# Patient Record
Sex: Female | Born: 1935
Health system: Southern US, Community
[De-identification: ages and names within clinical notes are randomized; demographics above are authoritative.]

## PROBLEM LIST (undated history)

## (undated) ENCOUNTER — Emergency Department (HOSPITAL_COMMUNITY): Admission: EM | Payer: Medicare Other | Source: Home / Self Care

## (undated) ENCOUNTER — Emergency Department (HOSPITAL_BASED_OUTPATIENT_CLINIC_OR_DEPARTMENT_OTHER): Payer: Managed Care, Other (non HMO) | Source: Home / Self Care

## (undated) DIAGNOSIS — R2681 Unsteadiness on feet: Secondary | ICD-10-CM

## (undated) DIAGNOSIS — K219 Gastro-esophageal reflux disease without esophagitis: Secondary | ICD-10-CM

## (undated) DIAGNOSIS — R1110 Vomiting, unspecified: Secondary | ICD-10-CM

## (undated) DIAGNOSIS — H5462 Unqualified visual loss, left eye, normal vision right eye: Secondary | ICD-10-CM

## (undated) DIAGNOSIS — C801 Malignant (primary) neoplasm, unspecified: Secondary | ICD-10-CM

## (undated) DIAGNOSIS — I5042 Chronic combined systolic (congestive) and diastolic (congestive) heart failure: Secondary | ICD-10-CM

## (undated) DIAGNOSIS — M1990 Unspecified osteoarthritis, unspecified site: Secondary | ICD-10-CM

## (undated) DIAGNOSIS — N390 Urinary tract infection, site not specified: Secondary | ICD-10-CM

## (undated) DIAGNOSIS — H3320 Serous retinal detachment, unspecified eye: Secondary | ICD-10-CM

## (undated) DIAGNOSIS — M2550 Pain in unspecified joint: Secondary | ICD-10-CM

## (undated) DIAGNOSIS — I219 Acute myocardial infarction, unspecified: Secondary | ICD-10-CM

## (undated) DIAGNOSIS — E039 Hypothyroidism, unspecified: Secondary | ICD-10-CM

## (undated) DIAGNOSIS — I440 Atrioventricular block, first degree: Secondary | ICD-10-CM

## (undated) DIAGNOSIS — I2510 Atherosclerotic heart disease of native coronary artery without angina pectoris: Secondary | ICD-10-CM

## (undated) DIAGNOSIS — E669 Obesity, unspecified: Secondary | ICD-10-CM

## (undated) DIAGNOSIS — E7800 Pure hypercholesterolemia, unspecified: Secondary | ICD-10-CM

## (undated) DIAGNOSIS — I10 Essential (primary) hypertension: Secondary | ICD-10-CM

## (undated) DIAGNOSIS — M791 Myalgia, unspecified site: Secondary | ICD-10-CM

## (undated) DIAGNOSIS — I119 Hypertensive heart disease without heart failure: Secondary | ICD-10-CM

## (undated) DIAGNOSIS — I77810 Thoracic aortic ectasia: Secondary | ICD-10-CM

## (undated) DIAGNOSIS — C50919 Malignant neoplasm of unspecified site of unspecified female breast: Secondary | ICD-10-CM

## (undated) DIAGNOSIS — R32 Unspecified urinary incontinence: Secondary | ICD-10-CM

## (undated) DIAGNOSIS — K922 Gastrointestinal hemorrhage, unspecified: Secondary | ICD-10-CM

## (undated) HISTORY — PX: NECK SURGERY: SHX720

## (undated) HISTORY — DX: Unsteadiness on feet: R26.81

## (undated) HISTORY — DX: Unspecified osteoarthritis, unspecified site: M19.90

## (undated) HISTORY — DX: Hypertensive heart disease without heart failure: I11.9

## (undated) HISTORY — PX: CATARACT EXTRACTION, BILATERAL: SHX1313

## (undated) HISTORY — DX: Obesity, unspecified: E66.9

## (undated) HISTORY — DX: Malignant (primary) neoplasm, unspecified: C80.1

## (undated) HISTORY — PX: BREAST BIOPSY: SHX20

## (undated) HISTORY — DX: Pure hypercholesterolemia, unspecified: E78.00

## (undated) HISTORY — DX: Pain in unspecified joint: M25.50

## (undated) HISTORY — DX: Urinary tract infection, site not specified: N39.0

## (undated) HISTORY — PX: FOOT SURGERY: SHX648

## (undated) HISTORY — DX: Serous retinal detachment, unspecified eye: H33.20

## (undated) HISTORY — DX: Hypothyroidism, unspecified: E03.9

## (undated) HISTORY — DX: Gastrointestinal hemorrhage, unspecified: K92.2

## (undated) HISTORY — DX: Gastro-esophageal reflux disease without esophagitis: K21.9

## (undated) HISTORY — PX: BREAST LUMPECTOMY: SHX2

## (undated) HISTORY — PX: CERVICAL LAMINECTOMY: SHX94

## (undated) HISTORY — DX: Myalgia, unspecified site: M79.10

## (undated) HISTORY — DX: Essential (primary) hypertension: I10

## (undated) HISTORY — DX: Vomiting, unspecified: R11.10

---
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
## 1975-03-14 HISTORY — PX: ABDOMINAL HYSTERECTOMY: SHX81

## 1978-03-13 DIAGNOSIS — K922 Gastrointestinal hemorrhage, unspecified: Secondary | ICD-10-CM

## 1978-03-13 HISTORY — DX: Gastrointestinal hemorrhage, unspecified: K92.2

## 1998-12-21 ENCOUNTER — Ambulatory Visit (HOSPITAL_COMMUNITY): Admission: RE | Admit: 1998-12-21 | Discharge: 1998-12-21 | Payer: Self-pay | Admitting: Gastroenterology

## 1999-10-20 ENCOUNTER — Other Ambulatory Visit: Admission: RE | Admit: 1999-10-20 | Discharge: 1999-10-20 | Payer: Self-pay | Admitting: Internal Medicine

## 2002-02-20 ENCOUNTER — Ambulatory Visit (HOSPITAL_COMMUNITY): Admission: RE | Admit: 2002-02-20 | Discharge: 2002-02-20 | Payer: Self-pay | Admitting: Internal Medicine

## 2003-12-09 ENCOUNTER — Ambulatory Visit (HOSPITAL_BASED_OUTPATIENT_CLINIC_OR_DEPARTMENT_OTHER): Admission: RE | Admit: 2003-12-09 | Discharge: 2003-12-09 | Payer: Self-pay | Admitting: General Surgery

## 2003-12-09 ENCOUNTER — Encounter (INDEPENDENT_AMBULATORY_CARE_PROVIDER_SITE_OTHER): Payer: Self-pay | Admitting: *Deleted

## 2003-12-09 ENCOUNTER — Ambulatory Visit (HOSPITAL_COMMUNITY): Admission: RE | Admit: 2003-12-09 | Discharge: 2003-12-09 | Payer: Self-pay | Admitting: General Surgery

## 2004-11-22 ENCOUNTER — Encounter: Admission: RE | Admit: 2004-11-22 | Discharge: 2004-11-22 | Payer: Self-pay | Admitting: Specialist

## 2004-12-09 ENCOUNTER — Encounter: Admission: RE | Admit: 2004-12-09 | Discharge: 2004-12-09 | Payer: Self-pay | Admitting: Specialist

## 2005-01-10 ENCOUNTER — Encounter: Admission: RE | Admit: 2005-01-10 | Discharge: 2005-01-10 | Payer: Self-pay | Admitting: Specialist

## 2005-12-21 ENCOUNTER — Encounter: Admission: RE | Admit: 2005-12-21 | Discharge: 2005-12-21 | Payer: Self-pay | Admitting: Internal Medicine

## 2008-10-29 DIAGNOSIS — M169 Osteoarthritis of hip, unspecified: Secondary | ICD-10-CM | POA: Insufficient documentation

## 2008-10-29 DIAGNOSIS — E039 Hypothyroidism, unspecified: Secondary | ICD-10-CM | POA: Insufficient documentation

## 2008-10-29 DIAGNOSIS — I10 Essential (primary) hypertension: Secondary | ICD-10-CM | POA: Insufficient documentation

## 2008-10-29 DIAGNOSIS — K219 Gastro-esophageal reflux disease without esophagitis: Secondary | ICD-10-CM | POA: Insufficient documentation

## 2009-01-02 ENCOUNTER — Encounter: Admission: RE | Admit: 2009-01-02 | Discharge: 2009-01-02 | Payer: Self-pay | Admitting: Specialist

## 2010-03-13 HISTORY — PX: TOTAL HIP ARTHROPLASTY: SHX124

## 2010-06-22 ENCOUNTER — Encounter: Payer: Self-pay | Admitting: Cardiology

## 2010-06-22 DIAGNOSIS — N390 Urinary tract infection, site not specified: Secondary | ICD-10-CM | POA: Insufficient documentation

## 2010-06-22 DIAGNOSIS — E663 Overweight: Secondary | ICD-10-CM | POA: Insufficient documentation

## 2010-06-22 DIAGNOSIS — H3320 Serous retinal detachment, unspecified eye: Secondary | ICD-10-CM | POA: Insufficient documentation

## 2010-06-22 DIAGNOSIS — M791 Myalgia, unspecified site: Secondary | ICD-10-CM | POA: Insufficient documentation

## 2010-06-22 DIAGNOSIS — M1990 Unspecified osteoarthritis, unspecified site: Secondary | ICD-10-CM | POA: Insufficient documentation

## 2010-06-22 DIAGNOSIS — R1110 Vomiting, unspecified: Secondary | ICD-10-CM | POA: Insufficient documentation

## 2010-06-22 DIAGNOSIS — M2550 Pain in unspecified joint: Secondary | ICD-10-CM | POA: Insufficient documentation

## 2010-06-22 DIAGNOSIS — R2681 Unsteadiness on feet: Secondary | ICD-10-CM | POA: Insufficient documentation

## 2010-06-22 DIAGNOSIS — I119 Hypertensive heart disease without heart failure: Secondary | ICD-10-CM | POA: Insufficient documentation

## 2010-06-22 DIAGNOSIS — E7800 Pure hypercholesterolemia, unspecified: Secondary | ICD-10-CM | POA: Insufficient documentation

## 2010-06-22 DIAGNOSIS — K922 Gastrointestinal hemorrhage, unspecified: Secondary | ICD-10-CM | POA: Insufficient documentation

## 2010-06-22 DIAGNOSIS — E039 Hypothyroidism, unspecified: Secondary | ICD-10-CM | POA: Insufficient documentation

## 2010-06-22 DIAGNOSIS — I110 Hypertensive heart disease with heart failure: Secondary | ICD-10-CM | POA: Insufficient documentation

## 2010-06-22 DIAGNOSIS — K219 Gastro-esophageal reflux disease without esophagitis: Secondary | ICD-10-CM | POA: Insufficient documentation

## 2010-06-23 ENCOUNTER — Encounter: Payer: Self-pay | Admitting: Cardiology

## 2010-06-23 ENCOUNTER — Ambulatory Visit (INDEPENDENT_AMBULATORY_CARE_PROVIDER_SITE_OTHER): Payer: Medicare Other | Admitting: Cardiology

## 2010-06-23 DIAGNOSIS — R0609 Other forms of dyspnea: Secondary | ICD-10-CM

## 2010-06-23 DIAGNOSIS — E7800 Pure hypercholesterolemia, unspecified: Secondary | ICD-10-CM

## 2010-06-23 DIAGNOSIS — R0600 Dyspnea, unspecified: Secondary | ICD-10-CM

## 2010-06-23 DIAGNOSIS — I119 Hypertensive heart disease without heart failure: Secondary | ICD-10-CM

## 2010-06-23 DIAGNOSIS — I10 Essential (primary) hypertension: Secondary | ICD-10-CM

## 2010-06-23 NOTE — Assessment & Plan Note (Signed)
Overall, from a cardiology standpoint she's been doing well. She's taking diclofenac for her pain in her back and hip and I expect that is aggravating her hypertension. I advised caution. In general, she's been doing well.

## 2010-06-23 NOTE — Assessment & Plan Note (Signed)
Simvastatin has been stopped, followup per Dr. Jacky Kindle.

## 2010-06-23 NOTE — Progress Notes (Signed)
Subjective:   Kathy Murphy comes in today for followup visit. Her main issue is spinal stenosis and left hip pain. She's had right foot surgery since she was seen. She sleep deprived because of recurrent nocturia.  From a cardiology standpoint, she's had a hypertensive heart disease and hypercholesterolemia. Blood pressure in general has been well controlled.  Other medical problems include hypertensive heart disease, hypercholesterolemia, a history of detached retina, hypothyroidism, gastroesophageal reflux, and obesity. She's had a thyroidectomy for cancer in 1957 and a cervical laminectomy. Should history of GI bleed after polyp excision in 1980.  Current Outpatient Prescriptions  Medication Sig Dispense Refill  . aspirin 81 MG tablet Take 81 mg by mouth daily.        . diazepam (VALIUM) 2 MG tablet Take 2 mg by mouth every 6 (six) hours as needed.        . Diclofenac Sodium CR 100 MG 24 hr tablet Take 100 mg by mouth daily.        Marland Kitchen estrogens, conjugated, (PREMARIN) 0.9 MG tablet Take 0.9 mg by mouth daily. Take daily for 21 days then do not take for 7 days.       Marland Kitchen levothyroxine (SYNTHROID, LEVOTHROID) 137 MCG tablet Take 137 mcg by mouth daily.        Marland Kitchen losartan-hydrochlorothiazide (HYZAAR) 50-12.5 MG per tablet Take 1 tablet by mouth daily.        . nebivolol (BYSTOLIC) 10 MG tablet Take 10 mg by mouth daily.        . pantoprazole (PROTONIX) 40 MG tablet Take 40 mg by mouth daily.          No Known Allergies  Patient Active Problem List  Diagnoses  . Heart disease, hypertensive  . Hypercholesterolemia  . Arthritis  . Unsteady gait  . Myalgia  . Pain in joints  . Retinal detachment  . Hypothyroidism  . GERD (gastroesophageal reflux disease)  . UTI (urinary tract infection)  . Vomiting  . Obesity  . GI bleed    History  Smoking status  . Never Smoker   Smokeless tobacco  . Not on file    History  Alcohol Use No    No family history on file.  Review of Systems:   The  patient denies any heat or cold intolerance.  No weight gain or weight loss.  The patient denies headaches or blurry vision.  There is no cough or sputum production.  The patient denies dizziness.  There is no hematuria or hematochezia.  The patient denies any muscle aches or arthritis.  The patient denies any rash.  The patient denies frequent falling or instability.  There is no history of depression or anxiety.  All other systems were reviewed and are negative.   Physical Exam:    Assessment / Plan:

## 2010-06-30 ENCOUNTER — Encounter: Payer: Self-pay | Admitting: Cardiology

## 2010-06-30 ENCOUNTER — Ambulatory Visit (HOSPITAL_COMMUNITY): Payer: Medicare Other | Attending: Cardiology | Admitting: Radiology

## 2010-06-30 VITALS — Ht 69.0 in | Wt 192.0 lb

## 2010-06-30 DIAGNOSIS — R0609 Other forms of dyspnea: Secondary | ICD-10-CM | POA: Insufficient documentation

## 2010-06-30 DIAGNOSIS — I440 Atrioventricular block, first degree: Secondary | ICD-10-CM

## 2010-06-30 DIAGNOSIS — I4949 Other premature depolarization: Secondary | ICD-10-CM

## 2010-06-30 DIAGNOSIS — R0600 Dyspnea, unspecified: Secondary | ICD-10-CM

## 2010-06-30 DIAGNOSIS — R0602 Shortness of breath: Secondary | ICD-10-CM

## 2010-06-30 DIAGNOSIS — R0789 Other chest pain: Secondary | ICD-10-CM

## 2010-06-30 DIAGNOSIS — R0989 Other specified symptoms and signs involving the circulatory and respiratory systems: Secondary | ICD-10-CM | POA: Insufficient documentation

## 2010-06-30 MED ORDER — TECHNETIUM TC 99M TETROFOSMIN IV KIT
11.0000 | PACK | Freq: Once | INTRAVENOUS | Status: AC | PRN
  Administered 2010-06-30: 11 via INTRAVENOUS

## 2010-06-30 MED ORDER — TECHNETIUM TC 99M TETROFOSMIN IV KIT
33.0000 | PACK | Freq: Once | INTRAVENOUS | Status: AC | PRN
  Administered 2010-06-30: 33 via INTRAVENOUS

## 2010-06-30 MED ORDER — REGADENOSON 0.4 MG/5ML IV SOLN
0.4000 mg | Freq: Once | INTRAVENOUS | Status: AC
  Administered 2010-06-30: 0.4 mg via INTRAVENOUS

## 2010-06-30 NOTE — Progress Notes (Signed)
[facility] SITE 3 NUCLEAR MED [address] [place] [address] [phone]  Cardiology Nuclear Med Study  Kathy Murphy is a 77 y.o. female [id number] 08-20-1927   Nuclear Med Background Indication for Stress Test:  Evaluation for Ischemia History:  No previous documented CAD Cardiac Risk Factors: Hypertension, Lipids and Obesity  Symptoms:  Chest Pressure.  (last date of chest discomfort- months ago)   Nuclear Pre-Procedure Caffeine/Decaff Intake:  None NPO After: 11:30pm   Lungs:  clear IV 0.9% NS with Angio Cath:  22g  IV Site: R Hand  IV Started by:  Bonnita Levan, RN  Chest Size (in):  40 Cup Size: C  Height: 5\' 9"  (1.753 m)  Weight:  192 lb (87.091 kg)  BMI:  Body mass index is 28.35 kg/(m^2). Tech Comments:  Public librarian Med Study 1 or 2 day study: 1 day  Stress Test Type:  Lexiscan  Reading MD: Olga Millers, MD  Order Authorizing Provider:  Dr. Roger Shelter  Resting Radionuclide: Technetium 41m Tetrofosmin  Resting Radionuclide Dose: 11.0 mCi   Stress Radionuclide:  Technetium 22m Tetrofosmin  Stress Radionuclide Dose: 33.0 mCi           Stress Protocol Rest HR: 65 Stress HR: 90  Rest BP: 162/94 Stress BP: 146/63  Exercise Time (min): n/a METS: n/a     % Max HR: 64.29 bpm    Dose of Adenosine (mg):  n/a Dose of Lexiscan: 0.4 mg  Dose of Atropine (mg): n/a Dose of Dobutamine: n/a mcg/kg/min (at max HR)  Stress Test Technologist: Frederick Peers, EMT-P  Nuclear Technologist:  Doyne Keel, CNMT     Rest Procedure:  Myocardial perfusion imaging was performed at rest 45 minutes following the intravenous administration of Technetium 53m Tetrofosmin. Rest ECG: SR 1AVB  Stress Procedure:  The patient received IV Lexiscan 0.4 mg over 15-seconds.  Technetium 61m Tetrofosmin injected at 30-seconds.  There were no significant changes with Lexiscan/pt with frequent PVCs.  Quantitative spect images were obtained after a 45  minute delay. Stress ECG: No significant ST segment change suggestive of ischemia.  QPS Raw Data Images:  Acquisition technically good; normal left ventricular size. Stress Images:  There is decreased uptake in the apex. Rest Images:  There is decreased uptake in the apex. Subtraction (SDS):  No evidence of ischemia. Transient Ischemic Dilatation (Normal <1.22):  0.93 Lung/Heart Ratio (Normal <0.45):  0.32   Quantitative Gated Spect Images QGS EDV:  94 ml QGS ESV:  28 ml QGS cine images:  Normal Wall Motion QGS EF: 70%  Impression Exercise Capacity:  Lexiscan with no exercise. BP Response:  Normal blood pressure response. Clinical Symptoms:  No chest pain. ECG Impression:  No significant ST segment change suggestive of ischemia. Comparison with Prior Nuclear Study: No images to compare  Overall Impression:  Normal stress nuclear study with apical thinning but no ischemia.    Olga Millers

## 2010-07-04 ENCOUNTER — Encounter (HOSPITAL_COMMUNITY): Payer: Self-pay | Admitting: Cardiology

## 2010-07-04 NOTE — Progress Notes (Signed)
COPY SENT TO DR. Deborah Chalk.Mirna Mires

## 2010-07-05 NOTE — Progress Notes (Signed)
Normal study

## 2010-07-08 ENCOUNTER — Telehealth: Payer: Self-pay | Admitting: *Deleted

## 2010-07-08 NOTE — Telephone Encounter (Signed)
Pt notified of nuclear results.   

## 2010-07-08 NOTE — Progress Notes (Signed)
Pt notified of nuclear results.   

## 2010-07-29 NOTE — Op Note (Signed)
NAME:  Kathy Murphy, Kathy Murphy NO.:  [id number]   MEDICAL RECORD NO.:  [id number]          PATIENT TYPE:  AMB   LOCATION:  DSC                          FACILITY:  [facility]   PHYSICIAN:  Rose Phi. Maple Hudson, M.D.   DATE OF BIRTH:  08-09-1927   DATE OF PROCEDURE:  12/09/2003  DATE OF DISCHARGE:                                 OPERATIVE REPORT   PREOPERATIVE DIAGNOSIS:  Ductal papilloma of the right breast.   POSTOPERATIVE DIAGNOSIS:  Ductal papilloma of the right breast.   OPERATION PERFORMED:  Excision of ductal papilloma of the right breast.   SURGEON:  Rose Phi. Maple Hudson, M.D.   ANESTHESIA:  MAC.   DESCRIPTION OF PROCEDURE:  Patient placed on the operating table with the  arms extended on the arm board and the right breast prepped and draped in  the usual fashion.  The nodule with the area of discharge was centered at  about the 5:30 position along the areolar margin.  A circumareolar incision  centered on that position was then outlined with a marking pencil and the  area thoroughly infiltrated with a local anesthetic mixture.   The incision was made and then I raised an areolar flap up to where the duct  entered the nipple where there was some discoloration.  We then excised this  area.  Hemostasis was obtained with a cautery.  Subcuticular closure with 4-  0 Monocryl and skin glue was then carried out.  Dressing applied.  The  patient was then transferred to the recovery room in satisfactory condition  having tolerated the procedure well.      Pete   PRY/MEDQ  D:  12/09/2003  T:  12/09/2003  Job:  [id number]

## 2010-08-15 ENCOUNTER — Other Ambulatory Visit: Payer: Self-pay | Admitting: Internal Medicine

## 2010-08-15 DIAGNOSIS — Q762 Congenital spondylolisthesis: Secondary | ICD-10-CM | POA: Insufficient documentation

## 2010-08-19 ENCOUNTER — Ambulatory Visit
Admission: RE | Admit: 2010-08-19 | Discharge: 2010-08-19 | Disposition: A | Discharge status: Home / Self Care | Payer: Medicare Other | Source: Ambulatory Visit | Attending: Internal Medicine | Admitting: Internal Medicine

## 2010-08-19 DIAGNOSIS — Q762 Congenital spondylolisthesis: Secondary | ICD-10-CM

## 2010-09-08 ENCOUNTER — Other Ambulatory Visit: Payer: Self-pay | Admitting: Internal Medicine

## 2010-09-08 DIAGNOSIS — R9389 Abnormal findings on diagnostic imaging of other specified body structures: Secondary | ICD-10-CM

## 2010-09-09 ENCOUNTER — Ambulatory Visit
Admission: RE | Admit: 2010-09-09 | Discharge: 2010-09-09 | Disposition: A | Discharge status: Home / Self Care | Payer: Medicare Other | Source: Ambulatory Visit | Attending: Internal Medicine | Admitting: Internal Medicine

## 2010-09-09 DIAGNOSIS — R9389 Abnormal findings on diagnostic imaging of other specified body structures: Secondary | ICD-10-CM

## 2010-09-09 MED ORDER — IOHEXOL 300 MG/ML  SOLN
75.0000 mL | Freq: Once | INTRAMUSCULAR | Status: AC | PRN
  Administered 2010-09-09: 75 mL via INTRAVENOUS

## 2010-11-28 ENCOUNTER — Other Ambulatory Visit: Payer: Self-pay | Admitting: Orthopaedic Surgery

## 2010-11-28 ENCOUNTER — Encounter (HOSPITAL_COMMUNITY): Payer: Medicare Other

## 2010-11-28 LAB — URINALYSIS, ROUTINE W REFLEX MICROSCOPIC
Glucose, UA: NEGATIVE mg/dL
Ketones, ur: NEGATIVE mg/dL
Leukocytes, UA: NEGATIVE
Nitrite: NEGATIVE
Protein, ur: NEGATIVE mg/dL
Specific Gravity, Urine: 1.03 (ref 1.005–1.030)
Urobilinogen, UA: 0.2 mg/dL (ref 0.0–1.0)
pH: 5 (ref 5.0–8.0)

## 2010-11-28 LAB — CBC
HCT: 41.3 % (ref 36.0–46.0)
Hemoglobin: 13.4 g/dL (ref 12.0–15.0)
MCH: 30 pg (ref 26.0–34.0)
MCHC: 32.4 g/dL (ref 30.0–36.0)
MCV: 92.6 fL (ref 78.0–100.0)
Platelets: 234 10*3/uL (ref 150–400)
RBC: 4.46 MIL/uL (ref 3.87–5.11)
RDW: 13.1 % (ref 11.5–15.5)
WBC: 9.6 10*3/uL (ref 4.0–10.5)

## 2010-11-28 LAB — SURGICAL PCR SCREEN
MRSA, PCR: NEGATIVE
Staphylococcus aureus: POSITIVE — AB

## 2010-11-28 LAB — PROTIME-INR
INR: 0.96 (ref 0.00–1.49)
Prothrombin Time: 13 seconds (ref 11.6–15.2)

## 2010-11-28 LAB — BASIC METABOLIC PANEL
BUN: 15 mg/dL (ref 6–23)
CO2: 30 mEq/L (ref 19–32)
Calcium: 9.5 mg/dL (ref 8.4–10.5)
Chloride: 94 mEq/L — ABNORMAL LOW (ref 96–112)
Creatinine, Ser: 0.9 mg/dL (ref 0.50–1.10)
GFR calc Af Amer: >60 mL/min (ref >60)
GFR calc non Af Amer: >60 mL/min (ref >60)
Glucose, Bld: 91 mg/dL (ref 70–99)
Potassium: 4.3 mEq/L (ref 3.5–5.1)
Sodium: 134 mEq/L — ABNORMAL LOW (ref 135–145)

## 2010-11-28 LAB — URINE MICROSCOPIC-ADD ON

## 2010-12-02 ENCOUNTER — Inpatient Hospital Stay (HOSPITAL_COMMUNITY)
Admission: RE | Admit: 2010-12-02 | Discharge: 2010-12-05 | DRG: 470 | Disposition: A | Discharge status: Home Health | Payer: Medicare Other | Source: Ambulatory Visit | Attending: Orthopaedic Surgery | Admitting: Orthopaedic Surgery

## 2010-12-02 ENCOUNTER — Inpatient Hospital Stay (HOSPITAL_COMMUNITY): Payer: Medicare Other

## 2010-12-02 DIAGNOSIS — Z79899 Other long term (current) drug therapy: Secondary | ICD-10-CM

## 2010-12-02 DIAGNOSIS — Z01812 Encounter for preprocedural laboratory examination: Secondary | ICD-10-CM

## 2010-12-02 DIAGNOSIS — M169 Osteoarthritis of hip, unspecified: Principal | ICD-10-CM | POA: Diagnosis present

## 2010-12-02 DIAGNOSIS — M1610 Unilateral primary osteoarthritis, unspecified hip: Principal | ICD-10-CM | POA: Diagnosis present

## 2010-12-02 DIAGNOSIS — Z7982 Long term (current) use of aspirin: Secondary | ICD-10-CM

## 2010-12-02 DIAGNOSIS — Z91040 Latex allergy status: Secondary | ICD-10-CM

## 2010-12-02 DIAGNOSIS — Z79890 Hormone replacement therapy (postmenopausal): Secondary | ICD-10-CM

## 2010-12-02 DIAGNOSIS — I10 Essential (primary) hypertension: Secondary | ICD-10-CM | POA: Diagnosis present

## 2010-12-02 LAB — ABO/RH: ABO/RH(D): O NEG

## 2010-12-02 LAB — TYPE AND SCREEN
ABO/RH(D): O NEG
Antibody Screen: NEGATIVE

## 2010-12-03 LAB — CBC
MCV: 92.9 fL (ref 78.0–100.0)
Platelets: 213 10*3/uL (ref 150–400)
RBC: 3.52 MIL/uL — ABNORMAL LOW (ref 3.87–5.11)
RDW: 13.5 % (ref 11.5–15.5)
WBC: 12.6 10*3/uL — ABNORMAL HIGH (ref 4.0–10.5)

## 2010-12-03 LAB — BASIC METABOLIC PANEL
CO2: 27 mEq/L (ref 19–32)
Chloride: 95 mEq/L — ABNORMAL LOW (ref 96–112)
Creatinine, Ser: 0.65 mg/dL (ref 0.50–1.10)
GFR calc Af Amer: >60 mL/min (ref >60)
Potassium: 3.9 mEq/L (ref 3.5–5.1)
Sodium: 133 mEq/L — ABNORMAL LOW (ref 135–145)

## 2010-12-04 LAB — CBC
HCT: 27.8 % — ABNORMAL LOW (ref 36.0–46.0)
Hemoglobin: 9.1 g/dL — ABNORMAL LOW (ref 12.0–15.0)
MCV: 92.7 fL (ref 78.0–100.0)
RBC: 3 MIL/uL — ABNORMAL LOW (ref 3.87–5.11)
RDW: 13.6 % (ref 11.5–15.5)
WBC: 10.9 10*3/uL — ABNORMAL HIGH (ref 4.0–10.5)

## 2010-12-04 LAB — BASIC METABOLIC PANEL
BUN: 12 mg/dL (ref 6–23)
CO2: 31 mEq/L (ref 19–32)
Chloride: 94 mEq/L — ABNORMAL LOW (ref 96–112)
Creatinine, Ser: 0.71 mg/dL (ref 0.50–1.10)
GFR calc Af Amer: >60 mL/min (ref >60)
Potassium: 3.2 mEq/L — ABNORMAL LOW (ref 3.5–5.1)

## 2010-12-05 LAB — BASIC METABOLIC PANEL
BUN: 12 mg/dL (ref 6–23)
Chloride: 94 mEq/L — ABNORMAL LOW (ref 96–112)
Creatinine, Ser: 0.62 mg/dL (ref 0.50–1.10)
Glucose, Bld: 113 mg/dL — ABNORMAL HIGH (ref 70–99)
Potassium: 3.3 mEq/L — ABNORMAL LOW (ref 3.5–5.1)

## 2010-12-05 LAB — CBC
HCT: 28.4 % — ABNORMAL LOW (ref 36.0–46.0)
Hemoglobin: 9.4 g/dL — ABNORMAL LOW (ref 12.0–15.0)
MCHC: 33.1 g/dL (ref 30.0–36.0)
MCV: 92.5 fL (ref 78.0–100.0)

## 2010-12-05 NOTE — Op Note (Signed)
NAMEGERILYNN, MCCULLARS NO.:  [id number]  MEDICAL RECORD NO.:  [id number]  LOCATION:  0007                         FACILITY:  [facility]  PHYSICIAN:  Vanita Panda. Magnus Ivan, M.D.DATE OF BIRTH:  [birth year]  DATE OF PROCEDURE:  12/02/2010 DATE OF DISCHARGE:                              OPERATIVE REPORT   PREOPERATIVE DIAGNOSIS:  Severe osteoarthritis and degenerative joint disease, left hip.  POSTOPERATIVE DIAGNOSIS:  Severe osteoarthritis and degenerative joint disease, left hip.  PROCEDURE:  Left total hip arthroplasty through direct anterior approach.  IMPLANTS:  DePuy Pinn[place] Sector acetabular component, size 52; neutral +4, size 36 polyethylene liner; Corail femoral component with HA- coating, size 10 and standard offset, size 36 plus 1.5 metal head ball.  SURGEON:  Vanita Panda. Magnus Ivan, MD  ANESTHESIA:  General.  ANTIBIOTICS:  IV Ancef 2 gm.  BLOOD LOSS:  350 cc.  COMPLICATIONS:  None.  INDICATIONS:  Kathy Murphy is a very active 77 year old female with severe end-stage arthritis of her left hip.  This has been confirmed on x-rays and shows bone-on-bone wear and she has debilitating pain from this.  Due to the nature of this disease, she wishes to proceed with a total hip arthroplasty.  She understands the risks and benefits of this in detail and does wish to proceed with surgery.  PROCEDURE DESCRIPTION:  After informed consent was obtained, the appropriate left hip was marked.  She was brought to the operating room. General anesthesia was obtained while she was on the stretcher.  A Foley catheter was placed and then her feet were placed in traction boots for the Hana fracture table.  She was then placed on this table and a perineal post was placed as well.  The legs were placed in the traction devices with no traction applied.  The left hip was then prepped and draped with DuraPrep and sterile drapes.  A time-out was called and she was  identified as the correct patient and correct left hip.  I then made an incision 1 cm distal and 3 cm posterior to the anterior superior iliac spine and carried this obliquely down the leg.  I  dissected down to the tensor fascia lata and I divided the tensor fascia lata longitudinally.  I then proceeded with a direct anterior approach to the hip.  A Cobra retractor was placed around the lateral neck and then 1 medial teased up underneath the rectus femoris.  I then divided the hip capsule and placed retractors within the hip capsule.  Using an oscillating saw, a femoral neck cut was then made proximal to the lesser trochanter.  I then placed a bent Hohmann anteriorly and rectractor posteriorly to gain access to the acetabulum once the femoral head was removed, there was severe arthritis of this.  I then cleaned the acetabulum debris as well as remnants of the labrum and began reaming from size 44 up to size 51 with the last 2 reamers placed under direct fluoroscopic guidance.  I then chose a 52 acetabular component with Gription and placed this under direct fluoroscopic guidance and attempted placing this into the acetabulum and we had a solid fit. Next, attention was turned to  the femur.  We tried to once again __________ the leg and a temporary hook was placed underneath the vastus ridge and the greater trochanter.  The leg was externally rotated to 90 degrees, extended and abducted.  This allowed access to the femoral canal.  Retractors were placed medial to the calcar as well as under the greater trochanter.  I released a tissue to gain exposure and then used a box cutting guide followed by broaches and broached it up to a size 10 broach, it was felt to be solid.  I then placed a standard neck and a +1.5, 36 hip ball and reduced this into the acetabulum, bringing the leg back up and over.  There was minimal shock.  I could externally rotate her, internally rotate her, and it was  stable.  Her leg lengths were measured to be equal fluoroscopically.  I then removed the trial components and placed the real femoral component and the real 36+ 1.5 hip ball without problems and reduced this again in the acetabulum and once again, under radiographic guidance, it was felt the leg lengths were equal and her offset was equal.  I then copiously irrigated the tissue and closed the joint capsule with interrupted #1 Ethibond suture followed by a running #1 Vicryl in the tensor fascia lata.  The subcutaneous tissue was closed with interrupted 2-0 Vicryl followed by staples on the skin and a well-padded sterile dressing.  The patient was awakened, extubated, and taken off the Hana table into the recovery room in stable condition.  All final counts were correct and there were no complications noted.     Vanita Panda. Magnus Ivan, M.D.     CYB/MEDQ  D:  12/02/2010  T:  12/02/2010  Job:  [id number]  Electronically Signed by Doneen Poisson M.D. on 12/05/2010 07:13:20 PM

## 2010-12-05 NOTE — H&P (Signed)
  NAMEANN, BOHNE NO.:  [id number]  MEDICAL RECORD NO.:  [id number]  LOCATION:  1605                         FACILITY:  [facility]  PHYSICIAN:  Vanita Panda. Magnus Ivan, M.D.DATE OF BIRTH:  August 23, 1927  DATE OF ADMISSION:  12/02/2010 DATE OF DISCHARGE:                             HISTORY & PHYSICAL   CHIEF COMPLAINT:  Severe left hip pain.  HISTORY OF PRESENT ILLNESS:  Ms. Bumgardner is an 77 year old pleasant female with end-stage arthritis of the left hip.  X-rays confirmed bone- on-bone wearing with loss of her joint space.  She ambulates with a cane.  She has tried everything to get this feeling better and she has not.  She wishes to proceed with a total hip arthroplasty at this standpoint.  The risks and benefits of this have been explained to her in detail and she does wish to proceed with surgery.  PAST MEDICAL HISTORY: 1. Hypertension. 2. Thyroid disease. 3. Severe arthritis.  MEDICATIONS: 1. Synthroid. 2. Artificial tears. 3. Diclofenac. 4. Vicodin. 5. Aspirin. 6. Losartan/hydrochlorothiazide. 7. Bystolic. 8. Pantoprazole. 9. Premarin.  ALLERGIES:  No known drug allergies.  SOCIAL HISTORY:  She lives with her husband.  She does not smoke.  She is a very active individual.  REVIEW OF SYSTEMS:  Negative for chest pain, shortness of breath, fever, chills, nausea, and vomiting.  PHYSICAL EXAMINATION:  VITAL SIGNS:  She is afebrile and her vitals can be seen on her chart. GENERAL:  She is alert and oriented x3, in no acute distress. HEENT:  Normocephalic, atraumatic.  Pupils equal, round, and reactive to light.  Extraocular movements are intact. NECK:  Supple.  No JVD.  No bruits. LUNGS:  Clear to auscultation bilaterally. HEART:  Regular rate and rhythm. ABDOMEN:  Benign. EXTREMITIES:  Left hip shows severe pain with internal and external rotation.  X-rays confirm end-stage arthritis of her left hip.  ASSESSMENT:  This is an 77 year old  female with severe debilitating arthritis of her left hip.  PLAN:  We are proceed today with a direct anterior hip replacement.  She understands the risks and benefits of this in detail and does wish to proceed with surgery.     Vanita Panda. Magnus Ivan, M.D.     CYB/MEDQ  D:  12/02/2010  T:  12/02/2010  Job:  [id number]  Electronically Signed by Doneen Poisson M.D. on 12/05/2010 07:13:16 PM

## 2010-12-05 NOTE — Discharge Summary (Signed)
  NAMECATALINA, Kathy Murphy NO.:  [id number]  MEDICAL RECORD NO.:  [id number]  LOCATION:  1605                         FACILITY:  [facility]  PHYSICIAN:  Vanita Panda. Magnus Ivan, M.D.DATE OF BIRTH:  1927/08/13  DATE OF ADMISSION:  12/02/2010 DATE OF DISCHARGE:  12/05/2010                              DISCHARGE SUMMARY   ADMITTING DIAGNOSES:  Severe degenerative joint disease and osteoarthritis, left hip.  DISCHARGE DIAGNOSES:  Severe degenerative joint disease and osteoarthritis, left hip.  PROCEDURES:  Left total hip arthroplasty on December 02, 2010.  HOSPITAL COURSE:  [name] is an 77 year old female with debilitating arthritis of her left hip.  She was taken to the operating room on day of admission and underwent a left total hip arthroplasty through direct anterior approach.  She was then admitted as an inpatient to orthopedic floor.  Her hospital course was uneventful.  DISPOSITION:  Discharged to home.  DISCHARGE MEDICATIONS: 1. Percocet. 2. Robaxin. 3. Xarelto.  Home medications to continue, 1. Synthroid. 2. Artificial tears. 3. Losartan/hydrochlorothiazide. 4. Bystolic. 5. Pantoprazole. 6. Premarin.  DISCHARGE INSTRUCTIONS:  While she is at home, she will wait 3 days before getting her incisions wet in shower.  She will work with home health therapy for balance, gait training, co-ordination, and hip precautions.  We will see her back in the office in 2 weeks.     Vanita Panda. Magnus Ivan, M.D.    CYB/MEDQ  D:  12/05/2010  T:  12/05/2010  Job:  [id number]  Electronically Signed by Doneen Poisson M.D. on 12/05/2010 07:13:22 PM

## 2011-03-20 DIAGNOSIS — M545 Low back pain, unspecified: Secondary | ICD-10-CM | POA: Diagnosis not present

## 2011-03-20 DIAGNOSIS — M4120 Other idiopathic scoliosis, site unspecified: Secondary | ICD-10-CM | POA: Diagnosis not present

## 2011-03-28 DIAGNOSIS — I10 Essential (primary) hypertension: Secondary | ICD-10-CM | POA: Diagnosis not present

## 2011-03-28 DIAGNOSIS — N6459 Other signs and symptoms in breast: Secondary | ICD-10-CM | POA: Diagnosis not present

## 2011-03-29 DIAGNOSIS — N6459 Other signs and symptoms in breast: Secondary | ICD-10-CM | POA: Diagnosis not present

## 2011-04-04 DIAGNOSIS — N6459 Other signs and symptoms in breast: Secondary | ICD-10-CM | POA: Diagnosis not present

## 2011-04-06 ENCOUNTER — Encounter (INDEPENDENT_AMBULATORY_CARE_PROVIDER_SITE_OTHER): Payer: Self-pay | Admitting: Surgery

## 2011-04-17 ENCOUNTER — Other Ambulatory Visit (INDEPENDENT_AMBULATORY_CARE_PROVIDER_SITE_OTHER): Payer: Self-pay | Admitting: Surgery

## 2011-04-17 ENCOUNTER — Ambulatory Visit (INDEPENDENT_AMBULATORY_CARE_PROVIDER_SITE_OTHER): Payer: Medicare Other | Admitting: Surgery

## 2011-04-17 ENCOUNTER — Encounter (INDEPENDENT_AMBULATORY_CARE_PROVIDER_SITE_OTHER): Payer: Self-pay | Admitting: Surgery

## 2011-04-17 VITALS — BP 154/86 | HR 64 | Temp 97.8°F | Resp 18 | Ht 68.0 in | Wt 183.6 lb

## 2011-04-17 DIAGNOSIS — N6459 Other signs and symptoms in breast: Secondary | ICD-10-CM

## 2011-04-17 DIAGNOSIS — N6452 Nipple discharge: Secondary | ICD-10-CM

## 2011-04-17 NOTE — Progress Notes (Signed)
Patient ID: Kathy Murphy, female   DOB: 08-May-1927, 78 y.o.   MRN: [id number]  Chief Complaint  Patient presents with  . Breast Problem    left breast dilated duct- new pt    HPI Kathy Murphy is a 78 y.o. female.   HPIThis is a very pleasant female referred by Dr. Jacky Kindle for evaluation of nipple discharge. She had drainage from her left breast around Christmas. This was brown fluid became spontaneously. She has had no previous history of breast discharge and she denies any bloody discharge. She reports it is not drained in 2-3 weeks.  She is otherwise without complaints. She has had benign masses removed from both breasts in the past. She has no history of breast cancer.  Past Medical History  Diagnosis Date  . Heart disease, hypertensive   . Hypercholesterolemia   . Arthritis   . Unsteady gait   . Myalgia   . Pain in joints   . Retinal detachment   . Hypothyroidism   . GERD (gastroesophageal reflux disease)   . UTI (urinary tract infection)   . Vomiting   . Obesity   . GI bleed 1980    AFTER POLYP EXCISION  . Hypertension   . Cancer     thyroid    Past Surgical History  Procedure Date  . Foot surgery   . Dental surgery 1952  . Thyroidectomy 1957  . Cervical laminectomy   . Breast biopsy   . Abdominal hysterectomy 1977  . Breast lumpectomy     x5  . Neck surgery   . Total hip arthroplasty 2012    left    Family History  Problem Relation Age of Onset  . Heart disease Mother     Social History History  Substance Use Topics  . Smoking status: Never Smoker   . Smokeless tobacco: Not on file  . Alcohol Use: No    No Known Allergies  Current Outpatient Prescriptions  Medication Sig Dispense Refill  . aspirin 81 MG tablet Take 81 mg by mouth daily.        . diazepam (VALIUM) 2 MG tablet Take 2 mg by mouth every 6 (six) hours as needed.        . Diclofenac Sodium CR 100 MG 24 hr tablet Take 100 mg by mouth daily.        Marland Kitchen estrogens, conjugated,  (PREMARIN) 0.9 MG tablet Take 0.9 mg by mouth daily. Take daily for 21 days then do not take for 7 days.       Marland Kitchen levothyroxine (SYNTHROID, LEVOTHROID) 137 MCG tablet Take 137 mcg by mouth daily.        Marland Kitchen losartan (COZAAR) 100 MG tablet daily.      . nebivolol (BYSTOLIC) 10 MG tablet Take 10 mg by mouth daily.        . pantoprazole (PROTONIX) 40 MG tablet Take 40 mg by mouth daily.          Review of Systems Review of Systems  Constitutional: Negative for fever, chills and unexpected weight change.  HENT: Negative for hearing loss, congestion, sore throat, trouble swallowing and voice change.   Eyes: Negative for visual disturbance.  Respiratory: Negative for cough and wheezing.   Cardiovascular: Negative for chest pain, palpitations and leg swelling.  Gastrointestinal: Negative for nausea, vomiting, abdominal pain, diarrhea, constipation, blood in stool, abdominal distention and anal bleeding.  Genitourinary: Negative for hematuria, vaginal bleeding and difficulty urinating.  Musculoskeletal: Negative for arthralgias.  Skin: Negative for rash and wound.  Neurological: Negative for seizures, syncope and headaches.  Hematological: Negative for adenopathy. Does not bruise/bleed easily.  Psychiatric/Behavioral: Negative for confusion.    Blood pressure 154/86, pulse 64, temperature 97.8 F (36.6 C), temperature source Temporal, resp. rate 18, height 5\' 8"  (1.727 m), weight 183 lb 9.6 oz (83.28 kg).  Physical Exam Physical Exam  Constitutional: She is oriented to person, place, and time. She appears well-developed and well-nourished. No distress.  HENT:  Head: Normocephalic and atraumatic.  Right Ear: External ear normal.  Left Ear: External ear normal.  Nose: Nose normal.  Mouth/Throat: Oropharynx is clear and moist. No oropharyngeal exudate.  Eyes: Conjunctivae are normal. Pupils are equal, round, and reactive to light. Right eye exhibits no discharge. Left eye exhibits no discharge.  No scleral icterus.  Neck: Normal range of motion. Neck supple. No tracheal deviation present. No thyromegaly present.  Cardiovascular: Normal rate, regular rhythm, normal heart sounds and intact distal pulses.   No murmur heard. Pulmonary/Chest: Effort normal and breath sounds normal. No respiratory distress. She has no wheezes.  Abdominal: Soft. Bowel sounds are normal. She exhibits no distension. There is no tenderness.  Musculoskeletal: Normal range of motion. She exhibits no edema and no tenderness.  Lymphadenopathy:    She has no cervical adenopathy.    She has no axillary adenopathy.       Breast exam bilaterally. There are no palpable masses in either breast. She has multiple well-healed scars. I did not create any discharge from the nipple left side today  Neurological: She is alert and oriented to person, place, and time.  Skin: Skin is warm and dry. No rash noted. No erythema.  Psychiatric: Her behavior is normal. Judgment normal.    Data Reviewed I have reviewed the patient's mammogram and ultrasound. There is a dilated central duct and the breast. It is only mildly suspicious in appearance. There are no frank masses and no microcalcifications in the breast  Assessment    Left nipple discharge and dilated duct of the left breast    Plan    At this point, I discussed surgical excision of this area versus expectant management. I suspect this is benign disease. She is not ear to proceed with surgery which I feel is reasonable. I am going to have her repeat the ultrasound and mammogram of left breast in 6 months and I will see her back then. She will return sooner if the drainage returns or becomes bloody. I again explained to her that there was a chance of malignancy although it is small. She agrees with this treatment course       [name] A 04/17/2011, 2:09 PM

## 2011-04-18 ENCOUNTER — Encounter (INDEPENDENT_AMBULATORY_CARE_PROVIDER_SITE_OTHER): Payer: Self-pay

## 2011-05-02 DIAGNOSIS — M76899 Other specified enthesopathies of unspecified lower limb, excluding foot: Secondary | ICD-10-CM | POA: Diagnosis not present

## 2011-05-02 DIAGNOSIS — M25559 Pain in unspecified hip: Secondary | ICD-10-CM | POA: Diagnosis not present

## 2011-05-09 DIAGNOSIS — M76899 Other specified enthesopathies of unspecified lower limb, excluding foot: Secondary | ICD-10-CM | POA: Diagnosis not present

## 2011-05-09 DIAGNOSIS — M25559 Pain in unspecified hip: Secondary | ICD-10-CM | POA: Diagnosis not present

## 2011-05-15 DIAGNOSIS — M76899 Other specified enthesopathies of unspecified lower limb, excluding foot: Secondary | ICD-10-CM | POA: Diagnosis not present

## 2011-05-15 DIAGNOSIS — M25559 Pain in unspecified hip: Secondary | ICD-10-CM | POA: Diagnosis not present

## 2011-05-17 DIAGNOSIS — M76899 Other specified enthesopathies of unspecified lower limb, excluding foot: Secondary | ICD-10-CM | POA: Diagnosis not present

## 2011-05-17 DIAGNOSIS — M25559 Pain in unspecified hip: Secondary | ICD-10-CM | POA: Diagnosis not present

## 2011-05-30 DIAGNOSIS — M76899 Other specified enthesopathies of unspecified lower limb, excluding foot: Secondary | ICD-10-CM | POA: Diagnosis not present

## 2011-05-30 DIAGNOSIS — M25559 Pain in unspecified hip: Secondary | ICD-10-CM | POA: Diagnosis not present

## 2011-05-31 DIAGNOSIS — M25559 Pain in unspecified hip: Secondary | ICD-10-CM | POA: Diagnosis not present

## 2011-05-31 DIAGNOSIS — M76899 Other specified enthesopathies of unspecified lower limb, excluding foot: Secondary | ICD-10-CM | POA: Diagnosis not present

## 2011-06-19 DIAGNOSIS — M25559 Pain in unspecified hip: Secondary | ICD-10-CM | POA: Diagnosis not present

## 2011-06-19 DIAGNOSIS — M169 Osteoarthritis of hip, unspecified: Secondary | ICD-10-CM | POA: Diagnosis not present

## 2011-06-19 DIAGNOSIS — M76899 Other specified enthesopathies of unspecified lower limb, excluding foot: Secondary | ICD-10-CM | POA: Diagnosis not present

## 2011-07-17 ENCOUNTER — Other Ambulatory Visit: Payer: Self-pay | Admitting: Internal Medicine

## 2011-07-17 ENCOUNTER — Ambulatory Visit
Admission: RE | Admit: 2011-07-17 | Discharge: 2011-07-17 | Disposition: A | Discharge status: Home / Self Care | Payer: Medicare Other | Source: Ambulatory Visit | Attending: Internal Medicine | Admitting: Internal Medicine

## 2011-07-17 DIAGNOSIS — R112 Nausea with vomiting, unspecified: Secondary | ICD-10-CM | POA: Diagnosis not present

## 2011-07-17 DIAGNOSIS — R1011 Right upper quadrant pain: Secondary | ICD-10-CM | POA: Diagnosis not present

## 2011-07-17 DIAGNOSIS — I10 Essential (primary) hypertension: Secondary | ICD-10-CM | POA: Diagnosis not present

## 2011-07-17 DIAGNOSIS — R109 Unspecified abdominal pain: Secondary | ICD-10-CM | POA: Diagnosis not present

## 2011-07-17 DIAGNOSIS — K219 Gastro-esophageal reflux disease without esophagitis: Secondary | ICD-10-CM | POA: Diagnosis not present

## 2011-07-26 ENCOUNTER — Encounter (INDEPENDENT_AMBULATORY_CARE_PROVIDER_SITE_OTHER): Payer: Self-pay | Admitting: General Surgery

## 2011-07-26 ENCOUNTER — Ambulatory Visit (INDEPENDENT_AMBULATORY_CARE_PROVIDER_SITE_OTHER): Payer: Medicare Other | Admitting: General Surgery

## 2011-07-26 VITALS — BP 140/87 | HR 70 | Temp 98.0°F | Resp 14 | Ht 67.0 in | Wt 187.4 lb

## 2011-07-26 DIAGNOSIS — K8020 Calculus of gallbladder without cholecystitis without obstruction: Secondary | ICD-10-CM

## 2011-07-26 NOTE — Progress Notes (Signed)
Patient ID: Kathy Murphy, female   DOB: Oct 07, 1927, 78 y.o.   MRN: [id number]  Chief Complaint  Patient presents with  . New Evaluation    est pt. Consult for GB    HPI Kathy Murphy is a 78 y.o. female.   HPI This patient was referred by [facility] for evaluation of symptomatic cholelithiasis. She was in her usual state of health until recently approximately 10 days ago she awoke from her sleep with sharp right upper quadrant pain which lasted most of the day and then spontaneously resolved. She presented to her primary care physician's office and she was evaluated for this and ultrasound of the abdomen was ordered which demonstrated cholelithiasis without evidence of acute cholecystitis. She had associated nausea and one episode of vomiting of clear fluid which did not provide any relief of her symptoms. She denies any fevers or associated symptoms. She denies any heartburn she does take PPIs daily. She states that her bowels are normal and denies any blood in the stools.  Past Medical History  Diagnosis Date  . Heart disease, hypertensive   . Hypercholesterolemia   . Arthritis   . Unsteady gait   . Myalgia   . Pain in joints   . Retinal detachment   . Hypothyroidism   . GERD (gastroesophageal reflux disease)   . UTI (urinary tract infection)   . Vomiting   . Obesity   . GI bleed 1980    AFTER POLYP EXCISION  . Hypertension   . Cancer     thyroid    Past Surgical History  Procedure Date  . Foot surgery   . Dental surgery 1952  . Thyroidectomy 1957  . Cervical laminectomy   . Breast biopsy   . Abdominal hysterectomy 1977  . Breast lumpectomy     x5  . Neck surgery   . Total hip arthroplasty 2012    left    Family History  Problem Relation Age of Onset  . Heart disease Mother     Social History History  Substance Use Topics  . Smoking status: Never Smoker   . Smokeless tobacco: Not on file  . Alcohol Use: No    No Known  Allergies  Current Outpatient Prescriptions  Medication Sig Dispense Refill  . aspirin 81 MG tablet Take 81 mg by mouth daily.        . Diclofenac Sodium CR 100 MG 24 hr tablet Take 100 mg by mouth daily.        Marland Kitchen estrogens, conjugated, (PREMARIN) 0.9 MG tablet Take 0.9 mg by mouth daily. Take daily for 21 days then do not take for 7 days.       Marland Kitchen HYDROcodone-acetaminophen (NORCO) 5-325 MG per tablet Take 1 tablet by mouth every 8 (eight) hours as needed.      Marland Kitchen levothyroxine (SYNTHROID, LEVOTHROID) 137 MCG tablet Take 137 mcg by mouth daily.        Marland Kitchen losartan (COZAAR) 100 MG tablet daily.      . methocarbamol (ROBAXIN) 500 MG tablet Take 500 mg by mouth 4 (four) times daily.      . nebivolol (BYSTOLIC) 10 MG tablet Take 10 mg by mouth daily.        Marland Kitchen omeprazole (PRILOSEC) 20 MG capsule Take 20 mg by mouth daily.      . pantoprazole (PROTONIX) 40 MG tablet Take 40 mg by mouth daily.        Marland Kitchen Phenyleph-Chlorphen-Hydrocod (HYDROCODONE-PE-CHLORPHENIRAMIN PO) Take 5  mLs by mouth 2 (two) times daily.        Review of Systems Review of Systems All other review of systems negative or noncontributory except as stated in the HPI  Blood pressure 140/87, pulse 70, temperature 98 F (36.7 C), temperature source Temporal, resp. rate 14, height 5\' 7"  (1.702 m), weight 187 lb 6.4 oz (85.004 kg).  Physical Exam Physical Exam Physical Exam  Nursing note and vitals reviewed. Constitutional: She is oriented to person, place, and time. She appears well-developed and well-nourished. No distress.  HENT:  Head: Normocephalic and atraumatic.  Mouth/Throat: No oropharyngeal exudate.  Eyes: Conjunctivae and EOM are normal. Pupils are equal, round, and reactive to light. Right eye exhibits no discharge. Left eye exhibits no discharge. No scleral icterus.  Neck: Normal range of motion. Neck supple. No tracheal deviation present.  Cardiovascular: Normal rate, regular rhythm, normal heart sounds and intact  distal pulses.   Pulmonary/Chest: Effort normal and breath sounds normal. No stridor. No respiratory distress. She has no wheezes.  Abdominal: Soft. Bowel sounds are normal. She exhibits no distension and no mass. There is no tenderness. There is no rebound and no guarding.  Musculoskeletal: Normal range of motion. She exhibits no edema and no tenderness.  Neurological: She is alert and oriented to person, place, and time.  Skin: Skin is warm and dry. No rash noted. She is not diaphoretic. No erythema. No pallor.  Psychiatric: She has a normal mood and affect. Her behavior is normal. Judgment and thought content normal.    Data Reviewed [place], labs  Assessment    Symptomatic cholelithiasis I agree her symptoms are most likely due to symptomatic cholelithiasis. She has gallstones on ultrasound and her history is pretty classic for symptomatic cholelithiasis as well. However, she has been pain-free and is not interested in any elective surgery unless this is absolutely required. We did offer her left upper cholecystectomy for relief of her symptoms but she is not interested in scheduling this at this time. I discussed with her at the options of continued observation with the  the risks of this including persistent symptoms, cholecystitis and pancreatitis and the need for urgent surgery. She expressed understanding.    Plan    She would like to not scheduled surgery this time and continue to follow this to see if she has persistent symptoms. I think that this is a reasonable option for her but if she doesn't change her mind and decided to proceed with surgery, she can only back and would be happy to schedule this at her convenience.       Lodema Pilot DAVID 07/26/2011, 10:33 AM

## 2011-08-01 DIAGNOSIS — I10 Essential (primary) hypertension: Secondary | ICD-10-CM | POA: Diagnosis not present

## 2011-08-01 DIAGNOSIS — R0602 Shortness of breath: Secondary | ICD-10-CM | POA: Diagnosis not present

## 2011-08-01 DIAGNOSIS — R05 Cough: Secondary | ICD-10-CM | POA: Diagnosis not present

## 2011-08-22 ENCOUNTER — Encounter (INDEPENDENT_AMBULATORY_CARE_PROVIDER_SITE_OTHER): Payer: Self-pay | Admitting: Surgery

## 2011-09-06 DIAGNOSIS — R82998 Other abnormal findings in urine: Secondary | ICD-10-CM | POA: Diagnosis not present

## 2011-09-06 DIAGNOSIS — I10 Essential (primary) hypertension: Secondary | ICD-10-CM | POA: Diagnosis not present

## 2011-09-06 DIAGNOSIS — E039 Hypothyroidism, unspecified: Secondary | ICD-10-CM | POA: Diagnosis not present

## 2011-09-12 DIAGNOSIS — I10 Essential (primary) hypertension: Secondary | ICD-10-CM | POA: Diagnosis not present

## 2011-09-12 DIAGNOSIS — M169 Osteoarthritis of hip, unspecified: Secondary | ICD-10-CM | POA: Diagnosis not present

## 2011-09-12 DIAGNOSIS — Z1212 Encounter for screening for malignant neoplasm of rectum: Secondary | ICD-10-CM | POA: Diagnosis not present

## 2011-09-12 DIAGNOSIS — E039 Hypothyroidism, unspecified: Secondary | ICD-10-CM | POA: Diagnosis not present

## 2011-09-12 DIAGNOSIS — Z0000 Encounter for general adult medical examination without abnormal findings: Secondary | ICD-10-CM | POA: Diagnosis not present

## 2011-09-27 DIAGNOSIS — Z853 Personal history of malignant neoplasm of breast: Secondary | ICD-10-CM | POA: Diagnosis not present

## 2011-10-05 ENCOUNTER — Encounter (INDEPENDENT_AMBULATORY_CARE_PROVIDER_SITE_OTHER): Payer: Self-pay

## 2011-10-19 ENCOUNTER — Ambulatory Visit (INDEPENDENT_AMBULATORY_CARE_PROVIDER_SITE_OTHER): Payer: Medicare Other | Admitting: Surgery

## 2011-10-19 ENCOUNTER — Encounter (INDEPENDENT_AMBULATORY_CARE_PROVIDER_SITE_OTHER): Payer: Self-pay | Admitting: Surgery

## 2011-10-19 VITALS — BP 134/84 | HR 68 | Temp 97.6°F | Ht 69.0 in | Wt 189.4 lb

## 2011-10-19 DIAGNOSIS — N6459 Other signs and symptoms in breast: Secondary | ICD-10-CM | POA: Diagnosis not present

## 2011-10-19 DIAGNOSIS — N6452 Nipple discharge: Secondary | ICD-10-CM

## 2011-10-19 NOTE — Progress Notes (Signed)
Subjective:     Patient ID: Kathy Murphy, female   DOB: March 18, 1927, 78 y.o.   MRN: [id number]  HPI She is here for a followup of her left breast nipple discharge. I saw her in February for this. She reports no other episodes of nipple discharge. Her last mammograms from last month or normal. She is otherwise without complaints  Review of Systems     Objective:   Physical Exam On exam there are no palpable breast masses and the nipple was normal. I cannot express any fluid from the nipple.    Assessment:     Resolution of nipple discharge    Plan:     There is nothing further to offer from a surgical standpoint. I will see her back it recurs

## 2011-11-20 DIAGNOSIS — M169 Osteoarthritis of hip, unspecified: Secondary | ICD-10-CM | POA: Diagnosis not present

## 2011-11-20 DIAGNOSIS — M25559 Pain in unspecified hip: Secondary | ICD-10-CM | POA: Diagnosis not present

## 2011-11-20 DIAGNOSIS — M76899 Other specified enthesopathies of unspecified lower limb, excluding foot: Secondary | ICD-10-CM | POA: Diagnosis not present

## 2011-11-29 DIAGNOSIS — Z961 Presence of intraocular lens: Secondary | ICD-10-CM | POA: Diagnosis not present

## 2011-11-29 DIAGNOSIS — H5440 Blindness, one eye, unspecified eye: Secondary | ICD-10-CM | POA: Diagnosis not present

## 2011-11-29 DIAGNOSIS — H31009 Unspecified chorioretinal scars, unspecified eye: Secondary | ICD-10-CM | POA: Diagnosis not present

## 2011-11-29 DIAGNOSIS — H43819 Vitreous degeneration, unspecified eye: Secondary | ICD-10-CM | POA: Diagnosis not present

## 2011-12-11 DIAGNOSIS — M76829 Posterior tibial tendinitis, unspecified leg: Secondary | ICD-10-CM | POA: Diagnosis not present

## 2011-12-19 DIAGNOSIS — Z23 Encounter for immunization: Secondary | ICD-10-CM | POA: Diagnosis not present

## 2012-03-01 DIAGNOSIS — J019 Acute sinusitis, unspecified: Secondary | ICD-10-CM | POA: Diagnosis not present

## 2012-03-21 DIAGNOSIS — M169 Osteoarthritis of hip, unspecified: Secondary | ICD-10-CM | POA: Diagnosis not present

## 2012-03-21 DIAGNOSIS — K219 Gastro-esophageal reflux disease without esophagitis: Secondary | ICD-10-CM | POA: Diagnosis not present

## 2012-03-21 DIAGNOSIS — E039 Hypothyroidism, unspecified: Secondary | ICD-10-CM | POA: Diagnosis not present

## 2012-03-21 DIAGNOSIS — I10 Essential (primary) hypertension: Secondary | ICD-10-CM | POA: Diagnosis not present

## 2012-04-09 DIAGNOSIS — M259 Joint disorder, unspecified: Secondary | ICD-10-CM | POA: Diagnosis not present

## 2012-04-09 DIAGNOSIS — Z96649 Presence of unspecified artificial hip joint: Secondary | ICD-10-CM | POA: Diagnosis not present

## 2012-04-11 DIAGNOSIS — K589 Irritable bowel syndrome without diarrhea: Secondary | ICD-10-CM | POA: Diagnosis not present

## 2012-04-11 DIAGNOSIS — M169 Osteoarthritis of hip, unspecified: Secondary | ICD-10-CM | POA: Diagnosis not present

## 2012-04-11 DIAGNOSIS — I10 Essential (primary) hypertension: Secondary | ICD-10-CM | POA: Diagnosis not present

## 2012-04-11 DIAGNOSIS — Q762 Congenital spondylolisthesis: Secondary | ICD-10-CM | POA: Diagnosis not present

## 2012-05-08 DIAGNOSIS — M76899 Other specified enthesopathies of unspecified lower limb, excluding foot: Secondary | ICD-10-CM | POA: Diagnosis not present

## 2012-06-05 DIAGNOSIS — M76899 Other specified enthesopathies of unspecified lower limb, excluding foot: Secondary | ICD-10-CM | POA: Diagnosis not present

## 2012-06-06 DIAGNOSIS — IMO0002 Reserved for concepts with insufficient information to code with codable children: Secondary | ICD-10-CM | POA: Diagnosis not present

## 2012-06-06 DIAGNOSIS — M999 Biomechanical lesion, unspecified: Secondary | ICD-10-CM | POA: Diagnosis not present

## 2012-06-06 DIAGNOSIS — M25559 Pain in unspecified hip: Secondary | ICD-10-CM | POA: Diagnosis not present

## 2012-06-06 DIAGNOSIS — M5137 Other intervertebral disc degeneration, lumbosacral region: Secondary | ICD-10-CM | POA: Diagnosis not present

## 2012-06-11 DIAGNOSIS — IMO0002 Reserved for concepts with insufficient information to code with codable children: Secondary | ICD-10-CM | POA: Diagnosis not present

## 2012-06-11 DIAGNOSIS — M999 Biomechanical lesion, unspecified: Secondary | ICD-10-CM | POA: Diagnosis not present

## 2012-06-11 DIAGNOSIS — M5137 Other intervertebral disc degeneration, lumbosacral region: Secondary | ICD-10-CM | POA: Diagnosis not present

## 2012-06-11 DIAGNOSIS — M25559 Pain in unspecified hip: Secondary | ICD-10-CM | POA: Diagnosis not present

## 2012-06-13 DIAGNOSIS — M25559 Pain in unspecified hip: Secondary | ICD-10-CM | POA: Diagnosis not present

## 2012-06-13 DIAGNOSIS — IMO0002 Reserved for concepts with insufficient information to code with codable children: Secondary | ICD-10-CM | POA: Diagnosis not present

## 2012-06-13 DIAGNOSIS — M999 Biomechanical lesion, unspecified: Secondary | ICD-10-CM | POA: Diagnosis not present

## 2012-06-13 DIAGNOSIS — M5137 Other intervertebral disc degeneration, lumbosacral region: Secondary | ICD-10-CM | POA: Diagnosis not present

## 2012-06-17 DIAGNOSIS — M25559 Pain in unspecified hip: Secondary | ICD-10-CM | POA: Diagnosis not present

## 2012-06-17 DIAGNOSIS — M999 Biomechanical lesion, unspecified: Secondary | ICD-10-CM | POA: Diagnosis not present

## 2012-06-17 DIAGNOSIS — M5137 Other intervertebral disc degeneration, lumbosacral region: Secondary | ICD-10-CM | POA: Diagnosis not present

## 2012-06-17 DIAGNOSIS — IMO0002 Reserved for concepts with insufficient information to code with codable children: Secondary | ICD-10-CM | POA: Diagnosis not present

## 2012-06-18 DIAGNOSIS — M999 Biomechanical lesion, unspecified: Secondary | ICD-10-CM | POA: Diagnosis not present

## 2012-06-18 DIAGNOSIS — M25559 Pain in unspecified hip: Secondary | ICD-10-CM | POA: Diagnosis not present

## 2012-06-18 DIAGNOSIS — M5137 Other intervertebral disc degeneration, lumbosacral region: Secondary | ICD-10-CM | POA: Diagnosis not present

## 2012-06-18 DIAGNOSIS — IMO0002 Reserved for concepts with insufficient information to code with codable children: Secondary | ICD-10-CM | POA: Diagnosis not present

## 2012-06-20 DIAGNOSIS — M5137 Other intervertebral disc degeneration, lumbosacral region: Secondary | ICD-10-CM | POA: Diagnosis not present

## 2012-06-20 DIAGNOSIS — M999 Biomechanical lesion, unspecified: Secondary | ICD-10-CM | POA: Diagnosis not present

## 2012-06-20 DIAGNOSIS — M25559 Pain in unspecified hip: Secondary | ICD-10-CM | POA: Diagnosis not present

## 2012-06-20 DIAGNOSIS — IMO0002 Reserved for concepts with insufficient information to code with codable children: Secondary | ICD-10-CM | POA: Diagnosis not present

## 2012-06-24 DIAGNOSIS — M25559 Pain in unspecified hip: Secondary | ICD-10-CM | POA: Diagnosis not present

## 2012-06-24 DIAGNOSIS — M999 Biomechanical lesion, unspecified: Secondary | ICD-10-CM | POA: Diagnosis not present

## 2012-06-24 DIAGNOSIS — M5137 Other intervertebral disc degeneration, lumbosacral region: Secondary | ICD-10-CM | POA: Diagnosis not present

## 2012-06-24 DIAGNOSIS — IMO0002 Reserved for concepts with insufficient information to code with codable children: Secondary | ICD-10-CM | POA: Diagnosis not present

## 2012-06-25 DIAGNOSIS — IMO0002 Reserved for concepts with insufficient information to code with codable children: Secondary | ICD-10-CM | POA: Diagnosis not present

## 2012-06-25 DIAGNOSIS — M999 Biomechanical lesion, unspecified: Secondary | ICD-10-CM | POA: Diagnosis not present

## 2012-06-25 DIAGNOSIS — M5137 Other intervertebral disc degeneration, lumbosacral region: Secondary | ICD-10-CM | POA: Diagnosis not present

## 2012-06-25 DIAGNOSIS — M25559 Pain in unspecified hip: Secondary | ICD-10-CM | POA: Diagnosis not present

## 2012-07-01 DIAGNOSIS — M25559 Pain in unspecified hip: Secondary | ICD-10-CM | POA: Diagnosis not present

## 2012-07-01 DIAGNOSIS — M999 Biomechanical lesion, unspecified: Secondary | ICD-10-CM | POA: Diagnosis not present

## 2012-07-01 DIAGNOSIS — M5137 Other intervertebral disc degeneration, lumbosacral region: Secondary | ICD-10-CM | POA: Diagnosis not present

## 2012-07-01 DIAGNOSIS — IMO0002 Reserved for concepts with insufficient information to code with codable children: Secondary | ICD-10-CM | POA: Diagnosis not present

## 2012-07-02 DIAGNOSIS — IMO0002 Reserved for concepts with insufficient information to code with codable children: Secondary | ICD-10-CM | POA: Diagnosis not present

## 2012-07-02 DIAGNOSIS — M999 Biomechanical lesion, unspecified: Secondary | ICD-10-CM | POA: Diagnosis not present

## 2012-07-02 DIAGNOSIS — M25559 Pain in unspecified hip: Secondary | ICD-10-CM | POA: Diagnosis not present

## 2012-07-02 DIAGNOSIS — M5137 Other intervertebral disc degeneration, lumbosacral region: Secondary | ICD-10-CM | POA: Diagnosis not present

## 2012-07-04 DIAGNOSIS — M5137 Other intervertebral disc degeneration, lumbosacral region: Secondary | ICD-10-CM | POA: Diagnosis not present

## 2012-07-04 DIAGNOSIS — M999 Biomechanical lesion, unspecified: Secondary | ICD-10-CM | POA: Diagnosis not present

## 2012-07-04 DIAGNOSIS — M25559 Pain in unspecified hip: Secondary | ICD-10-CM | POA: Diagnosis not present

## 2012-07-04 DIAGNOSIS — IMO0002 Reserved for concepts with insufficient information to code with codable children: Secondary | ICD-10-CM | POA: Diagnosis not present

## 2012-07-08 DIAGNOSIS — M5137 Other intervertebral disc degeneration, lumbosacral region: Secondary | ICD-10-CM | POA: Diagnosis not present

## 2012-07-08 DIAGNOSIS — M999 Biomechanical lesion, unspecified: Secondary | ICD-10-CM | POA: Diagnosis not present

## 2012-07-08 DIAGNOSIS — M25559 Pain in unspecified hip: Secondary | ICD-10-CM | POA: Diagnosis not present

## 2012-07-08 DIAGNOSIS — IMO0002 Reserved for concepts with insufficient information to code with codable children: Secondary | ICD-10-CM | POA: Diagnosis not present

## 2012-07-09 DIAGNOSIS — M999 Biomechanical lesion, unspecified: Secondary | ICD-10-CM | POA: Diagnosis not present

## 2012-07-09 DIAGNOSIS — M25559 Pain in unspecified hip: Secondary | ICD-10-CM | POA: Diagnosis not present

## 2012-07-09 DIAGNOSIS — IMO0002 Reserved for concepts with insufficient information to code with codable children: Secondary | ICD-10-CM | POA: Diagnosis not present

## 2012-07-09 DIAGNOSIS — M5137 Other intervertebral disc degeneration, lumbosacral region: Secondary | ICD-10-CM | POA: Diagnosis not present

## 2012-07-11 DIAGNOSIS — M999 Biomechanical lesion, unspecified: Secondary | ICD-10-CM | POA: Diagnosis not present

## 2012-07-11 DIAGNOSIS — M25559 Pain in unspecified hip: Secondary | ICD-10-CM | POA: Diagnosis not present

## 2012-07-11 DIAGNOSIS — M5137 Other intervertebral disc degeneration, lumbosacral region: Secondary | ICD-10-CM | POA: Diagnosis not present

## 2012-07-11 DIAGNOSIS — IMO0002 Reserved for concepts with insufficient information to code with codable children: Secondary | ICD-10-CM | POA: Diagnosis not present

## 2012-07-16 DIAGNOSIS — M999 Biomechanical lesion, unspecified: Secondary | ICD-10-CM | POA: Diagnosis not present

## 2012-07-16 DIAGNOSIS — IMO0002 Reserved for concepts with insufficient information to code with codable children: Secondary | ICD-10-CM | POA: Diagnosis not present

## 2012-07-16 DIAGNOSIS — M5137 Other intervertebral disc degeneration, lumbosacral region: Secondary | ICD-10-CM | POA: Diagnosis not present

## 2012-07-16 DIAGNOSIS — M25559 Pain in unspecified hip: Secondary | ICD-10-CM | POA: Diagnosis not present

## 2012-07-17 DIAGNOSIS — M5137 Other intervertebral disc degeneration, lumbosacral region: Secondary | ICD-10-CM | POA: Diagnosis not present

## 2012-07-17 DIAGNOSIS — M999 Biomechanical lesion, unspecified: Secondary | ICD-10-CM | POA: Diagnosis not present

## 2012-07-17 DIAGNOSIS — IMO0002 Reserved for concepts with insufficient information to code with codable children: Secondary | ICD-10-CM | POA: Diagnosis not present

## 2012-07-17 DIAGNOSIS — M25559 Pain in unspecified hip: Secondary | ICD-10-CM | POA: Diagnosis not present

## 2012-07-22 DIAGNOSIS — M25559 Pain in unspecified hip: Secondary | ICD-10-CM | POA: Diagnosis not present

## 2012-07-22 DIAGNOSIS — IMO0002 Reserved for concepts with insufficient information to code with codable children: Secondary | ICD-10-CM | POA: Diagnosis not present

## 2012-07-22 DIAGNOSIS — M5137 Other intervertebral disc degeneration, lumbosacral region: Secondary | ICD-10-CM | POA: Diagnosis not present

## 2012-07-22 DIAGNOSIS — M999 Biomechanical lesion, unspecified: Secondary | ICD-10-CM | POA: Diagnosis not present

## 2012-07-30 DIAGNOSIS — M25559 Pain in unspecified hip: Secondary | ICD-10-CM | POA: Diagnosis not present

## 2012-07-30 DIAGNOSIS — M5137 Other intervertebral disc degeneration, lumbosacral region: Secondary | ICD-10-CM | POA: Diagnosis not present

## 2012-07-30 DIAGNOSIS — M999 Biomechanical lesion, unspecified: Secondary | ICD-10-CM | POA: Diagnosis not present

## 2012-07-30 DIAGNOSIS — IMO0002 Reserved for concepts with insufficient information to code with codable children: Secondary | ICD-10-CM | POA: Diagnosis not present

## 2012-07-31 DIAGNOSIS — IMO0002 Reserved for concepts with insufficient information to code with codable children: Secondary | ICD-10-CM | POA: Diagnosis not present

## 2012-07-31 DIAGNOSIS — M25559 Pain in unspecified hip: Secondary | ICD-10-CM | POA: Diagnosis not present

## 2012-07-31 DIAGNOSIS — M5137 Other intervertebral disc degeneration, lumbosacral region: Secondary | ICD-10-CM | POA: Diagnosis not present

## 2012-07-31 DIAGNOSIS — M999 Biomechanical lesion, unspecified: Secondary | ICD-10-CM | POA: Diagnosis not present

## 2012-08-07 DIAGNOSIS — IMO0002 Reserved for concepts with insufficient information to code with codable children: Secondary | ICD-10-CM | POA: Diagnosis not present

## 2012-08-07 DIAGNOSIS — M5137 Other intervertebral disc degeneration, lumbosacral region: Secondary | ICD-10-CM | POA: Diagnosis not present

## 2012-08-07 DIAGNOSIS — M25559 Pain in unspecified hip: Secondary | ICD-10-CM | POA: Diagnosis not present

## 2012-08-07 DIAGNOSIS — M999 Biomechanical lesion, unspecified: Secondary | ICD-10-CM | POA: Diagnosis not present

## 2012-09-04 DIAGNOSIS — M76899 Other specified enthesopathies of unspecified lower limb, excluding foot: Secondary | ICD-10-CM | POA: Diagnosis not present

## 2012-09-11 DIAGNOSIS — E039 Hypothyroidism, unspecified: Secondary | ICD-10-CM | POA: Diagnosis not present

## 2012-09-11 DIAGNOSIS — I10 Essential (primary) hypertension: Secondary | ICD-10-CM | POA: Diagnosis not present

## 2012-09-18 DIAGNOSIS — K219 Gastro-esophageal reflux disease without esophagitis: Secondary | ICD-10-CM | POA: Diagnosis not present

## 2012-09-18 DIAGNOSIS — Z1331 Encounter for screening for depression: Secondary | ICD-10-CM | POA: Diagnosis not present

## 2012-09-18 DIAGNOSIS — Z6828 Body mass index (BMI) 28.0-28.9, adult: Secondary | ICD-10-CM | POA: Diagnosis not present

## 2012-09-18 DIAGNOSIS — I10 Essential (primary) hypertension: Secondary | ICD-10-CM | POA: Diagnosis not present

## 2012-09-18 DIAGNOSIS — E785 Hyperlipidemia, unspecified: Secondary | ICD-10-CM | POA: Insufficient documentation

## 2012-09-18 DIAGNOSIS — M169 Osteoarthritis of hip, unspecified: Secondary | ICD-10-CM | POA: Diagnosis not present

## 2012-09-18 DIAGNOSIS — E039 Hypothyroidism, unspecified: Secondary | ICD-10-CM | POA: Diagnosis not present

## 2012-09-18 DIAGNOSIS — Z0000 Encounter for general adult medical examination without abnormal findings: Secondary | ICD-10-CM | POA: Diagnosis not present

## 2012-09-19 DIAGNOSIS — Z1212 Encounter for screening for malignant neoplasm of rectum: Secondary | ICD-10-CM | POA: Diagnosis not present

## 2012-10-01 DIAGNOSIS — N6459 Other signs and symptoms in breast: Secondary | ICD-10-CM | POA: Diagnosis not present

## 2012-10-25 DIAGNOSIS — M25469 Effusion, unspecified knee: Secondary | ICD-10-CM | POA: Insufficient documentation

## 2012-10-25 DIAGNOSIS — M25569 Pain in unspecified knee: Secondary | ICD-10-CM | POA: Diagnosis not present

## 2012-10-25 DIAGNOSIS — M1710 Unilateral primary osteoarthritis, unspecified knee: Secondary | ICD-10-CM | POA: Diagnosis not present

## 2012-10-25 DIAGNOSIS — M169 Osteoarthritis of hip, unspecified: Secondary | ICD-10-CM | POA: Diagnosis not present

## 2012-10-25 DIAGNOSIS — Z6829 Body mass index (BMI) 29.0-29.9, adult: Secondary | ICD-10-CM | POA: Diagnosis not present

## 2012-11-19 DIAGNOSIS — M169 Osteoarthritis of hip, unspecified: Secondary | ICD-10-CM | POA: Diagnosis not present

## 2012-11-19 DIAGNOSIS — M76829 Posterior tibial tendinitis, unspecified leg: Secondary | ICD-10-CM | POA: Diagnosis not present

## 2012-11-19 DIAGNOSIS — M76899 Other specified enthesopathies of unspecified lower limb, excluding foot: Secondary | ICD-10-CM | POA: Diagnosis not present

## 2012-11-29 DIAGNOSIS — Z961 Presence of intraocular lens: Secondary | ICD-10-CM | POA: Diagnosis not present

## 2012-11-29 DIAGNOSIS — H524 Presbyopia: Secondary | ICD-10-CM | POA: Diagnosis not present

## 2012-11-29 DIAGNOSIS — H31009 Unspecified chorioretinal scars, unspecified eye: Secondary | ICD-10-CM | POA: Diagnosis not present

## 2012-11-29 DIAGNOSIS — H4720 Unspecified optic atrophy: Secondary | ICD-10-CM | POA: Diagnosis not present

## 2012-12-05 DIAGNOSIS — Z23 Encounter for immunization: Secondary | ICD-10-CM | POA: Diagnosis not present

## 2012-12-19 DIAGNOSIS — H35389 Toxic maculopathy, unspecified eye: Secondary | ICD-10-CM | POA: Diagnosis not present

## 2012-12-26 ENCOUNTER — Emergency Department (HOSPITAL_COMMUNITY): Payer: Medicare Other

## 2012-12-26 ENCOUNTER — Encounter (HOSPITAL_COMMUNITY): Payer: Self-pay | Admitting: Emergency Medicine

## 2012-12-26 ENCOUNTER — Emergency Department (HOSPITAL_COMMUNITY)
Admission: EM | Admit: 2012-12-26 | Discharge: 2012-12-26 | Disposition: A | Discharge status: Home / Self Care | Payer: Medicare Other | Attending: Emergency Medicine | Admitting: Emergency Medicine

## 2012-12-26 DIAGNOSIS — Z85850 Personal history of malignant neoplasm of thyroid: Secondary | ICD-10-CM | POA: Insufficient documentation

## 2012-12-26 DIAGNOSIS — Y9389 Activity, other specified: Secondary | ICD-10-CM | POA: Insufficient documentation

## 2012-12-26 DIAGNOSIS — Z7982 Long term (current) use of aspirin: Secondary | ICD-10-CM | POA: Diagnosis not present

## 2012-12-26 DIAGNOSIS — S0100XA Unspecified open wound of scalp, initial encounter: Secondary | ICD-10-CM | POA: Diagnosis not present

## 2012-12-26 DIAGNOSIS — S0990XA Unspecified injury of head, initial encounter: Secondary | ICD-10-CM

## 2012-12-26 DIAGNOSIS — Y92009 Unspecified place in unspecified non-institutional (private) residence as the place of occurrence of the external cause: Secondary | ICD-10-CM | POA: Insufficient documentation

## 2012-12-26 DIAGNOSIS — E669 Obesity, unspecified: Secondary | ICD-10-CM | POA: Insufficient documentation

## 2012-12-26 DIAGNOSIS — W19XXXA Unspecified fall, initial encounter: Secondary | ICD-10-CM

## 2012-12-26 DIAGNOSIS — M25519 Pain in unspecified shoulder: Secondary | ICD-10-CM | POA: Diagnosis not present

## 2012-12-26 DIAGNOSIS — Z23 Encounter for immunization: Secondary | ICD-10-CM | POA: Diagnosis not present

## 2012-12-26 DIAGNOSIS — R404 Transient alteration of awareness: Secondary | ICD-10-CM | POA: Diagnosis not present

## 2012-12-26 DIAGNOSIS — M25562 Pain in left knee: Secondary | ICD-10-CM

## 2012-12-26 DIAGNOSIS — M129 Arthropathy, unspecified: Secondary | ICD-10-CM | POA: Insufficient documentation

## 2012-12-26 DIAGNOSIS — Z87440 Personal history of urinary (tract) infections: Secondary | ICD-10-CM | POA: Diagnosis not present

## 2012-12-26 DIAGNOSIS — K219 Gastro-esophageal reflux disease without esophagitis: Secondary | ICD-10-CM | POA: Insufficient documentation

## 2012-12-26 DIAGNOSIS — S0181XA Laceration without foreign body of other part of head, initial encounter: Secondary | ICD-10-CM

## 2012-12-26 DIAGNOSIS — E039 Hypothyroidism, unspecified: Secondary | ICD-10-CM | POA: Diagnosis not present

## 2012-12-26 DIAGNOSIS — S8990XA Unspecified injury of unspecified lower leg, initial encounter: Secondary | ICD-10-CM | POA: Diagnosis not present

## 2012-12-26 DIAGNOSIS — S0003XA Contusion of scalp, initial encounter: Secondary | ICD-10-CM | POA: Diagnosis not present

## 2012-12-26 DIAGNOSIS — S0180XA Unspecified open wound of other part of head, initial encounter: Secondary | ICD-10-CM | POA: Diagnosis not present

## 2012-12-26 DIAGNOSIS — Z791 Long term (current) use of non-steroidal anti-inflammatories (NSAID): Secondary | ICD-10-CM | POA: Diagnosis not present

## 2012-12-26 DIAGNOSIS — Z8669 Personal history of other diseases of the nervous system and sense organs: Secondary | ICD-10-CM | POA: Diagnosis not present

## 2012-12-26 DIAGNOSIS — W1809XA Striking against other object with subsequent fall, initial encounter: Secondary | ICD-10-CM | POA: Insufficient documentation

## 2012-12-26 DIAGNOSIS — S46909A Unspecified injury of unspecified muscle, fascia and tendon at shoulder and upper arm level, unspecified arm, initial encounter: Secondary | ICD-10-CM | POA: Insufficient documentation

## 2012-12-26 DIAGNOSIS — M25512 Pain in left shoulder: Secondary | ICD-10-CM

## 2012-12-26 DIAGNOSIS — S4980XA Other specified injuries of shoulder and upper arm, unspecified arm, initial encounter: Secondary | ICD-10-CM | POA: Insufficient documentation

## 2012-12-26 DIAGNOSIS — M25569 Pain in unspecified knee: Secondary | ICD-10-CM | POA: Diagnosis not present

## 2012-12-26 DIAGNOSIS — I10 Essential (primary) hypertension: Secondary | ICD-10-CM | POA: Diagnosis not present

## 2012-12-26 DIAGNOSIS — R42 Dizziness and giddiness: Secondary | ICD-10-CM | POA: Diagnosis not present

## 2012-12-26 LAB — CBC WITH DIFFERENTIAL/PLATELET
Basophils Relative: 0 % (ref 0–1)
Eosinophils Absolute: 0.2 10*3/uL (ref 0.0–0.7)
Eosinophils Relative: 2 % (ref 0–5)
HCT: 39.1 % (ref 36.0–46.0)
Hemoglobin: 13 g/dL (ref 12.0–15.0)
MCH: 30.5 pg (ref 26.0–34.0)
MCHC: 33.2 g/dL (ref 30.0–36.0)
Monocytes Absolute: 0.8 10*3/uL (ref 0.1–1.0)
Monocytes Relative: 10 % (ref 3–12)
RDW: 13 % (ref 11.5–15.5)

## 2012-12-26 LAB — URINALYSIS, ROUTINE W REFLEX MICROSCOPIC
Glucose, UA: NEGATIVE mg/dL
Leukocytes, UA: NEGATIVE
Protein, ur: NEGATIVE mg/dL
Specific Gravity, Urine: 1.019 (ref 1.005–1.030)
pH: 6 (ref 5.0–8.0)

## 2012-12-26 LAB — COMPREHENSIVE METABOLIC PANEL
Albumin: 3.4 g/dL — ABNORMAL LOW (ref 3.5–5.2)
BUN: 17 mg/dL (ref 6–23)
Calcium: 8.8 mg/dL (ref 8.4–10.5)
Creatinine, Ser: 0.72 mg/dL (ref 0.50–1.10)
Total Bilirubin: 0.4 mg/dL (ref 0.3–1.2)
Total Protein: 6.7 g/dL (ref 6.0–8.3)

## 2012-12-26 LAB — URINE MICROSCOPIC-ADD ON

## 2012-12-26 LAB — POCT I-STAT TROPONIN I: Troponin i, poc: 0 ng/mL (ref 0.00–0.08)

## 2012-12-26 MED ORDER — LIDOCAINE-EPINEPHRINE (PF) 2 %-1:200000 IJ SOLN
INTRAMUSCULAR | Status: AC
  Administered 2012-12-26: 20 mL
  Filled 2012-12-26: qty 10

## 2012-12-26 MED ORDER — TETANUS-DIPHTH-ACELL PERTUSSIS 5-2.5-18.5 LF-MCG/0.5 IM SUSP
0.5000 mL | Freq: Once | INTRAMUSCULAR | Status: AC
  Administered 2012-12-26: 0.5 mL via INTRAMUSCULAR
  Filled 2012-12-26: qty 0.5

## 2012-12-26 NOTE — ED Notes (Signed)
Bleeding stopped at this moment, will recheck pt in a few minutes

## 2012-12-26 NOTE — ED Notes (Signed)
Bed: WA21 Expected date: 12/26/12 Expected time: 6:07 AM Means of arrival: Ambulance Comments: Bed 21, EMS, 85 F, Dizziness

## 2012-12-26 NOTE — ED Notes (Signed)
Pt ambulatory.

## 2012-12-26 NOTE — ED Notes (Signed)
Bleeding controlled, slight drip of blood noted from check a few minutes.

## 2012-12-26 NOTE — ED Notes (Signed)
Patient transported to CT 

## 2012-12-26 NOTE — ED Provider Notes (Signed)
CSN: [id number]     Arrival date & time 12/26/12  0617 History   First MD Initiated Contact with Patient 12/26/12 [id number]     Chief Complaint  Patient presents with  . Fall  . Head Injury   (Consider location/radiation/quality/duration/timing/severity/associated sxs/prior Treatment) HPI Comments: Patient presents today via EMS after a head injury.  She reports that this morning she stood up from her bed to go to the bathroom and fell to the ground when she stood up.  She states that her head hit a the corner of a dresser when she fell.  She denies LOC.  She sustained a small laceration to the left upper forehead when she fell.  She is unsure what caused the fall.  She denies dizziness or lightheadedness prior to the fall.  She denies any chest pain or SOB.  Denies back pain or neck pain.  No nausea, vomiting, or vision changes.  No focal weakness. She is currently on 81 mg ASA, but not on any other anticoagulants.  She is currently complaining of a headache, left shoulder pain, and left knee pain.  She denies any other pain.    The history is provided by the patient.    Past Medical History  Diagnosis Date  . Heart disease, hypertensive   . Hypercholesterolemia   . Arthritis   . Unsteady gait   . Myalgia   . Pain in joints   . Retinal detachment   . Hypothyroidism   . GERD (gastroesophageal reflux disease)   . UTI (urinary tract infection)   . Vomiting   . Obesity   . GI bleed 1980    AFTER POLYP EXCISION  . Hypertension   . Cancer     thyroid   Past Surgical History  Procedure Laterality Date  . Foot surgery    . Dental surgery  1952  . Thyroidectomy  1957  . Cervical laminectomy    . Breast biopsy    . Abdominal hysterectomy  1977  . Breast lumpectomy      x5  . Neck surgery    . Total hip arthroplasty  2012    left   Family History  Problem Relation Age of Onset  . Heart disease Mother    History  Substance Use Topics  . Smoking status: Never Smoker   .  Smokeless tobacco: Not on file  . Alcohol Use: No   OB History   Grav Para Term Preterm Abortions TAB SAB Ect Mult Living                 Review of Systems  Musculoskeletal:       Left knee pain Left shoulder pain  Neurological: Positive for headaches.  All other systems reviewed and are negative.    Allergies  Review of patient's allergies indicates no known allergies.  Home Medications   Current Outpatient Rx  Name  Route  Sig  Dispense  Refill  . aspirin 81 MG tablet   Oral   Take 81 mg by mouth daily.           . chlorpheniramine-HYDROcodone (TUSSIONEX) 10-8 MG/5ML LQCR               . Diclofenac Sodium CR 100 MG 24 hr tablet   Oral   Take 100 mg by mouth daily.           Marland Kitchen estrogens, conjugated, (PREMARIN) 0.9 MG tablet   Oral   Take 0.9 mg by mouth daily.  Take daily for 21 days then do not take for 7 days.          Marland Kitchen HYDROcodone-acetaminophen (NORCO) 5-325 MG per tablet   Oral   Take 1 tablet by mouth every 8 (eight) hours as needed.         Marland Kitchen levothyroxine (SYNTHROID, LEVOTHROID) 137 MCG tablet   Oral   Take 137 mcg by mouth daily.           Marland Kitchen losartan (COZAAR) 100 MG tablet      daily.         . methocarbamol (ROBAXIN) 500 MG tablet   Oral   Take 500 mg by mouth 4 (four) times daily as needed.          . nebivolol (BYSTOLIC) 10 MG tablet   Oral   Take 10 mg by mouth daily.           Marland Kitchen omeprazole (PRILOSEC) 20 MG capsule   Oral   Take 20 mg by mouth daily.         . pantoprazole (PROTONIX) 40 MG tablet   Oral   Take 40 mg by mouth daily.           Marland Kitchen Phenyleph-Chlorphen-Hydrocod (HYDROCODONE-PE-CHLORPHENIRAMIN PO)   Oral   Take 5 mLs by mouth 2 (two) times daily.          BP 175/78  Pulse 67  Temp(Src) 98.4 F (36.9 C) (Oral)  Resp 18  SpO2 96% Physical Exam  Nursing note and vitals reviewed. Constitutional: She appears well-developed and well-nourished.  HENT:  Head:    Mouth/Throat: Oropharynx is  clear and moist.  Eyes: EOM are normal. Pupils are equal, round, and reactive to light.  Neck: Normal range of motion. Neck supple.  Cardiovascular: Normal rate, regular rhythm and normal heart sounds.   Pulmonary/Chest: Effort normal and breath sounds normal.  Abdominal: Soft. Bowel sounds are normal. She exhibits no distension and no mass. There is no tenderness. There is no rebound and no guarding.  Musculoskeletal:       Left shoulder: She exhibits decreased range of motion, tenderness and bony tenderness. She exhibits no swelling and no effusion.       Left knee: She exhibits swelling and ecchymosis. Tenderness found.       Cervical back: She exhibits normal range of motion, no tenderness, no bony tenderness, no swelling, no edema and no deformity.       Thoracic back: She exhibits normal range of motion, no tenderness, no bony tenderness, no swelling, no edema and no deformity.       Lumbar back: She exhibits normal range of motion, no tenderness, no bony tenderness, no swelling, no edema and no deformity.  Neurological: She is alert. She has normal strength. No cranial nerve deficit or sensory deficit.  Skin: Skin is warm and dry.     Psychiatric: She has a normal mood and affect.    ED Course  Procedures (including critical care time) Labs Review Labs Reviewed - No data to display Imaging Review Ct Head Wo Contrast  12/26/2012   CLINICAL DATA:  Dizziness, head injury after fall.  EXAM: CT HEAD WITHOUT CONTRAST  TECHNIQUE: Contiguous axial images were obtained from the base of the skull through the vertex without intravenous contrast.  COMPARISON:  None.  FINDINGS: Bony calvarium is intact. Mild chronic ischemic white matter disease is noted. No mass effect or midline shift is noted. Ventricular size is within normal limits. Left frontal  scalp hematoma is noted. There is no evidence of mass lesion, acute intracranial hemorrhage or acute infarction.  IMPRESSION: Left frontal scalp  hematoma. Mild chronic ischemic white matter disease. No acute intracranial abnormality seen.   Electronically Signed   By: Roque Lias M.D.   On: 12/26/2012 07:46   Dg Shoulder Left  12/26/2012   CLINICAL DATA:  Left shoulder swelling and bruising after fall.  EXAM: LEFT SHOULDER - 2+ VIEW  COMPARISON:  None.  FINDINGS: There is no evidence of fracture or dislocation. There is no evidence of arthropathy or other focal bone abnormality. Soft tissues are unremarkable.  IMPRESSION: Negative.   Electronically Signed   By: Roque Lias M.D.   On: 12/26/2012 07:40   Dg Knee Complete 4 Views Left  12/26/2012   CLINICAL DATA:  Left knee pain after fall.  EXAM: LEFT KNEE - COMPLETE 4+ VIEW  COMPARISON:  None.  FINDINGS: There is no evidence of fracture, dislocation, or joint effusion. There is no evidence of arthropathy or other focal bone abnormality. Soft tissues are unremarkable.  IMPRESSION: Normal left knee.   Electronically Signed   By: Roque Lias M.D.   On: 12/26/2012 07:48    EKG Interpretation   None       Date: 12/26/2012  Rate: 61  Rhythm: normal sinus rhythm  QRS Axis: left  Intervals: PR prolonged  ST/T Wave abnormalities: nonspecific T wave changes  Conduction Disutrbances:none and first-degree A-V block   Narrative Interpretation:   Old EKG Reviewed: changes noted, non specific t wave changes from last EKG in 2012    LACERATION REPAIR Performed by: Anne Shutter, Saidi Santacroce Authorized by: Anne Shutter, Herbert Seta Consent: Verbal consent obtained. Risks and benefits: risks, benefits and alternatives were discussed Consent given by: patient Patient identity confirmed: provided demographic data Prepped and Draped in normal sterile fashion Wound explored  Laceration Location: left upper forehead  Laceration Length:  1 cm  No Foreign Bodies seen or palpated  Anesthesia: local infiltration  Local anesthetic: lidocaine 2% with epinephrine  Anesthetic total: 6 ml  Irrigation  method: syringe Amount of cleaning: standard  Skin closure: 4-0 Chromic  Number of sutures: 2  Technique: Simple interrupted  Patient tolerance: Patient tolerated the procedure well with no immediate complications.  Patient discussed with and also evaluated by Dr. Manus Gunning  9:04 AM Patient ambulating without difficulty.  Patient asymptomatic at this time.  Patient tolerating PO liquids.  MDM  No diagnosis found. Patient presenting after a fall.  Unsure of the cause of the fall.  She denies LOC.  No acute findings on Head CT.  Negative xrays of the knee and shoulder.  Labs and UA unremarkable.  EKG unremarkable.  Troponin negative.  Patient able to ambulate in the ED without difficulty and tolerating PO liquids.  Feel that the patient is stable for discharge.  Return precautions given.    Pascal Lux [place], PA-C 12/26/12 1128

## 2012-12-26 NOTE — ED Provider Notes (Signed)
Medical screening examination/treatment/procedure(s) were conducted as a shared visit with non-physician practitioner(s) and myself.  I personally evaluated the patient during the encounter  Mechanical fall with head injury and laceration to left forehead with arterial bleeding. No dizziness or lightheadedness. No chest pain or shortness of breath. Neurologically intact. No anticoagulant use CN 2-12 intact, no ataxia on finger to nose, no nystagmus, 5/5 strength throughout, no pronator drift, Romberg negative, normal gait.   Glynn Octave, MD 12/26/12 [phone]

## 2012-12-26 NOTE — ED Notes (Signed)
Per EMS, pt was at home and going to bathroom.  Pt became dizzy and fell hitting head on corner of dresser.  Per EMS, No LOC.  Laceration left temple.  Abrasion to left arm and bruising to left knee.  Fall was not witnessed.  Pt states not feeling bad prior to the fall.  HX:  HTN.  Vitals:  172/95, hr 72, resp 18.  12 Lead Normal.  IV 20 g LFA.

## 2012-12-30 DIAGNOSIS — S0003XA Contusion of scalp, initial encounter: Secondary | ICD-10-CM | POA: Diagnosis not present

## 2012-12-30 DIAGNOSIS — Z6828 Body mass index (BMI) 28.0-28.9, adult: Secondary | ICD-10-CM | POA: Diagnosis not present

## 2012-12-30 DIAGNOSIS — I10 Essential (primary) hypertension: Secondary | ICD-10-CM | POA: Diagnosis not present

## 2012-12-30 DIAGNOSIS — M169 Osteoarthritis of hip, unspecified: Secondary | ICD-10-CM | POA: Diagnosis not present

## 2013-01-20 DIAGNOSIS — M25519 Pain in unspecified shoulder: Secondary | ICD-10-CM | POA: Diagnosis not present

## 2013-01-20 DIAGNOSIS — Z6828 Body mass index (BMI) 28.0-28.9, adult: Secondary | ICD-10-CM | POA: Diagnosis not present

## 2013-01-20 DIAGNOSIS — M169 Osteoarthritis of hip, unspecified: Secondary | ICD-10-CM | POA: Diagnosis not present

## 2013-02-17 DIAGNOSIS — M169 Osteoarthritis of hip, unspecified: Secondary | ICD-10-CM | POA: Diagnosis not present

## 2013-03-21 DIAGNOSIS — E039 Hypothyroidism, unspecified: Secondary | ICD-10-CM | POA: Diagnosis not present

## 2013-03-21 DIAGNOSIS — Q762 Congenital spondylolisthesis: Secondary | ICD-10-CM | POA: Diagnosis not present

## 2013-03-21 DIAGNOSIS — M169 Osteoarthritis of hip, unspecified: Secondary | ICD-10-CM | POA: Diagnosis not present

## 2013-03-21 DIAGNOSIS — Z6828 Body mass index (BMI) 28.0-28.9, adult: Secondary | ICD-10-CM | POA: Diagnosis not present

## 2013-03-21 DIAGNOSIS — K219 Gastro-esophageal reflux disease without esophagitis: Secondary | ICD-10-CM | POA: Diagnosis not present

## 2013-03-21 DIAGNOSIS — Z1331 Encounter for screening for depression: Secondary | ICD-10-CM | POA: Diagnosis not present

## 2013-03-21 DIAGNOSIS — E782 Mixed hyperlipidemia: Secondary | ICD-10-CM | POA: Diagnosis not present

## 2013-03-21 DIAGNOSIS — I10 Essential (primary) hypertension: Secondary | ICD-10-CM | POA: Diagnosis not present

## 2013-03-21 DIAGNOSIS — M1610 Unilateral primary osteoarthritis, unspecified hip: Secondary | ICD-10-CM | POA: Diagnosis not present

## 2013-07-04 DIAGNOSIS — H579 Unspecified disorder of eye and adnexa: Secondary | ICD-10-CM | POA: Diagnosis not present

## 2013-07-04 DIAGNOSIS — H5789 Other specified disorders of eye and adnexa: Secondary | ICD-10-CM | POA: Diagnosis not present

## 2013-07-04 DIAGNOSIS — S0500XA Injury of conjunctiva and corneal abrasion without foreign body, unspecified eye, initial encounter: Secondary | ICD-10-CM | POA: Diagnosis not present

## 2013-07-07 DIAGNOSIS — M2140 Flat foot [pes planus] (acquired), unspecified foot: Secondary | ICD-10-CM | POA: Diagnosis not present

## 2013-07-07 DIAGNOSIS — M2040 Other hammer toe(s) (acquired), unspecified foot: Secondary | ICD-10-CM | POA: Diagnosis not present

## 2013-07-07 DIAGNOSIS — M6240 Contracture of muscle, unspecified site: Secondary | ICD-10-CM | POA: Diagnosis not present

## 2013-09-15 DIAGNOSIS — R82998 Other abnormal findings in urine: Secondary | ICD-10-CM | POA: Diagnosis not present

## 2013-09-15 DIAGNOSIS — E782 Mixed hyperlipidemia: Secondary | ICD-10-CM | POA: Diagnosis not present

## 2013-09-15 DIAGNOSIS — I10 Essential (primary) hypertension: Secondary | ICD-10-CM | POA: Diagnosis not present

## 2013-09-15 DIAGNOSIS — E039 Hypothyroidism, unspecified: Secondary | ICD-10-CM | POA: Diagnosis not present

## 2013-09-19 DIAGNOSIS — H31009 Unspecified chorioretinal scars, unspecified eye: Secondary | ICD-10-CM | POA: Diagnosis not present

## 2013-09-19 DIAGNOSIS — H4720 Unspecified optic atrophy: Secondary | ICD-10-CM | POA: Diagnosis not present

## 2013-09-19 DIAGNOSIS — H35389 Toxic maculopathy, unspecified eye: Secondary | ICD-10-CM | POA: Diagnosis not present

## 2013-09-19 DIAGNOSIS — Z961 Presence of intraocular lens: Secondary | ICD-10-CM | POA: Diagnosis not present

## 2013-09-22 DIAGNOSIS — Z23 Encounter for immunization: Secondary | ICD-10-CM | POA: Diagnosis not present

## 2013-09-22 DIAGNOSIS — Z0000 Encounter for general adult medical examination without abnormal findings: Secondary | ICD-10-CM | POA: Diagnosis not present

## 2013-09-22 DIAGNOSIS — Z6827 Body mass index (BMI) 27.0-27.9, adult: Secondary | ICD-10-CM | POA: Diagnosis not present

## 2013-09-22 DIAGNOSIS — M169 Osteoarthritis of hip, unspecified: Secondary | ICD-10-CM | POA: Diagnosis not present

## 2013-09-22 DIAGNOSIS — Q762 Congenital spondylolisthesis: Secondary | ICD-10-CM | POA: Diagnosis not present

## 2013-09-22 DIAGNOSIS — I10 Essential (primary) hypertension: Secondary | ICD-10-CM | POA: Diagnosis not present

## 2013-09-22 DIAGNOSIS — E782 Mixed hyperlipidemia: Secondary | ICD-10-CM | POA: Diagnosis not present

## 2013-09-22 DIAGNOSIS — E039 Hypothyroidism, unspecified: Secondary | ICD-10-CM | POA: Diagnosis not present

## 2013-09-22 DIAGNOSIS — M1610 Unilateral primary osteoarthritis, unspecified hip: Secondary | ICD-10-CM | POA: Diagnosis not present

## 2013-09-23 DIAGNOSIS — M1710 Unilateral primary osteoarthritis, unspecified knee: Secondary | ICD-10-CM | POA: Diagnosis not present

## 2013-09-26 DIAGNOSIS — Z1212 Encounter for screening for malignant neoplasm of rectum: Secondary | ICD-10-CM | POA: Diagnosis not present

## 2013-09-29 DIAGNOSIS — M2040 Other hammer toe(s) (acquired), unspecified foot: Secondary | ICD-10-CM | POA: Diagnosis not present

## 2013-12-01 DIAGNOSIS — Z23 Encounter for immunization: Secondary | ICD-10-CM | POA: Diagnosis not present

## 2014-02-23 DIAGNOSIS — Z1231 Encounter for screening mammogram for malignant neoplasm of breast: Secondary | ICD-10-CM | POA: Diagnosis not present

## 2014-03-23 DIAGNOSIS — I10 Essential (primary) hypertension: Secondary | ICD-10-CM | POA: Diagnosis not present

## 2014-03-23 DIAGNOSIS — M169 Osteoarthritis of hip, unspecified: Secondary | ICD-10-CM | POA: Diagnosis not present

## 2014-03-23 DIAGNOSIS — Q762 Congenital spondylolisthesis: Secondary | ICD-10-CM | POA: Diagnosis not present

## 2014-03-23 DIAGNOSIS — K219 Gastro-esophageal reflux disease without esophagitis: Secondary | ICD-10-CM | POA: Diagnosis not present

## 2014-03-23 DIAGNOSIS — E039 Hypothyroidism, unspecified: Secondary | ICD-10-CM | POA: Diagnosis not present

## 2014-03-23 DIAGNOSIS — Z6827 Body mass index (BMI) 27.0-27.9, adult: Secondary | ICD-10-CM | POA: Diagnosis not present

## 2014-04-01 ENCOUNTER — Ambulatory Visit (INDEPENDENT_AMBULATORY_CARE_PROVIDER_SITE_OTHER): Payer: Medicare Other | Admitting: Podiatry

## 2014-04-01 ENCOUNTER — Encounter: Payer: Self-pay | Admitting: Podiatry

## 2014-04-01 VITALS — BP 156/79 | HR 60 | Resp 12

## 2014-04-01 DIAGNOSIS — M2041 Other hammer toe(s) (acquired), right foot: Secondary | ICD-10-CM | POA: Diagnosis not present

## 2014-04-01 DIAGNOSIS — L84 Corns and callosities: Secondary | ICD-10-CM | POA: Diagnosis not present

## 2014-04-01 NOTE — Patient Instructions (Signed)
Hammer Toes Hammer toes is a condition in which one or more of your toes is permanently flexed. CAUSES  This happens when a muscle imbalance or abnormal bone length makes your small toes buckle. This causes the toe joint to contract and the strong cord-like bands that attach muscles to the bones (tendons) in your toes to shorten.  SIGNS AND SYMPTOMS  Common symptoms of flexible hammer toes include:   A buildup of skin cells (corns). Corns occur where boney bumps come in frequent contact with hard surfaces. For example, where your shoes press and rub.  Irritation.  Inflammation.  Pain.  Limited motion in your toes. DIAGNOSIS  Hammer toes are diagnosed through a physical exam of your toes. During the exam, your health care provider may try to reproduce your symptoms by manipulating your foot. Often, X-ray exams are done to determine the degree of deformity and to make sure that the cause is not a fracture.  TREATMENT  Hammer toes can be treated with corrective surgery. There are several types of surgical procedures that can treat hammer toes. The most common procedures include:  Arthroplasty--A portion of the joint is surgically removed and your toe is straightened. The gap fills in with fibrous tissue. This procedure helps treat pain and deformity and helps restore function.  Fusion--Cartilage between the two bones of the affected joint is taken out and the bones fuse together into one longer bone. This helps keep your toe stable and reduces pain but leaves your toe stiff, yet straight.  Implantation--A portion of your bone is removed and replaced with an implant to restore motion.  Flexor tendon transfers--This procedure repositions the tendons that curl the toes down (flexor tendons). This may be done to release the deforming force that causes your toe to buckle. Several of these procedures require fixing your toe with a pin that is visible at the tip of your toe. The pin keeps the toe  straight during healing. Your health care provider will remove the pin usually within 4-8 weeks after the procedure.  Document Released: 02/25/2000 Document Revised: 03/04/2013 Document Reviewed: 11/04/2012 [facility] Patient Information 2015 [place], [place]. This information is not intended to replace advice given to you by your health care provider. Make sure you discuss any questions you have with your health care provider.

## 2014-04-01 NOTE — Progress Notes (Signed)
   Subjective:    Patient ID: Kathy Murphy, female    DOB: November 08, 1927, 81 y.o.   MRN: [id number]  HPI N-SORE, THICK SKIN L-RT FOOT 2ND AND 4TH TOE D-1 YEAR O-SLOWLY C-SLOWLY A-SHOES, PRESSURE T-TRIM  Patient describes right foot surgery several years ago by orthopedic doctor who is recommending further surgical treatment for the apparent flatfoot deformity in the right foot. Patient declined surgery  She has a recent widow  Review of Systems  HENT: Positive for sinus pressure.   Cardiovascular: Positive for leg swelling.  All other systems reviewed and are negative.      Objective:   Physical Exam  Pleasant orientated 3  Vascular: DP pulses 2/4 bilaterally PT pulses 2/4 bilaterally Capillary reflex immediate bilaterally  Neurological: Sensation to 10 g monofilament wire intact 5/5 bilaterally Vibratory sensation intact bilaterally Ankle reflex equal and reactive bilaterally  Dermatological: Well-healed surgical scar medial midfoot right Bleeding callus second right toe  Musculoskeletal: Unilateral pes valgo planus right Pes planus left Unsteady gait pattern Patient unable to heel off on right, able to heel off on left Semirigid hammertoe second right      Assessment & Plan:   Assessment: Hammertoe deformity second right Unilateral flatfoot with hyperpronation right Keratoses distal second right toe  Plan: Discuss treatment options for hammertoe including nonsurgical and surgical. Patient opting for nonsurgical treatment. Debrided keratoses distal second right  Dispense silicone buttress pad to offload toes 2-4 right. Patient found this device comfortable upon weight-bearing. Advised patient to continue wearing the buttress pad on a continuous daily basis and return as needed

## 2014-05-18 DIAGNOSIS — H31002 Unspecified chorioretinal scars, left eye: Secondary | ICD-10-CM | POA: Diagnosis not present

## 2014-05-18 DIAGNOSIS — H43811 Vitreous degeneration, right eye: Secondary | ICD-10-CM | POA: Diagnosis not present

## 2014-05-18 DIAGNOSIS — Z961 Presence of intraocular lens: Secondary | ICD-10-CM | POA: Diagnosis not present

## 2014-05-18 DIAGNOSIS — H52201 Unspecified astigmatism, right eye: Secondary | ICD-10-CM | POA: Diagnosis not present

## 2014-06-12 DIAGNOSIS — R05 Cough: Secondary | ICD-10-CM | POA: Diagnosis not present

## 2014-06-12 DIAGNOSIS — J069 Acute upper respiratory infection, unspecified: Secondary | ICD-10-CM | POA: Diagnosis not present

## 2014-06-12 DIAGNOSIS — R52 Pain, unspecified: Secondary | ICD-10-CM | POA: Diagnosis not present

## 2014-06-12 DIAGNOSIS — I10 Essential (primary) hypertension: Secondary | ICD-10-CM | POA: Diagnosis not present

## 2014-08-03 DIAGNOSIS — H1032 Unspecified acute conjunctivitis, left eye: Secondary | ICD-10-CM | POA: Diagnosis not present

## 2014-09-24 DIAGNOSIS — I10 Essential (primary) hypertension: Secondary | ICD-10-CM | POA: Diagnosis not present

## 2014-09-24 DIAGNOSIS — E039 Hypothyroidism, unspecified: Secondary | ICD-10-CM | POA: Diagnosis not present

## 2014-09-24 DIAGNOSIS — R8290 Unspecified abnormal findings in urine: Secondary | ICD-10-CM | POA: Diagnosis not present

## 2014-09-24 DIAGNOSIS — Z0000 Encounter for general adult medical examination without abnormal findings: Secondary | ICD-10-CM | POA: Insufficient documentation

## 2014-10-01 DIAGNOSIS — Z0000 Encounter for general adult medical examination without abnormal findings: Secondary | ICD-10-CM | POA: Diagnosis not present

## 2014-10-01 DIAGNOSIS — E785 Hyperlipidemia, unspecified: Secondary | ICD-10-CM | POA: Diagnosis not present

## 2014-10-01 DIAGNOSIS — M169 Osteoarthritis of hip, unspecified: Secondary | ICD-10-CM | POA: Diagnosis not present

## 2014-10-01 DIAGNOSIS — I10 Essential (primary) hypertension: Secondary | ICD-10-CM | POA: Diagnosis not present

## 2014-10-01 DIAGNOSIS — K219 Gastro-esophageal reflux disease without esophagitis: Secondary | ICD-10-CM | POA: Diagnosis not present

## 2014-10-01 DIAGNOSIS — E039 Hypothyroidism, unspecified: Secondary | ICD-10-CM | POA: Diagnosis not present

## 2014-10-01 DIAGNOSIS — Z1389 Encounter for screening for other disorder: Secondary | ICD-10-CM | POA: Diagnosis not present

## 2014-10-01 DIAGNOSIS — Z6827 Body mass index (BMI) 27.0-27.9, adult: Secondary | ICD-10-CM | POA: Diagnosis not present

## 2014-10-05 DIAGNOSIS — Z1212 Encounter for screening for malignant neoplasm of rectum: Secondary | ICD-10-CM | POA: Diagnosis not present

## 2014-11-28 DIAGNOSIS — Z23 Encounter for immunization: Secondary | ICD-10-CM | POA: Diagnosis not present

## 2014-12-15 ENCOUNTER — Ambulatory Visit (INDEPENDENT_AMBULATORY_CARE_PROVIDER_SITE_OTHER): Payer: Medicare Other | Admitting: Podiatry

## 2014-12-15 ENCOUNTER — Encounter: Payer: Self-pay | Admitting: Podiatry

## 2014-12-15 VITALS — BP 153/82 | HR 62 | Resp 12

## 2014-12-15 DIAGNOSIS — L84 Corns and callosities: Secondary | ICD-10-CM

## 2014-12-15 NOTE — Patient Instructions (Signed)
Continue to wear toe prop on the right foot Return as needed for trimming of painful corn on the second right toe

## 2014-12-15 NOTE — Progress Notes (Signed)
Patient ID: Kathy Murphy, female   DOB: Jun 13, 1927, 81 y.o.   MRN: [id number]  Subjective: This patient presents today complaining of a painful corn on the distal aspect the second right toe. This lesion was last debrided on the visit of 04/01/2014  Objective: Orientated 3 Palpable pedal pulses bilaterally Rigid hammertoe second left with distal keratoses Or pronation right  Assessment: Painful corn distal second right toe associated with hammertoe deformity and hyperpronation, unilateral flatfoot  Plan: Debridement of corn distal second right toe without a bleeding Continue wearing toe prop on right foot Return as needed

## 2015-02-08 DIAGNOSIS — J209 Acute bronchitis, unspecified: Secondary | ICD-10-CM | POA: Diagnosis not present

## 2015-02-08 DIAGNOSIS — Z6827 Body mass index (BMI) 27.0-27.9, adult: Secondary | ICD-10-CM | POA: Diagnosis not present

## 2015-04-05 DIAGNOSIS — E784 Other hyperlipidemia: Secondary | ICD-10-CM | POA: Diagnosis not present

## 2015-04-05 DIAGNOSIS — Z1389 Encounter for screening for other disorder: Secondary | ICD-10-CM | POA: Diagnosis not present

## 2015-04-05 DIAGNOSIS — Q762 Congenital spondylolisthesis: Secondary | ICD-10-CM | POA: Diagnosis not present

## 2015-04-05 DIAGNOSIS — K219 Gastro-esophageal reflux disease without esophagitis: Secondary | ICD-10-CM | POA: Diagnosis not present

## 2015-04-05 DIAGNOSIS — E038 Other specified hypothyroidism: Secondary | ICD-10-CM | POA: Diagnosis not present

## 2015-04-05 DIAGNOSIS — K589 Irritable bowel syndrome without diarrhea: Secondary | ICD-10-CM | POA: Diagnosis not present

## 2015-04-05 DIAGNOSIS — Z6827 Body mass index (BMI) 27.0-27.9, adult: Secondary | ICD-10-CM | POA: Diagnosis not present

## 2015-04-05 DIAGNOSIS — M79673 Pain in unspecified foot: Secondary | ICD-10-CM | POA: Diagnosis not present

## 2015-04-05 DIAGNOSIS — M25461 Effusion, right knee: Secondary | ICD-10-CM | POA: Diagnosis not present

## 2015-04-05 DIAGNOSIS — M169 Osteoarthritis of hip, unspecified: Secondary | ICD-10-CM | POA: Diagnosis not present

## 2015-04-05 DIAGNOSIS — I10 Essential (primary) hypertension: Secondary | ICD-10-CM | POA: Diagnosis not present

## 2015-04-07 DIAGNOSIS — Z1231 Encounter for screening mammogram for malignant neoplasm of breast: Secondary | ICD-10-CM | POA: Diagnosis not present

## 2015-04-13 DIAGNOSIS — R922 Inconclusive mammogram: Secondary | ICD-10-CM | POA: Diagnosis not present

## 2015-04-13 DIAGNOSIS — R928 Other abnormal and inconclusive findings on diagnostic imaging of breast: Secondary | ICD-10-CM | POA: Diagnosis not present

## 2015-04-13 DIAGNOSIS — Z1231 Encounter for screening mammogram for malignant neoplasm of breast: Secondary | ICD-10-CM | POA: Diagnosis not present

## 2015-05-24 DIAGNOSIS — M2041 Other hammer toe(s) (acquired), right foot: Secondary | ICD-10-CM | POA: Diagnosis not present

## 2015-05-24 DIAGNOSIS — M79671 Pain in right foot: Secondary | ICD-10-CM | POA: Diagnosis not present

## 2015-06-02 DIAGNOSIS — Z961 Presence of intraocular lens: Secondary | ICD-10-CM | POA: Diagnosis not present

## 2015-06-02 DIAGNOSIS — H31002 Unspecified chorioretinal scars, left eye: Secondary | ICD-10-CM | POA: Diagnosis not present

## 2015-06-02 DIAGNOSIS — Z0100 Encounter for examination of eyes and vision without abnormal findings: Secondary | ICD-10-CM | POA: Diagnosis not present

## 2015-06-02 DIAGNOSIS — H43811 Vitreous degeneration, right eye: Secondary | ICD-10-CM | POA: Diagnosis not present

## 2015-06-30 DIAGNOSIS — Z6827 Body mass index (BMI) 27.0-27.9, adult: Secondary | ICD-10-CM | POA: Diagnosis not present

## 2015-06-30 DIAGNOSIS — I10 Essential (primary) hypertension: Secondary | ICD-10-CM | POA: Diagnosis not present

## 2015-06-30 DIAGNOSIS — R42 Dizziness and giddiness: Secondary | ICD-10-CM | POA: Diagnosis not present

## 2015-07-21 DIAGNOSIS — M6701 Short Achilles tendon (acquired), right ankle: Secondary | ICD-10-CM | POA: Diagnosis not present

## 2015-07-21 DIAGNOSIS — M2041 Other hammer toe(s) (acquired), right foot: Secondary | ICD-10-CM | POA: Diagnosis not present

## 2015-07-21 DIAGNOSIS — M1711 Unilateral primary osteoarthritis, right knee: Secondary | ICD-10-CM | POA: Diagnosis not present

## 2015-07-21 DIAGNOSIS — M79671 Pain in right foot: Secondary | ICD-10-CM | POA: Diagnosis not present

## 2015-07-21 DIAGNOSIS — M2141 Flat foot [pes planus] (acquired), right foot: Secondary | ICD-10-CM | POA: Diagnosis not present

## 2015-09-22 DIAGNOSIS — M76821 Posterior tibial tendinitis, right leg: Secondary | ICD-10-CM | POA: Diagnosis not present

## 2015-09-22 DIAGNOSIS — M2041 Other hammer toe(s) (acquired), right foot: Secondary | ICD-10-CM | POA: Diagnosis not present

## 2015-10-07 DIAGNOSIS — H10412 Chronic giant papillary conjunctivitis, left eye: Secondary | ICD-10-CM | POA: Diagnosis not present

## 2015-10-22 DIAGNOSIS — H10413 Chronic giant papillary conjunctivitis, bilateral: Secondary | ICD-10-CM | POA: Diagnosis not present

## 2015-10-27 DIAGNOSIS — E784 Other hyperlipidemia: Secondary | ICD-10-CM | POA: Diagnosis not present

## 2015-10-27 DIAGNOSIS — I10 Essential (primary) hypertension: Secondary | ICD-10-CM | POA: Diagnosis not present

## 2015-10-27 DIAGNOSIS — E038 Other specified hypothyroidism: Secondary | ICD-10-CM | POA: Diagnosis not present

## 2015-10-27 DIAGNOSIS — R8290 Unspecified abnormal findings in urine: Secondary | ICD-10-CM | POA: Diagnosis not present

## 2015-11-03 DIAGNOSIS — K589 Irritable bowel syndrome without diarrhea: Secondary | ICD-10-CM | POA: Diagnosis not present

## 2015-11-03 DIAGNOSIS — Z0000 Encounter for general adult medical examination without abnormal findings: Secondary | ICD-10-CM | POA: Diagnosis not present

## 2015-11-03 DIAGNOSIS — M25461 Effusion, right knee: Secondary | ICD-10-CM | POA: Diagnosis not present

## 2015-11-03 DIAGNOSIS — M169 Osteoarthritis of hip, unspecified: Secondary | ICD-10-CM | POA: Diagnosis not present

## 2015-11-03 DIAGNOSIS — R079 Chest pain, unspecified: Secondary | ICD-10-CM | POA: Diagnosis not present

## 2015-11-03 DIAGNOSIS — I10 Essential (primary) hypertension: Secondary | ICD-10-CM | POA: Diagnosis not present

## 2015-11-03 DIAGNOSIS — Q762 Congenital spondylolisthesis: Secondary | ICD-10-CM | POA: Diagnosis not present

## 2015-11-03 DIAGNOSIS — E038 Other specified hypothyroidism: Secondary | ICD-10-CM | POA: Diagnosis not present

## 2015-11-03 DIAGNOSIS — K219 Gastro-esophageal reflux disease without esophagitis: Secondary | ICD-10-CM | POA: Diagnosis not present

## 2015-11-03 DIAGNOSIS — R2689 Other abnormalities of gait and mobility: Secondary | ICD-10-CM | POA: Diagnosis not present

## 2015-11-03 DIAGNOSIS — R269 Unspecified abnormalities of gait and mobility: Secondary | ICD-10-CM | POA: Insufficient documentation

## 2015-11-03 DIAGNOSIS — R42 Dizziness and giddiness: Secondary | ICD-10-CM | POA: Diagnosis not present

## 2015-11-03 DIAGNOSIS — E784 Other hyperlipidemia: Secondary | ICD-10-CM | POA: Diagnosis not present

## 2015-11-04 DIAGNOSIS — Z1212 Encounter for screening for malignant neoplasm of rectum: Secondary | ICD-10-CM | POA: Diagnosis not present

## 2015-11-24 ENCOUNTER — Telehealth: Payer: Self-pay | Admitting: Cardiovascular Disease

## 2015-11-24 NOTE — Telephone Encounter (Signed)
Received records from [facility] for appointment on 12/16/15 with Dr Sallyanne Kuster.  Records given to [facility] (medical records) for Dr Croitoru's schedule on 12/16/15. lp

## 2015-11-27 DIAGNOSIS — Z23 Encounter for immunization: Secondary | ICD-10-CM | POA: Diagnosis not present

## 2015-11-30 ENCOUNTER — Ambulatory Visit: Payer: Medicare Other | Attending: Internal Medicine

## 2015-11-30 VITALS — HR 82

## 2015-11-30 DIAGNOSIS — M6281 Muscle weakness (generalized): Secondary | ICD-10-CM | POA: Diagnosis not present

## 2015-11-30 DIAGNOSIS — R2689 Other abnormalities of gait and mobility: Secondary | ICD-10-CM | POA: Diagnosis not present

## 2015-11-30 NOTE — Therapy (Signed)
[facility] [address] [place], [place], [address] Phone: [phone]   Fax:  [phone]  Physical Therapy Evaluation  Patient Details  Name: Kathy Murphy MRN: [id number] Date of Birth: 10/27/1927 Referring Provider: Dr. Reynaldo Minium  Encounter Date: 11/30/2015      PT End of Session - 11/30/15 1305    Visit Number 1   Number of Visits 9   Date for PT Re-Evaluation 12/30/15   Authorization Type G-CODE AND PROGRESS NOTE EVERY 10TH VISIT.    PT Start Time [id number]   PT Stop Time 0928   PT Time Calculation (min) 41 min   Equipment Utilized During Treatment --  min guard, min A and S prn   Activity Tolerance Patient tolerated treatment well   Behavior During Therapy WFL for tasks assessed/performed      Past Medical History:  Diagnosis Date  . Arthritis   . Cancer ([place])    thyroid  . GERD (gastroesophageal reflux disease)   . GI bleed 1980   AFTER POLYP EXCISION  . Heart disease, hypertensive   . Hypercholesterolemia   . Hypertension   . Hypothyroidism   . Myalgia   . Obesity   . Pain in joints   . Retinal detachment   . Unsteady gait   . UTI (urinary tract infection)   . Vomiting     Past Surgical History:  Procedure Laterality Date  . ABDOMINAL HYSTERECTOMY  1977  . BREAST BIOPSY    . BREAST LUMPECTOMY     x5  . CERVICAL LAMINECTOMY    . [place]  . FOOT SURGERY    . NECK SURGERY    . THYROIDECTOMY  1957  . TOTAL HIP ARTHROPLASTY  2012   left    Vitals:   11/30/15 1303  Pulse: 82  SpO2: 94%         Subjective Assessment - 11/30/15 0859    Subjective Pt reported she has noticed more difficulty walking longer distances over the past year, and requested Dr. Reynaldo Minium write a prescription for a rollator. Pt is in the process of obtaining rollator from [facility], as the one pt has does not fold up easily. Pt reports she experiences SOB when amb. longer distances. Pt reports hx of LBP  also limits pt's ability to amb. longer distances. Pt has a R shoe orthotic s/p surgery for R foot fx, due to excessive pronation. Pt has intermittent dizziness, which she describes as lightheadedness vs. spinning. Pt was tested for dizziness in the 80's (per pt report) but they couldn't determine cause of dizziness. Pt states SOB and lightheadedness can occur after amb. and during STS txfs.    Pertinent History HTN, heart disease (cardio consult 12/2015), hx of L THA in~2012, hx of thyroid CA (in 1950's), hypothyroidism, receives injections for R knee pain, L retinal detachment, hx of R foot fx in 2010   Patient Stated Goals "To stand up straighter" and walk with better balance.    Currently in Pain? No/denies            [facility] PT Assessment - 11/30/15 0910      Assessment   Medical Diagnosis Gait and postural instability   Referring Provider Dr. Reynaldo Minium   Onset Date/Surgical Date 11/30/14   Prior Therapy PT for back pain     Precautions   Precautions Fall     Restrictions   Weight Bearing Restrictions No     Balance Screen  Has the patient fallen in the past 6 months Yes   How many times? 3   Has the patient had a decrease in activity level because of a fear of falling?  No   Is the patient reluctant to leave their home because of a fear of falling?  No     Home Social worker Private residence   Living Arrangements Alone   Available Help at Discharge Family;Friend(s)   Type of [place] to enter   Entrance Stairs-Number of Steps 3   Entrance Stairs-Rails Right   Home Layout One level   [place] - single point;Electric scooter;Wheelchair - manual;Walker - Psychologist, educational - 4 wheels  but rollator doesn't fold up well     Prior Function   Level of Independence Independent   Vocation Retired   Leisure getting together with friends     Cognition   Overall Cognitive Status Within Functional Limits for tasks assessed      Observation/Other Assessments   Focus on Therapeutic Outcomes (FOTO)  ABC: 44.4%-scores closer to 100% indicate increased confidence in balance.      Observation/Other Assessments-Edema    Edema --  LLE edema noted during MMT.     Sensation   Additional Comments Pt reports N/T in great R toe (hx of foot fx and hammer toe).     Posture/Postural Control   Posture/Postural Control Postural limitations   Postural Limitations Forward head;Weight shift right;Posterior pelvic tilt     ROM / Strength   AROM / PROM / Strength AROM;Strength     AROM   Overall AROM  Within functional limits for tasks performed   Overall AROM Comments B UE/LE AROM WFL     Strength   Overall Strength Deficits   Overall Strength Comments BLE: hip flex: 3+/5, knee ext: 4+/5, knee flex: 4/5, ankle DF: 4/5. Hip abd/add not formally assessed but hip abd. weakness suspected 2/2 gait impairments.      Transfers   Transfers Sit to Stand;Stand to Sit   Sit to Stand 5: Supervision;With upper extremity assist;From chair/3-in-1   Stand to Sit 5: Supervision;With upper extremity assist;To chair/3-in-1     Ambulation/Gait   Ambulation/Gait Yes   Ambulation/Gait Assistance 5: Supervision   Ambulation/Gait Assistance Details Pt wanted PT to observe gait without SPC. Pt reported SOB after amb. but SaO2 on room air was 94-96%.    Ambulation Distance (Feet) 100 Feet   Assistive device None   Gait Pattern Step-through pattern;Decreased stride length;Trendelenburg;Lateral trunk lean to right;Antalgic   Ambulation Surface Level;Indoor   Gait velocity 2.22ft/sec.     Balance   Balance Assessed Yes     Static Standing Balance   Static Standing - Balance Support No upper extremity supported   Static Standing - Level of Assistance 5: Stand by assistance;Other (comment);4: Min assist  min guard   Static Standing - Comment/# of Minutes Pt performed feet together with EO and SBA, but required min guard to min A to sustain  balance with feet together EC.     Standardized Balance Assessment   Standardized Balance Assessment Timed Up and Go Test     Timed Up and Go Test   TUG Normal TUG   Normal TUG (seconds) 9.7  incr. sway and gait deviations noted with incr. speed  PT Education - 11/30/15 1305    Education provided Yes   Education Details PT discussed frequency/duration and outcome measures.    Person(s) Educated Patient   Methods Explanation   Comprehension Verbalized understanding          PT Short Term Goals - 11/30/15 1311      PT SHORT TERM GOAL #1   Title same as LTGs.            PT Long Term Goals - 11/30/15 1311      PT LONG TERM GOAL #1   Title Pt will be IND in HEP to improve endurance, strength, and balance. TARGET DATE FOR ALL LTGS: 12/28/15   Status New     PT LONG TERM GOAL #2   Title Perform BERG and write goal prn.    Status New     PT LONG TERM GOAL #3   Title Pt will ambulate 500' over even/uneven terrain with LRAD at MOD I level to improve functional mobility.    Status New     PT LONG TERM GOAL #4   Title Pt will improve ABC score to >/=64% to improve confidence in balance and quality of life.    Status New               Plan - 11/30/15 1306    Clinical Impression Statement Pt is a pleasant 82y/o female presenting to OPPT neuro with gait instability and postural impairments. Pt's PMH significant for the following: HTN, heart disease (cardio consult 12/2015), hx of L THA in~2012, hx of thyroid CA (in 1950's), hypothyroidism, receives injections for R knee pain, L retinal detachment, hx of R foot fx in 2010. Pt presented with the following deficits: gait devaitions, impaired balance, decr. strength, decr. flexibillity, decr. endurance, and dizziness. Pt's gait speed and TUG time do not place pt at risk for falls, however, pt has experienced 3 falls in the last 6 months. PT will administer the BERG next session for  a formal balance assessment. Pt would benefit from PT in order to improve the deficts listed below.    Rehab Potential Good   Clinical Impairments Affecting Rehab Potential co-morbidities   PT Frequency 2x / week   PT Duration 4 weeks   PT Treatment/Interventions ADLs/Self Care Home Management;Biofeedback;Canalith Repostioning;Neuromuscular re-education;Balance training;Therapeutic exercise;Therapeutic activities;Functional mobility training;Stair training;Gait training;DME Instruction;Orthotic Fit/Training;Patient/family education;Manual techniques;Vestibular   PT Next Visit Plan Perform BERG and 6MWT and write goals. Initiate HEP.    Consulted and Agree with Plan of Care Patient      Patient will benefit from skilled therapeutic intervention in order to improve the following deficits and impairments:  Abnormal gait, Decreased endurance, Dizziness, Impaired flexibility, Postural dysfunction, Decreased balance, Decreased mobility, Decreased knowledge of use of DME, Decreased strength, Increased edema (Pt with hx of chronic LBP, PT will closely monitor pain but will not directly address as pt has had OPPT for back pain)  Visit Diagnosis: Other abnormalities of gait and mobility - Plan: PT plan of care cert/re-cert  Muscle weakness (generalized) - Plan: PT plan of care cert/re-cert      G-Codes - [phone] [id number]    Functional Assessment Tool Used Feet together EC: mn A, gait speed no AD : 2.82ft/sec, TUG no AD: 9.7sec.   Functional Limitation Mobility: Walking and moving around   Mobility: Walking and Moving Around Current Status [phone]) At least 40 percent but less than 60 percent impaired, limited or restricted   Mobility: Walking and Moving Around  Goal Status [phone]) At least 1 percent but less than 20 percent impaired, limited or restricted       Problem List Patient Active Problem List   Diagnosis Date Noted  . Nipple discharge in female 04/17/2011  . Heart disease, hypertensive    . Hypercholesterolemia   . Arthritis   . Unsteady gait   . Myalgia   . Pain in joints   . Retinal detachment   . Hypothyroidism   . GERD (gastroesophageal reflux disease)   . UTI (urinary tract infection)   . Vomiting   . Obesity   . GI bleed     Zairah Arista L 11/30/2015, 1:16 PM  [facility] [address] [place], [place], [address] Phone: [phone]   Fax:  [phone]  Name: MARGORIE OTWELL MRN: [id number] Date of Birth: [birth year]  Geoffry Paradise, PT,DPT 11/30/15 1:17 PM Phone: [phone] Fax: [phone]

## 2015-12-06 ENCOUNTER — Ambulatory Visit: Payer: Medicare Other | Admitting: Physical Therapy

## 2015-12-08 ENCOUNTER — Ambulatory Visit: Payer: Medicare Other | Admitting: Physical Therapy

## 2015-12-08 DIAGNOSIS — M6281 Muscle weakness (generalized): Secondary | ICD-10-CM | POA: Diagnosis not present

## 2015-12-08 DIAGNOSIS — R2689 Other abnormalities of gait and mobility: Secondary | ICD-10-CM

## 2015-12-08 NOTE — Therapy (Addendum)
[facility] [address] [place], [place], [address] Phone: [phone]   Fax:  [phone]  Physical Therapy Treatment  Patient Details  Name: Kathy Murphy MRN: [id number] Date of Birth: October 21, 1927 Referring Provider: Dr. Reynaldo Minium  Encounter Date: 12/08/2015      PT End of Session - 12/08/15 1544    Visit Number 2   Number of Visits 9   Date for PT Re-Evaluation 12/30/15   Authorization Type G-CODE AND PROGRESS NOTE EVERY 10TH VISIT.    PT Start Time 1450   PT Stop Time 1537   PT Time Calculation (min) 47 min   Equipment Utilized During Treatment Gait belt  min guard, min A and S prn   Activity Tolerance Patient tolerated treatment well   Behavior During Therapy WFL for tasks assessed/performed      Past Medical History:  Diagnosis Date  . Arthritis   . Cancer ([place])    thyroid  . GERD (gastroesophageal reflux disease)   . GI bleed 1980   AFTER POLYP EXCISION  . Heart disease, hypertensive   . Hypercholesterolemia   . Hypertension   . Hypothyroidism   . Myalgia   . Obesity   . Pain in joints   . Retinal detachment   . Unsteady gait   . UTI (urinary tract infection)   . Vomiting     Past Surgical History:  Procedure Laterality Date  . ABDOMINAL HYSTERECTOMY  1977  . BREAST BIOPSY    . BREAST LUMPECTOMY     x5  . CERVICAL LAMINECTOMY    . [place]  . FOOT SURGERY    . NECK SURGERY    . THYROIDECTOMY  1957  . TOTAL HIP ARTHROPLASTY  2012   left    There were no vitals filed for this visit.      Subjective Assessment - 12/08/15 1457    Subjective No falls since last visit. Pt has an appointment with cardiologist next Thrusday because she has SOB when walking.   Pertinent History HTN, heart disease (cardio consult 12/2015), hx of L THA in~2012, hx of thyroid CA (in 1950's), hypothyroidism, receives injections for R knee pain, L retinal detachment, hx of R foot fx in 2010   How  long can you stand comfortably?     Patient Stated Goals "To stand up straighter" and walk with better balance.    Currently in Pain? No/denies             [facility] Adult PT Treatment/Exercise - 12/08/15 0001      Ambulation/Gait   Ambulation/Gait Yes   Ambulation/Gait Assistance 5: Supervision   Ambulation/Gait Assistance Details 6 min walk test 833' with 2 seated rest breaks   Ambulation Distance (Feet) 833 Feet  with 2 seated rest breaks   Assistive device Straight cane   Gait Pattern Step-through pattern;Decreased stride length;Trendelenburg;Lateral trunk lean to right;Antalgic   Ambulation Surface Level;Outdoor     Standardized Balance Assessment   Standardized Balance Assessment Furniture conservator/restorer     Berg Balance Test   Sit to Stand Able to stand without using hands and stabilize independently   Standing Unsupported Able to stand safely 2 minutes   Sitting with Back Unsupported but Feet Supported on Floor or Stool Able to sit safely and securely 2 minutes   Stand to Sit Sits safely with minimal use of hands   Transfers Able to transfer safely, minor use of hands   Standing Unsupported  with Eyes Closed Able to stand 10 seconds with supervision   Standing Ubsupported with Feet Together Able to place feet together independently and stand for 1 minute with supervision   From Standing, Reach Forward with Outstretched Arm Reaches forward but needs supervision   From Standing Position, Pick up Object from [place] to pick up shoe safely and easily   From Standing Position, Turn to Look Behind Over each Shoulder Looks behind one side only/other side shows less weight shift   Turn 360 Degrees Needs close supervision or verbal cueing  very fast   Standing Unsupported, Alternately Place Feet on Step/Stool Able to complete 4 steps without aid or supervision   Standing Unsupported, One Foot in [place] to plae foot ahead of the other independently and hold 30 seconds   Standing on  One Leg Tries to lift leg/unable to hold 3 seconds but remains standing independently   Total Score 41                PT Education - 12/08/15 1543    Education provided Yes   Education Details Discussed results on tests   Person(s) Educated Patient   Methods Explanation   Comprehension Verbalized understanding          PT Short Term Goals - 11/30/15 1311      PT SHORT TERM GOAL #1   Title same as LTGs.            PT Long Term Goals - 12/08/15 1636      PT LONG TERM GOAL #1   Title Pt will be IND in HEP to improve endurance, strength, and balance. TARGET DATE FOR ALL LTGS: 12/28/15   Status New     PT LONG TERM GOAL #2   Title Pt will improve BERG score to >/=46/56 to decr. falls. risk.    Baseline 41/56   Status Revised     PT LONG TERM GOAL #3   Title Pt will ambulate 500' over even/uneven terrain with LRAD at MOD I level to improve functional mobility.    Status New     PT LONG TERM GOAL #4   Title Pt will improve ABC score to >/=64% to improve confidence in balance and quality of life.    Status New     PT LONG TERM GOAL #5   Title Pt will improve 6MWT distance from 833' to 1033' to improve endurance.   Status New               Plan - 12/08/15 1545    Clinical Impression Statement Pt scored 41/56 on Berg and walked 833' with 2 rest breaks, using SPC during 6MWT ; pt demonstrating high fall risk, instability, and poor activity tolerance.   Rehab Potential Good   Clinical Impairments Affecting Rehab Potential co-morbidities   PT Frequency 2x / week   PT Duration 4 weeks   PT Treatment/Interventions ADLs/Self Care Home Management;Biofeedback;Canalith Repostioning;Neuromuscular re-education;Balance training;Therapeutic exercise;Therapeutic activities;Functional mobility training;Stair training;Gait training;DME Instruction;Orthotic Fit/Training;Patient/family education;Manual techniques;Vestibular   PT Next Visit Plan Initiate HEP.    Consulted  and Agree with Plan of Care Patient      Patient will benefit from skilled therapeutic intervention in order to improve the following deficits and impairments:  Abnormal gait, Decreased endurance, Dizziness, Impaired flexibility, Postural dysfunction, Decreased balance, Decreased mobility, Decreased knowledge of use of DME, Decreased strength, Increased edema (Pt with hx of chronic LBP, PT will closely monitor pain but will not directly address  as pt has had OPPT for back pain)  Visit Diagnosis: Other abnormalities of gait and mobility  Muscle weakness (generalized)     Problem List Patient Active Problem List   Diagnosis Date Noted  . Nipple discharge in female 04/17/2011  . Heart disease, hypertensive   . Hypercholesterolemia   . Arthritis   . Unsteady gait   . Myalgia   . Pain in joints   . Retinal detachment   . Hypothyroidism   . GERD (gastroesophageal reflux disease)   . UTI (urinary tract infection)   . Vomiting   . Obesity   . GI bleed     Bjorn Loser, PTA  12/08/15, 4:37 PM [facility] [address] [place], [place], [address] Phone: [phone]   Fax:  [phone]  Name: Kathy Murphy MRN: [id number] Date of Birth: [birth year]   Goals updated by PT. Geoffry Paradise, PT,DPT 12/08/15 4:38 PM Phone: [phone] Fax: [phone]

## 2015-12-13 ENCOUNTER — Ambulatory Visit: Payer: Medicare Other | Attending: Internal Medicine | Admitting: Physical Therapy

## 2015-12-13 DIAGNOSIS — M6281 Muscle weakness (generalized): Secondary | ICD-10-CM

## 2015-12-13 DIAGNOSIS — R2689 Other abnormalities of gait and mobility: Secondary | ICD-10-CM | POA: Insufficient documentation

## 2015-12-13 NOTE — Therapy (Signed)
[facility] [address] [place], [place], [address] Phone: [phone]   Fax:  [phone]  Physical T3herapy Treatment  Patient Details  Name: Kathy Murphy MRN: [id number] Date of Birth: 1927-06-21 Referring Provider: Dr. Reynaldo Minium  Encounter Date: 12/13/2015      PT End of Session - 12/13/15 1539    Visit Number 3   Number of Visits 9   Date for PT Re-Evaluation 12/30/15   Authorization Type G-CODE AND PROGRESS NOTE EVERY 10TH VISIT.    PT Start Time 1447   PT Stop Time 1530   PT Time Calculation (min) 43 min   Equipment Utilized During Treatment Gait belt  min guard, min A and S prn   Activity Tolerance Patient tolerated treatment well   Behavior During Therapy WFL for tasks assessed/performed      Past Medical History:  Diagnosis Date  . Arthritis   . Cancer ([place])    thyroid  . GERD (gastroesophageal reflux disease)   . GI bleed 1980   AFTER POLYP EXCISION  . Heart disease, hypertensive   . Hypercholesterolemia   . Hypertension   . Hypothyroidism   . Myalgia   . Obesity   . Pain in joints   . Retinal detachment   . Unsteady gait   . UTI (urinary tract infection)   . Vomiting     Past Surgical History:  Procedure Laterality Date  . ABDOMINAL HYSTERECTOMY  1977  . BREAST BIOPSY    . BREAST LUMPECTOMY     x5  . CERVICAL LAMINECTOMY    . [place]  . FOOT SURGERY    . NECK SURGERY    . THYROIDECTOMY  1957  . TOTAL HIP ARTHROPLASTY  2012   left    There were no vitals filed for this visit.      Subjective Assessment - 12/13/15 1435    Subjective Pt fell Thursday picking object off the ground (slid off a chair); pull up on a rail to get up. Right knee and foot feel strained possibly from the fall. Pt has a prescription for a rollator but hasn't been able to get it filled yet.   Pertinent History HTN, heart disease (cardio consult 12/2015), hx of L THA in~2012, hx of thyroid  CA (in 1950's), hypothyroidism, receives injections for R knee pain, L retinal detachment, hx of R foot fx in 2010   How long can you stand comfortably?     Patient Stated Goals "To stand up straighter" and walk with better balance.    Currently in Pain? No/denies                         [facility] Adult PT Treatment/Exercise - 12/13/15 0001      Ambulation/Gait   Ambulation/Gait Yes   Ambulation/Gait Assistance 5: Supervision   Ambulation/Gait Assistance Details Walking for endurance   Ambulation Distance (Feet) 300 Feet  15min continuous   Assistive device Straight cane   Gait Pattern Step-through pattern;Decreased stride length;Trendelenburg;Lateral trunk lean to right;Antalgic   Ambulation Surface Level;Indoor     Knee/Hip Exercises: Aerobic   Other Aerobic Sci fit level 1.0, 68min continuous             Balance Exercises - 12/13/15 1453      [facility] PROGRAM   Head Movements Standing;5 reps   Neck Movements Standing;5 reps   Back Extension Standing;5 reps   Trunk Movements Standing;5 reps  Ankle Movements Standing;10 reps   Knee Extensor 10 reps   Knee Flexor 10 reps   Hip ABductor 10 reps   Ankle Plantorflexors 20 reps, support   Ankle Dorsiflexors 20 reps, support           PT Education - 12/13/15 1454    Education provided Yes   Education Details [facility] HEP and walking program. Recommend pt look into getting a device like a Life Alert for safety   Person(s) Educated Patient   Methods Explanation;Demonstration;Verbal cues;Handout   Comprehension Verbalized understanding;Need further instruction;Returned demonstration;Verbal cues required          PT Short Term Goals - 11/30/15 1311      PT SHORT TERM GOAL #1   Title same as LTGs.            PT Long Term Goals - 12/08/15 1636      PT LONG TERM GOAL #1   Title Pt will be IND in HEP to improve endurance, strength, and balance. TARGET DATE FOR ALL LTGS: 12/28/15   Status New     PT  LONG TERM GOAL #2   Title Pt will improve BERG score to >/=46/56 to decr. falls. risk.    Baseline 41/56   Status Revised     PT LONG TERM GOAL #3   Title Pt will ambulate 500' over even/uneven terrain with LRAD at MOD I level to improve functional mobility.    Status New     PT LONG TERM GOAL #4   Title Pt will improve ABC score to >/=64% to improve confidence in balance and quality of life.    Status New     PT LONG TERM GOAL #5   Title Pt will improve 6MWT distance from 833' to 1033' to improve endurance.   Status New               Plan - 12/13/15 1459    Clinical Impression Statement Initiated [facility] HEP for ROM, strength, and balance and walking program.  Pt was challenged with above activity but tolerated well with slight increase in pain 0/10 to 2/10 in right knee, right hip, and back.   Rehab Potential Good   Clinical Impairments Affecting Rehab Potential co-morbidities   PT Frequency 2x / week   PT Duration 4 weeks   PT Treatment/Interventions ADLs/Self Care Home Management;Biofeedback;Canalith Repostioning;Neuromuscular re-education;Balance training;Therapeutic exercise;Therapeutic activities;Functional mobility training;Stair training;Gait training;DME Instruction;Orthotic Fit/Training;Patient/family education;Manual techniques;Vestibular   PT Next Visit Plan Review and progress [facility] HEP and check on walking program.   Consulted and Agree with Plan of Care Patient      Patient will benefit from skilled therapeutic intervention in order to improve the following deficits and impairments:  Abnormal gait, Decreased endurance, Dizziness, Impaired flexibility, Postural dysfunction, Decreased balance, Decreased mobility, Decreased knowledge of use of DME, Decreased strength, Increased edema (Pt with hx of chronic LBP, PT will closely monitor pain but will not directly address as pt has had OPPT for back pain)  Visit Diagnosis: Other abnormalities of gait and  mobility  Muscle weakness (generalized)     Problem List Patient Active Problem List   Diagnosis Date Noted  . Nipple discharge in female 04/17/2011  . Heart disease, hypertensive   . Hypercholesterolemia   . Arthritis   . Unsteady gait   . Myalgia   . Pain in joints   . Retinal detachment   . Hypothyroidism   . GERD (gastroesophageal reflux disease)   . UTI (urinary tract  infection)   . Vomiting   . Obesity   . GI bleed     Bjorn Loser, PTA  12/13/15, 3:42 PM [facility] [address] [place], [place], [address] Phone: [phone]   Fax:  [phone]  Name: ETTEL SIPOS MRN: [id number] Date of Birth: Jan 18, 1928

## 2015-12-13 NOTE — Patient Instructions (Signed)
Walking Program:  Begin walking for exercise for 3 minutes, 2-3 times/day, most days/week.   Progress your walking program by adding 1-2 minutes to your routine each week, as tolerated. Be sure to wear good walking shoes, walk in a safe environment and only progress to your tolerance.

## 2015-12-15 ENCOUNTER — Ambulatory Visit: Payer: Medicare Other | Admitting: Physical Therapy

## 2015-12-15 DIAGNOSIS — R2689 Other abnormalities of gait and mobility: Secondary | ICD-10-CM

## 2015-12-15 DIAGNOSIS — M6281 Muscle weakness (generalized): Secondary | ICD-10-CM

## 2015-12-15 NOTE — Therapy (Signed)
[facility] [address] [place], [place], [address] Phone: [phone]   Fax:  [phone]  Physical Therapy Treatment  Patient Details  Name: Kathy Murphy MRN: [id number] Date of Birth: 08/20/1927 Referring Provider: Dr. Reynaldo Minium  Encounter Date: 12/15/2015      PT End of Session - 12/15/15 1544    Visit Number 4   Number of Visits 9   Date for PT Re-Evaluation 12/30/15   Authorization Type G-CODE AND PROGRESS NOTE EVERY 10TH VISIT.    PT Start Time 1502   PT Stop Time 1540   PT Time Calculation (min) 38 min   Equipment Utilized During Treatment Gait belt  min guard, min A and S prn   Activity Tolerance Patient tolerated treatment well   Behavior During Therapy WFL for tasks assessed/performed      Past Medical History:  Diagnosis Date  . Arthritis   . Cancer ([place])    thyroid  . GERD (gastroesophageal reflux disease)   . GI bleed 1980   AFTER POLYP EXCISION  . Heart disease, hypertensive   . Hypercholesterolemia   . Hypertension   . Hypothyroidism   . Myalgia   . Obesity   . Pain in joints   . Retinal detachment   . Unsteady gait   . UTI (urinary tract infection)   . Vomiting     Past Surgical History:  Procedure Laterality Date  . ABDOMINAL HYSTERECTOMY  1977  . BREAST BIOPSY    . BREAST LUMPECTOMY     x5  . CERVICAL LAMINECTOMY    . [place]  . FOOT SURGERY    . NECK SURGERY    . THYROIDECTOMY  1957  . TOTAL HIP ARTHROPLASTY  2012   left    There were no vitals filed for this visit.      Subjective Assessment - 12/15/15 1503    Subjective "Right knee is more swollen and stiff today and it hurts when I put weight on it." Performed HEP since last visit.   Pertinent History HTN, heart disease (cardio consult 12/2015), hx of L THA in~2012, hx of thyroid CA (in 1950's), hypothyroidism, receives injections for R knee pain, L retinal detachment, hx of R foot fx in 2010   How  long can you stand comfortably?     Patient Stated Goals "To stand up straighter" and walk with better balance.    Currently in Pain? Yes   Pain Score 6    Pain Location Knee   Pain Orientation Right   Pain Descriptors / Indicators Tightness;Pins and needles   Pain Type Acute pain   Pain Onset In the past 7 days   Pain Frequency Intermittent   Aggravating Factors  Bending and weight bearing   Pain Relieving Factors sitting   Effect of Pain on Daily Activities stair negotiation                              Balance Exercises - 12/15/15 1508      [place] PROGRAM   Knee Bends 10 reps, support   Backwards Walking Support   Walking and Turning Around Building services engineer Walking Assistive device   Tandem Stance 10 seconds, support   Tandem Walk Support   One Leg Stand 10 seconds, support  Right foot only tolerated 5 seconds   Heel Walking Support   Toe Walk Support  PT Education - 12/15/15 1520    Education provided Yes   Education Details Progressed [place] HEP to include the balance exercises.   Person(s) Educated Patient   Methods Explanation;Demonstration;Tactile cues;Verbal cues;Handout   Comprehension Verbalized understanding;Returned demonstration;Need further instruction          PT Short Term Goals - 11/30/15 1311      PT SHORT TERM GOAL #1   Title same as LTGs.            PT Long Term Goals - 12/08/15 1636      PT LONG TERM GOAL #1   Title Pt will be IND in HEP to improve endurance, strength, and balance. TARGET DATE FOR ALL LTGS: 12/28/15   Status New     PT LONG TERM GOAL #2   Title Pt will improve BERG score to >/=46/56 to decr. falls. risk.    Baseline 41/56   Status Revised     PT LONG TERM GOAL #3   Title Pt will ambulate 500' over even/uneven terrain with LRAD at MOD I level to improve functional mobility.    Status New     PT LONG TERM GOAL #4   Title Pt will improve ABC score to >/=64% to improve  confidence in balance and quality of life.    Status New     PT LONG TERM GOAL #5   Title Pt will improve 6MWT distance from 833' to 1033' to improve endurance.   Status New               Plan - 12/15/15 1546    Clinical Impression Statement Pt requires UE support to standing balance exercises in [facility] HEP   Rehab Potential Good   Clinical Impairments Affecting Rehab Potential co-morbidities   PT Frequency 2x / week   PT Duration 4 weeks   PT Treatment/Interventions ADLs/Self Care Home Management;Biofeedback;Canalith Repostioning;Neuromuscular re-education;Balance training;Therapeutic exercise;Therapeutic activities;Functional mobility training;Stair training;Gait training;DME Instruction;Orthotic Fit/Training;Patient/family education;Manual techniques;Vestibular   PT Next Visit Plan Review and progress [place] HEP and check on walking program.      Patient will benefit from skilled therapeutic intervention in order to improve the following deficits and impairments:  Abnormal gait, Decreased endurance, Dizziness, Impaired flexibility, Postural dysfunction, Decreased balance, Decreased mobility, Decreased knowledge of use of DME, Decreased strength, Increased edema  Visit Diagnosis: Other abnormalities of gait and mobility  Muscle weakness (generalized)     Problem List Patient Active Problem List   Diagnosis Date Noted  . Nipple discharge in female 04/17/2011  . Heart disease, hypertensive   . Hypercholesterolemia   . Arthritis   . Unsteady gait   . Myalgia   . Pain in joints   . Retinal detachment   . Hypothyroidism   . GERD (gastroesophageal reflux disease)   . UTI (urinary tract infection)   . Vomiting   . Obesity   . GI bleed     Bjorn Loser, PTA  12/15/15, 3:48 PM [facility] [address] [place], [place], [address] Phone: [phone]   Fax:  [phone]  Name: Kathy Murphy MRN:  [id number] Date of Birth: [birth year]

## 2015-12-16 ENCOUNTER — Ambulatory Visit (INDEPENDENT_AMBULATORY_CARE_PROVIDER_SITE_OTHER): Payer: Medicare Other | Admitting: Cardiovascular Disease

## 2015-12-16 ENCOUNTER — Encounter: Payer: Self-pay | Admitting: Cardiovascular Disease

## 2015-12-16 VITALS — BP 140/84 | HR 74 | Ht 68.0 in | Wt 190.0 lb

## 2015-12-16 DIAGNOSIS — I119 Hypertensive heart disease without heart failure: Secondary | ICD-10-CM

## 2015-12-16 DIAGNOSIS — R0789 Other chest pain: Secondary | ICD-10-CM

## 2015-12-16 DIAGNOSIS — R0602 Shortness of breath: Secondary | ICD-10-CM | POA: Diagnosis not present

## 2015-12-16 NOTE — Progress Notes (Signed)
Cardiology onsultation Note    Date:  12/17/2015   ID:  Kathy Murphy, DOB December 11, 1927, MRN [id number]  PCP:  Geoffery [facility], MD  Cardiologist:   Sanda Klein, MD  Consultation requested for: Chest pain Chief Complaint  Patient presents with     . Chest Pain    History of Present Illness:  Kathy Murphy is a 82 y.o. female with complaints of chest discomfort at rest. She reports that several times she wakes up in the morning with a tight sensation in her retrosternal area that lasts for several minutes. It does not return when she is physically active such as walking. She does walk cautiously since she has had several falls and uses a cane. She has undergone a previous left hip replacement and has had a fracture of her right ankle. She reports feeling short of breath if she is in a hurry or if she becomes emotional. She does not have chest discomfort when this happens. In 2012 showed a normal nuclear perfusion study and an ejection fraction of 70%. The study was ordered by Dr. Doreatha Lew). Her electrocardiogram shows sinus rhythm and poor R-wave progression and is similar to a tracing from 2014.  She has treated hypertension and a family history of coronary disease but has never smoked and does not have diabetes mellitus or significant hyperlipidemia (LDL=121).  Review his chest CT did not mention any problems with coronary atherosclerosis/calcification.  Past Medical History:  Diagnosis Date  . Arthritis   . Cancer ([place])    thyroid  . GERD (gastroesophageal reflux disease)   . GI bleed 1980   AFTER POLYP EXCISION  . Heart disease, hypertensive   . Hypercholesterolemia   . Hypertension   . Hypothyroidism   . Myalgia   . Obesity   . Pain in joints   . Retinal detachment   . Unsteady gait   . UTI (urinary tract infection)   . Vomiting     Past Surgical History:  Procedure Laterality Date  . ABDOMINAL HYSTERECTOMY  1977  . BREAST BIOPSY    . BREAST LUMPECTOMY     x5    . CERVICAL LAMINECTOMY    . [place]  . FOOT SURGERY    . NECK SURGERY    . THYROIDECTOMY  1957  . TOTAL HIP ARTHROPLASTY  2012   left    Current Medications: Outpatient Medications Prior to Visit  Medication Sig Dispense Refill  . aspirin 81 MG tablet Take 81 mg by mouth daily.      . cefdinir (OMNICEF) 300 MG capsule Take 300 mg by mouth 2 (two) times daily.  0  . chlorpheniramine-HYDROcodone (TUSSIONEX) 10-8 MG/5ML LQCR   0  . diazepam (VALIUM) 2 MG tablet Take 2 mg by mouth every 6 (six) hours as needed for anxiety.    . Diclofenac Sodium CR 100 MG 24 hr tablet Take 100 mg by mouth daily.      Marland Kitchen estrogens, conjugated, (PREMARIN) 0.9 MG tablet Take 0.9 mg by mouth daily. Take daily for 21 days then do not take for 7 days.     Marland Kitchen HYDROcodone-acetaminophen (NORCO/VICODIN) 5-325 MG tablet Take 1 tablet by mouth every 6 (six) hours as needed for moderate pain.    Marland Kitchen levothyroxine (SYNTHROID, LEVOTHROID) 137 MCG tablet Take 137 mcg by mouth daily.      Marland Kitchen losartan (COZAAR) 100 MG tablet daily.    . metoprolol succinate (TOPROL-XL) 50 MG 24 hr tablet Take 50  mg by mouth daily. Take with or immediately following a meal.    . naproxen sodium (ANAPROX) 220 MG tablet Take 220 mg by mouth daily as needed.    . pantoprazole (PROTONIX) 40 MG tablet Take 40 mg by mouth daily.      . promethazine-codeine (PHENERGAN WITH CODEINE) 6.25-10 MG/5ML syrup   0  . nebivolol (BYSTOLIC) 10 MG tablet Take 10 mg by mouth daily.       No facility-administered medications prior to visit.      Allergies:   Review of patient's allergies indicates no known allergies.   Social History   Social History  . Marital status: Widowed    Spouse name: N/A  . Number of children: N/A  . Years of education: N/A   Social History Main Topics  . Smoking status: Never Smoker  . Smokeless tobacco: Never Used  . Alcohol use No  . Drug use: No  . Sexual activity: Not Asked   Other Topics Concern  . None    Social History Narrative  . None     Family History:  The patient's family history includes Heart disease in her mother.   ROS:   Please see the history of present illness.    ROS All other systems reviewed and are negative.   PHYSICAL EXAM:   VS:  BP 140/84   Pulse 74   Ht 5\' 8"  (1.727 m)   Wt 190 lb (86.2 kg)   BMI 28.89 kg/m    GEN: Well nourished, well developed, in no acute distress  HEENT: normal  Neck: no JVD, carotid bruits, or masses Cardiac: RRR; no murmurs, rubs, or gallops,no edema  Respiratory:  clear to auscultation bilaterally, normal work of breathing GI: soft, nontender, nondistended, + BS MS: no deformity or atrophy  Skin: warm and dry, no rash Neuro:  Alert and Oriented x 3, Strength and sensation are intact Psych: euthymic mood, full affect  Wt Readings from Last 3 Encounters:  12/16/15 190 lb (86.2 kg)  10/19/11 189 lb 6.4 oz (85.9 kg)  07/26/11 187 lb 6.4 oz (85 kg)      Studies/Labs Reviewed:   EKG:  EKG is ordered today.  The ekg ordered today demonstrates Normal sinus rhythm with first-degree AV block (PR interval 2 and 46 ms), poor R-wave progression from V1-V6, no repolarization changes, QTc 441 ms. The ECG from Dr. Jacquiline Doe office from August is similar except that there is an R-wave finally visible in lead V6. The difference may be simply related to lead placement. An ECG from 2014 is also very similar.  Recent Labs: Potassium 4.2, creatinine 0.7, normal liver function tests, hemoglobin 14.1, glucose 86, TSH and free T4 normal  Lipid Panel Total cholesterol 196, triglycerides 133, HDL 48, LDL 121  Additional studies/ records that were reviewed today include:  Records from primary care provider    ASSESSMENT:    1. Other chest pain   2. Shortness of breath   3. Hypertensive heart disease without heart failure      PLAN:  In order of problems listed above:  1. CP: Atypical, but possibly representing early morning angina  pectoris. She has an abnormal electrocardiogram with virtually no R waves across the anterior precordium, but this is most likely a chronic abnormality. She is unable to exercise due to her orthopedic problems. Will order [employer]. 2. Dyspnea: Might be related to deconditioning and her overweight status, but requires further evaluation. Will check an echocardiogram. 3. HTN:  Well controlled 4. Overweight: Approaching obesity. She is aware of her weight problem and is striving to reduce intake of calories and carbohydrates.    Medication Adjustments/Labs and Tests Ordered: Current medicines are reviewed at length with the patient today.  Concerns regarding medicines are outlined above.  Medication changes, Labs and Tests ordered today are listed in the Patient Instructions below. Patient Instructions  Medication Instructions: Dr Sallyanne Kuster recommends that you continue on your current medications as directed. Please refer to the Current Medication list given to you today.  Labwork: NONE ORDERED  Testing/Procedures: 1. [place] Stress test - Your physician has requested that you have a lexiscan myoview. For further information please visit [email or web address]. Please follow instruction sheet, as given.  2. Echocardiogram - Your physician has requested that you have an echocardiogram. Echocardiography is a painless test that uses sound waves to create images of your heart. It provides your doctor with information about the size and shape of your heart and how well your heart's chambers and valves are working. This procedure takes approximately one hour. There are no restrictions for this procedure. This will be performed at our [facility] location - [address], [address].  Follow-up: Dr Sallyanne Kuster recommends that you schedule a follow-up appointment after testing.  If you need a refill on your cardiac medications before your next appointment, please call your pharmacy.     Signed, Sanda Klein, MD  12/17/2015 2:54 PM    [facility] [place], [place], [place]  [address] Phone: [phone]; Fax: [phone]

## 2015-12-16 NOTE — Patient Instructions (Signed)
Medication Instructions: Dr Sallyanne Kuster recommends that you continue on your current medications as directed. Please refer to the Current Medication list given to you today.  Labwork: NONE ORDERED  Testing/Procedures: 1. [place] Stress test - Your physician has requested that you have a lexiscan myoview. For further information please visit [email or web address]. Please follow instruction sheet, as given.  2. Echocardiogram - Your physician has requested that you have an echocardiogram. Echocardiography is a painless test that uses sound waves to create images of your heart. It provides your doctor with information about the size and shape of your heart and how well your heart's chambers and valves are working. This procedure takes approximately one hour. There are no restrictions for this procedure. This will be performed at our [facility] location - [address].  Follow-up: Dr Sallyanne Kuster recommends that you schedule a follow-up appointment after testing.  If you need a refill on your cardiac medications before your next appointment, please call your pharmacy.

## 2015-12-21 ENCOUNTER — Telehealth: Payer: Self-pay

## 2015-12-21 ENCOUNTER — Ambulatory Visit: Payer: Medicare Other

## 2015-12-21 VITALS — BP 158/89 | HR 77

## 2015-12-21 DIAGNOSIS — M6281 Muscle weakness (generalized): Secondary | ICD-10-CM

## 2015-12-21 DIAGNOSIS — R2689 Other abnormalities of gait and mobility: Secondary | ICD-10-CM

## 2015-12-21 NOTE — Telephone Encounter (Signed)
Dr. Sallyanne Kuster  I saw Kathy Murphy today for a physical therapy visit. However, I did not complete the visit as she reported she has a stress test scheduled for 12/30/15. Her resting BP was also slightly elevated, despite pt reporting she took her medications as prescribed.   Do you feel I should wait to resume PT until after the stress test, or is it safe to see Kathy Murphy prior to the testing?  Regardless, I will need an order to resume physical therapy and parameters set for her BP/HR.  Thank you, Kathy Murphy, PT,DPT 12/21/15 4:04 PM Phone: [phone] Fax: [phone]

## 2015-12-21 NOTE — Telephone Encounter (Signed)
Let's hold off physical therapy until after the stress test. It's only about a week away. If the stress test is normal it'll be okay to perform physical therapy as long as resting systolic blood pressure is less than 0000000, diastolic blood pressure less than 100, heart rate more than 50 and less than 100. I will try to remember to forward the stress test results to, but I apologize if I forget by then. I will definitely sending them to her primary care physician. Thank you!

## 2015-12-21 NOTE — Therapy (Signed)
[facility] [address] [place], [place], [address] Phone: [phone]   Fax:  [phone]  Physical Therapy Treatment  Patient Details  Name: Kathy Murphy MRN: [id number] Date of Birth: 22-Dec-1927 Referring Provider: Dr. Reynaldo Minium  Encounter Date: 12/21/2015      PT End of Session - 12/21/15 1553    Visit Number 4  no charge for today   Number of Visits 9   Date for PT Re-Evaluation 12/30/15   Authorization Type G-CODE AND PROGRESS NOTE EVERY 10TH VISIT.    PT Start Time 1535   PT Stop Time 1549   PT Time Calculation (min) 14 min   Behavior During Therapy WFL for tasks assessed/performed      Past Medical History:  Diagnosis Date  . Arthritis   . Cancer ([place])    thyroid  . GERD (gastroesophageal reflux disease)   . GI bleed 1980   AFTER POLYP EXCISION  . Heart disease, hypertensive   . Hypercholesterolemia   . Hypertension   . Hypothyroidism   . Myalgia   . Obesity   . Pain in joints   . Retinal detachment   . Unsteady gait   . UTI (urinary tract infection)   . Vomiting     Past Surgical History:  Procedure Laterality Date  . ABDOMINAL HYSTERECTOMY  1977  . BREAST BIOPSY    . BREAST LUMPECTOMY     x5  . CERVICAL LAMINECTOMY    . [place]  . FOOT SURGERY    . NECK SURGERY    . THYROIDECTOMY  1957  . TOTAL HIP ARTHROPLASTY  2012   left    Vitals:   12/21/15 1544  BP: (!) 158/89  Pulse: 77        Subjective Assessment - 12/21/15 1539    Subjective Pt denied falls since last visit. Pt went to the cardiologist and was told she had a MI between August 2017 and 12/2015, therefore, she now has a stress test scheduled for 12/30/15. Pt does not believe cardiologist was aware that pt was in PT. Pt reports her R knee and back are still bothering her.    Pertinent History HTN, heart disease (cardio consult 12/2015), hx of L THA in~2012, hx of thyroid CA (in 1950's), hypothyroidism,  receives injections for R knee pain, L retinal detachment, hx of R foot fx in 2010   Patient Stated Goals "To stand up straighter" and walk with better balance.    Currently in Pain? Yes                                 PT Education - 12/21/15 1552    Education provided Yes   Education Details PT discussed holding therapy until all cardiology testing complete and MD has cleared pt, or that PT can resume prior to test with MD clearance. PT will contact Dr. Sallyanne Kuster (cardiologist) for clarification.    Person(s) Educated Patient   Methods Explanation   Comprehension Verbalized understanding          PT Short Term Goals - 11/30/15 1311      PT SHORT TERM GOAL #1   Title same as LTGs.            PT Long Term Goals - 12/08/15 1636      PT LONG TERM GOAL #1   Title Pt will be IND in HEP to  improve endurance, strength, and balance. TARGET DATE FOR ALL LTGS: 12/28/15   Status New     PT LONG TERM GOAL #2   Title Pt will improve BERG score to >/=46/56 to decr. falls. risk.    Baseline 41/56   Status Revised     PT LONG TERM GOAL #3   Title Pt will ambulate 500' over even/uneven terrain with LRAD at MOD I level to improve functional mobility.    Status New     PT LONG TERM GOAL #4   Title Pt will improve ABC score to >/=64% to improve confidence in balance and quality of life.    Status New     PT LONG TERM GOAL #5   Title Pt will improve 6MWT distance from 833' to 1033' to improve endurance.   Status New               Plan - 12/21/15 1554    Clinical Impression Statement No charge for visit today, as has a stress test scheduled for 12/30/15 (per pt report and per pt's chart). Pt stated cardiologist told pt she likely had a MI between EKGs performed in 10/2015 and then in 12/2015. Pt's resting BP is also slightly elevated and pt reported she has taken all medications today, as prescribed by MD. PT will contact Dr. Sallyanne Kuster for clarification  regarding pt resuming PT before or after stress test with parameters.    Rehab Potential Good   Clinical Impairments Affecting Rehab Potential co-morbidities   PT Frequency 2x / week   PT Duration 4 weeks   PT Treatment/Interventions ADLs/Self Care Home Management;Biofeedback;Canalith Repostioning;Neuromuscular re-education;Balance training;Therapeutic exercise;Therapeutic activities;Functional mobility training;Stair training;Gait training;DME Instruction;Orthotic Fit/Training;Patient/family education;Manual techniques;Vestibular   PT Next Visit Plan Monitor vitals, check to see if Dr. Sallyanne Kuster has cleared pt to resume PT. Review and progress [place] HEP and check on walking program.   Consulted and Agree with Plan of Care Patient      Patient will benefit from skilled therapeutic intervention in order to improve the following deficits and impairments:  Abnormal gait, Decreased endurance, Dizziness, Impaired flexibility, Postural dysfunction, Decreased balance, Decreased mobility, Decreased knowledge of use of DME, Decreased strength, Increased edema  Visit Diagnosis: Other abnormalities of gait and mobility  Muscle weakness (generalized)     Problem List Patient Active Problem List   Diagnosis Date Noted  . Nipple discharge in female 04/17/2011  . Heart disease, hypertensive   . Hypercholesterolemia   . Arthritis   . Unsteady gait   . Myalgia   . Pain in joints   . Retinal detachment   . Hypothyroidism   . GERD (gastroesophageal reflux disease)   . UTI (urinary tract infection)   . Vomiting   . Obesity   . GI bleed     Denis Carreon L 12/21/2015, 3:58 PM  [facility] [address] [place], [place], [address] Phone: [phone]   Fax:  [phone]  Name: Kathy Murphy MRN: [id number] Date of Birth: 1928/02/04   Geoffry Paradise, PT,DPT 12/21/15 3:58 PM Phone: [phone] Fax: [phone]

## 2015-12-23 ENCOUNTER — Telehealth: Payer: Self-pay

## 2015-12-23 NOTE — Telephone Encounter (Signed)
PT spoke with patient and notified pt that Dr. Sallyanne Kuster would like to hold PT until after pt's stress test. PT educated pt that we would require a resume order from her MD after the testing is complete. PT notified pt that remaining PT appt's would be cancelled (as they are all prior to 12/30/15) and that she would need to call back to reschedule remaining visits, once she is medically cleared. Pt verbalized understanding and agreement.   Geoffry Paradise, PT,DPT 12/23/15 1:14 PM Phone: [phone] Fax: [phone]

## 2015-12-24 ENCOUNTER — Ambulatory Visit: Payer: Medicare Other

## 2015-12-27 ENCOUNTER — Ambulatory Visit: Payer: Managed Care, Other (non HMO) | Admitting: Physical Therapy

## 2015-12-28 ENCOUNTER — Telehealth (HOSPITAL_COMMUNITY): Payer: Self-pay

## 2015-12-28 NOTE — Telephone Encounter (Signed)
Encounter complete. 

## 2015-12-29 ENCOUNTER — Ambulatory Visit: Payer: Managed Care, Other (non HMO)

## 2015-12-30 ENCOUNTER — Ambulatory Visit (HOSPITAL_COMMUNITY)
Admission: RE | Admit: 2015-12-30 | Discharge: 2015-12-30 | Disposition: A | Discharge status: Home / Self Care | Payer: Medicare Other | Source: Ambulatory Visit | Attending: Cardiovascular Disease | Admitting: Cardiovascular Disease

## 2015-12-30 ENCOUNTER — Other Ambulatory Visit (HOSPITAL_COMMUNITY): Payer: Self-pay

## 2015-12-30 DIAGNOSIS — I10 Essential (primary) hypertension: Secondary | ICD-10-CM | POA: Diagnosis not present

## 2015-12-30 DIAGNOSIS — R0789 Other chest pain: Secondary | ICD-10-CM

## 2015-12-30 DIAGNOSIS — Z8249 Family history of ischemic heart disease and other diseases of the circulatory system: Secondary | ICD-10-CM | POA: Insufficient documentation

## 2015-12-30 DIAGNOSIS — R0602 Shortness of breath: Secondary | ICD-10-CM

## 2015-12-30 DIAGNOSIS — E039 Hypothyroidism, unspecified: Secondary | ICD-10-CM | POA: Insufficient documentation

## 2015-12-30 LAB — MYOCARDIAL PERFUSION IMAGING
CHL CUP NUCLEAR SDS: 1
CHL CUP RESTING HR STRESS: 75 {beats}/min
LV dias vol: 137 mL (ref 46–106)
LVSYSVOL: 78 mL
NUC STRESS TID: 1.02
Peak HR: 90 {beats}/min
SRS: 3
SSS: 4

## 2015-12-30 MED ORDER — TECHNETIUM TC 99M TETROFOSMIN IV KIT
31.9000 | PACK | Freq: Once | INTRAVENOUS | Status: AC | PRN
  Administered 2015-12-30: 31.9 via INTRAVENOUS
  Filled 2015-12-30: qty 32

## 2015-12-30 MED ORDER — REGADENOSON 0.4 MG/5ML IV SOLN
0.4000 mg | Freq: Once | INTRAVENOUS | Status: AC
  Administered 2015-12-30: 0.4 mg via INTRAVENOUS

## 2015-12-30 MED ORDER — AMINOPHYLLINE 25 MG/ML IV SOLN
75.0000 mg | Freq: Once | INTRAVENOUS | Status: AC
  Administered 2015-12-30: 75 mg via INTRAVENOUS

## 2015-12-30 MED ORDER — TECHNETIUM TC 99M TETROFOSMIN IV KIT
10.7000 | PACK | Freq: Once | INTRAVENOUS | Status: AC | PRN
  Administered 2015-12-30: 10.7 via INTRAVENOUS
  Filled 2015-12-30: qty 11

## 2016-01-04 ENCOUNTER — Other Ambulatory Visit (HOSPITAL_COMMUNITY): Payer: Medicare Other

## 2016-01-14 ENCOUNTER — Ambulatory Visit (HOSPITAL_COMMUNITY): Payer: Medicare Other | Attending: Cardiovascular Disease

## 2016-01-14 ENCOUNTER — Other Ambulatory Visit: Payer: Self-pay

## 2016-01-14 DIAGNOSIS — R0602 Shortness of breath: Secondary | ICD-10-CM | POA: Insufficient documentation

## 2016-01-14 DIAGNOSIS — R0789 Other chest pain: Secondary | ICD-10-CM | POA: Diagnosis not present

## 2016-01-14 LAB — ECHOCARDIOGRAM COMPLETE
Ao-asc: 38 cm
CHL CUP DOP CALC LVOT VTI: 18.3 cm
EERAT: 11.99
EWDT: 468 ms
FS: 15 % — AB (ref 28–44)
IVS/LV PW RATIO, ED: 0.91
LA vol A4C: 39 ml
LA vol: 36 mL
LADIAMINDEX: 1.95 cm/m2
LASIZE: 39 mm
LAVOLIN: 18 mL/m2
LEFT ATRIUM END SYS DIAM: 39 mm
LV E/e' medial: 11.99
LV E/e'average: 11.99
LV PW d: 9.96 mm — AB (ref 0.6–1.1)
LV SIMPSON'S DISK: 47
LV TDI E'LATERAL: 2.96
LV TDI E'MEDIAL: 2.41
LV dias vol index: 78 mL/m2
LV dias vol: 155 mL — AB (ref 46–106)
LV e' LATERAL: 2.96 cm/s
LVOT SV: 70 mL
LVOT area: 3.8 cm2
LVOT diameter: 22 mm
LVOTPV: 82.7 cm/s
LVSYSVOL: 82 mL — AB (ref 14–42)
LVSYSVOLIN: 41 mL/m2
MV Dec: 468
MV pk A vel: 95.8 m/s
MV pk E vel: 35.5 m/s
RV sys press: 30 mmHg
Reg peak vel: 235 cm/s
Stroke v: 73 ml
TRMAXVEL: 235 cm/s

## 2016-01-19 ENCOUNTER — Ambulatory Visit (INDEPENDENT_AMBULATORY_CARE_PROVIDER_SITE_OTHER): Payer: Medicare Other | Admitting: Cardiovascular Disease

## 2016-01-19 ENCOUNTER — Encounter: Payer: Self-pay | Admitting: Cardiovascular Disease

## 2016-01-19 ENCOUNTER — Encounter: Payer: Self-pay | Admitting: Interventional Cardiology

## 2016-01-19 VITALS — BP 172/88 | HR 79 | Ht 68.0 in | Wt 187.6 lb

## 2016-01-19 DIAGNOSIS — D689 Coagulation defect, unspecified: Secondary | ICD-10-CM | POA: Diagnosis not present

## 2016-01-19 DIAGNOSIS — R5383 Other fatigue: Secondary | ICD-10-CM

## 2016-01-19 DIAGNOSIS — I5022 Chronic systolic (congestive) heart failure: Secondary | ICD-10-CM

## 2016-01-19 DIAGNOSIS — E663 Overweight: Secondary | ICD-10-CM

## 2016-01-19 DIAGNOSIS — I110 Hypertensive heart disease with heart failure: Secondary | ICD-10-CM

## 2016-01-19 DIAGNOSIS — R0789 Other chest pain: Secondary | ICD-10-CM

## 2016-01-19 DIAGNOSIS — Z01812 Encounter for preprocedural laboratory examination: Secondary | ICD-10-CM

## 2016-01-19 NOTE — Progress Notes (Signed)
Cardiology onsultation Note    Date:  01/19/2016   ID:  NOZOMI Murphy, DOB [birth year], MRN [id number]  PCP:  Geoffery Lyons, MD  Cardiologist:   Sanda Klein, MD  Consultation requested for: Chest pain Chief Complaint  Patient presents with     . Chest Pain    History of Present Illness:  Kathy Murphy is a 82 y.o. female with complaints of chest discomfort upon waking in the morning, returning for follow-up after an echo and nuclear stress test. She is accompanied by her good friend [name], who she reports has medical power of attorney.  Both studies showed a similar abnormality: Mildly reduced left ventricular systolic function with an ejection fraction of 40-45% with a global pattern. There was no evidence of ischemia or infarction on the perfusion images. She describes mildly impaired functional status. She becomes easily tired and short of breath especially towards the end of the day, but is mostly limited by back and ankle pain, rather than dyspnea.  She has treated hypertension and a family history of coronary disease but has never smoked and does not have diabetes mellitus. She only has mild hyperlipidemia (LDL=121).  Past Medical History:  Diagnosis Date  . Arthritis   . Cancer ([place])    thyroid  . GERD (gastroesophageal reflux disease)   . GI bleed 1980   AFTER POLYP EXCISION  . Heart disease, hypertensive   . Hypercholesterolemia   . Hypertension   . Hypothyroidism   . Myalgia   . Obesity   . Pain in joints   . Retinal detachment   . Unsteady gait   . UTI (urinary tract infection)   . Vomiting     Past Surgical History:  Procedure Laterality Date  . ABDOMINAL HYSTERECTOMY  1977  . BREAST BIOPSY    . BREAST LUMPECTOMY     x5  . CERVICAL LAMINECTOMY    . [place]  . FOOT SURGERY    . NECK SURGERY    . THYROIDECTOMY  1957  . TOTAL HIP ARTHROPLASTY  2012   left    Current Medications: Outpatient Medications Prior to Visit    Medication Sig Dispense Refill  . aspirin 81 MG tablet Take 81 mg by mouth daily.      . chlorpheniramine-HYDROcodone (TUSSIONEX) 10-8 MG/5ML LQCR Take 5 mLs by mouth as needed.   0  . diazepam (VALIUM) 2 MG tablet Take 2 mg by mouth every 6 (six) hours as needed for anxiety.    . Diclofenac Sodium CR 100 MG 24 hr tablet Take 100 mg by mouth daily.      Marland Kitchen estrogens, conjugated, (PREMARIN) 0.9 MG tablet Take 0.9 mg by mouth daily. Take daily for 21 days then do not take for 7 days.     Marland Kitchen HYDROcodone-acetaminophen (NORCO/VICODIN) 5-325 MG tablet Take 1 tablet by mouth every 6 (six) hours as needed for moderate pain.    Marland Kitchen levothyroxine (SYNTHROID, LEVOTHROID) 137 MCG tablet Take 137 mcg by mouth daily.      Marland Kitchen losartan (COZAAR) 100 MG tablet daily.    . metoprolol succinate (TOPROL-XL) 50 MG 24 hr tablet Take 50 mg by mouth daily. Take with or immediately following a meal.    . naproxen sodium (ANAPROX) 220 MG tablet Take 220 mg by mouth daily as needed.    . pantoprazole (PROTONIX) 40 MG tablet Take 40 mg by mouth daily.       No facility-administered medications prior to  visit.      Allergies:   Patient has no known allergies.   Social History   Social History  . Marital status: Widowed    Spouse name: N/A  . Number of children: N/A  . Years of education: N/A   Social History Main Topics  . Smoking status: Never Smoker  . Smokeless tobacco: Never Used  . Alcohol use No  . Drug use: No  . Sexual activity: Not Asked   Other Topics Concern  . None   Social History Narrative  . None     Family History:  The patient's family history includes Heart disease in her mother.   ROS:   Please see the history of present illness.    ROS All other systems reviewed and are negative.   PHYSICAL EXAM:   VS:  BP (!) 172/88 (BP Location: Right Arm, Patient Position: Sitting, Cuff Size: Normal)   Pulse 79   Ht 5\' 8"  (1.727 m)   Wt 187 lb 9.6 oz (85.1 kg)   SpO2 93%   BMI 28.52 kg/m     GEN: Well nourished, well developed, in no acute distress  HEENT: normal  Neck: no JVD, carotid bruits, or masses Cardiac: RRR; no murmurs, rubs, or gallops,no edema  Respiratory:  clear to auscultation bilaterally, normal work of breathing GI: soft, nontender, nondistended, + BS MS: no deformity or atrophy  Skin: warm and dry, no rash Neuro:  Alert and Oriented x 3, Strength and sensation are intact Psych: euthymic mood, full affect  Wt Readings from Last 3 Encounters:  01/19/16 187 lb 9.6 oz (85.1 kg)  12/30/15 190 lb (86.2 kg)  12/16/15 190 lb (86.2 kg)      Studies/Labs Reviewed:   EKG:  EKG is not ordered today.  The ekg ordered at her last appointment demonstrates Normal sinus rhythm with first-degree AV block (PR interval 246 ms), poor R-wave progression from V1-V6, no repolarization changes, QTc 441 ms. The ECG from Dr. Jacquiline Doe office from August is similar except that there is an R-wave finally visible in lead V6. The difference may be simply related to lead placement. An ECG from 2014 is also very similar.  Recent Labs: Potassium 4.2, creatinine 0.7, normal liver function tests, hemoglobin 14.1, glucose 86, TSH and free T4 normal  Lipid Panel Total cholesterol 196, triglycerides 133, HDL 48, LDL 121  ASSESSMENT:    1. Chronic systolic congestive heart failure ([place])   2. Atypical chest pain   3. Hypertensive heart disease with heart failure ([place])   4. Overweight   5. Pre-procedure lab exam   6. Coagulation disorder ([place])   7. Other fatigue      PLAN:  In order of problems listed above:  1. CHF: She has mildly depressed left ventricular systolic function. Unclear if this is due to ischemic heart disease or nonischemic cardiomyopathy. She may have "balanced ischemia" and I recommended left heart catheterization and coronary angiography. Discussed how findings on this test will have a big impact on further treatment: Medical therapy versus percutaneous  revascularization versus bypass surgery. This procedure has been fully reviewed with the patient and written informed consent has been obtained. She is already taking maximum dose losartan and a modest dose of metoprolol succinate. Consider switching to a more potent angiotensin receptor blocker and gradually increasing the beta blocker dose. Will get a direct measurement of left ventricular end-diastolic pressure at cardiac catheterization and see if she would benefit from daily to diuretics. Reinforced the  importance of sodium restriction in her diet. 2. Atypical chest pain: Could represent early morning angina pectoris. Coronary angiography and revascularization if needed. Continue aspirin. If coronary disease is identified will recommend starting statin with target LDL<70. If she does not have coronary artery disease, may not need statin therapy. 3. HTN: Well controlled at her last appointment, surprisingly high today, possibly since she is nervous about the abnormal tests. No changes made to the medications today. 4. Overweight: Approaching obesity. She is aware of her weight problem and is striving to reduce intake of calories and carbohydrates.    Medication Adjustments/Labs and Tests Ordered: Current medicines are reviewed at length with the patient today.  Concerns regarding medicines are outlined above.  Medication changes, Labs and Tests ordered today are listed in the Patient Instructions below. Patient Instructions  Dr Sallyanne Kuster has requested that you have a left cardiac catheterization with an interventionalist. Cardiac catheterization is used to diagnose and/or treat various heart conditions. Doctors may recommend this procedure for a number of different reasons. The most common reason is to evaluate chest pain. Chest pain can be a symptom of coronary artery disease (CAD), and cardiac catheterization can show whether plaque is narrowing or blocking your heart's arteries. This procedure is  also used to evaluate the valves, as well as measure the blood flow and oxygen levels in different parts of your heart. For further information please visit [email or web address].   Following your catheterization, you will not be allowed to drive for 3 days.  No lifting, pushing, or pulling greater that 10 pounds is allowed for 1 week.  You will be required to have the following tests prior to the procedure:  1. Blood work - the blood work can be done no more than 7 days prior to the procedure. It can be done at any [facility] lab. There is one downstairs on the first floor of this building and one in the [facility] building [phone]).  Please HOLD the following medication(s): >>Losartan  on the day of the procedure  Signed, Sanda Klein, MD  01/19/2016 12:11 PM    [address], [place], [place]  [address] Phone: [phone]; Fax: [phone]

## 2016-01-19 NOTE — Patient Instructions (Signed)
Dr Sallyanne Kuster has requested that you have a left cardiac catheterization with an interventionalist. Cardiac catheterization is used to diagnose and/or treat various heart conditions. Doctors may recommend this procedure for a number of different reasons. The most common reason is to evaluate chest pain. Chest pain can be a symptom of coronary artery disease (CAD), and cardiac catheterization can show whether plaque is narrowing or blocking your heart's arteries. This procedure is also used to evaluate the valves, as well as measure the blood flow and oxygen levels in different parts of your heart. For further information please visit [email or web address].   Following your catheterization, you will not be allowed to drive for 3 days.  No lifting, pushing, or pulling greater that 10 pounds is allowed for 1 week.  You will be required to have the following tests prior to the procedure:  1. Blood work - the blood work can be done no more than 7 days prior to the procedure. It can be done at any [facility] lab. There is one downstairs on the first floor of this building and one in the [facility] building [phone]).  Please HOLD the following medication(s): >>Losartan

## 2016-01-31 DIAGNOSIS — D689 Coagulation defect, unspecified: Secondary | ICD-10-CM | POA: Diagnosis not present

## 2016-01-31 DIAGNOSIS — R5383 Other fatigue: Secondary | ICD-10-CM | POA: Diagnosis not present

## 2016-01-31 DIAGNOSIS — I5022 Chronic systolic (congestive) heart failure: Secondary | ICD-10-CM | POA: Diagnosis not present

## 2016-01-31 DIAGNOSIS — Z01812 Encounter for preprocedural laboratory examination: Secondary | ICD-10-CM | POA: Diagnosis not present

## 2016-01-31 LAB — CBC
HEMATOCRIT: 42.3 % (ref 35.0–45.0)
Hemoglobin: 13.7 g/dL (ref 11.7–15.5)
MCH: 30.1 pg (ref 27.0–33.0)
MCHC: 32.4 g/dL (ref 32.0–36.0)
MCV: 93 fL (ref 80.0–100.0)
MPV: 10 fL (ref 7.5–12.5)
PLATELETS: 207 10*3/uL (ref 140–400)
RBC: 4.55 MIL/uL (ref 3.80–5.10)
RDW: 13 % (ref 11.0–15.0)
WBC: 7.3 10*3/uL (ref 3.8–10.8)

## 2016-02-01 LAB — TSH: TSH: 0.89 mIU/L

## 2016-02-01 LAB — BASIC METABOLIC PANEL
BUN: 15 mg/dL (ref 7–25)
CHLORIDE: 104 mmol/L (ref 98–110)
CO2: 25 mmol/L (ref 20–31)
Calcium: 9 mg/dL (ref 8.6–10.4)
Creat: 0.82 mg/dL (ref 0.60–0.88)
Glucose, Bld: 134 mg/dL — ABNORMAL HIGH (ref 65–99)
Potassium: 3.9 mmol/L (ref 3.5–5.3)
Sodium: 137 mmol/L (ref 135–146)

## 2016-02-01 LAB — PROTIME-INR
INR: 1
PROTHROMBIN TIME: 10.3 s (ref 9.0–11.5)

## 2016-02-01 LAB — APTT: APTT: 29 s (ref 22–34)

## 2016-02-02 ENCOUNTER — Other Ambulatory Visit: Payer: Self-pay | Admitting: Cardiovascular Disease

## 2016-02-02 DIAGNOSIS — I5022 Chronic systolic (congestive) heart failure: Secondary | ICD-10-CM

## 2016-02-07 DIAGNOSIS — R931 Abnormal findings on diagnostic imaging of heart and coronary circulation: Secondary | ICD-10-CM

## 2016-02-07 DIAGNOSIS — R079 Chest pain, unspecified: Secondary | ICD-10-CM

## 2016-02-07 DIAGNOSIS — I214 Non-ST elevation (NSTEMI) myocardial infarction: Secondary | ICD-10-CM

## 2016-02-07 DIAGNOSIS — R9439 Abnormal result of other cardiovascular function study: Secondary | ICD-10-CM

## 2016-02-08 ENCOUNTER — Encounter (HOSPITAL_COMMUNITY)
Admission: RE | Disposition: A | Discharge status: Home / Self Care | Payer: Self-pay | Source: Ambulatory Visit | Attending: Interventional Cardiology

## 2016-02-08 ENCOUNTER — Encounter (HOSPITAL_COMMUNITY): Payer: Self-pay | Admitting: Interventional Cardiology

## 2016-02-08 ENCOUNTER — Ambulatory Visit (HOSPITAL_COMMUNITY)
Admission: RE | Admit: 2016-02-08 | Discharge: 2016-02-08 | Disposition: A | Discharge status: Home / Self Care | Payer: Medicare Other | Source: Ambulatory Visit | Attending: Interventional Cardiology | Admitting: Interventional Cardiology

## 2016-02-08 DIAGNOSIS — E785 Hyperlipidemia, unspecified: Secondary | ICD-10-CM | POA: Diagnosis present

## 2016-02-08 DIAGNOSIS — E669 Obesity, unspecified: Secondary | ICD-10-CM | POA: Insufficient documentation

## 2016-02-08 DIAGNOSIS — I214 Non-ST elevation (NSTEMI) myocardial infarction: Secondary | ICD-10-CM

## 2016-02-08 DIAGNOSIS — I25118 Atherosclerotic heart disease of native coronary artery with other forms of angina pectoris: Secondary | ICD-10-CM | POA: Diagnosis not present

## 2016-02-08 DIAGNOSIS — Z79890 Hormone replacement therapy (postmenopausal): Secondary | ICD-10-CM | POA: Diagnosis not present

## 2016-02-08 DIAGNOSIS — E039 Hypothyroidism, unspecified: Secondary | ICD-10-CM | POA: Diagnosis not present

## 2016-02-08 DIAGNOSIS — Z79899 Other long term (current) drug therapy: Secondary | ICD-10-CM | POA: Insufficient documentation

## 2016-02-08 DIAGNOSIS — I209 Angina pectoris, unspecified: Secondary | ICD-10-CM | POA: Diagnosis present

## 2016-02-08 DIAGNOSIS — R9439 Abnormal result of other cardiovascular function study: Secondary | ICD-10-CM

## 2016-02-08 DIAGNOSIS — I119 Hypertensive heart disease without heart failure: Secondary | ICD-10-CM | POA: Diagnosis present

## 2016-02-08 DIAGNOSIS — K219 Gastro-esophageal reflux disease without esophagitis: Secondary | ICD-10-CM | POA: Diagnosis not present

## 2016-02-08 DIAGNOSIS — I5022 Chronic systolic (congestive) heart failure: Secondary | ICD-10-CM | POA: Diagnosis not present

## 2016-02-08 DIAGNOSIS — I110 Hypertensive heart disease with heart failure: Secondary | ICD-10-CM | POA: Diagnosis present

## 2016-02-08 DIAGNOSIS — R079 Chest pain, unspecified: Secondary | ICD-10-CM

## 2016-02-08 DIAGNOSIS — Z6827 Body mass index (BMI) 27.0-27.9, adult: Secondary | ICD-10-CM | POA: Insufficient documentation

## 2016-02-08 DIAGNOSIS — Z7982 Long term (current) use of aspirin: Secondary | ICD-10-CM | POA: Insufficient documentation

## 2016-02-08 DIAGNOSIS — E7800 Pure hypercholesterolemia, unspecified: Secondary | ICD-10-CM | POA: Diagnosis present

## 2016-02-08 DIAGNOSIS — R931 Abnormal findings on diagnostic imaging of heart and coronary circulation: Secondary | ICD-10-CM

## 2016-02-08 HISTORY — PX: CARDIAC CATHETERIZATION: SHX172

## 2016-02-08 LAB — POCT ACTIVATED CLOTTING TIME: ACTIVATED CLOTTING TIME: 301 s

## 2016-02-08 SURGERY — LEFT HEART CATH AND CORONARY ANGIOGRAPHY

## 2016-02-08 MED ORDER — SODIUM CHLORIDE 0.9% FLUSH: 3.0000 mL | Freq: Two times a day (BID) | INTRAVENOUS | Status: DC

## 2016-02-08 MED ORDER — ASPIRIN 81 MG PO CHEW: 81.0000 mg | CHEWABLE_TABLET | Freq: Every day | ORAL | Status: DC

## 2016-02-08 MED ORDER — VERAPAMIL HCL 2.5 MG/ML IV SOLN
INTRAVENOUS | Status: AC
  Filled 2016-02-08: qty 2

## 2016-02-08 MED ORDER — VERAPAMIL HCL 2.5 MG/ML IV SOLN
INTRAVENOUS | Status: DC | PRN
  Administered 2016-02-08: 10 mL via INTRA_ARTERIAL

## 2016-02-08 MED ORDER — ACETAMINOPHEN 325 MG PO TABS: 650.0000 mg | ORAL_TABLET | ORAL | Status: DC | PRN

## 2016-02-08 MED ORDER — HEPARIN (PORCINE) IN NACL 2-0.9 UNIT/ML-% IJ SOLN
INTRAMUSCULAR | Status: DC | PRN
  Administered 2016-02-08: 1500 mL

## 2016-02-08 MED ORDER — FENTANYL CITRATE (PF) 100 MCG/2ML IJ SOLN
INTRAMUSCULAR | Status: DC | PRN
  Administered 2016-02-08 (×2): 50 ug via INTRAVENOUS

## 2016-02-08 MED ORDER — HEPARIN SODIUM (PORCINE) 1000 UNIT/ML IJ SOLN
INTRAMUSCULAR | Status: DC | PRN
  Administered 2016-02-08: 5000 [IU] via INTRAVENOUS
  Administered 2016-02-08: 4000 [IU] via INTRAVENOUS

## 2016-02-08 MED ORDER — CLOPIDOGREL BISULFATE 300 MG PO TABS
ORAL_TABLET | ORAL | Status: AC
Start: 2016-02-08 — End: 2016-02-08
  Filled 2016-02-08: qty 1

## 2016-02-08 MED ORDER — MIDAZOLAM HCL 2 MG/2ML IJ SOLN
INTRAMUSCULAR | Status: AC
  Filled 2016-02-08: qty 2

## 2016-02-08 MED ORDER — IOPAMIDOL (ISOVUE-370) INJECTION 76%
INTRAVENOUS | Status: AC
  Filled 2016-02-08: qty 100

## 2016-02-08 MED ORDER — ONDANSETRON HCL 4 MG/2ML IJ SOLN: 4.0000 mg | Freq: Four times a day (QID) | INTRAMUSCULAR | Status: DC | PRN

## 2016-02-08 MED ORDER — SODIUM CHLORIDE 0.9 % IV SOLN: INTRAVENOUS | Status: DC

## 2016-02-08 MED ORDER — CLOPIDOGREL BISULFATE 300 MG PO TABS
ORAL_TABLET | ORAL | Status: AC
  Filled 2016-02-08: qty 1

## 2016-02-08 MED ORDER — CLOPIDOGREL BISULFATE 75 MG PO TABS: 75.0000 mg | ORAL_TABLET | Freq: Every day | ORAL | Status: DC

## 2016-02-08 MED ORDER — HEPARIN (PORCINE) IN NACL 2-0.9 UNIT/ML-% IJ SOLN
INTRAMUSCULAR | Status: AC
  Filled 2016-02-08: qty 1000

## 2016-02-08 MED ORDER — FENTANYL CITRATE (PF) 100 MCG/2ML IJ SOLN
INTRAMUSCULAR | Status: AC
  Filled 2016-02-08: qty 2

## 2016-02-08 MED ORDER — ASPIRIN 81 MG PO CHEW
81.0000 mg | CHEWABLE_TABLET | ORAL | Status: DC
Start: 2016-02-08 — End: 2016-02-08

## 2016-02-08 MED ORDER — SODIUM CHLORIDE 0.9 % WEIGHT BASED INFUSION: 1.0000 mL/kg/h | INTRAVENOUS | Status: DC

## 2016-02-08 MED ORDER — HEPARIN SODIUM (PORCINE) 1000 UNIT/ML IJ SOLN
INTRAMUSCULAR | Status: AC
Start: 2016-02-08 — End: 2016-02-08
  Filled 2016-02-08: qty 1

## 2016-02-08 MED ORDER — SODIUM CHLORIDE 0.9% FLUSH: 3.0000 mL | INTRAVENOUS | Status: DC | PRN

## 2016-02-08 MED ORDER — SODIUM CHLORIDE 0.9 % WEIGHT BASED INFUSION
3.0000 mL/kg/h | INTRAVENOUS | Status: AC
  Administered 2016-02-08: 3 mL/kg/h via INTRAVENOUS

## 2016-02-08 MED ORDER — LIDOCAINE HCL (PF) 1 % IJ SOLN
INTRAMUSCULAR | Status: DC | PRN
  Administered 2016-02-08: 2 mL via INTRADERMAL

## 2016-02-08 MED ORDER — LIDOCAINE HCL (PF) 1 % IJ SOLN
INTRAMUSCULAR | Status: AC
  Filled 2016-02-08: qty 30

## 2016-02-08 MED ORDER — IOPAMIDOL (ISOVUE-370) INJECTION 76%
INTRAVENOUS | Status: DC | PRN
  Administered 2016-02-08: 120 mL via INTRA_ARTERIAL

## 2016-02-08 MED ORDER — CLOPIDOGREL BISULFATE 75 MG PO TABS: 75.0000 mg | ORAL_TABLET | Freq: Every day | ORAL | 11 refills | Status: DC

## 2016-02-08 MED ORDER — OXYCODONE-ACETAMINOPHEN 5-325 MG PO TABS: 1.0000 | ORAL_TABLET | ORAL | Status: DC | PRN

## 2016-02-08 MED ORDER — MIDAZOLAM HCL 2 MG/2ML IJ SOLN
INTRAMUSCULAR | Status: DC | PRN
  Administered 2016-02-08 (×2): 1 mg via INTRAVENOUS

## 2016-02-08 MED ORDER — SODIUM CHLORIDE 0.9 % IV SOLN: 250.0000 mL | INTRAVENOUS | Status: DC | PRN

## 2016-02-08 MED ORDER — CLOPIDOGREL BISULFATE 300 MG PO TABS
ORAL_TABLET | ORAL | Status: DC | PRN
  Administered 2016-02-08: 600 mg via ORAL

## 2016-02-08 SURGICAL SUPPLY — 14 items
CATH INFINITI 5 FR JL3.5 (CATHETERS) ×2 IMPLANT
CATH INFINITI JR4 5F (CATHETERS) ×2 IMPLANT
CATH VISTA GUIDE 6FR JR4 (CATHETERS) ×2 IMPLANT
DEVICE RAD COMP TR BAND LRG (VASCULAR PRODUCTS) ×2 IMPLANT
GLIDESHEATH SLEND A-KIT 6F 22G (SHEATH) ×2 IMPLANT
GUIDEWIRE INQWIRE 1.5J.035X260 (WIRE) IMPLANT
INQWIRE 1.5J .035X260CM (WIRE) ×3
KIT ENCORE 26 ADVANTAGE (KITS) ×2 IMPLANT
KIT HEART LEFT (KITS) ×3 IMPLANT
PACK CARDIAC CATHETERIZATION (CUSTOM PROCEDURE TRAY) ×3 IMPLANT
TRANSDUCER W/STOPCOCK (MISCELLANEOUS) ×3 IMPLANT
TUBING CIL FLEX 10 FLL-RA (TUBING) ×3 IMPLANT
WIRE FIGHTER CROSSING 190CM (WIRE) ×1 IMPLANT
WIRE MARVEL STR TIP 190CM (WIRE) ×2 IMPLANT

## 2016-02-08 NOTE — H&P (View-Only) (Signed)
Cardiology onsultation Note    Date:  01/19/2016   ID:  SMT. DOBBERTIN, DOB Jun 02, 1927, MRN [id number]  PCP:  Geoffery Lyons, MD  Cardiologist:   Sanda Klein, MD  Consultation requested for: Chest pain Chief Complaint  Patient presents with     . Chest Pain    History of Present Illness:  Kathy Murphy is a 82 y.o. female with complaints of chest discomfort upon waking in the morning, returning for follow-up after an echo and nuclear stress test. She is accompanied by her good friend [name], who she reports has medical power of attorney.  Both studies showed a similar abnormality: Mildly reduced left ventricular systolic function with an ejection fraction of 40-45% with a global pattern. There was no evidence of ischemia or infarction on the perfusion images. She describes mildly impaired functional status. She becomes easily tired and short of breath especially towards the end of the day, but is mostly limited by back and ankle pain, rather than dyspnea.  She has treated hypertension and a family history of coronary disease but has never smoked and does not have diabetes mellitus. She only has mild hyperlipidemia (LDL=121).  Past Medical History:  Diagnosis Date  . Arthritis   . Cancer ([place])    thyroid  . GERD (gastroesophageal reflux disease)   . GI bleed 1980   AFTER POLYP EXCISION  . Heart disease, hypertensive   . Hypercholesterolemia   . Hypertension   . Hypothyroidism   . Myalgia   . Obesity   . Pain in joints   . Retinal detachment   . Unsteady gait   . UTI (urinary tract infection)   . Vomiting     Past Surgical History:  Procedure Laterality Date  . ABDOMINAL HYSTERECTOMY  1977  . BREAST BIOPSY    . BREAST LUMPECTOMY     x5  . CERVICAL LAMINECTOMY    . [place]  . FOOT SURGERY    . NECK SURGERY    . THYROIDECTOMY  1957  . TOTAL HIP ARTHROPLASTY  2012   left    Current Medications: Outpatient Medications Prior to Visit    Medication Sig Dispense Refill  . aspirin 81 MG tablet Take 81 mg by mouth daily.      . chlorpheniramine-HYDROcodone (TUSSIONEX) 10-8 MG/5ML LQCR Take 5 mLs by mouth as needed.   0  . diazepam (VALIUM) 2 MG tablet Take 2 mg by mouth every 6 (six) hours as needed for anxiety.    . Diclofenac Sodium CR 100 MG 24 hr tablet Take 100 mg by mouth daily.      Marland Kitchen estrogens, conjugated, (PREMARIN) 0.9 MG tablet Take 0.9 mg by mouth daily. Take daily for 21 days then do not take for 7 days.     Marland Kitchen HYDROcodone-acetaminophen (NORCO/VICODIN) 5-325 MG tablet Take 1 tablet by mouth every 6 (six) hours as needed for moderate pain.    Marland Kitchen levothyroxine (SYNTHROID, LEVOTHROID) 137 MCG tablet Take 137 mcg by mouth daily.      Marland Kitchen losartan (COZAAR) 100 MG tablet daily.    . metoprolol succinate (TOPROL-XL) 50 MG 24 hr tablet Take 50 mg by mouth daily. Take with or immediately following a meal.    . naproxen sodium (ANAPROX) 220 MG tablet Take 220 mg by mouth daily as needed.    . pantoprazole (PROTONIX) 40 MG tablet Take 40 mg by mouth daily.       No facility-administered medications prior to  visit.      Allergies:   Patient has no known allergies.   Social History   Social History  . Marital status: Widowed    Spouse name: N/A  . Number of children: N/A  . Years of education: N/A   Social History Main Topics  . Smoking status: Never Smoker  . Smokeless tobacco: Never Used  . Alcohol use No  . Drug use: No  . Sexual activity: Not Asked   Other Topics Concern  . None   Social History Narrative  . None     Family History:  The patient's family history includes Heart disease in her mother.   ROS:   Please see the history of present illness.    ROS All other systems reviewed and are negative.   PHYSICAL EXAM:   VS:  BP (!) 172/88 (BP Location: Right Arm, Patient Position: Sitting, Cuff Size: Normal)   Pulse 79   Ht 5\' 8"  (1.727 m)   Wt 187 lb 9.6 oz (85.1 kg)   SpO2 93%   BMI 28.52 kg/m     GEN: Well nourished, well developed, in no acute distress  HEENT: normal  Neck: no JVD, carotid bruits, or masses Cardiac: RRR; no murmurs, rubs, or gallops,no edema  Respiratory:  clear to auscultation bilaterally, normal work of breathing GI: soft, nontender, nondistended, + BS MS: no deformity or atrophy  Skin: warm and dry, no rash Neuro:  Alert and Oriented x 3, Strength and sensation are intact Psych: euthymic mood, full affect  Wt Readings from Last 3 Encounters:  01/19/16 187 lb 9.6 oz (85.1 kg)  12/30/15 190 lb (86.2 kg)  12/16/15 190 lb (86.2 kg)      Studies/Labs Reviewed:   EKG:  EKG is not ordered today.  The ekg ordered at her last appointment demonstrates Normal sinus rhythm with first-degree AV block (PR interval 246 ms), poor R-wave progression from V1-V6, no repolarization changes, QTc 441 ms. The ECG from Dr. Jacquiline Doe office from August is similar except that there is an R-wave finally visible in lead V6. The difference may be simply related to lead placement. An ECG from 2014 is also very similar.  Recent Labs: Potassium 4.2, creatinine 0.7, normal liver function tests, hemoglobin 14.1, glucose 86, TSH and free T4 normal  Lipid Panel Total cholesterol 196, triglycerides 133, HDL 48, LDL 121  ASSESSMENT:    1. Chronic systolic congestive heart failure ([place])   2. Atypical chest pain   3. Hypertensive heart disease with heart failure ([place])   4. Overweight   5. Pre-procedure lab exam   6. Coagulation disorder ([place])   7. Other fatigue      PLAN:  In order of problems listed above:  1. CHF: She has mildly depressed left ventricular systolic function. Unclear if this is due to ischemic heart disease or nonischemic cardiomyopathy. She may have "balanced ischemia" and I recommended left heart catheterization and coronary angiography. Discussed how findings on this test will have a big impact on further treatment: Medical therapy versus percutaneous  revascularization versus bypass surgery. This procedure has been fully reviewed with the patient and written informed consent has been obtained. She is already taking maximum dose losartan and a modest dose of metoprolol succinate. Consider switching to a more potent angiotensin receptor blocker and gradually increasing the beta blocker dose. Will get a direct measurement of left ventricular end-diastolic pressure at cardiac catheterization and see if she would benefit from daily to diuretics. Reinforced the  importance of sodium restriction in her diet. 2. Atypical chest pain: Could represent early morning angina pectoris. Coronary angiography and revascularization if needed. Continue aspirin. If coronary disease is identified will recommend starting statin with target LDL<70. If she does not have coronary artery disease, may not need statin therapy. 3. HTN: Well controlled at her last appointment, surprisingly high today, possibly since she is nervous about the abnormal tests. No changes made to the medications today. 4. Overweight: Approaching obesity. She is aware of her weight problem and is striving to reduce intake of calories and carbohydrates.    Medication Adjustments/Labs and Tests Ordered: Current medicines are reviewed at length with the patient today.  Concerns regarding medicines are outlined above.  Medication changes, Labs and Tests ordered today are listed in the Patient Instructions below. Patient Instructions  Dr Sallyanne Kuster has requested that you have a left cardiac catheterization with an interventionalist. Cardiac catheterization is used to diagnose and/or treat various heart conditions. Doctors may recommend this procedure for a number of different reasons. The most common reason is to evaluate chest pain. Chest pain can be a symptom of coronary artery disease (CAD), and cardiac catheterization can show whether plaque is narrowing or blocking your heart's arteries. This procedure is  also used to evaluate the valves, as well as measure the blood flow and oxygen levels in different parts of your heart. For further information please visit [email or web address].   Following your catheterization, you will not be allowed to drive for 3 days.  No lifting, pushing, or pulling greater that 10 pounds is allowed for 1 week.  You will be required to have the following tests prior to the procedure:  1. Blood work - the blood work can be done no more than 7 days prior to the procedure. It can be done at any [facility] lab. There is one downstairs on the first floor of this building and one in the [facility] building [phone]).  Please HOLD the following medication(s): >>Losartan  on the day of the procedure  Signed, Sanda Klein, MD  01/19/2016 12:11 PM    [address], [place], [place]  [address] Phone: [phone]; Fax: [phone]

## 2016-02-08 NOTE — Op Note (Signed)
   Subtotally occluded right coronary.  Second diagonal 90%.  50-60% proximal to mid LAD and diffuse distal LAD disease.  Moderate ramus intermedius disease.  Overall normal LV function.

## 2016-02-08 NOTE — Discharge Instructions (Signed)
RETURN TO SHORT STAY PROCEDURAL ON Friday December 1 AT 0830 AM FOR PROCEDURE AT 1030 AM TAKE PLAVIX AND ASPIRIN THAT MORNING AND OTHER MORNING MEDICATIONS EXCEPT LOSARTIN DO NOT TAKE LOSARTIN     Radial Site Care Introduction Refer to this sheet in the next few weeks. These instructions provide you with information about caring for yourself after your procedure. Your health care provider may also give you more specific instructions. Your treatment has been planned according to current medical practices, but problems sometimes occur. Call your health care provider if you have any problems or questions after your procedure. What can I expect after the procedure? After your procedure, it is typical to have the following:  Bruising at the radial site that usually fades within 1-2 weeks.  Blood collecting in the tissue (hematoma) that may be painful to the touch. It should usually decrease in size and tenderness within 1-2 weeks. Follow these instructions at home:  Take medicines only as directed by your health care provider.  You may shower 24-48 hours after the procedure or as directed by your health care provider. Remove the bandage (dressing) and gently wash the site with plain soap and water. Pat the area dry with a clean towel. Do not rub the site, because this may cause bleeding.  Do not take baths, swim, or use a hot tub until your health care provider approves.  Check your insertion site every day for redness, swelling, or drainage.  Do not apply powder or lotion to the site.  Do not flex or bend the affected arm for 24 hours or as directed by your health care provider.  Do not push or pull heavy objects with the affected arm for 24 hours or as directed by your health care provider.  Do not lift over 10 lb (4.5 kg) for 5 days after your procedure or as directed by your health care provider.  Ask your health care provider when it is okay to:  Return to work or school.  Resume  usual physical activities or sports.  Resume sexual activity.  Do not drive home if you are discharged the same day as the procedure. Have someone else drive you.  You may drive 24 hours after the procedure unless otherwise instructed by your health care provider.  Do not operate machinery or power tools for 24 hours after the procedure.  If your procedure was done as an outpatient procedure, which means that you went home the same day as your procedure, a responsible adult should be with you for the first 24 hours after you arrive home.  Keep all follow-up visits as directed by your health care provider. This is important. Contact a health care provider if:  You have a fever.  You have chills.  You have increased bleeding from the radial site. Hold pressure on the site. Get help right away if:  You have unusual pain at the radial site.  You have redness, warmth, or swelling at the radial site.  You have drainage (other than a small amount of blood on the dressing) from the radial site.  The radial site is bleeding, and the bleeding does not stop after 30 minutes of holding steady pressure on the site.  Your arm or hand becomes pale, cool, tingly, or numb. This information is not intended to replace advice given to you by your health care provider. Make sure you discuss any questions you have with your health care provider. Document Released: 04/01/2010 Document Revised: 08/05/2015  Document Reviewed: 09/15/2013  2017 Elsevier

## 2016-02-08 NOTE — Interval H&P Note (Signed)
Cath Lab Visit (complete for each Cath Lab visit)  Clinical Evaluation Leading to the Procedure:   ACS: No.  Non-ACS:    Anginal Classification: CCS III  Anti-ischemic medical therapy: Maximal Therapy (2 or more classes of medications)  Non-Invasive Test Results: Intermediate-risk stress test findings: cardiac mortality 1-3%/year  Prior CABG: No previous CABG      History and Physical Interval Note:  02/08/2016 7:31 AM  Kathy Murphy  has presented today for surgery, with the diagnosis of hf  The various methods of treatment have been discussed with the patient and family. After consideration of risks, benefits and other options for treatment, the patient has consented to  Procedure(s): Left Heart Cath and Coronary Angiography (N/A) as a surgical intervention .  The patient's history has been reviewed, patient examined, no change in status, stable for surgery.  I have reviewed the patient's chart and labs.  Questions were answered to the patient's satisfaction.     [name]

## 2016-02-09 MED FILL — Verapamil HCl IV Soln 2.5 MG/ML: INTRAVENOUS | Qty: 2 | Status: AC

## 2016-02-09 NOTE — Progress Notes (Signed)
Thanks! Nikki Rusnak 

## 2016-02-11 ENCOUNTER — Ambulatory Visit (HOSPITAL_COMMUNITY)
Admission: RE | Admit: 2016-02-11 | Discharge: 2016-02-13 | Disposition: A | Discharge status: Home / Self Care | Payer: Medicare Other | Source: Ambulatory Visit | Attending: Interventional Cardiology | Admitting: Interventional Cardiology

## 2016-02-11 ENCOUNTER — Encounter (HOSPITAL_COMMUNITY)
Admission: RE | Disposition: A | Discharge status: Home / Self Care | Payer: Self-pay | Source: Ambulatory Visit | Attending: Interventional Cardiology

## 2016-02-11 DIAGNOSIS — I10 Essential (primary) hypertension: Secondary | ICD-10-CM | POA: Diagnosis not present

## 2016-02-11 DIAGNOSIS — R000 Tachycardia, unspecified: Secondary | ICD-10-CM | POA: Insufficient documentation

## 2016-02-11 DIAGNOSIS — Z7982 Long term (current) use of aspirin: Secondary | ICD-10-CM | POA: Diagnosis not present

## 2016-02-11 DIAGNOSIS — K219 Gastro-esophageal reflux disease without esophagitis: Secondary | ICD-10-CM | POA: Insufficient documentation

## 2016-02-11 DIAGNOSIS — I2582 Chronic total occlusion of coronary artery: Secondary | ICD-10-CM | POA: Diagnosis not present

## 2016-02-11 DIAGNOSIS — Z79890 Hormone replacement therapy (postmenopausal): Secondary | ICD-10-CM | POA: Diagnosis not present

## 2016-02-11 DIAGNOSIS — I2510 Atherosclerotic heart disease of native coronary artery without angina pectoris: Secondary | ICD-10-CM | POA: Diagnosis present

## 2016-02-11 DIAGNOSIS — R931 Abnormal findings on diagnostic imaging of heart and coronary circulation: Secondary | ICD-10-CM | POA: Diagnosis present

## 2016-02-11 DIAGNOSIS — I214 Non-ST elevation (NSTEMI) myocardial infarction: Secondary | ICD-10-CM | POA: Diagnosis present

## 2016-02-11 DIAGNOSIS — E890 Postprocedural hypothyroidism: Secondary | ICD-10-CM | POA: Diagnosis not present

## 2016-02-11 DIAGNOSIS — Z79899 Other long term (current) drug therapy: Secondary | ICD-10-CM | POA: Insufficient documentation

## 2016-02-11 DIAGNOSIS — I493 Ventricular premature depolarization: Secondary | ICD-10-CM | POA: Insufficient documentation

## 2016-02-11 DIAGNOSIS — R079 Chest pain, unspecified: Secondary | ICD-10-CM | POA: Diagnosis present

## 2016-02-11 DIAGNOSIS — I25110 Atherosclerotic heart disease of native coronary artery with unstable angina pectoris: Secondary | ICD-10-CM | POA: Insufficient documentation

## 2016-02-11 DIAGNOSIS — Z955 Presence of coronary angioplasty implant and graft: Secondary | ICD-10-CM

## 2016-02-11 DIAGNOSIS — I208 Other forms of angina pectoris: Secondary | ICD-10-CM | POA: Diagnosis present

## 2016-02-11 DIAGNOSIS — I25118 Atherosclerotic heart disease of native coronary artery with other forms of angina pectoris: Secondary | ICD-10-CM | POA: Diagnosis not present

## 2016-02-11 DIAGNOSIS — I200 Unstable angina: Secondary | ICD-10-CM

## 2016-02-11 DIAGNOSIS — R9439 Abnormal result of other cardiovascular function study: Secondary | ICD-10-CM | POA: Diagnosis present

## 2016-02-11 HISTORY — PX: CARDIAC CATHETERIZATION: SHX172

## 2016-02-11 LAB — CBC
HCT: 41.1 % (ref 36.0–46.0)
Hemoglobin: 13.2 g/dL (ref 12.0–15.0)
MCH: 30.1 pg (ref 26.0–34.0)
MCHC: 32.1 g/dL (ref 30.0–36.0)
MCV: 93.8 fL (ref 78.0–100.0)
PLATELETS: 177 10*3/uL (ref 150–400)
RBC: 4.38 MIL/uL (ref 3.87–5.11)
RDW: 13.1 % (ref 11.5–15.5)
WBC: 6.3 10*3/uL (ref 4.0–10.5)

## 2016-02-11 LAB — BASIC METABOLIC PANEL
Anion gap: 10 (ref 5–15)
BUN: 14 mg/dL (ref 6–20)
CALCIUM: 8.9 mg/dL (ref 8.9–10.3)
CO2: 24 mmol/L (ref 22–32)
CREATININE: 0.71 mg/dL (ref 0.44–1.00)
Chloride: 104 mmol/L (ref 101–111)
GFR calc Af Amer: >60 mL/min (ref >60)
Glucose, Bld: 97 mg/dL (ref 65–99)
Potassium: 3.7 mmol/L (ref 3.5–5.1)
SODIUM: 138 mmol/L (ref 135–145)

## 2016-02-11 LAB — POCT ACTIVATED CLOTTING TIME
Activated Clotting Time: 285 seconds
Activated Clotting Time: 323 seconds

## 2016-02-11 SURGERY — CORONARY STENT INTERVENTION

## 2016-02-11 MED ORDER — ENOXAPARIN SODIUM 40 MG/0.4ML ~~LOC~~ SOLN
40.0000 mg | SUBCUTANEOUS | Status: DC
  Administered 2016-02-12: 40 mg via SUBCUTANEOUS
  Filled 2016-02-11 (×2): qty 0.4

## 2016-02-11 MED ORDER — IOPAMIDOL (ISOVUE-370) INJECTION 76%
INTRAVENOUS | Status: DC | PRN
  Administered 2016-02-11: 135 mL via INTRAVENOUS

## 2016-02-11 MED ORDER — SODIUM CHLORIDE 0.9 % WEIGHT BASED INFUSION: 1.0000 mL/kg/h | INTRAVENOUS | Status: AC

## 2016-02-11 MED ORDER — LIDOCAINE HCL (PF) 1 % IJ SOLN
INTRAMUSCULAR | Status: AC
  Filled 2016-02-11: qty 30

## 2016-02-11 MED ORDER — AIRBORNE PO LOZG: 1.0000 | LOZENGE | Freq: Every day | ORAL | Status: DC | PRN

## 2016-02-11 MED ORDER — HYDRALAZINE HCL 20 MG/ML IJ SOLN: 5.0000 mg | INTRAMUSCULAR | Status: AC | PRN

## 2016-02-11 MED ORDER — NITROGLYCERIN 1 MG/10 ML FOR IR/CATH LAB
INTRA_ARTERIAL | Status: AC
  Filled 2016-02-11: qty 10

## 2016-02-11 MED ORDER — CLOPIDOGREL BISULFATE 75 MG PO TABS
75.0000 mg | ORAL_TABLET | Freq: Every day | ORAL | Status: DC
  Administered 2016-02-12 – 2016-02-13 (×2): 75 mg via ORAL
  Filled 2016-02-11 (×2): qty 1

## 2016-02-11 MED ORDER — FENTANYL CITRATE (PF) 100 MCG/2ML IJ SOLN
INTRAMUSCULAR | Status: AC
  Filled 2016-02-11: qty 2

## 2016-02-11 MED ORDER — ASPIRIN 81 MG PO CHEW: 81.0000 mg | CHEWABLE_TABLET | ORAL | Status: DC

## 2016-02-11 MED ORDER — HEPARIN SODIUM (PORCINE) 1000 UNIT/ML IJ SOLN
INTRAMUSCULAR | Status: DC | PRN
  Administered 2016-02-11: 2000 [IU] via INTRAVENOUS
  Administered 2016-02-11: 8000 [IU] via INTRAVENOUS

## 2016-02-11 MED ORDER — HYDROCODONE-ACETAMINOPHEN 5-325 MG PO TABS: 1.0000 | ORAL_TABLET | Freq: Four times a day (QID) | ORAL | Status: DC | PRN

## 2016-02-11 MED ORDER — LEVOTHYROXINE SODIUM 25 MCG PO TABS
137.0000 ug | ORAL_TABLET | Freq: Every day | ORAL | Status: DC
  Administered 2016-02-12 – 2016-02-13 (×2): 137 ug via ORAL
  Filled 2016-02-11 (×2): qty 1

## 2016-02-11 MED ORDER — METOPROLOL SUCCINATE ER 50 MG PO TB24
50.0000 mg | ORAL_TABLET | Freq: Every day | ORAL | Status: DC
  Administered 2016-02-12: 08:00:00 50 mg via ORAL
  Filled 2016-02-11: qty 1

## 2016-02-11 MED ORDER — SODIUM CHLORIDE 0.9 % IV SOLN: 250.0000 mL | INTRAVENOUS | Status: DC | PRN

## 2016-02-11 MED ORDER — SODIUM CHLORIDE 0.9 % WEIGHT BASED INFUSION: 1.0000 mL/kg/h | INTRAVENOUS | Status: DC

## 2016-02-11 MED ORDER — ANGIOPLASTY BOOK
Freq: Once | Status: AC
  Administered 2016-02-11: 23:00:00
  Filled 2016-02-11: qty 1

## 2016-02-11 MED ORDER — HEPARIN (PORCINE) IN NACL 2-0.9 UNIT/ML-% IJ SOLN
INTRAMUSCULAR | Status: AC
  Filled 2016-02-11: qty 1000

## 2016-02-11 MED ORDER — IOPAMIDOL (ISOVUE-370) INJECTION 76%
INTRAVENOUS | Status: AC
  Filled 2016-02-11: qty 100

## 2016-02-11 MED ORDER — LABETALOL HCL 5 MG/ML IV SOLN: 10.0000 mg | INTRAVENOUS | Status: AC | PRN

## 2016-02-11 MED ORDER — NITROGLYCERIN 1 MG/10 ML FOR IR/CATH LAB
INTRA_ARTERIAL | Status: DC | PRN
  Administered 2016-02-11: 200 ug via INTRACORONARY

## 2016-02-11 MED ORDER — SODIUM CHLORIDE 0.9% FLUSH: 3.0000 mL | Freq: Two times a day (BID) | INTRAVENOUS | Status: DC

## 2016-02-11 MED ORDER — PANTOPRAZOLE SODIUM 40 MG PO TBEC
40.0000 mg | DELAYED_RELEASE_TABLET | Freq: Every day | ORAL | Status: DC
  Administered 2016-02-12 – 2016-02-13 (×2): 40 mg via ORAL
  Filled 2016-02-11 (×2): qty 1

## 2016-02-11 MED ORDER — SODIUM CHLORIDE 0.9% FLUSH: 3.0000 mL | INTRAVENOUS | Status: DC | PRN

## 2016-02-11 MED ORDER — HEPARIN (PORCINE) IN NACL 2-0.9 UNIT/ML-% IJ SOLN
INTRAMUSCULAR | Status: DC | PRN
  Administered 2016-02-11: 1000 mL

## 2016-02-11 MED ORDER — HEPARIN SODIUM (PORCINE) 1000 UNIT/ML IJ SOLN
INTRAMUSCULAR | Status: AC
  Filled 2016-02-11: qty 1

## 2016-02-11 MED ORDER — LOSARTAN POTASSIUM 50 MG PO TABS
100.0000 mg | ORAL_TABLET | Freq: Every day | ORAL | Status: DC
  Administered 2016-02-12 – 2016-02-13 (×2): 100 mg via ORAL
  Filled 2016-02-11 (×2): qty 2

## 2016-02-11 MED ORDER — FENTANYL CITRATE (PF) 100 MCG/2ML IJ SOLN
INTRAMUSCULAR | Status: DC | PRN
  Administered 2016-02-11 (×2): 50 ug via INTRAVENOUS

## 2016-02-11 MED ORDER — ONDANSETRON HCL 4 MG/2ML IJ SOLN: 4.0000 mg | Freq: Four times a day (QID) | INTRAMUSCULAR | Status: DC | PRN

## 2016-02-11 MED ORDER — MIDAZOLAM HCL 2 MG/2ML IJ SOLN
INTRAMUSCULAR | Status: DC | PRN
  Administered 2016-02-11: 0.5 mg via INTRAVENOUS
  Administered 2016-02-11: 1 mg via INTRAVENOUS

## 2016-02-11 MED ORDER — ESTROGENS CONJUGATED 0.9 MG PO TABS
0.9000 mg | ORAL_TABLET | Freq: Every day | ORAL | Status: DC
  Filled 2016-02-11 (×2): qty 1

## 2016-02-11 MED ORDER — LIDOCAINE HCL (PF) 1 % IJ SOLN
INTRAMUSCULAR | Status: DC | PRN
  Administered 2016-02-11: 18 mL

## 2016-02-11 MED ORDER — SODIUM CHLORIDE 0.9 % IV SOLN
INTRAVENOUS | Status: DC | PRN
  Administered 2016-02-11: 82 mL/h via INTRAVENOUS

## 2016-02-11 MED ORDER — ACETAMINOPHEN 325 MG PO TABS: 650.0000 mg | ORAL_TABLET | ORAL | Status: DC | PRN

## 2016-02-11 MED ORDER — ASPIRIN 81 MG PO CHEW
81.0000 mg | CHEWABLE_TABLET | Freq: Every day | ORAL | Status: DC
  Administered 2016-02-12 – 2016-02-13 (×2): 81 mg via ORAL
  Filled 2016-02-11 (×2): qty 1

## 2016-02-11 MED ORDER — MIDAZOLAM HCL 2 MG/2ML IJ SOLN
INTRAMUSCULAR | Status: AC
  Filled 2016-02-11: qty 2

## 2016-02-11 MED ORDER — SODIUM CHLORIDE 0.9% FLUSH
3.0000 mL | Freq: Two times a day (BID) | INTRAVENOUS | Status: DC
  Administered 2016-02-12 – 2016-02-13 (×3): 3 mL via INTRAVENOUS

## 2016-02-11 MED ORDER — SODIUM CHLORIDE 0.9 % WEIGHT BASED INFUSION
3.0000 mL/kg/h | INTRAVENOUS | Status: DC
  Administered 2016-02-11: 3 mL/kg/h via INTRAVENOUS

## 2016-02-11 SURGICAL SUPPLY — 20 items
BALLN EUPHORA RX 1.5X12 (BALLOONS) ×3
BALLN ~~LOC~~ EUPHORA RX 3.0X27 (BALLOONS) ×3
BALLN ~~LOC~~ MOZEC 2.5X13 (BALLOONS) ×2
BALLOON EUPHORA RX 1.5X12 (BALLOONS) IMPLANT
BALLOON ~~LOC~~ EUPHORA RX 3.0X27 (BALLOONS) IMPLANT
BALLOON ~~LOC~~ MOZEC 2.5X13 (BALLOONS) IMPLANT
DEVICE WIRE ANGIOSEAL 6FR (Vascular Products) ×2 IMPLANT
GUIDE CATH RUNWAY 6FR FR4 (CATHETERS) ×2 IMPLANT
KIT ENCORE 26 ADVANTAGE (KITS) ×2 IMPLANT
KIT HEART LEFT (KITS) ×3 IMPLANT
PACK CARDIAC CATHETERIZATION (CUSTOM PROCEDURE TRAY) ×3 IMPLANT
PINNACLE LONG 6F 25CM (SHEATH) ×3
SHEATH INTRO PINNACLE 6F 25CM (SHEATH) IMPLANT
SHEATH PINNACLE 6F 10CM (SHEATH) ×4 IMPLANT
STENT SYNERGY DES 2.5X38 (Permanent Stent) ×2 IMPLANT
STENT SYNERGY DES 2.75X38 (Permanent Stent) ×2 IMPLANT
TRANSDUCER W/STOPCOCK (MISCELLANEOUS) ×3 IMPLANT
TUBING CIL FLEX 10 FLL-RA (TUBING) ×3 IMPLANT
WIRE EMERALD 3MM-J .035X150CM (WIRE) ×2 IMPLANT
WIRE FIGHTER CROSSING 190CM (WIRE) ×2 IMPLANT

## 2016-02-11 NOTE — Interval H&P Note (Signed)
Cath Lab Visit (complete for each Cath Lab visit)  Clinical Evaluation Leading to the Procedure:   ACS: No.  Non-ACS:    Anginal Classification: CCS Murphy  Anti-ischemic medical therapy: Maximal Therapy (2 or more classes of medications)  Non-Invasive Test Results: Low-risk stress test findings: cardiac mortality <1%/year  Prior CABG: No previous CABG      History and Physical Interval Note:  02/11/2016 12:10 PM  Kathy Murphy  has presented today for surgery, with the diagnosis of cad  The various methods of treatment have been discussed with the patient and family. After consideration of risks, benefits and other options for treatment, the patient has consented to  Procedure(s): Coronary Stent Intervention (N/A) as a surgical intervention .  The patient's history has been reviewed, patient examined, no change in status, stable for surgery.  I have reviewed the patient's chart and labs.  Questions were answered to the patient's satisfaction.     Kathy Murphy

## 2016-02-11 NOTE — Progress Notes (Signed)
PHARMACIST - PHYSICIAN ORDER COMMUNICATION  CONCERNING: P&T Medication Policy on Herbal Medications  DESCRIPTION:  This patient's order for:  Airborne Lozenge (echinacea)  has been noted.  This product(s) is classified as an "herbal" or natural product. Due to a lack of definitive safety studies or FDA approval, nonstandard manufacturing practices, plus the potential risk of unknown drug-drug interactions while on inpatient medications, the Pharmacy and Therapeutics Committee does not permit the use of "herbal" or natural products of this type within [facility].   ACTION TAKEN: The pharmacy department is unable to verify this order at this time and your patient has been informed of this safety policy. Please reevaluate patient's clinical condition at discharge and address if the herbal or natural product(s) should be resumed at that time.    Gracy Bruins, PharmD Clinical Pharmacist [facility]

## 2016-02-11 NOTE — Progress Notes (Signed)
Received to 6c08, denies complaints.  Rt groin angioseal Level 0. Oriented to room, call light in reach.

## 2016-02-11 NOTE — CV Procedure (Signed)
   Right femoral approach PCI of chronic total occlusion of the right coronary with micro-channel and faint antegrade flow. Right coronary is collateralized vigorously from left to right by left coronary arteries.  A 0.014/0.008 Fighter interventional wire was used to cross the microchannel stenosis in the right coronary. A 1.5 cm 12 mm long interventional wire was then used to predilate the region of subtotal occlusion. A 2.5 x 12 mm long in C balloon was then use to further dilate the mid right coronary and the distal right coronary whether was also a high-grade obstruction.  The procedure was completed using overlapping 38 mm 2.5 and 2.75 mm Synergy drug-eluting stents postdilated to 3.0 mm in diameter.  TIMI grade 3 flow established. 0% stenosis. Very nice result.  Angio-Seal was used for hemostasis.  No complications.

## 2016-02-11 NOTE — H&P (View-Only) (Signed)
Thanks! Kathy Murphy 

## 2016-02-12 ENCOUNTER — Other Ambulatory Visit: Payer: Self-pay

## 2016-02-12 DIAGNOSIS — I200 Unstable angina: Secondary | ICD-10-CM | POA: Diagnosis not present

## 2016-02-12 DIAGNOSIS — I10 Essential (primary) hypertension: Secondary | ICD-10-CM

## 2016-02-12 DIAGNOSIS — Z79890 Hormone replacement therapy (postmenopausal): Secondary | ICD-10-CM | POA: Diagnosis not present

## 2016-02-12 DIAGNOSIS — R931 Abnormal findings on diagnostic imaging of heart and coronary circulation: Secondary | ICD-10-CM | POA: Diagnosis not present

## 2016-02-12 DIAGNOSIS — Z7982 Long term (current) use of aspirin: Secondary | ICD-10-CM | POA: Diagnosis not present

## 2016-02-12 DIAGNOSIS — R000 Tachycardia, unspecified: Secondary | ICD-10-CM | POA: Diagnosis not present

## 2016-02-12 DIAGNOSIS — Z955 Presence of coronary angioplasty implant and graft: Secondary | ICD-10-CM | POA: Diagnosis not present

## 2016-02-12 DIAGNOSIS — I493 Ventricular premature depolarization: Secondary | ICD-10-CM | POA: Diagnosis not present

## 2016-02-12 DIAGNOSIS — E890 Postprocedural hypothyroidism: Secondary | ICD-10-CM | POA: Diagnosis not present

## 2016-02-12 DIAGNOSIS — I25110 Atherosclerotic heart disease of native coronary artery with unstable angina pectoris: Secondary | ICD-10-CM | POA: Diagnosis not present

## 2016-02-12 DIAGNOSIS — I2582 Chronic total occlusion of coronary artery: Secondary | ICD-10-CM | POA: Diagnosis not present

## 2016-02-12 DIAGNOSIS — Z79899 Other long term (current) drug therapy: Secondary | ICD-10-CM | POA: Diagnosis not present

## 2016-02-12 DIAGNOSIS — K219 Gastro-esophageal reflux disease without esophagitis: Secondary | ICD-10-CM | POA: Diagnosis not present

## 2016-02-12 LAB — BASIC METABOLIC PANEL
Anion gap: 10 (ref 5–15)
BUN: 17 mg/dL (ref 6–20)
CHLORIDE: 103 mmol/L (ref 101–111)
CO2: 26 mmol/L (ref 22–32)
Calcium: 8.5 mg/dL — ABNORMAL LOW (ref 8.9–10.3)
Creatinine, Ser: 0.72 mg/dL (ref 0.44–1.00)
GFR calc Af Amer: >60 mL/min (ref >60)
GFR calc non Af Amer: >60 mL/min (ref >60)
Glucose, Bld: 102 mg/dL — ABNORMAL HIGH (ref 65–99)
POTASSIUM: 3.7 mmol/L (ref 3.5–5.1)
SODIUM: 139 mmol/L (ref 135–145)

## 2016-02-12 LAB — CBC
HEMATOCRIT: 38.2 % (ref 36.0–46.0)
Hemoglobin: 12.2 g/dL (ref 12.0–15.0)
MCH: 29.8 pg (ref 26.0–34.0)
MCHC: 31.9 g/dL (ref 30.0–36.0)
MCV: 93.4 fL (ref 78.0–100.0)
Platelets: 172 10*3/uL (ref 150–400)
RBC: 4.09 MIL/uL (ref 3.87–5.11)
RDW: 13.3 % (ref 11.5–15.5)
WBC: 7.1 10*3/uL (ref 4.0–10.5)

## 2016-02-12 MED ORDER — METOPROLOL SUCCINATE ER 100 MG PO TB24
100.0000 mg | ORAL_TABLET | Freq: Every day | ORAL | Status: DC
  Administered 2016-02-13: 100 mg via ORAL
  Filled 2016-02-12: qty 1

## 2016-02-12 MED ORDER — METOPROLOL SUCCINATE ER 50 MG PO TB24
50.0000 mg | ORAL_TABLET | Freq: Once | ORAL | Status: AC
  Administered 2016-02-12: 11:00:00 50 mg via ORAL
  Filled 2016-02-12: qty 1

## 2016-02-12 NOTE — Progress Notes (Addendum)
Patient Name: Kathy Murphy Date of Encounter: 02/12/2016  Primary Cardiologist: Dr. [facility] Problem List     Active Problems:   GERD (gastroesophageal reflux disease)   Chest pain   Abnormal nuclear cardiac imaging test   Chronic total occlusion of coronary artery   Coronary artery disease   Unstable angina pectoris (HCC)   CAD (coronary artery disease), native coronary artery   Patient Profile     82 y/o female with known CAD, admitted for re-attempt PCI of the RCA (CTO).   Subjective   Doing well. No chest pain or dyspnea. Groin is stable.   Inpatient Medications    Scheduled Meds: . aspirin  81 mg Oral Daily  . clopidogrel  75 mg Oral Q breakfast  . enoxaparin (LOVENOX) injection  40 mg Subcutaneous Q24H  . estrogens (conjugated)  0.9 mg Oral Daily  . levothyroxine  137 mcg Oral QAC breakfast  . losartan  100 mg Oral Daily  . metoprolol succinate  50 mg Oral Daily  . pantoprazole  40 mg Oral Daily  . sodium chloride flush  3 mL Intravenous Q12H   Continuous Infusions:  PRN Meds: sodium chloride, acetaminophen, HYDROcodone-acetaminophen, ondansetron (ZOFRAN) IV, sodium chloride flush   Vital Signs    Vitals:   02/11/16 2200 02/12/16 0219 02/12/16 0720 02/12/16 0721  BP:  (!) 177/58 (!) 201/73 (!) 171/83  Pulse:  85 84   Resp: 19 17 (!) 25   Temp:  97.1 F (36.2 C) 98.2 F (36.8 C)   TempSrc:  Axillary Oral   SpO2:  97% 100%   Weight:  188 lb 11.4 oz (85.6 kg)    Height:        Intake/Output Summary (Last 24 hours) at 02/12/16 0820 Last data filed at 02/12/16 0805  Gross per 24 hour  Intake              360 ml  Output             1100 ml  Net             -740 ml   Filed Weights   02/11/16 0833 02/12/16 0219  Weight: 180 lb (81.6 kg) 188 lb 11.4 oz (85.6 kg)    Physical Exam   GEN: Well nourished, well developed, in no acute distress.  HEENT: Grossly normal.  Neck: Supple, no JVD, carotid bruits, or masses. Cardiac: RRR, no  murmurs, rubs, or gallops. No clubbing, cyanosis, edema.  Radials/DP/PT 2+ and equal bilaterally.  Respiratory:  Respirations regular and unlabored, clear to auscultation bilaterally. GI: Soft, nontender, nondistended, BS + x 4. MS: no deformity or atrophy. Skin: warm and dry, no rash. Neuro:  Strength and sensation are intact. Psych: AAOx3.  Normal affect.  Labs    CBC  Recent Labs  02/11/16 1014 02/12/16 0424  WBC 6.3 7.1  HGB 13.2 12.2  HCT 41.1 38.2  MCV 93.8 93.4  PLT 177 Q000111Q   Basic Metabolic Panel  Recent Labs  02/11/16 1014 02/12/16 0424  NA 138 139  K 3.7 3.7  CL 104 103  CO2 24 26  GLUCOSE 97 102*  BUN 14 17  CREATININE 0.71 0.72  CALCIUM 8.9 8.5*   Liver Function Tests No results for input(s): AST, ALT, ALKPHOS, BILITOT, PROT, ALBUMIN in the last 72 hours. No results for input(s): LIPASE, AMYLASE in the last 72 hours. Cardiac Enzymes No results for input(s): CKTOTAL, CKMB, CKMBINDEX, TROPONINI in the last 72  hours. BNP Invalid input(s): POCBNP D-Dimer No results for input(s): DDIMER in the last 72 hours. Hemoglobin A1C No results for input(s): HGBA1C in the last 72 hours. Fasting Lipid Panel No results for input(s): CHOL, HDL, LDLCALC, TRIG, CHOLHDL, LDLDIRECT in the last 72 hours. Thyroid Function Tests No results for input(s): TSH, T4TOTAL, T3FREE, THYROIDAB in the last 72 hours.  Invalid input(s): FREET3  Telemetry    NSR- Personally Reviewed    Radiology    No results found.  Cardiac Studies    Procedures   Coronary Stent Intervention  Conclusion    Mid RCA chronic total occlusion collateralized from the left coronary artery.  Successful overlapping stents from the distal RCA no the margin of the PDA back to the proximal RCA using two 38 mm long Synergy drug-eluting stents postdilated to 3.0 mm in diameter with 100% stenosis reduced to 0%. Grade 3 flow was noted.  Angio-Seal was used for successful hemostasis.    Patient  Profile     82 y/o female with known CAD, admitted for re-attempt PCI of the RCA (CTO).   Assessment & Plan    1. CAD: s/p CTO PCI of RCA. Continue ASA + Plavix, losartan and metoprolol. She is stable w/o CP. No dyspnea. Groin is stable. Ambulate with cardiac rehab prior to discharge. Anticipate d/c home today. She does not appear to be on a statin. Given her CAD recommend addition of statin. Can check lipid panel in office.   Signed, Lyda Jester, PA-C  02/12/2016, 8:20 AM

## 2016-02-12 NOTE — Progress Notes (Signed)
CARDIAC REHAB PHASE I   PRE:  Rate/Rhythm: 92 SR, standing 130 first deg    BP: sitting 113/66 resting    SaO2:   MODE:  Ambulation: 300 ft   POST:  Rate/Rhythm: 121 first degree, 103 with PVCs    BP: sitting 151/74     SaO2:   Pt moving around room, off monitor. When I put her on, NSR. But HR increased significantly to 130 with standing. Continued to be elevated with increased PVCs with walking. She endorses "tightness" in upper chest on return to room. Ed completed with pt, voiced understanding. Understands importance of Plavix. Will send referral to [place]netta, ACSM 02/12/2016 9:37 AM

## 2016-02-13 ENCOUNTER — Encounter (HOSPITAL_COMMUNITY): Payer: Self-pay | Admitting: *Deleted

## 2016-02-13 DIAGNOSIS — I10 Essential (primary) hypertension: Secondary | ICD-10-CM | POA: Diagnosis not present

## 2016-02-13 DIAGNOSIS — I200 Unstable angina: Secondary | ICD-10-CM | POA: Diagnosis not present

## 2016-02-13 DIAGNOSIS — R931 Abnormal findings on diagnostic imaging of heart and coronary circulation: Secondary | ICD-10-CM | POA: Diagnosis not present

## 2016-02-13 DIAGNOSIS — K219 Gastro-esophageal reflux disease without esophagitis: Secondary | ICD-10-CM | POA: Diagnosis not present

## 2016-02-13 DIAGNOSIS — I2582 Chronic total occlusion of coronary artery: Secondary | ICD-10-CM | POA: Diagnosis not present

## 2016-02-13 DIAGNOSIS — I493 Ventricular premature depolarization: Secondary | ICD-10-CM | POA: Diagnosis not present

## 2016-02-13 DIAGNOSIS — I25110 Atherosclerotic heart disease of native coronary artery with unstable angina pectoris: Secondary | ICD-10-CM | POA: Diagnosis not present

## 2016-02-13 DIAGNOSIS — E890 Postprocedural hypothyroidism: Secondary | ICD-10-CM | POA: Diagnosis not present

## 2016-02-13 MED ORDER — METOPROLOL SUCCINATE ER 100 MG PO TB24: 100.0000 mg | ORAL_TABLET | Freq: Every day | ORAL | 5 refills | Status: DC

## 2016-02-13 NOTE — Progress Notes (Signed)
Patient Name: Kathy Murphy Date of Encounter: 02/13/2016  Primary Cardiologist: Dr. [facility] Problem List     Active Problems:   GERD (gastroesophageal reflux disease)   Chest pain   Abnormal nuclear cardiac imaging test   Chronic total occlusion of coronary artery   Coronary artery disease   Unstable angina pectoris (HCC)   CAD (coronary artery disease), native coronary artery   Patient Profile     82 y/o female with known CAD, admitted for re-attempt PCI of the RCA (CTO).   Subjective   Doing well. No chest pain or dyspnea. Groin is stable. Denies palpitations.   Inpatient Medications    Scheduled Meds: . aspirin  81 mg Oral Daily  . clopidogrel  75 mg Oral Q breakfast  . enoxaparin (LOVENOX) injection  40 mg Subcutaneous Q24H  . estrogens (conjugated)  0.9 mg Oral Daily  . levothyroxine  137 mcg Oral QAC breakfast  . losartan  100 mg Oral Daily  . metoprolol succinate  100 mg Oral Daily  . pantoprazole  40 mg Oral Daily  . sodium chloride flush  3 mL Intravenous Q12H   Continuous Infusions:  PRN Meds: sodium chloride, acetaminophen, HYDROcodone-acetaminophen, ondansetron (ZOFRAN) IV, sodium chloride flush   Vital Signs    Vitals:   02/12/16 0721 02/12/16 1253 02/12/16 2043 02/13/16 0344  BP: (!) 171/83 (!) 164/77 133/67 (!) 158/73  Pulse:  77 89 84  Resp:  18 18 20   Temp:  97.7 F (36.5 C) 97.9 F (36.6 C) 98.5 F (36.9 C)  TempSrc:  Oral Oral Oral  SpO2:  98% 97% 96%  Weight:      Height:        Intake/Output Summary (Last 24 hours) at 02/13/16 0718 Last data filed at 02/13/16 0655  Gross per 24 hour  Intake             1080 ml  Output             1325 ml  Net             -245 ml   Filed Weights   02/11/16 0833 02/12/16 0219  Weight: 180 lb (81.6 kg) 188 lb 11.4 oz (85.6 kg)    Physical Exam   GEN: Well nourished, well developed, in no acute distress.  HEENT: Grossly normal.  Neck: Supple, no JVD, carotid bruits, or  masses. Cardiac: RRR, no murmurs, rubs, or gallops. No clubbing, cyanosis, edema.  Radials/DP/PT 2+ and equal bilaterally.  Respiratory:  Respirations regular and unlabored, clear to auscultation bilaterally. GI: Soft, nontender, nondistended, BS + x 4. MS: no deformity or atrophy. Skin: warm and dry, no rash. Neuro:  Strength and sensation are intact. Psych: AAOx3.  Normal affect.  Labs    CBC  Recent Labs  02/11/16 1014 02/12/16 0424  WBC 6.3 7.1  HGB 13.2 12.2  HCT 41.1 38.2  MCV 93.8 93.4  PLT 177 Q000111Q   Basic Metabolic Panel  Recent Labs  02/11/16 1014 02/12/16 0424  NA 138 139  K 3.7 3.7  CL 104 103  CO2 24 26  GLUCOSE 97 102*  BUN 14 17  CREATININE 0.71 0.72  CALCIUM 8.9 8.5*   Liver Function Tests No results for input(s): AST, ALT, ALKPHOS, BILITOT, PROT, ALBUMIN in the last 72 hours. No results for input(s): LIPASE, AMYLASE in the last 72 hours. Cardiac Enzymes No results for input(s): CKTOTAL, CKMB, CKMBINDEX, TROPONINI in the last 72 hours. BNP  Invalid input(s): POCBNP D-Dimer No results for input(s): DDIMER in the last 72 hours. Hemoglobin A1C No results for input(s): HGBA1C in the last 72 hours. Fasting Lipid Panel No results for input(s): CHOL, HDL, LDLCALC, TRIG, CHOLHDL, LDLDIRECT in the last 72 hours. Thyroid Function Tests No results for input(s): TSH, T4TOTAL, T3FREE, THYROIDAB in the last 72 hours.  Invalid input(s): FREET3  Telemetry    NSR- Personally Reviewed    Radiology    No results found.  Cardiac Studies    Procedures   Coronary Stent Intervention  Conclusion    Mid RCA chronic total occlusion collateralized from the left coronary artery.  Successful overlapping stents from the distal RCA no the margin of the PDA back to the proximal RCA using two 38 mm long Synergy drug-eluting stents postdilated to 3.0 mm in diameter with 100% stenosis reduced to 0%. Grade 3 flow was noted.  Angio-Seal was used for successful  hemostasis.    Patient Profile     82 y/o female with known CAD, admitted for re-attempt PCI of the RCA (CTO).   Assessment & Plan    1. CAD: s/p CTO PCI of RCA 02/11/16. Continue ASA + Plavix, losartan and metoprolol. She is stable w/o CP. No dyspnea. Groin is stable. Ambulate with cardiac rehab prior to discharge. Anticipate d/c home today if vital signs remain stable. She does not appear to be on a statin. Given her CAD recommend addition of statin. Can check lipid panel in office.   2. HTN: BP was poorly controlled yesterday in the [id number] systolic range with elevated HR + PVCs. Metoprolol XL was increased to 100 mg daily yesterday. Last BP measurement was 158/73. She is due for another dose of metoprolol this am. Continue to monitor and if stable plan for d/c later today.   3. PVCs: NSR on telemetry with rate in the 70s currently. Ambulate with cardiac rehab again today. Monitor rate and rhythm during ambulation. Continue metoprolol XL, 100 mg daily. Pt is on Synthroid for hypothyroidism after thyroidectomy, however recent TSH was WNL. EF 50% by cath.     Signed, Lyda Jester, PA-C  02/13/2016, 7:18 AM

## 2016-02-13 NOTE — Discharge Summary (Signed)
Discharge Summary    Patient ID: Kathy Murphy,  MRN: [id number], DOB/AGE: Nov 13, 1927 82 y.o.  Admit date: 02/11/2016 Discharge date: 02/13/2016  Primary Care Provider: ARONSON,RICHARD A Primary Cardiologist: Dr. Sallyanne Kuster   Discharge Diagnoses    Active Problems:   GERD (gastroesophageal reflux disease)   Chest pain   Abnormal nuclear cardiac imaging test   Chronic total occlusion of coronary artery   Coronary artery disease   Unstable angina pectoris (HCC)   CAD (coronary artery disease), native coronary artery   Allergies Allergies  Allergen Reactions  . Other     SOME OF THE "MYCINS"...UNKNOWN REACTION.  Marland Kitchen Latex Rash    Diagnostic Studies/Procedures   Procedures   Coronary Stent Intervention  Conclusion    Mid RCA chronic total occlusion collateralized from the left coronary artery.  Successful overlapping stents from the distal RCA no the margin of the PDA back to the proximal RCA using two 38 mm long Synergy drug-eluting stents postdilated to 3.0 mm in diameter with 100% stenosis reduced to 0%. Grade 3 flow was noted.  Angio-Seal was used for successful hemostasis.      History of Present Illness     82 y/o female with h/o CAD, hypertension, and hypothyroidism secondary to prior thyroidectomy now on hormone replacement, who was readmitted for re-attempt PCI of the RCA (CTO).   Pt had initial LHC on 02/08/16. She was noted to have significant coronary artery disease with subtotally occluded mid right coronary collateralized from the left coronary artery (LAD and circumflex).  There was severe ostial obstruction in the second diagonal, 75% stenosis in the first diagonal, 70% stenosis within a region of tortuosity in the ramus intermedius, and 50% narrowing in the proximal to mid LAD. The distal LAD is diffusely diseased without focal obstruction. EF was noted to be low normal at 50%. An attempt was made for PCI of the mid RCA at that time on 11/28, but was  unsuccessful due to poor guide catheter support. She was placed on DAPT + anti-ischemic therapy with plans for re-attempt at a later date.   Hospital Course     Patient presented back to [facility] on 02/11/16 for re-attempt PCI of the RCA (CTO). Procedure was performed by Dr. Tamala Julian. Re-attempt was successful. Patient underwent successful overlapping stents from the distal RCA to the margin of the PDA back to the proximal RCA using two 38 mm long Synergy drug-eluting stents postdilated to 3.0 mm in diameter with 100% stenosis reduced to 0%. Grade 3 flow was noted. She tolerated the procedure well and left the cath lab in stable condition. She was continued on ASA and Plavix. She had no post cath complications, however she was keep for observation x 2 nights. Her hospitalization was extended due to severe HTN, tachycardia and PVCs. She required upward titration of her beta blocker. Metoprolol XL was increased to 100 mg daily. Her VS stabilized. She had no further issues. No recurrent CP. She as ambulating without exertional symptoms. She was last seen and examined by Dr. Debara Pickett, who determined she was stable for discharge home. She will f/u in clinic with Dr. Sallyanne Kuster or an APP in 1-2 weeks for post hospital f/u.    Consultants: none    Discharge Vitals Blood pressure (!) 158/73, pulse 84, temperature 98.5 F (36.9 C), temperature source Oral, resp. rate 20, height 5\' 8"  (1.727 m), weight 188 lb 11.4 oz (85.6 kg), SpO2 96 %.  Filed Weights   02/11/16 [phone]  02/12/16 0219  Weight: 180 lb (81.6 kg) 188 lb 11.4 oz (85.6 kg)    Labs & Radiologic Studies    CBC  Recent Labs  02/11/16 1014 02/12/16 0424  WBC 6.3 7.1  HGB 13.2 12.2  HCT 41.1 38.2  MCV 93.8 93.4  PLT 177 Q000111Q   Basic Metabolic Panel  Recent Labs  02/11/16 1014 02/12/16 0424  NA 138 139  K 3.7 3.7  CL 104 103  CO2 24 26  GLUCOSE 97 102*  BUN 14 17  CREATININE 0.71 0.72  CALCIUM 8.9 8.5*   Liver Function Tests No results for  input(s): AST, ALT, ALKPHOS, BILITOT, PROT, ALBUMIN in the last 72 hours. No results for input(s): LIPASE, AMYLASE in the last 72 hours. Cardiac Enzymes No results for input(s): CKTOTAL, CKMB, CKMBINDEX, TROPONINI in the last 72 hours. BNP Invalid input(s): POCBNP D-Dimer No results for input(s): DDIMER in the last 72 hours. Hemoglobin A1C No results for input(s): HGBA1C in the last 72 hours. Fasting Lipid Panel No results for input(s): CHOL, HDL, LDLCALC, TRIG, CHOLHDL, LDLDIRECT in the last 72 hours. Thyroid Function Tests No results for input(s): TSH, T4TOTAL, T3FREE, THYROIDAB in the last 72 hours.  Invalid input(s): FREET3 _____________  No results found. Disposition   Pt is being discharged home today in good condition.  Follow-up Plans & Appointments    Follow-up Information    Sanda Klein, MD Follow up.   Specialty:  Cardiology Why:  our office will call you with a hospital follow-up appointment  Contact information: [address] [place] [address] [phone]          Discharge Instructions    Amb Referral to Cardiac Rehabilitation    Complete by:  As directed    Diagnosis:   PTCA Coronary Stents        Discharge Medications   Current Discharge Medication List    CONTINUE these medications which have CHANGED   Details  metoprolol succinate (TOPROL-XL) 100 MG 24 hr tablet Take 1 tablet (100 mg total) by mouth daily. Take with or immediately following a meal. Qty: 30 tablet, Refills: 5      CONTINUE these medications which have NOT CHANGED   Details  aspirin 81 MG tablet Take 81 mg by mouth daily.      chlorpheniramine-HYDROcodone (TUSSIONEX PENNKINETIC ER) 10-8 MG/5ML SUER Take 5 mLs by mouth every 12 (twelve) hours as needed for cough.    clopidogrel (PLAVIX) 75 MG tablet Take 1 tablet (75 mg total) by mouth daily with breakfast. Qty: 30 tablet, Refills: 11    estrogens, conjugated, (PREMARIN) 0.9 MG tablet Take 0.9 mg  by mouth daily. Take daily for 21 days then do not take for 7 days.     levothyroxine (SYNTHROID, LEVOTHROID) 137 MCG tablet Take 137 mcg by mouth daily.      losartan (COZAAR) 100 MG tablet Take 100 mg by mouth daily.     pantoprazole (PROTONIX) 40 MG tablet Take 40 mg by mouth daily.      HYDROcodone-acetaminophen (NORCO/VICODIN) 5-325 MG tablet Take 1 tablet by mouth every 6 (six) hours as needed for moderate pain.    Multiple Vitamins-Minerals (AIRBORNE) LOZG Take 1 tablet by mouth daily as needed (common cold symptoms).    naproxen sodium (ANAPROX) 220 MG tablet Take 220 mg by mouth daily as needed (for dull headaches.).         Aspirin prescribed at discharge?  Yes High Intensity Statin Prescribed? (Lipitor 40-80mg  or  Crestor 20-40mg ): Yes Beta Blocker Prescribed? Yes For EF <40%, was ACEI/ARB Prescribed? Yes ADP Receptor Inhibitor Prescribed? (i.e. Plavix etc.-Includes Medically Managed Patients): Yes For EF <40%, Aldosterone Inhibitor Prescribed? Yes Was EF assessed during THIS hospitalization? No:  Was Cardiac Rehab II ordered? (Included Medically managed Patients): Yes   Outstanding Labs/Studies   None   Duration of Discharge Encounter   Greater than 30 minutes including physician time.  Signed, Lyda Jester PA-C 02/13/2016, 12:14 PM

## 2016-02-13 NOTE — Progress Notes (Signed)
Discharged to home with family office visits in place teaching done  

## 2016-02-14 ENCOUNTER — Encounter (HOSPITAL_COMMUNITY): Payer: Self-pay | Admitting: Interventional Cardiology

## 2016-02-14 NOTE — Progress Notes (Signed)
Thank you, Hank!

## 2016-02-16 DIAGNOSIS — M25511 Pain in right shoulder: Secondary | ICD-10-CM | POA: Diagnosis not present

## 2016-02-16 DIAGNOSIS — I10 Essential (primary) hypertension: Secondary | ICD-10-CM | POA: Diagnosis not present

## 2016-02-16 DIAGNOSIS — Z6828 Body mass index (BMI) 28.0-28.9, adult: Secondary | ICD-10-CM | POA: Diagnosis not present

## 2016-02-16 DIAGNOSIS — I2510 Atherosclerotic heart disease of native coronary artery without angina pectoris: Secondary | ICD-10-CM | POA: Diagnosis not present

## 2016-02-17 ENCOUNTER — Encounter (HOSPITAL_COMMUNITY): Payer: Self-pay | Admitting: Interventional Cardiology

## 2016-02-17 ENCOUNTER — Telehealth: Payer: Self-pay | Admitting: Cardiology

## 2016-02-17 NOTE — Telephone Encounter (Signed)
new message   Pt verbalized that at the hospital the pt medications was changed and she is calling to see what pain medication that she can talk for the replacement of diciofenac

## 2016-02-17 NOTE — Telephone Encounter (Signed)
Returned call to patient.She stated she was in hospital recently,was told to stop using Diclofenac.Stated she has to take for arthritis pain.Advised I will move up post hospital appointment to 02/21/16 with Almyra Deforest PA at 11:30 am to discuss.

## 2016-02-21 ENCOUNTER — Encounter: Payer: Self-pay | Admitting: Physician Assistant

## 2016-02-21 ENCOUNTER — Ambulatory Visit (INDEPENDENT_AMBULATORY_CARE_PROVIDER_SITE_OTHER): Payer: Medicare Other | Admitting: Physician Assistant

## 2016-02-21 VITALS — BP 156/82 | HR 89 | Ht 68.0 in | Wt 185.8 lb

## 2016-02-21 DIAGNOSIS — I2510 Atherosclerotic heart disease of native coronary artery without angina pectoris: Secondary | ICD-10-CM | POA: Diagnosis not present

## 2016-02-21 DIAGNOSIS — I119 Hypertensive heart disease without heart failure: Secondary | ICD-10-CM

## 2016-02-21 DIAGNOSIS — E785 Hyperlipidemia, unspecified: Secondary | ICD-10-CM | POA: Diagnosis not present

## 2016-02-21 DIAGNOSIS — E039 Hypothyroidism, unspecified: Secondary | ICD-10-CM | POA: Diagnosis not present

## 2016-02-21 MED ORDER — CLOPIDOGREL BISULFATE 75 MG PO TABS: 75.0000 mg | ORAL_TABLET | Freq: Every day | ORAL | 3 refills | Status: DC

## 2016-02-21 MED ORDER — CLOPIDOGREL BISULFATE 75 MG PO TABS: 75.0000 mg | ORAL_TABLET | Freq: Every day | ORAL | 0 refills | Status: DC

## 2016-02-21 NOTE — Progress Notes (Addendum)
Cardiology Office Note    Date:  02/22/2016   ID:  Kathy Murphy, DOB 11-02-1927, MRN [id number]  PCP:  Geoffery Lyons, MD  Cardiologist:  Dr. Sallyanne Kuster   Chief Complaint  Patient presents with  . Hospitalization Follow-up    seen for Dr. Sallyanne Kuster, post PCI    History of Present Illness:  Kathy Murphy is a 82 y.o. female with PMH of HTN, HLD, hypothyroidism and recently diagnosed CAD who presents today for post hospital cardiology visit. She had at abnormal echocardiogram and Myoview in October 2017. Myoview obtained on 12/30/2015 showed EF 43%, low risk study with no ischemia or evidence of infarction. Echocardiogram obtained on 01/14/2016 showed EF 40-45%, moderate diffuse hypokinesis, grade 1 diastolic dysfunction. There was some concern of balanced ischemia, cardiac catheterization was recommended. She underwent outpatient cardiac catheterization on 02/08/2016 which showed a 90% second diagonal, subtotally occluded RCA, 60% proximal to mid LAD with diffuse distal LAD disease. An attempt was made for PCI of RCA on 11/28, but was unsuccessful due to poor guide catheter support. She eventually underwent staged PCI on 02/11/2016 with CTO PCI of RCA via right femoral approach. 2 Synergy overlapping stents were placed. Post procedure he had no obvious complications. Postprocedure, she was continued on aspirin and Plavix. Her hospitalization was extended due to severe hypertension, tachycardia and PVCs. She also required upward titration of her beta blocker, Toprol-XL was increased to 100 mg daily.  She presents today for transition of care follow-up. She is doing quite well since discharge, she did have a fleeting episode of chest pain that lasted less than a minute yesterday. She continued to have bilateral shoulder pain and knee pain, it is improved with 650 mg of Tylenol, however tramadol has not been working very well for her. She is still on Protonix for GI protection. I have asked her to  check with her orthopedic doctor to see what other alternatives there are besides NSAID. Otherwise she is doing quite well from cardiology perspective.   Past Medical History:  Diagnosis Date  . Arthritis   . Cancer ([place])    thyroid  . GERD (gastroesophageal reflux disease)   . GI bleed 1980   AFTER POLYP EXCISION  . Heart disease, hypertensive   . Hypercholesterolemia   . Hypertension   . Hypothyroidism   . Myalgia   . Obesity   . Pain in joints   . Retinal detachment   . Unsteady gait   . UTI (urinary tract infection)   . Vomiting     Past Surgical History:  Procedure Laterality Date  . ABDOMINAL HYSTERECTOMY  1977  . BREAST BIOPSY    . BREAST LUMPECTOMY     x5  . CARDIAC CATHETERIZATION N/A 02/08/2016   Procedure: Left Heart Cath and Coronary Angiography;  Surgeon: Belva Crome, MD;  Location: [facility];  Service: Cardiovascular;  Laterality: N/A;  . CARDIAC CATHETERIZATION N/A 02/11/2016   Procedure: Coronary Stent Intervention;  Surgeon: Belva Crome, MD;  Location: [facility];  Service: Cardiovascular;  Laterality: N/A;  . CERVICAL LAMINECTOMY    . [place]  . FOOT SURGERY    . NECK SURGERY    . THYROIDECTOMY  1957  . TOTAL HIP ARTHROPLASTY  2012   left    Current Medications: Outpatient Medications Prior to Visit  Medication Sig Dispense Refill  . aspirin 81 MG tablet Take 81 mg by mouth daily.      Marland Kitchen  HYDROcodone-acetaminophen (NORCO/VICODIN) 5-325 MG tablet Take 1 tablet by mouth every 6 (six) hours as needed for moderate pain.    Marland Kitchen levothyroxine (SYNTHROID, LEVOTHROID) 137 MCG tablet Take 137 mcg by mouth daily.      Marland Kitchen losartan (COZAAR) 100 MG tablet Take 100 mg by mouth daily.     . metoprolol succinate (TOPROL-XL) 100 MG 24 hr tablet Take 1 tablet (100 mg total) by mouth daily. Take with or immediately following a meal. 30 tablet 5  . pantoprazole (PROTONIX) 40 MG tablet Take 40 mg by mouth daily.      . clopidogrel (PLAVIX)  75 MG tablet Take 1 tablet (75 mg total) by mouth daily with breakfast. 30 tablet 11  . Multiple Vitamins-Minerals (AIRBORNE) LOZG Take 1 tablet by mouth daily as needed (common cold symptoms).    . naproxen sodium (ANAPROX) 220 MG tablet Take 220 mg by mouth daily as needed (for dull headaches.).    Marland Kitchen chlorpheniramine-HYDROcodone (TUSSIONEX PENNKINETIC ER) 10-8 MG/5ML SUER Take 5 mLs by mouth every 12 (twelve) hours as needed for cough.    . estrogens, conjugated, (PREMARIN) 0.9 MG tablet Take 0.9 mg by mouth daily. Take daily for 21 days then do not take for 7 days.      No facility-administered medications prior to visit.      Allergies:   Other and Latex   Social History   Social History  . Marital status: Widowed    Spouse name: N/A  . Number of children: N/A  . Years of education: N/A   Social History Main Topics  . Smoking status: Never Smoker  . Smokeless tobacco: Never Used  . Alcohol use No  . Drug use: No  . Sexual activity: Not Asked   Other Topics Concern  . None   Social History Narrative  . None     Family History:  The patient's family history includes Heart disease in her mother.   ROS:   Please see the history of present illness.    ROS All other systems reviewed and are negative.   PHYSICAL EXAM:   VS:  BP (!) 156/82   Pulse 89   Ht 5\' 8"  (1.727 m)   Wt 185 lb 12.8 oz (84.3 kg)   BMI 28.25 kg/m    GEN: Well nourished, well developed, in no acute distress  HEENT: normal  Neck: no JVD, carotid bruits, or masses Cardiac: RRR; no murmurs, rubs, or gallops,no edema  Respiratory:  clear to auscultation bilaterally, normal work of breathing GI: soft, nontender, nondistended, + BS MS: no deformity or atrophy  Skin: warm and dry, no rash Neuro:  Alert and Oriented x 3, Strength and sensation are intact Psych: euthymic mood, full affect  Wt Readings from Last 3 Encounters:  02/21/16 185 lb 12.8 oz (84.3 kg)  02/12/16 188 lb 11.4 oz (85.6 kg)    02/08/16 180 lb (81.6 kg)      Studies/Labs Reviewed:   EKG:  EKG is ordered today. Normal sinus rhythm without significant changes.  Recent Labs: 01/31/2016: TSH 0.89 02/12/2016: BUN 17; Creatinine, Ser 0.72; Hemoglobin 12.2; Platelets 172; Potassium 3.7; Sodium 139   Lipid Panel No results found for: CHOL, TRIG, HDL, CHOLHDL, VLDL, LDLCALC, LDLDIRECT  Additional studies/ records that were reviewed today include:   Cath 02/08/2016 Conclusion     Prox LAD to Mid LAD lesion, 50 %stenosed.    Significant coronary artery disease with subtotally occluded mid right coronary collateralized from the left coronary  artery (LAD and circumflex).  Severe ostial obstruction in the second diagonal, 75% stenosis in the first diagonal, 70% stenosis within a region of tortuosity in the ramus intermedius, and 50% narrowing in the proximal to mid LAD. The distal LAD is diffusely diseased without focal obstruction.  Low normal LV function with EF estimated to be 50%.  Failed PTCA of the mid right coronary due to poor guide catheter support.  RECOMMENDATION:  Begin dual antiplatelet therapy.  Re-attempt PCI of the right coronary from the femoral approach on 02/11/2016. Will discuss with CTO team as well.  Intensify anti-ischemic therapy.    Cath 02/11/2016 Conclusion    Mid RCA chronic total occlusion collateralized from the left coronary artery.  Successful overlapping stents from the distal RCA no the margin of the PDA back to the proximal RCA using two 38 mm long Synergy drug-eluting stents postdilated to 3.0 mm in diameter with 100% stenosis reduced to 0%. Grade 3 flow was noted.  Angio-Seal was used for successful hemostasis.  RECOMMENDATIONS:   Discharge in a.m. on aspirin and Plavix.  Ambulate later this evening      ASSESSMENT:    1. Coronary artery disease involving native coronary artery of native heart without angina pectoris   2. Hypertensive heart disease  without heart failure   3. Hyperlipidemia, unspecified hyperlipidemia type   4. Hypothyroidism, unspecified type      PLAN:  In order of problems listed above:  1. CAD: Recently underwent PCI with overlapping stents to RCA. Advised the patient on compliance with to what the pleasure therapy. Groin check in the right femoral area does show there is a small hematoma, however no obvious complication, no bruit on exam or pulsatile mass to suggest pseudoaneurysm. I have removed the bandage over right femoral area.  2. Ischemic cardiomyopathy with baseline EF 40-45%: No sign of heart failure on physical exam.  3. HTN: Despite the mildly elevated blood pressure, usually her blood pressure is quite well-controlled at home. We'll continue observation at this time.  4. HLD: Unclear cause why she is not on any statin medication. I will follow-up on this.  5. Hypothyroidism: On Synthroid    Medication Adjustments/Labs and Tests Ordered: Current medicines are reviewed at length with the patient today.  Concerns regarding medicines are outlined above.  Medication changes, Labs and Tests ordered today are listed in the Patient Instructions below. Patient Instructions  May take 650 mg of generic tylenol three time s a day as needed for arthritis pain.  no other changes with medications    Your physician recommends that you schedule a follow-up appointment in 2 months with Dr Sallyanne Kuster   If you need a refill on your cardiac medications before your next appointment, please call your pharmacy.    OFFICE PHONE NUMBER  [phone]     Hilbert Corrigan, [place]  02/22/2016 12:03 AM    [address], [place], [place]  [address] Phone: [phone]; Fax: [phone]

## 2016-02-21 NOTE — Patient Instructions (Addendum)
May take 650 mg of generic tylenol three time s a day as needed for arthritis pain.  no other changes with medications    Your physician recommends that you schedule a follow-up appointment in 2 months with Dr Sallyanne Kuster   If you need a refill on your cardiac medications before your next appointment, please call your pharmacy.    OFFICE PHONE NUMBER  [phone]

## 2016-02-23 ENCOUNTER — Telehealth: Payer: Self-pay | Admitting: Physician Assistant

## 2016-02-23 ENCOUNTER — Other Ambulatory Visit: Payer: Self-pay | Admitting: Physician Assistant

## 2016-02-23 DIAGNOSIS — I2510 Atherosclerotic heart disease of native coronary artery without angina pectoris: Secondary | ICD-10-CM

## 2016-02-23 DIAGNOSIS — I2583 Coronary atherosclerosis due to lipid rich plaque: Principal | ICD-10-CM

## 2016-02-23 MED ORDER — ATORVASTATIN CALCIUM 80 MG PO TABS: 80.0000 mg | ORAL_TABLET | Freq: Every day | ORAL | 0 refills | Status: DC

## 2016-02-23 MED ORDER — ATORVASTATIN CALCIUM 80 MG PO TABS: 80.0000 mg | ORAL_TABLET | Freq: Every day | ORAL | 3 refills | Status: DC

## 2016-02-23 NOTE — Telephone Encounter (Signed)
I have checked with the patient, she does not remember ever started on cholesterol medication. She says she was on low cholesterol medication several years ago however does not remember if she had any side effect from it. She continued to have joint pain and is going to see Dr. Veverly Fells next week. For now she will use 650 mg Tylenol as needed.   Since there is no obvious contraindication to starting medication and it with her recent diagnosis of CAD, I have started her on 80 mg Lipitor daily. I will send a 30 day supply to her local pharmacy and a one-year supply to her mail-in pharmacy. She will also need a fasting lipid panel and LFTs in 6 weeks.  Hilbert Corrigan PA Pager: [phone]

## 2016-03-01 DIAGNOSIS — M25511 Pain in right shoulder: Secondary | ICD-10-CM | POA: Diagnosis not present

## 2016-03-01 DIAGNOSIS — G8929 Other chronic pain: Secondary | ICD-10-CM | POA: Diagnosis not present

## 2016-03-01 DIAGNOSIS — M1711 Unilateral primary osteoarthritis, right knee: Secondary | ICD-10-CM | POA: Diagnosis not present

## 2016-03-02 ENCOUNTER — Ambulatory Visit: Payer: Medicare Other | Admitting: Cardiology

## 2016-04-05 DIAGNOSIS — M5136 Other intervertebral disc degeneration, lumbar region: Secondary | ICD-10-CM | POA: Diagnosis not present

## 2016-04-05 DIAGNOSIS — M9904 Segmental and somatic dysfunction of sacral region: Secondary | ICD-10-CM | POA: Diagnosis not present

## 2016-04-05 DIAGNOSIS — M25551 Pain in right hip: Secondary | ICD-10-CM | POA: Diagnosis not present

## 2016-04-05 DIAGNOSIS — Q762 Congenital spondylolisthesis: Secondary | ICD-10-CM | POA: Diagnosis not present

## 2016-04-05 DIAGNOSIS — M9905 Segmental and somatic dysfunction of pelvic region: Secondary | ICD-10-CM | POA: Diagnosis not present

## 2016-04-05 DIAGNOSIS — M9903 Segmental and somatic dysfunction of lumbar region: Secondary | ICD-10-CM | POA: Diagnosis not present

## 2016-04-05 DIAGNOSIS — M7062 Trochanteric bursitis, left hip: Secondary | ICD-10-CM | POA: Diagnosis not present

## 2016-04-05 DIAGNOSIS — Z6828 Body mass index (BMI) 28.0-28.9, adult: Secondary | ICD-10-CM | POA: Diagnosis not present

## 2016-04-10 ENCOUNTER — Other Ambulatory Visit: Payer: Self-pay

## 2016-04-10 DIAGNOSIS — I2583 Coronary atherosclerosis due to lipid rich plaque: Principal | ICD-10-CM

## 2016-04-10 DIAGNOSIS — I2510 Atherosclerotic heart disease of native coronary artery without angina pectoris: Secondary | ICD-10-CM

## 2016-04-10 NOTE — Telephone Encounter (Signed)
Called patient to remind her to complete her labs this week, she voiced understanding and stated she would have this done by the end of the week.

## 2016-04-10 NOTE — Addendum Note (Signed)
Addended by: Ulice Brilliant T on: 04/10/2016 04:30 PM   Modules accepted: Orders

## 2016-04-12 DIAGNOSIS — I2510 Atherosclerotic heart disease of native coronary artery without angina pectoris: Secondary | ICD-10-CM | POA: Diagnosis not present

## 2016-04-12 DIAGNOSIS — I2583 Coronary atherosclerosis due to lipid rich plaque: Secondary | ICD-10-CM | POA: Diagnosis not present

## 2016-04-12 LAB — LIPID PANEL
Cholesterol: 136 mg/dL (ref <200)
HDL: 53 mg/dL (ref >50)
LDL Cholesterol: 70 mg/dL (ref <100)
Total CHOL/HDL Ratio: 2.6 Ratio (ref <5.0)
Triglycerides: 67 mg/dL (ref <150)
VLDL: 13 mg/dL (ref <30)

## 2016-04-12 LAB — HEPATIC FUNCTION PANEL
ALBUMIN: 3.8 g/dL (ref 3.6–5.1)
ALK PHOS: 80 U/L (ref 33–130)
ALT: 14 U/L (ref 6–29)
AST: 15 U/L (ref 10–35)
BILIRUBIN DIRECT: 0.2 mg/dL (ref <=0.2)
BILIRUBIN TOTAL: 0.8 mg/dL (ref 0.2–1.2)
Indirect Bilirubin: 0.6 mg/dL (ref 0.2–1.2)
Total Protein: 6.5 g/dL (ref 6.1–8.1)

## 2016-04-14 DIAGNOSIS — N6310 Unspecified lump in the right breast, unspecified quadrant: Secondary | ICD-10-CM | POA: Diagnosis not present

## 2016-04-14 DIAGNOSIS — N6001 Solitary cyst of right breast: Secondary | ICD-10-CM | POA: Diagnosis not present

## 2016-04-17 ENCOUNTER — Other Ambulatory Visit: Payer: Self-pay | Admitting: *Deleted

## 2016-04-17 MED ORDER — METOPROLOL SUCCINATE ER 100 MG PO TB24
100.0000 mg | ORAL_TABLET | Freq: Every day | ORAL | 5 refills | Status: DC
Start: 2016-04-17 — End: 2017-04-24

## 2016-04-19 DIAGNOSIS — D485 Neoplasm of uncertain behavior of skin: Secondary | ICD-10-CM | POA: Diagnosis not present

## 2016-04-19 DIAGNOSIS — H1859 Other hereditary corneal dystrophies: Secondary | ICD-10-CM | POA: Diagnosis not present

## 2016-04-19 DIAGNOSIS — H01004 Unspecified blepharitis left upper eyelid: Secondary | ICD-10-CM | POA: Diagnosis not present

## 2016-04-19 DIAGNOSIS — H04123 Dry eye syndrome of bilateral lacrimal glands: Secondary | ICD-10-CM | POA: Diagnosis not present

## 2016-04-24 ENCOUNTER — Ambulatory Visit (INDEPENDENT_AMBULATORY_CARE_PROVIDER_SITE_OTHER): Payer: Medicare Other | Admitting: Cardiovascular Disease

## 2016-04-24 ENCOUNTER — Encounter: Payer: Self-pay | Admitting: Cardiovascular Disease

## 2016-04-24 VITALS — BP 154/73 | HR 74 | Ht 68.0 in | Wt 184.6 lb

## 2016-04-24 DIAGNOSIS — I2510 Atherosclerotic heart disease of native coronary artery without angina pectoris: Secondary | ICD-10-CM

## 2016-04-24 DIAGNOSIS — I10 Essential (primary) hypertension: Secondary | ICD-10-CM | POA: Diagnosis not present

## 2016-04-24 DIAGNOSIS — I2583 Coronary atherosclerosis due to lipid rich plaque: Secondary | ICD-10-CM | POA: Diagnosis not present

## 2016-04-24 DIAGNOSIS — E663 Overweight: Secondary | ICD-10-CM | POA: Diagnosis not present

## 2016-04-24 DIAGNOSIS — I5042 Chronic combined systolic (congestive) and diastolic (congestive) heart failure: Secondary | ICD-10-CM | POA: Diagnosis not present

## 2016-04-24 DIAGNOSIS — E7800 Pure hypercholesterolemia, unspecified: Secondary | ICD-10-CM

## 2016-04-24 MED ORDER — ESTROGENS CONJUGATED 0.45 MG PO TABS: 0.4500 mg | ORAL_TABLET | Freq: Every day | ORAL | 0 refills | Status: DC

## 2016-04-24 NOTE — Patient Instructions (Signed)
Dr Sallyanne Kuster has recommended making the following medication changes: 1. DECREASE Estrogen to 0.45 mg daily as directed  OKAY to take Diclofenac sparingly.  Dr Sallyanne Kuster recommends that you schedule a follow-up appointment in 4 months.  If you need a refill on your cardiac medications before your next appointment, please call your pharmacy.

## 2016-04-24 NOTE — Progress Notes (Signed)
Cardiology onsultation Note    Date:  04/25/2016   ID:  Kathy Murphy, DOB Sep 02, 1927, MRN [id number]  PCP:  Geoffery Lyons, MD  Cardiologist:   Sanda Klein, MD  Consultation requested for: Chest pain Chief Complaint  Patient presents with     . Chest Pain    History of Present Illness:  Kathy Murphy is a 83 y.o. female turning in follow-up roughly 2 months after cardiac catheterization and placement of 2 overlappingdrug-eluting stents in the subtotally occluded right coronary artery (2.5 x 38 mm Synergy). She also had high-grade stenoses in the diagonal artery, ramus intermedius branch, distal LAD which were left for medical therapy. Presence of widespread coronary lesions may explain the "false negative" perfusion pattern but with depressed left ventricular systolic function (17%). Upper limit of normal LVEDP at cath.   He is now taking a high-dose statin. Before treatment her LDL was mildly elevated at 121.  Her chest discomfort and shortness of breath have completely resolved after stenting the right coronary artery. However she was told to discontinue treatment with diclofenac and she has widespread musculoskeletal aches and pains. She worries about taking acetaminophen since her husband had nonalcoholic cirrhosis which she thinks was related to medications. She has not had GI bleeding recently or remotely. She is taking estrogen supplements for hot flashes. Her blood pressure is borderline high today, much lower typically at home.  Past Medical History:  Diagnosis Date  . Arthritis   . Cancer ([place])    thyroid  . GERD (gastroesophageal reflux disease)   . GI bleed 1980   AFTER POLYP EXCISION  . Heart disease, hypertensive   . Hypercholesterolemia   . Hypertension   . Hypothyroidism   . Myalgia   . Obesity   . Pain in joints   . Retinal detachment   . Unsteady gait   . UTI (urinary tract infection)   . Vomiting     Past Surgical History:  Procedure  Laterality Date  . ABDOMINAL HYSTERECTOMY  1977  . BREAST BIOPSY    . BREAST LUMPECTOMY     x5  . CARDIAC CATHETERIZATION N/A 02/08/2016   Procedure: Left Heart Cath and Coronary Angiography;  Surgeon: Belva Crome, MD;  Location: [facility];  Service: Cardiovascular;  Laterality: N/A;  . CARDIAC CATHETERIZATION N/A 02/11/2016   Procedure: Coronary Stent Intervention;  Surgeon: Belva Crome, MD;  Location: [facility];  Service: Cardiovascular;  Laterality: N/A;  . CERVICAL LAMINECTOMY    . [place]  . FOOT SURGERY    . NECK SURGERY    . THYROIDECTOMY  1957  . TOTAL HIP ARTHROPLASTY  2012   left    Current Medications: Outpatient Medications Prior to Visit  Medication Sig Dispense Refill  . aspirin 81 MG tablet Take 81 mg by mouth daily.      Marland Kitchen atorvastatin (LIPITOR) 80 MG tablet Take 1 tablet (80 mg total) by mouth daily. 90 tablet 3  . clopidogrel (PLAVIX) 75 MG tablet Take 1 tablet (75 mg total) by mouth daily with breakfast. 90 tablet 3  . diazepam (VALIUM) 2 MG tablet Take 2 mg by mouth every 6 (six) hours as needed for anxiety.    Marland Kitchen HYDROcodone-acetaminophen (NORCO/VICODIN) 5-325 MG tablet Take 1 tablet by mouth every 6 (six) hours as needed for moderate pain.    Marland Kitchen levothyroxine (SYNTHROID, LEVOTHROID) 137 MCG tablet Take 137 mcg by mouth daily.      Marland Kitchen  losartan (COZAAR) 100 MG tablet Take 100 mg by mouth daily.     . metoprolol succinate (TOPROL-XL) 100 MG 24 hr tablet Take 1 tablet (100 mg total) by mouth daily. Take with or immediately following a meal. 90 tablet 5  . pantoprazole (PROTONIX) 40 MG tablet Take 40 mg by mouth daily.      . clopidogrel (PLAVIX) 75 MG tablet Take 1 tablet (75 mg total) by mouth daily. (Patient not taking: Reported on 04/24/2016) 30 tablet 0   No facility-administered medications prior to visit.      Allergies:   Other and Latex   Social History   Social History  . Marital status: Widowed    Spouse name: N/A  .  Number of children: N/A  . Years of education: N/A   Social History Main Topics  . Smoking status: Never Smoker  . Smokeless tobacco: Never Used  . Alcohol use No  . Drug use: No  . Sexual activity: Not Asked   Other Topics Concern  . None   Social History Narrative  . None     Family History:  The patient's family history includes Heart disease in her mother.   ROS:   Please see the history of present illness.    ROS All other systems reviewed and are negative.   PHYSICAL EXAM:   VS:  BP (!) 154/73 (BP Location: Left Arm, Patient Position: Sitting, Cuff Size: Normal)   Pulse 74   Ht '5\' 8"'  (1.727 m)   Wt 83.7 kg (184 lb 9.6 oz)   SpO2 98%   BMI 28.07 kg/m    GEN: Well nourished, well developed, in no acute distress  HEENT: normal  Neck: no JVD, carotid bruits, or masses Cardiac: RRR; no murmurs, rubs, or gallops,no edema  Respiratory:  clear to auscultation bilaterally, normal work of breathing GI: soft, nontender, nondistended, + BS MS: no deformity or atrophy  Skin: warm and dry, no rash Neuro:  Alert and Oriented x 3, Strength and sensation are intact Psych: euthymic mood, full affect  Wt Readings from Last 3 Encounters:  04/24/16 83.7 kg (184 lb 9.6 oz)  02/21/16 84.3 kg (185 lb 12.8 oz)  02/12/16 85.6 kg (188 lb 11.4 oz)      Studies/Labs Reviewed:   EKG:  EKG is not ordered today.  Recent Results (from the past 2160 hour(s))  CBC     Status: None   Collection Time: 01/31/16  3:24 PM  Result Value Ref Range   WBC 7.3 3.8 - 10.8 K/uL   RBC 4.55 3.80 - 5.10 MIL/uL   Hemoglobin 13.7 11.7 - 15.5 g/dL   HCT 42.3 35.0 - 45.0 %   MCV 93.0 80.0 - 100.0 fL   MCH 30.1 27.0 - 33.0 pg   MCHC 32.4 32.0 - 36.0 g/dL   RDW 13.0 11.0 - 15.0 %   Platelets 207 140 - 400 K/uL   MPV 10.0 7.5 - 12.5 fL  Basic metabolic panel     Status: Abnormal   Collection Time: 01/31/16  3:24 PM  Result Value Ref Range   Sodium 137 135 - 146 mmol/L   Potassium 3.9 3.5 - 5.3  mmol/L   Chloride 104 98 - 110 mmol/L   CO2 25 20 - 31 mmol/L   Glucose, Bld 134 (H) 65 - 99 mg/dL   BUN 15 7 - 25 mg/dL   Creat 0.82 0.60 - 0.88 mg/dL    Comment:   For patients >  or = 81 years of age: The upper reference limit for Creatinine is approximately 13% higher for people identified as African-American.      Calcium 9.0 8.6 - 10.4 mg/dL  Protime-INR     Status: None   Collection Time: 01/31/16  3:24 PM  Result Value Ref Range   Prothrombin Time 10.3 9.0 - 11.5 sec    Comment:   For more information on this test, go to: http://education.[facility].com/faq/FAQ104      INR 1.0     Comment:   Reference Range                        0.9-1.1 Moderate-intensity Warfarin Therapy    2.0-3.0 Higher-intensity Warfarin Therapy      3.0-4.0     APTT     Status: None   Collection Time: 01/31/16  3:24 PM  Result Value Ref Range   aPTT 29 22 - 34 sec    Comment:   This test has not been validated for monitoring unfractionated heparin therapy. For testing that is validated for this type of therapy, please refer to the Heparin Anti-Xa assay (test code [id number]).   For additional information, please refer to http://education.[facility].com/faq/FAQ159 (This link is being provided for informational/educational purposes only.)     TSH     Status: None   Collection Time: 01/31/16  3:24 PM  Result Value Ref Range   TSH 0.89 mIU/L    Comment:   Reference Range   > or = 20 Years  0.40-4.50   Pregnancy Range First trimester  0.26-2.66 Second trimester 0.55-2.73 Third trimester  0.43-2.91     POCT Activated clotting time     Status: None   Collection Time: 02/08/16  8:13 AM  Result Value Ref Range   Activated Clotting Time 301 seconds  Basic metabolic panel     Status: None   Collection Time: 02/11/16 10:14 AM  Result Value Ref Range   Sodium 138 135 - 145 mmol/L   Potassium 3.7 3.5 - 5.1 mmol/L   Chloride 104 101 - 111 mmol/L   CO2 24 22 - 32 mmol/L    Glucose, Bld 97 65 - 99 mg/dL   BUN 14 6 - 20 mg/dL   Creatinine, Ser 0.71 0.44 - 1.00 mg/dL   Calcium 8.9 8.9 - 10.3 mg/dL   GFR calc non Af Amer >60 >60 mL/min   GFR calc Af Amer >60 >60 mL/min    Comment: (NOTE) The eGFR has been calculated using the CKD EPI equation. This calculation has not been validated in all clinical situations. eGFR's persistently <60 mL/min signify possible Chronic Kidney Disease.    Anion gap 10 5 - 15  CBC     Status: None   Collection Time: 02/11/16 10:14 AM  Result Value Ref Range   WBC 6.3 4.0 - 10.5 K/uL   RBC 4.38 3.87 - 5.11 MIL/uL   Hemoglobin 13.2 12.0 - 15.0 g/dL   HCT 41.1 36.0 - 46.0 %   MCV 93.8 78.0 - 100.0 fL   MCH 30.1 26.0 - 34.0 pg   MCHC 32.1 30.0 - 36.0 g/dL   RDW 13.1 11.5 - 15.5 %   Platelets 177 150 - 400 K/uL  POCT Activated clotting time     Status: None   Collection Time: 02/11/16  1:44 PM  Result Value Ref Range   Activated Clotting Time 285 seconds  POCT Activated clotting time     Status: None  Collection Time: 02/11/16  1:59 PM  Result Value Ref Range   Activated Clotting Time 323 seconds  Basic metabolic panel     Status: Abnormal   Collection Time: 02/12/16  4:24 AM  Result Value Ref Range   Sodium 139 135 - 145 mmol/L   Potassium 3.7 3.5 - 5.1 mmol/L   Chloride 103 101 - 111 mmol/L   CO2 26 22 - 32 mmol/L   Glucose, Bld 102 (H) 65 - 99 mg/dL   BUN 17 6 - 20 mg/dL   Creatinine, Ser 0.72 0.44 - 1.00 mg/dL   Calcium 8.5 (L) 8.9 - 10.3 mg/dL   GFR calc non Af Amer >60 >60 mL/min   GFR calc Af Amer >60 >60 mL/min    Comment: (NOTE) The eGFR has been calculated using the CKD EPI equation. This calculation has not been validated in all clinical situations. eGFR's persistently <60 mL/min signify possible Chronic Kidney Disease.    Anion gap 10 5 - 15  CBC     Status: None   Collection Time: 02/12/16  4:24 AM  Result Value Ref Range   WBC 7.1 4.0 - 10.5 K/uL   RBC 4.09 3.87 - 5.11 MIL/uL   Hemoglobin 12.2  12.0 - 15.0 g/dL   HCT 38.2 36.0 - 46.0 %   MCV 93.4 78.0 - 100.0 fL   MCH 29.8 26.0 - 34.0 pg   MCHC 31.9 30.0 - 36.0 g/dL   RDW 13.3 11.5 - 15.5 %   Platelets 172 150 - 400 K/uL  Lipid panel     Status: None   Collection Time: 04/10/16  8:57 AM  Result Value Ref Range   Cholesterol 136 <200 mg/dL   Triglycerides 67 <150 mg/dL   HDL 53 >50 mg/dL   Total CHOL/HDL Ratio 2.6 <5.0 Ratio   VLDL 13 <30 mg/dL   LDL Cholesterol 70 <100 mg/dL  Hepatic function panel     Status: None   Collection Time: 04/10/16  8:57 AM  Result Value Ref Range   Total Bilirubin 0.8 0.2 - 1.2 mg/dL   Bilirubin, Direct 0.2 <=0.2 mg/dL   Indirect Bilirubin 0.6 0.2 - 1.2 mg/dL   Alkaline Phosphatase 80 33 - 130 U/L   AST 15 10 - 35 U/L   ALT 14 6 - 29 U/L   Total Protein 6.5 6.1 - 8.1 g/dL   Albumin 3.8 3.6 - 5.1 g/dL      ASSESSMENT:    1. Coronary artery disease involving native coronary artery of native heart without angina pectoris   2. CHF (congestive heart failure), NYHA class I, chronic, combined ([place])   3. Hypercholesterolemia   4. Essential hypertension   5. Overweight (BMI 25.0-29.9)      PLAN:  In order of problems listed above:  1. CHF: She had mildly depressed left ventricular systolic function. EF might well improve following right coronary artery stenting. She is already taking maximum dose losartan and a modest dose of metoprolol succinate. Consider switching to a more potent angiotensin receptor blocker and gradually increasing the beta blocker dose. Upper limit of normal left ventricular end-diastolic pressure at cardiac catheterization. Clinically euvolemic, I do not think she will benefit from daily diuretics. Reinforced the importance of sodium restriction in her diet. 2. CAD: Should continue dual antiplatelet therapy for a minimum of 6 months (through June 2018), preferably for a whole year. It sounds like she really needs NSAIDs for her degenerative joint disease but I have  asked  her to use these sparingly. I think she can safely use acetaminophen and recommended over-the-counter doses and for a brief period of time, until we discontinue her clopidogrel. I worry somewhat about the prothrombotic effects of high-dose estrogen. Asked her to decrease the dose to 0.45 mg daily, with plan for continued efforts to titrate down to the lowest effective dose. 3. HLP: last LDL 70, at target. 4. HTN: Well controlled usually. No changes made to the medications today. 5. Overweight: She is is striving to reduce the intake of calories and carbohydrates.     Medication Adjustments/Labs and Tests Ordered: Current medicines are reviewed at length with the patient today.  Concerns regarding medicines are outlined above.  Medication changes, Labs and Tests ordered today are listed in the Patient Instructions below. Patient Instructions  Dr Sallyanne Kuster has recommended making the following medication changes: 1. DECREASE Estrogen to 0.45 mg daily as directed  OKAY to take Diclofenac sparingly.  Dr Sallyanne Kuster recommends that you schedule a follow-up appointment in 4 months.  If you need a refill on your cardiac medications before your next appointment, please call your pharmacy.  on the day of the procedure  Signed, Sanda Klein, MD  04/25/2016 12:57 PM    [address], [place], [place]  [address] Phone: [phone]; Fax: [phone]

## 2016-04-25 ENCOUNTER — Emergency Department (HOSPITAL_COMMUNITY): Payer: Medicare Other

## 2016-04-25 ENCOUNTER — Encounter (HOSPITAL_COMMUNITY): Payer: Self-pay

## 2016-04-25 ENCOUNTER — Emergency Department (HOSPITAL_COMMUNITY)
Admission: EM | Admit: 2016-04-25 | Discharge: 2016-04-25 | Disposition: A | Discharge status: Home / Self Care | Payer: Medicare Other | Attending: Emergency Medicine | Admitting: Emergency Medicine

## 2016-04-25 DIAGNOSIS — I2510 Atherosclerotic heart disease of native coronary artery without angina pectoris: Secondary | ICD-10-CM | POA: Insufficient documentation

## 2016-04-25 DIAGNOSIS — Z96642 Presence of left artificial hip joint: Secondary | ICD-10-CM | POA: Insufficient documentation

## 2016-04-25 DIAGNOSIS — I10 Essential (primary) hypertension: Secondary | ICD-10-CM | POA: Insufficient documentation

## 2016-04-25 DIAGNOSIS — Y929 Unspecified place or not applicable: Secondary | ICD-10-CM | POA: Diagnosis not present

## 2016-04-25 DIAGNOSIS — M25552 Pain in left hip: Secondary | ICD-10-CM | POA: Diagnosis not present

## 2016-04-25 DIAGNOSIS — Y939 Activity, unspecified: Secondary | ICD-10-CM | POA: Insufficient documentation

## 2016-04-25 DIAGNOSIS — Y999 Unspecified external cause status: Secondary | ICD-10-CM | POA: Insufficient documentation

## 2016-04-25 DIAGNOSIS — I110 Hypertensive heart disease with heart failure: Secondary | ICD-10-CM | POA: Diagnosis not present

## 2016-04-25 DIAGNOSIS — E039 Hypothyroidism, unspecified: Secondary | ICD-10-CM | POA: Diagnosis not present

## 2016-04-25 DIAGNOSIS — I509 Heart failure, unspecified: Secondary | ICD-10-CM | POA: Insufficient documentation

## 2016-04-25 DIAGNOSIS — Z7982 Long term (current) use of aspirin: Secondary | ICD-10-CM | POA: Insufficient documentation

## 2016-04-25 DIAGNOSIS — Z91040 Latex allergy status: Secondary | ICD-10-CM | POA: Insufficient documentation

## 2016-04-25 DIAGNOSIS — X58XXXA Exposure to other specified factors, initial encounter: Secondary | ICD-10-CM | POA: Insufficient documentation

## 2016-04-25 DIAGNOSIS — I5042 Chronic combined systolic (congestive) and diastolic (congestive) heart failure: Secondary | ICD-10-CM | POA: Insufficient documentation

## 2016-04-25 MED ORDER — ACETAMINOPHEN 500 MG PO TABS
1000.0000 mg | ORAL_TABLET | Freq: Once | ORAL | Status: AC
  Administered 2016-04-25: 1000 mg via ORAL
  Filled 2016-04-25: qty 2

## 2016-04-25 MED ORDER — DICLOFENAC SODIUM 50 MG PO TBEC
50.0000 mg | DELAYED_RELEASE_TABLET | Freq: Once | ORAL | Status: AC
  Administered 2016-04-25: 50 mg via ORAL
  Filled 2016-04-25: qty 1

## 2016-04-25 NOTE — ED Notes (Signed)
Patient transported to X-ray 

## 2016-04-25 NOTE — ED Notes (Signed)
Patient d/c'd self care.  F/U reviewed.  Patient verbalized understanding. 

## 2016-04-25 NOTE — ED Provider Notes (Signed)
[facility] DEPT Provider Note   CSN: [id number] Arrival date & time: 04/25/16  1330     History   Chief Complaint Chief Complaint  Patient presents with  . Hip Pain    HPI Kathy Murphy is a 83 y.o. female.  The history is provided by the patient.  Hip Pain  This is a new problem. Episode onset: 3-4 hours ago. The problem occurs constantly. The problem has not changed since onset.Pertinent negatives include no chest pain and no abdominal pain. The symptoms are aggravated by standing and twisting. Nothing relieves the symptoms. She has tried nothing for the symptoms.    Past Medical History:  Diagnosis Date  . Arthritis   . Cancer ([employer])    thyroid  . GERD (gastroesophageal reflux disease)   . GI bleed 1980   AFTER POLYP EXCISION  . Heart disease, hypertensive   . Hypercholesterolemia   . Hypertension   . Hypothyroidism   . Myalgia   . Obesity   . Pain in joints   . Retinal detachment   . Unsteady gait   . UTI (urinary tract infection)   . Vomiting     Patient Active Problem List   Diagnosis Date Noted  . CHF (congestive heart failure), NYHA class I, chronic, combined ([place]) 04/25/2016  . Essential hypertension 04/25/2016  . CAD (coronary artery disease), native coronary artery 02/11/2016  . Chronic total occlusion of coronary artery   . Coronary artery disease   . Unstable angina pectoris ([place])   . Chest pain 02/07/2016  . Abnormal nuclear cardiac imaging test 02/07/2016  . Nipple discharge in female 04/17/2011  . Heart disease, hypertensive   . Hypercholesterolemia   . Arthritis   . Unsteady gait   . Myalgia   . Pain in joints   . Retinal detachment   . Hypothyroidism   . GERD (gastroesophageal reflux disease)   . UTI (urinary tract infection)   . Vomiting   . Overweight (BMI 25.0-29.9)   . GI bleed     Past Surgical History:  Procedure Laterality Date  . ABDOMINAL HYSTERECTOMY  1977  . BREAST BIOPSY    . BREAST LUMPECTOMY     x5  .  CARDIAC CATHETERIZATION N/A 02/08/2016   Procedure: Left Heart Cath and Coronary Angiography;  Surgeon: Belva Crome, MD;  Location: [facility];  Service: Cardiovascular;  Laterality: N/A;  . CARDIAC CATHETERIZATION N/A 02/11/2016   Procedure: Coronary Stent Intervention;  Surgeon: Belva Crome, MD;  Location: [facility];  Service: Cardiovascular;  Laterality: N/A;  . CERVICAL LAMINECTOMY    . [place]  . FOOT SURGERY    . NECK SURGERY    . THYROIDECTOMY  1957  . TOTAL HIP ARTHROPLASTY  2012   left    OB History    No data available       Home Medications    Prior to Admission medications   Medication Sig Start Date End Date Taking? Authorizing Provider  aspirin 81 MG tablet Take 81 mg by mouth daily.      Historical Provider, MD  atorvastatin (LIPITOR) 80 MG tablet Take 1 tablet (80 mg total) by mouth daily. 02/23/16 05/23/16  Almyra Deforest, PA  clopidogrel (PLAVIX) 75 MG tablet Take 1 tablet (75 mg total) by mouth daily with breakfast. 02/21/16   Almyra Deforest, PA  diazepam (VALIUM) 2 MG tablet Take 2 mg by mouth every 6 (six) hours as needed for anxiety.  Historical Provider, MD  estrogens, conjugated, (PREMARIN) 0.45 MG tablet Take 1 tablet (0.45 mg total) by mouth daily. Take daily for 21 days then do not take for 7 days. 04/24/16   Mihai Croitoru, MD  HYDROcodone-acetaminophen (NORCO/VICODIN) 5-325 MG tablet Take 1 tablet by mouth every 6 (six) hours as needed for moderate pain.    Historical Provider, MD  levothyroxine (SYNTHROID, LEVOTHROID) 137 MCG tablet Take 137 mcg by mouth daily.      Historical Provider, MD  losartan (COZAAR) 100 MG tablet Take 100 mg by mouth daily.  03/30/11   Historical Provider, MD  metoprolol succinate (TOPROL-XL) 100 MG 24 hr tablet Take 1 tablet (100 mg total) by mouth daily. Take with or immediately following a meal. 04/17/16   Mihai Croitoru, MD  Omega-3 Fatty Acids (FISH OIL) 1000 MG CAPS Take 1,000 mg by mouth daily.     Historical Provider, MD  pantoprazole (PROTONIX) 40 MG tablet Take 40 mg by mouth daily.      Historical Provider, MD    Family History Family History  Problem Relation Age of Onset  . Heart disease Mother     Social History Social History  Substance Use Topics  . Smoking status: Never Smoker  . Smokeless tobacco: Never Used  . Alcohol use No     Allergies   Other and Latex   Review of Systems Review of Systems  Cardiovascular: Negative for chest pain.  Gastrointestinal: Negative for abdominal pain.  All other systems reviewed and are negative.    Physical Exam Updated Vital Signs BP 151/74 (BP Location: Left Arm)   Pulse 69   Temp 97.7 F (36.5 C) (Oral)   Resp 16   Ht 5\' 8"  (1.727 m)   Wt 184 lb (83.5 kg)   SpO2 100%   BMI 27.98 kg/m   Physical Exam  Constitutional: She is oriented to person, place, and time. She appears well-developed and well-nourished. No distress.  HENT:  Head: Normocephalic.  Nose: Nose normal.  Eyes: Conjunctivae are normal.  Neck: Neck supple. No tracheal deviation present.  Cardiovascular: Normal rate and regular rhythm.   Pulmonary/Chest: Effort normal. No respiratory distress.  Abdominal: Soft. She exhibits no distension.  Musculoskeletal:  Mild medial left hip tenderness. Normal range of motion, normal sensation and strength  Neurological: She is alert and oriented to person, place, and time.  Skin: Skin is warm and dry.  Psychiatric: She has a normal mood and affect.     ED Treatments / Results  Labs (all labs ordered are listed, but only abnormal results are displayed) Labs Reviewed - No data to display  EKG  EKG Interpretation None       Radiology Dg Hip Unilat W Or Wo Pelvis 2-3 Views Left  Result Date: 04/25/2016 CLINICAL DATA:  Two months of left hip pain. Today she felt a pop and now unable to bear weight. EXAM: DG HIP (WITH OR WITHOUT PELVIS) 2-3V LEFT COMPARISON:  Postoperative radiographs dated  December 02, 2010 FINDINGS: There is a prosthetic left hip joint in place. Radiographic positioning of the prosthetic components appears good. There is no evidence of dislocation. The interface with the native bone appears normal. The observed portions of the bony pelvis exhibit no acute abnormalities. There are degenerative changes of the right hip and right SI joint. IMPRESSION: No acute abnormality of the native bone or left hip prosthesis. Electronically Signed   By: David  Martinique M.D.   On: 04/25/2016 15:49  Procedures Procedures (including critical care time)  Medications Ordered in ED Medications  acetaminophen (TYLENOL) tablet 1,000 mg (1,000 mg Oral Given 04/25/16 1644)  diclofenac (VOLTAREN) EC tablet 50 mg (50 mg Oral Given 04/25/16 1644)     Initial Impression / Assessment and Plan / ED Course  I have reviewed the triage vital signs and the nursing notes.  Pertinent labs & imaging results that were available during my care of the patient were reviewed by me and considered in my medical decision making (see chart for details).     82 year old female presents with acute left hip pain after twisting in the seat in church. No traumatic onset. She has full range of motion and pain over the musculature medial to her hip. Plain film negative for acute fracture or hardware complication. She states that normally diclofenac works but she has been told to use it only sparingly by her cardiology team. She was given a dose of diclofenac and Tylenol here and was able to handle it without difficulty at her baseline. Supportive care measures recommended primary care follow-up as needed with return precautions for worsening or new concerning symptoms.  Final Clinical Impressions(s) / ED Diagnoses   Final diagnoses:  Left hip pain    New Prescriptions New Prescriptions   No medications on file     Leo Grosser, MD 04/25/16 [phone]

## 2016-04-25 NOTE — ED Notes (Signed)
No d/c vitals done patient was ready to leave and in a wheel chair.

## 2016-04-25 NOTE — ED Triage Notes (Signed)
Patient c/o left hip pain. Patient states she has been having left hip pain x 2 months, but today she felt a pop and is now unable to bear weight without having pain. Patient states she had an appointment tomorrow to receive an injection for bursitis.

## 2016-04-26 DIAGNOSIS — M25511 Pain in right shoulder: Secondary | ICD-10-CM | POA: Diagnosis not present

## 2016-04-26 DIAGNOSIS — G8929 Other chronic pain: Secondary | ICD-10-CM | POA: Diagnosis not present

## 2016-04-27 ENCOUNTER — Ambulatory Visit (INDEPENDENT_AMBULATORY_CARE_PROVIDER_SITE_OTHER): Payer: Medicare Other | Admitting: Physician Assistant

## 2016-04-27 DIAGNOSIS — M25552 Pain in left hip: Secondary | ICD-10-CM

## 2016-04-27 MED ORDER — LIDOCAINE HCL 1 % IJ SOLN
3.0000 mL | INTRAMUSCULAR | Status: AC | PRN
  Administered 2016-04-27: 3 mL

## 2016-04-27 MED ORDER — METHYLPREDNISOLONE ACETATE 40 MG/ML IJ SUSP
40.0000 mg | INTRAMUSCULAR | Status: AC | PRN
  Administered 2016-04-27: 40 mg via INTRA_ARTICULAR

## 2016-04-27 NOTE — Progress Notes (Signed)
Office Visit Note   Patient: Kathy Murphy           Date of Birth: Feb 24, 1928           MRN: [id number] Visit Date: 04/27/2016              Requested by: Burnard Bunting, MD [address] [place], [place] [address] PCP: Geoffery Lyons, MD   Assessment & Plan: Visit Diagnoses:  1. Pain of left hip joint     Plan:IT band stretching exercises are shown to the patient did have her demonstrate these. We'll see her back on a when necessary basis pain persist or becomes worse. Questions encouraged and answered. I did review her x-rays with her today.   Follow-Up Instructions: Return if symptoms worsen or fail to improve.   Orders:  Orders Placed This Encounter  Procedures  . Large Joint Injection/Arthrocentesis   No orders of the defined types were placed in this encounter.     Procedures: Large Joint Inj Date/Time: 04/27/2016 3:54 PM Performed by: Pete Pelt Authorized by: Pete Pelt   Consent Given by:  Patient Indications:  Pain Location:  Hip Site:  L greater trochanter Needle Size:  22 G Needle Length:  1.5 inches Approach:  Lateral Ultrasound Guidance: No   Fluoroscopic Guidance: No   Arthrogram: No   Medications:  40 mg methylPREDNISolone acetate 40 MG/ML; 3 mL lidocaine 1 % Aspiration Attempted: No   Patient tolerance:  Patient tolerated the procedure well with no immediate complications     Clinical Data: No additional findings.   Subjective: LEFT HIP PAIN  HPI Kathy Murphy is a 83 year old female known to Dr. Trevor Mace service then several years since we've seen her in with left hip pain. She had a left total hip arthroplasty performed in 2012 by Dr. Ninfa Linden. On 04/25/2016 she developed pain in 4 left hip she was just standing up no particular injury though. The grafts were obtained of her pelvis and her left hip at that ER visit and showed that the left total hip was well located and that there was no evidence of hardware failure or  loosening. No acute fracture left hip. Review of Systems   Objective: Vital Signs: There were no vitals taken for this visit.  Physical Exam  Constitutional: She is oriented to person, place, and time. She appears well-developed and well-nourished. No distress.  Pulmonary/Chest: Effort normal.  Neurological: She is alert and oriented to person, place, and time.  Psychiatric: She has a normal mood and affect. Her behavior is normal.    Ortho Exam Left hip good range of motion. She does walk with the assistance of a walker and a slight antalgic gait. She has tenderness over her left trochanteric region. Left hip surgical incision is well-healed no erythema no abnormal warmth noted erythema no ecchymosis over the hip region. Specialty Comments:  No specialty comments available.  Imaging: No results found.   PMFS History: Patient Active Problem List   Diagnosis Date Noted  . CHF (congestive heart failure), NYHA class I, chronic, combined ([place]) 04/25/2016  . Essential hypertension 04/25/2016  . CAD (coronary artery disease), native coronary artery 02/11/2016  . Chronic total occlusion of coronary artery   . Coronary artery disease   . Unstable angina pectoris ([place])   . Chest pain 02/07/2016  . Abnormal nuclear cardiac imaging test 02/07/2016  . Nipple discharge in female 04/17/2011  . Heart disease, hypertensive   . Hypercholesterolemia   . Arthritis   .  Unsteady gait   . Myalgia   . Pain in joints   . Retinal detachment   . Hypothyroidism   . GERD (gastroesophageal reflux disease)   . UTI (urinary tract infection)   . Vomiting   . Overweight (BMI 25.0-29.9)   . GI bleed    Past Medical History:  Diagnosis Date  . Arthritis   . Cancer ([place])    thyroid  . GERD (gastroesophageal reflux disease)   . GI bleed 1980   AFTER POLYP EXCISION  . Heart disease, hypertensive   . Hypercholesterolemia   . Hypertension   . Hypothyroidism   . Myalgia   . Obesity   . Pain in  joints   . Retinal detachment   . Unsteady gait   . UTI (urinary tract infection)   . Vomiting     Family History  Problem Relation Age of Onset  . Heart disease Mother     Past Surgical History:  Procedure Laterality Date  . ABDOMINAL HYSTERECTOMY  1977  . BREAST BIOPSY    . BREAST LUMPECTOMY     x5  . CARDIAC CATHETERIZATION N/A 02/08/2016   Procedure: Left Heart Cath and Coronary Angiography;  Surgeon: Belva Crome, MD;  Location: [facility];  Service: Cardiovascular;  Laterality: N/A;  . CARDIAC CATHETERIZATION N/A 02/11/2016   Procedure: Coronary Stent Intervention;  Surgeon: Belva Crome, MD;  Location: [facility];  Service: Cardiovascular;  Laterality: N/A;  . CERVICAL LAMINECTOMY    . [place]  . FOOT SURGERY    . NECK SURGERY    . THYROIDECTOMY  1957  . TOTAL HIP ARTHROPLASTY  2012   left   Social History   Occupational History  . Not on file.   Social History Main Topics  . Smoking status: Never Smoker  . Smokeless tobacco: Never Used  . Alcohol use No  . Drug use: No  . Sexual activity: Not on file

## 2016-04-28 ENCOUNTER — Telehealth: Payer: Self-pay | Admitting: Cardiovascular Disease

## 2016-04-28 NOTE — Telephone Encounter (Signed)
Sounds fine to me

## 2016-04-28 NOTE — Telephone Encounter (Signed)
Please call,pt concerning her Premarin.If not there,please [phone].

## 2016-04-28 NOTE — Telephone Encounter (Signed)
Returned call to patient she stated she wanted Dr.Croitoru to know she cannot afford estrogen 0.45 mg cost $338.Stated she is going to take estrogen 0.9 mg 1/2 tablet daily she can cut in half with no problem.Message sent to Dr.Croitoru.

## 2016-04-28 NOTE — Telephone Encounter (Signed)
Returned call to patient left message on personal voice mail Dr.Croitoru advised ok.

## 2016-05-08 ENCOUNTER — Encounter (HOSPITAL_COMMUNITY): Payer: Self-pay | Admitting: Internal Medicine

## 2016-05-08 ENCOUNTER — Telehealth (INDEPENDENT_AMBULATORY_CARE_PROVIDER_SITE_OTHER): Payer: Self-pay | Admitting: *Deleted

## 2016-05-08 ENCOUNTER — Telehealth: Payer: Self-pay | Admitting: Cardiovascular Disease

## 2016-05-08 NOTE — Telephone Encounter (Signed)
Spoke with patient   she states her pain in joints hips ,wrist are gone since stopping Lipitor 80 mg .  She states she had body aches off/on since starting medication in Dec 2017.    RN suggest to patient re challenge medication for 7- 10 days and contact office to see if symptoms return.

## 2016-05-08 NOTE — Telephone Encounter (Signed)
See below, she just recently had injection. No better

## 2016-05-08 NOTE — Progress Notes (Signed)
Mailed letter with [facility] to pt ... KJ  °

## 2016-05-08 NOTE — Telephone Encounter (Signed)
Patient called in this afternoon in regards to her left leg pain that is not getting any better? She would like to know what Benita Stabile suggest for her to do? She isn't sure when her last injection was. Her CB # [phone]. Thank you

## 2016-05-08 NOTE — Telephone Encounter (Signed)
Send her to PT for IT band stretching left hip

## 2016-05-08 NOTE — Telephone Encounter (Signed)
New Message  Pt voiced she is in pain and stopped taking Lipitor for three days and she's not hurting.  Pt thinks meds is causing body aches and feels better without it.  Pt voiced wondering if something else substitutes Lipitor.  Please f/u with pt

## 2016-05-08 NOTE — Telephone Encounter (Signed)
Agree. If symptoms return, change to Crestor 20 mg daily.

## 2016-05-09 ENCOUNTER — Telehealth (INDEPENDENT_AMBULATORY_CARE_PROVIDER_SITE_OTHER): Payer: Self-pay | Admitting: *Deleted

## 2016-05-09 NOTE — Telephone Encounter (Signed)
appt made tomorrow

## 2016-05-09 NOTE — Telephone Encounter (Signed)
Patient called in this morning again in regards to wanting some relief for her hip I advised her of Gil's suggestion for physical therapy but she stated she was hurting to bad to do that at this time, and Artis Delay and Ninfa Linden do not have any availabilities this week. She would greatly appreciate a call back today please regarding this if possible. Her CB # [phone]. Thank you

## 2016-05-10 ENCOUNTER — Ambulatory Visit (INDEPENDENT_AMBULATORY_CARE_PROVIDER_SITE_OTHER): Payer: Medicare Other | Admitting: Physician Assistant

## 2016-05-10 DIAGNOSIS — I2583 Coronary atherosclerosis due to lipid rich plaque: Secondary | ICD-10-CM | POA: Diagnosis not present

## 2016-05-10 DIAGNOSIS — G8929 Other chronic pain: Secondary | ICD-10-CM | POA: Diagnosis not present

## 2016-05-10 DIAGNOSIS — M25552 Pain in left hip: Secondary | ICD-10-CM

## 2016-05-10 DIAGNOSIS — I2510 Atherosclerotic heart disease of native coronary artery without angina pectoris: Secondary | ICD-10-CM | POA: Diagnosis not present

## 2016-05-10 DIAGNOSIS — M7062 Trochanteric bursitis, left hip: Secondary | ICD-10-CM

## 2016-05-10 DIAGNOSIS — M25511 Pain in right shoulder: Secondary | ICD-10-CM | POA: Diagnosis not present

## 2016-05-10 NOTE — Progress Notes (Signed)
Office Visit Note   Patient: Kathy Murphy           Date of Birth: [birth year]           MRN: [id number] Visit Date: 05/10/2016              Requested by: Burnard Bunting, MD [address] [place], [place] [address] PCP: Geoffery Lyons, MD   Assessment & Plan: Visit Diagnoses: Left hip pain  Plan: We will send her to physical therapy for IT band stretching. Also hamstring stretching. See her back in 6 weeks as she still having pain over the trochanteric region and consider cortisone injection at that time.  Follow-Up Instructions: Return in about 6 weeks (around 06/21/2016).   Orders:  No orders of the defined types were placed in this encounter.  No orders of the defined types were placed in this encounter.     Procedures: No procedures performed   Clinical Data: No additional findings.   Subjective: Left hip pain.   Kathy Murphy is today status post left hip injection. States pain is getting worse lateral aspect of the hip. She denies any numbness tingling down the leg. She has pain mainly lateral aspect of the hip she has pain whenever she lies on the hip at night. No pain with 1 her back. She is having no back pain.  Review of Systems Denies numbness tingling down either leg or back pain.  Objective: Vital Signs: There were no vitals taken for this visit.  Physical Exam  Constitutional: She is oriented to person, place, and time. She appears well-developed and well-nourished.  Neurological: She is alert and oriented to person, place, and time.  Psychiatric: She has a normal mood and affect. Her behavior is normal.    Ortho Exam Good range of motion of the left hip discomfort with external rotation H strain. Tenderness over the left hip trochanteric region.  Tenderness down the entire IT band with palpation. Tight hamstrings bilaterally. Specialty Comments:  No specialty comments available.  Imaging: No results found.   PMFS History: Patient Active  Problem List   Diagnosis Date Noted  . CHF (congestive heart failure), NYHA class I, chronic, combined ([place]) 04/25/2016  . Essential hypertension 04/25/2016  . CAD (coronary artery disease), native coronary artery 02/11/2016  . Chronic total occlusion of coronary artery   . Coronary artery disease   . Unstable angina pectoris ([place])   . Chest pain 02/07/2016  . Abnormal nuclear cardiac imaging test 02/07/2016  . Nipple discharge in female 04/17/2011  . Heart disease, hypertensive   . Hypercholesterolemia   . Arthritis   . Unsteady gait   . Myalgia   . Pain in joints   . Retinal detachment   . Hypothyroidism   . GERD (gastroesophageal reflux disease)   . UTI (urinary tract infection)   . Vomiting   . Overweight (BMI 25.0-29.9)   . GI bleed    Past Medical History:  Diagnosis Date  . Arthritis   . Cancer ([place])    thyroid  . GERD (gastroesophageal reflux disease)   . GI bleed 1980   AFTER POLYP EXCISION  . Heart disease, hypertensive   . Hypercholesterolemia   . Hypertension   . Hypothyroidism   . Myalgia   . Obesity   . Pain in joints   . Retinal detachment   . Unsteady gait   . UTI (urinary tract infection)   . Vomiting     Family History  Problem  Relation Age of Onset  . Heart disease Mother     Past Surgical History:  Procedure Laterality Date  . ABDOMINAL HYSTERECTOMY  1977  . BREAST BIOPSY    . BREAST LUMPECTOMY     x5  . CARDIAC CATHETERIZATION N/A 02/08/2016   Procedure: Left Heart Cath and Coronary Angiography;  Surgeon: Belva Crome, MD;  Location: [facility];  Service: Cardiovascular;  Laterality: N/A;  . CARDIAC CATHETERIZATION N/A 02/11/2016   Procedure: Coronary Stent Intervention;  Surgeon: Belva Crome, MD;  Location: [facility];  Service: Cardiovascular;  Laterality: N/A;  . CERVICAL LAMINECTOMY    . [place]  . FOOT SURGERY    . NECK SURGERY    . THYROIDECTOMY  1957  . TOTAL HIP ARTHROPLASTY  2012   left    Social History   Occupational History  . Not on file.   Social History Main Topics  . Smoking status: Never Smoker  . Smokeless tobacco: Never Used  . Alcohol use No  . Drug use: No  . Sexual activity: Not on file

## 2016-05-30 DIAGNOSIS — M7062 Trochanteric bursitis, left hip: Secondary | ICD-10-CM | POA: Diagnosis not present

## 2016-06-05 ENCOUNTER — Ambulatory Visit: Payer: Medicare Other | Admitting: Podiatry

## 2016-06-05 DIAGNOSIS — H31002 Unspecified chorioretinal scars, left eye: Secondary | ICD-10-CM | POA: Diagnosis not present

## 2016-06-05 DIAGNOSIS — H52201 Unspecified astigmatism, right eye: Secondary | ICD-10-CM | POA: Diagnosis not present

## 2016-06-05 DIAGNOSIS — H43811 Vitreous degeneration, right eye: Secondary | ICD-10-CM | POA: Diagnosis not present

## 2016-06-05 DIAGNOSIS — Z961 Presence of intraocular lens: Secondary | ICD-10-CM | POA: Diagnosis not present

## 2016-06-06 DIAGNOSIS — M1711 Unilateral primary osteoarthritis, right knee: Secondary | ICD-10-CM | POA: Diagnosis not present

## 2016-06-07 DIAGNOSIS — M7062 Trochanteric bursitis, left hip: Secondary | ICD-10-CM | POA: Diagnosis not present

## 2016-06-12 DIAGNOSIS — M7062 Trochanteric bursitis, left hip: Secondary | ICD-10-CM | POA: Diagnosis not present

## 2016-06-15 DIAGNOSIS — M7062 Trochanteric bursitis, left hip: Secondary | ICD-10-CM | POA: Diagnosis not present

## 2016-06-20 DIAGNOSIS — M7062 Trochanteric bursitis, left hip: Secondary | ICD-10-CM | POA: Diagnosis not present

## 2016-06-21 ENCOUNTER — Ambulatory Visit (INDEPENDENT_AMBULATORY_CARE_PROVIDER_SITE_OTHER): Payer: Medicare Other | Admitting: Physician Assistant

## 2016-06-26 DIAGNOSIS — M7062 Trochanteric bursitis, left hip: Secondary | ICD-10-CM | POA: Diagnosis not present

## 2016-07-03 DIAGNOSIS — M7062 Trochanteric bursitis, left hip: Secondary | ICD-10-CM | POA: Diagnosis not present

## 2016-07-05 DIAGNOSIS — M7062 Trochanteric bursitis, left hip: Secondary | ICD-10-CM | POA: Diagnosis not present

## 2016-07-10 DIAGNOSIS — M7062 Trochanteric bursitis, left hip: Secondary | ICD-10-CM | POA: Diagnosis not present

## 2016-07-11 DIAGNOSIS — M1711 Unilateral primary osteoarthritis, right knee: Secondary | ICD-10-CM | POA: Diagnosis not present

## 2016-07-12 ENCOUNTER — Ambulatory Visit (INDEPENDENT_AMBULATORY_CARE_PROVIDER_SITE_OTHER): Payer: Medicare Other | Admitting: Podiatry

## 2016-07-12 ENCOUNTER — Ambulatory Visit: Payer: Medicare Other | Admitting: Podiatry

## 2016-07-12 DIAGNOSIS — L84 Corns and callosities: Secondary | ICD-10-CM

## 2016-07-12 NOTE — Patient Instructions (Signed)
Return as needed for trimming of painful corn on the end of the second right toe

## 2016-07-13 NOTE — Progress Notes (Signed)
Patient ID: Kathy Murphy, female   DOB: 1927-06-01, 83 y.o.   MRN: [id number]     Subjective: This patient presents today complaining of a painful corn on the distal aspect the second right toe. This lesion was last debrided on the visit of 12/15/2014  Objective: Orientated 3 DP pulses 2/4 bilaterally PT pulses 1/4 bilaterally Capillary reflex immediate bilaterally Atrophic skin with absent hair growth bilaterally Pes planus with excessive pronation right greater than left Rigid hammertoe second left with distal keratoses  Assessment: Painful corn distal second right toe associated with hammertoe deformity and hyperpronation flatfoot, right  Plan: Debridement of corn distal second right toe without a bleeding Continue wearing toe prop on right foot Return as needed

## 2016-07-18 DIAGNOSIS — M7062 Trochanteric bursitis, left hip: Secondary | ICD-10-CM | POA: Diagnosis not present

## 2016-07-20 DIAGNOSIS — Z6827 Body mass index (BMI) 27.0-27.9, adult: Secondary | ICD-10-CM | POA: Diagnosis not present

## 2016-07-20 DIAGNOSIS — R2689 Other abnormalities of gait and mobility: Secondary | ICD-10-CM | POA: Diagnosis not present

## 2016-07-20 DIAGNOSIS — R42 Dizziness and giddiness: Secondary | ICD-10-CM | POA: Diagnosis not present

## 2016-07-20 DIAGNOSIS — Q762 Congenital spondylolisthesis: Secondary | ICD-10-CM | POA: Diagnosis not present

## 2016-07-20 DIAGNOSIS — K219 Gastro-esophageal reflux disease without esophagitis: Secondary | ICD-10-CM | POA: Diagnosis not present

## 2016-07-20 DIAGNOSIS — I2510 Atherosclerotic heart disease of native coronary artery without angina pectoris: Secondary | ICD-10-CM | POA: Diagnosis not present

## 2016-07-20 DIAGNOSIS — M25511 Pain in right shoulder: Secondary | ICD-10-CM | POA: Diagnosis not present

## 2016-07-20 DIAGNOSIS — R079 Chest pain, unspecified: Secondary | ICD-10-CM | POA: Diagnosis not present

## 2016-07-20 DIAGNOSIS — M25461 Effusion, right knee: Secondary | ICD-10-CM | POA: Diagnosis not present

## 2016-07-20 DIAGNOSIS — M7062 Trochanteric bursitis, left hip: Secondary | ICD-10-CM | POA: Diagnosis not present

## 2016-07-20 DIAGNOSIS — K589 Irritable bowel syndrome without diarrhea: Secondary | ICD-10-CM | POA: Diagnosis not present

## 2016-07-20 DIAGNOSIS — E784 Other hyperlipidemia: Secondary | ICD-10-CM | POA: Diagnosis not present

## 2016-08-14 ENCOUNTER — Encounter: Payer: Self-pay | Admitting: Cardiovascular Disease

## 2016-08-14 ENCOUNTER — Ambulatory Visit (INDEPENDENT_AMBULATORY_CARE_PROVIDER_SITE_OTHER): Payer: Medicare Other | Admitting: Cardiovascular Disease

## 2016-08-14 VITALS — BP 138/80 | HR 70 | Ht 68.0 in | Wt 183.0 lb

## 2016-08-14 DIAGNOSIS — I5042 Chronic combined systolic (congestive) and diastolic (congestive) heart failure: Secondary | ICD-10-CM | POA: Diagnosis not present

## 2016-08-14 DIAGNOSIS — I10 Essential (primary) hypertension: Secondary | ICD-10-CM

## 2016-08-14 DIAGNOSIS — E663 Overweight: Secondary | ICD-10-CM | POA: Diagnosis not present

## 2016-08-14 DIAGNOSIS — I2510 Atherosclerotic heart disease of native coronary artery without angina pectoris: Secondary | ICD-10-CM

## 2016-08-14 DIAGNOSIS — E7800 Pure hypercholesterolemia, unspecified: Secondary | ICD-10-CM | POA: Diagnosis not present

## 2016-08-14 DIAGNOSIS — I2583 Coronary atherosclerosis due to lipid rich plaque: Secondary | ICD-10-CM

## 2016-08-14 NOTE — Progress Notes (Signed)
Cardiology onsultation Note    Date:  08/15/2016   ID:  KELVIN BURPEE, DOB 05/25/1927, MRN [id number]  PCP:  Burnard Bunting, MD  Cardiologist:   Sanda Klein, MD  Consultation requested for: Chest pain Chief Complaint  Patient presents with     . Chest Pain    History of Present Illness:  SHARISSA BRIERLEY is a 82 y.o. female turning in follow-up roughly 6 months after cardiac catheterization and placement of 2 overlapping drug-eluting stents in the subtotally occluded right coronary artery (2.5 x 38 mm Synergy). She also had high-grade stenoses in the diagonal artery, ramus intermedius branch, distal LAD which were left for medical therapy. Presence of widespread coronary lesions may explain the "false negative" perfusion pattern but with depressed left ventricular systolic function (41%). Upper limit of normal LVEDP at cath.   She stopped her nonsteroidal anti-inflammatory drug 1 she started dual antiplatelet therapy, but this severely restricted her mobility. She could barely get out of bed due to back pain. She has restarted taking diclofenac. Thankfully she has not had any GI upset or any GI bleeding.  She appears to be tolerating statins without side effects. LDL is down to 70. Once she restarted the nsaid, her aches and pains returned to baseline. She is still taking estrogen supplements for hot flashes.  She denies dizziness, palpitations, syncope, focal neurological events, leg edema, claudication, angina or dyspnea at rest or with activity.  Past Medical History:  Diagnosis Date  . Arthritis   . Cancer ([place])    thyroid  . GERD (gastroesophageal reflux disease)   . GI bleed 1980   AFTER POLYP EXCISION  . Heart disease, hypertensive   . Hypercholesterolemia   . Hypertension   . Hypothyroidism   . Myalgia   . Obesity   . Pain in joints   . Retinal detachment   . Unsteady gait   . UTI (urinary tract infection)   . Vomiting     Past Surgical History:  Procedure  Laterality Date  . ABDOMINAL HYSTERECTOMY  1977  . BREAST BIOPSY    . BREAST LUMPECTOMY     x5  . CARDIAC CATHETERIZATION N/A 02/08/2016   Procedure: Left Heart Cath and Coronary Angiography;  Surgeon: Belva Crome, MD;  Location: [facility];  Service: Cardiovascular;  Laterality: N/A;  . CARDIAC CATHETERIZATION N/A 02/11/2016   Procedure: Coronary Stent Intervention;  Surgeon: Belva Crome, MD;  Location: [facility];  Service: Cardiovascular;  Laterality: N/A;  . CERVICAL LAMINECTOMY    . [place]  . FOOT SURGERY    . NECK SURGERY    . THYROIDECTOMY  1957  . TOTAL HIP ARTHROPLASTY  2012   left    Current Medications: Outpatient Medications Prior to Visit  Medication Sig Dispense Refill  . aspirin 81 MG tablet Take 81 mg by mouth daily.      . clopidogrel (PLAVIX) 75 MG tablet Take 1 tablet (75 mg total) by mouth daily with breakfast. 90 tablet 3  . diazepam (VALIUM) 2 MG tablet Take 1 mg by mouth 2 (two) times daily as needed for anxiety.     Marland Kitchen estrogens, conjugated, (PREMARIN) 0.45 MG tablet Take 1 tablet (0.45 mg total) by mouth daily. Take daily for 21 days then do not take for 7 days. 90 tablet 0  . levothyroxine (SYNTHROID, LEVOTHROID) 137 MCG tablet Take 137 mcg by mouth daily.      Marland Kitchen losartan (COZAAR)  100 MG tablet Take 100 mg by mouth daily.     . metoprolol succinate (TOPROL-XL) 100 MG 24 hr tablet Take 1 tablet (100 mg total) by mouth daily. Take with or immediately following a meal. 90 tablet 5  . pantoprazole (PROTONIX) 40 MG tablet Take 40 mg by mouth daily.      Marland Kitchen atorvastatin (LIPITOR) 80 MG tablet     . diclofenac sodium (VOLTAREN) 1 % GEL     . Omega-3 Fatty Acids (FISH OIL) 1000 MG CAPS Take 1,000 mg by mouth daily.    . Polyvinyl Alcohol-Povidone 5-6 MG/ML SOLN Apply 1 drop to eye 4 (four) times daily.    Marland Kitchen atorvastatin (LIPITOR) 80 MG tablet Take 1 tablet (80 mg total) by mouth daily. (Patient taking differently: Take 80 mg by mouth  at bedtime. ) 90 tablet 3   No facility-administered medications prior to visit.      Allergies:   Other and Latex   Social History   Social History  . Marital status: Widowed    Spouse name: N/A  . Number of children: N/A  . Years of education: N/A   Social History Main Topics  . Smoking status: Never Smoker  . Smokeless tobacco: Never Used  . Alcohol use No  . Drug use: No  . Sexual activity: Not Asked   Other Topics Concern  . None   Social History Narrative  . None     Family History:  The patient's family history includes Heart disease in her mother.   ROS:   Please see the history of present illness.    ROS All other systems reviewed and are negative.   PHYSICAL EXAM:   VS:  BP 138/80   Pulse 70   Ht 5\' 8"  (1.727 m)   Wt 183 lb (83 kg)   BMI 27.83 kg/m     General: Alert, oriented x3, Well nourished, well developed, in no acute distress  Head: no evidence of trauma, PERRL, EOMI, no exophtalmos or lid lag, no myxedema, no xanthelasma; normal ears, nose and oropharynx Neck: normal jugular venous pulsations and no hepatojugular reflux; brisk carotid pulses without delay and no carotid bruits Chest: clear to auscultation, no signs of consolidation by percussion or palpation, normal fremitus, symmetrical and full respiratory excursions Cardiovascular: normal position and quality of the apical impulse, regular rhythm, normal first and second heart sounds, no murmurs, rubs or gallops Abdomen: no tenderness or distention, no masses by palpation, no abnormal pulsatility or arterial bruits, normal bowel sounds, no hepatosplenomegaly Extremities: no clubbing, cyanosis or edema; 2+ radial, ulnar and brachial pulses bilaterally; 2+ right femoral, posterior tibial and dorsalis pedis pulses; 2+ left femoral, posterior tibial and dorsalis pedis pulses; no subclavian or femoral bruits Neurological: grossly nonfocal  Psych: euthymic mood, full affect  Wt Readings from Last  3 Encounters:  08/14/16 183 lb (83 kg)  04/25/16 184 lb (83.5 kg)  04/24/16 184 lb 9.6 oz (83.7 kg)      Studies/Labs Reviewed:   EKG:  EKG is ordered today.  It shows sinus rhythm with first-degree AV block (PR 234 ms), poor R-wave progression, QTC 434 ms  Labs: Lipid Panel     Component Value Date/Time   CHOL 136 04/10/2016 0857   TRIG 67 04/10/2016 0857   HDL 53 04/10/2016 0857   CHOLHDL 2.6 04/10/2016 0857   VLDL 13 04/10/2016 0857   LDLCALC 70 04/10/2016 0857   BMET    Component Value Date/Time  NA 139 02/12/2016 0424   K 3.7 02/12/2016 0424   CL 103 02/12/2016 0424   CO2 26 02/12/2016 0424   GLUCOSE 102 (H) 02/12/2016 0424   BUN 17 02/12/2016 0424   CREATININE 0.72 02/12/2016 0424   CREATININE 0.82 01/31/2016 1524   CALCIUM 8.5 (L) 02/12/2016 0424   GFRNONAA >60 02/12/2016 0424   GFRAA >60 02/12/2016 0424     ASSESSMENT:    1. CHF (congestive heart failure), NYHA class I, chronic, combined ([place])   2. Coronary artery disease involving native coronary artery of native heart without angina pectoris   3. Hypercholesterolemia   4. Essential hypertension   5. Overweight (BMI 25.0-29.9)      PLAN:  In order of problems listed above:  1. CHF: EF was 40-45 % before her revascularization. Clinically she does not have overt heart failure and appears euvolemic. Functional status limited by back pain, not by angina or breathing. EF might well have improved following right coronary artery stenting. Continue ARB and beta blocker. 2. CAD: 6 months have passed since revascularization. Will stop the aspirin and continue Plavix only to reduce the risk of bleeding while she takes NSAIDs. She doesn't think she can completely stop taking estrogen. 3. HLP: On statin lipid parameters at target 4. HTN: Fair control 5. Overweight: Has not really been able to lose any weight since her last appointment.     Medication Adjustments/Labs and Tests Ordered: Current medicines are  reviewed at length with the patient today.  Concerns regarding medicines are outlined above.  Medication changes, Labs and Tests ordered today are listed in the Patient Instructions below. Patient Instructions  Your physician has recommended you make the following change in your medication: 1. STOP Aspirin  Dr Sallyanne Kuster recommends that you schedule a follow-up appointment in 6 months. You will receive a reminder letter in the mail two months in advance. If you don't receive a letter, please call our office to schedule the follow-up appointment.  If you need a refill on your cardiac medications before your next appointment, please call your pharmacy.    Signed, Sanda Klein, MD  08/15/2016 9:05 AM    [facility] [place], [place], [place]  [address] Phone: [phone]; Fax: [phone]

## 2016-08-14 NOTE — Patient Instructions (Signed)
Your physician has recommended you make the following change in your medication: 1. STOP Aspirin  Dr Sallyanne Kuster recommends that you schedule a follow-up appointment in 6 months. You will receive a reminder letter in the mail two months in advance. If you don't receive a letter, please call our office to schedule the follow-up appointment.  If you need a refill on your cardiac medications before your next appointment, please call your pharmacy.

## 2016-08-17 DIAGNOSIS — Z6827 Body mass index (BMI) 27.0-27.9, adult: Secondary | ICD-10-CM | POA: Diagnosis not present

## 2016-08-17 DIAGNOSIS — M25511 Pain in right shoulder: Secondary | ICD-10-CM | POA: Diagnosis not present

## 2016-08-17 DIAGNOSIS — L03031 Cellulitis of right toe: Secondary | ICD-10-CM | POA: Insufficient documentation

## 2016-08-17 DIAGNOSIS — E784 Other hyperlipidemia: Secondary | ICD-10-CM | POA: Diagnosis not present

## 2016-08-17 DIAGNOSIS — Z1389 Encounter for screening for other disorder: Secondary | ICD-10-CM | POA: Diagnosis not present

## 2016-08-17 DIAGNOSIS — R2689 Other abnormalities of gait and mobility: Secondary | ICD-10-CM | POA: Diagnosis not present

## 2016-08-17 DIAGNOSIS — M25461 Effusion, right knee: Secondary | ICD-10-CM | POA: Diagnosis not present

## 2016-08-17 DIAGNOSIS — M7062 Trochanteric bursitis, left hip: Secondary | ICD-10-CM | POA: Diagnosis not present

## 2016-08-17 DIAGNOSIS — R079 Chest pain, unspecified: Secondary | ICD-10-CM | POA: Diagnosis not present

## 2016-08-17 DIAGNOSIS — I2510 Atherosclerotic heart disease of native coronary artery without angina pectoris: Secondary | ICD-10-CM | POA: Diagnosis not present

## 2016-08-21 DIAGNOSIS — R928 Other abnormal and inconclusive findings on diagnostic imaging of breast: Secondary | ICD-10-CM | POA: Diagnosis not present

## 2016-08-21 DIAGNOSIS — N6311 Unspecified lump in the right breast, upper outer quadrant: Secondary | ICD-10-CM | POA: Diagnosis not present

## 2016-08-28 ENCOUNTER — Other Ambulatory Visit: Payer: Self-pay | Admitting: Radiology

## 2016-08-28 DIAGNOSIS — C50411 Malignant neoplasm of upper-outer quadrant of right female breast: Secondary | ICD-10-CM | POA: Diagnosis not present

## 2016-08-30 ENCOUNTER — Telehealth: Payer: Self-pay | Admitting: *Deleted

## 2016-08-30 NOTE — Telephone Encounter (Signed)
Confirmed [facility] for 6.27.18 at 1215 .  Instructions and contact information given.

## 2016-08-31 ENCOUNTER — Other Ambulatory Visit: Payer: Self-pay | Admitting: *Deleted

## 2016-08-31 ENCOUNTER — Encounter: Payer: Self-pay | Admitting: *Deleted

## 2016-08-31 DIAGNOSIS — C50919 Malignant neoplasm of unspecified site of unspecified female breast: Secondary | ICD-10-CM | POA: Insufficient documentation

## 2016-08-31 DIAGNOSIS — Z170 Estrogen receptor positive status [ER+]: Secondary | ICD-10-CM

## 2016-08-31 DIAGNOSIS — C50411 Malignant neoplasm of upper-outer quadrant of right female breast: Secondary | ICD-10-CM

## 2016-08-31 DIAGNOSIS — C50419 Malignant neoplasm of upper-outer quadrant of unspecified female breast: Secondary | ICD-10-CM | POA: Insufficient documentation

## 2016-09-03 ENCOUNTER — Encounter: Payer: Self-pay | Admitting: General Surgery

## 2016-09-04 ENCOUNTER — Telehealth: Payer: Self-pay | Admitting: *Deleted

## 2016-09-04 NOTE — Telephone Encounter (Signed)
Pt called with requests her own team per her PCP recommendations. Cancel her appt for [facility]. Sent msg to care team. Called Dr. Cristal Generous office for an appt. Informed pt that she will see Dr. Donne Hazel first followed by an appt to see Dr. Jana Hakim. Received verbal understanding. Informed pt that I will call back once I have an appt for her to see Dr. Donne Hazel.

## 2016-09-06 ENCOUNTER — Ambulatory Visit: Payer: Medicare Other | Admitting: Physical Therapy

## 2016-09-06 ENCOUNTER — Ambulatory Visit: Payer: Medicare Other | Admitting: Radiation Oncology

## 2016-09-11 ENCOUNTER — Encounter: Payer: Self-pay | Admitting: Oncology

## 2016-09-11 ENCOUNTER — Telehealth: Payer: Self-pay | Admitting: Oncology

## 2016-09-11 NOTE — Telephone Encounter (Signed)
Appt scheduled for the pt to see Dr. Jana Hakim on 7/16 at 4pm. Pt aware to arrive 30 minutes early. Letter mailed.

## 2016-09-20 DIAGNOSIS — C50911 Malignant neoplasm of unspecified site of right female breast: Secondary | ICD-10-CM | POA: Diagnosis not present

## 2016-09-21 ENCOUNTER — Other Ambulatory Visit: Payer: Self-pay | Admitting: General Surgery

## 2016-09-21 DIAGNOSIS — C50911 Malignant neoplasm of unspecified site of right female breast: Secondary | ICD-10-CM

## 2016-09-22 ENCOUNTER — Telehealth: Payer: Self-pay

## 2016-09-22 ENCOUNTER — Encounter: Payer: Self-pay | Admitting: Cardiovascular Disease

## 2016-09-22 NOTE — Telephone Encounter (Signed)
1. Type of surgery: breast cancer surgery 2. Date of surgery: pending 3. Surgeon: Dr Rolm Bookbinder 4. Medications that need to be held & how long: N/A; pt takes ASA 81 mg QD 5. Fax and/or Phone: (p) [phone] (f) [phone]

## 2016-09-22 NOTE — Telephone Encounter (Signed)
epicd 

## 2016-09-25 ENCOUNTER — Ambulatory Visit (HOSPITAL_BASED_OUTPATIENT_CLINIC_OR_DEPARTMENT_OTHER): Payer: Medicare Other | Admitting: Oncology

## 2016-09-25 ENCOUNTER — Encounter: Payer: Self-pay | Admitting: *Deleted

## 2016-09-25 ENCOUNTER — Other Ambulatory Visit (HOSPITAL_BASED_OUTPATIENT_CLINIC_OR_DEPARTMENT_OTHER): Payer: Medicare Other

## 2016-09-25 VITALS — BP 121/71 | HR 72 | Temp 97.9°F | Resp 17 | Ht 68.0 in | Wt 185.6 lb

## 2016-09-25 DIAGNOSIS — Z170 Estrogen receptor positive status [ER+]: Principal | ICD-10-CM

## 2016-09-25 DIAGNOSIS — M8580 Other specified disorders of bone density and structure, unspecified site: Secondary | ICD-10-CM

## 2016-09-25 DIAGNOSIS — C50411 Malignant neoplasm of upper-outer quadrant of right female breast: Secondary | ICD-10-CM

## 2016-09-25 DIAGNOSIS — Z79811 Long term (current) use of aromatase inhibitors: Secondary | ICD-10-CM

## 2016-09-25 LAB — CBC WITH DIFFERENTIAL/PLATELET
BASO%: 0.3 % (ref 0.0–2.0)
Basophils Absolute: 0 10*3/uL (ref 0.0–0.1)
EOS%: 2.3 % (ref 0.0–7.0)
Eosinophils Absolute: 0.2 10*3/uL (ref 0.0–0.5)
HCT: 41.2 % (ref 34.8–46.6)
HGB: 13 g/dL (ref 11.6–15.9)
LYMPH%: 25.4 % (ref 14.0–49.7)
MCH: 30 pg (ref 25.1–34.0)
MCHC: 31.6 g/dL (ref 31.5–36.0)
MCV: 95.2 fL (ref 79.5–101.0)
MONO#: 0.7 10*3/uL (ref 0.1–0.9)
MONO%: 9.9 % (ref 0.0–14.0)
NEUT%: 62.1 % (ref 38.4–76.8)
NEUTROS ABS: 4.5 10*3/uL (ref 1.5–6.5)
Platelets: 173 10*3/uL (ref 145–400)
RBC: 4.33 10*6/uL (ref 3.70–5.45)
RDW: 13.8 % (ref 11.2–14.5)
WBC: 7.3 10*3/uL (ref 3.9–10.3)
lymph#: 1.9 10*3/uL (ref 0.9–3.3)

## 2016-09-25 LAB — COMPREHENSIVE METABOLIC PANEL
ALT: 13 U/L (ref 0–55)
AST: 12 U/L (ref 5–34)
Albumin: 3.7 g/dL (ref 3.5–5.0)
Alkaline Phosphatase: 95 U/L (ref 40–150)
Anion Gap: 9 mEq/L (ref 3–11)
BILIRUBIN TOTAL: 0.75 mg/dL (ref 0.20–1.20)
BUN: 18.7 mg/dL (ref 7.0–26.0)
CHLORIDE: 104 meq/L (ref 98–109)
CO2: 27 meq/L (ref 22–29)
CREATININE: 0.9 mg/dL (ref 0.6–1.1)
Calcium: 9.1 mg/dL (ref 8.4–10.4)
EGFR: 57 mL/min/{1.73_m2} — ABNORMAL LOW (ref >90)
Glucose: 132 mg/dl (ref 70–140)
Potassium: 4 mEq/L (ref 3.5–5.1)
SODIUM: 139 meq/L (ref 136–145)
TOTAL PROTEIN: 6.9 g/dL (ref 6.4–8.3)

## 2016-09-25 MED ORDER — ANASTROZOLE 1 MG PO TABS: 1.0000 mg | ORAL_TABLET | Freq: Every day | ORAL | 0 refills | Status: DC

## 2016-09-25 MED ORDER — ANASTROZOLE 1 MG PO TABS: 1.0000 mg | ORAL_TABLET | Freq: Every day | ORAL | 4 refills | Status: DC

## 2016-09-25 NOTE — Progress Notes (Signed)
[place]  [phone] [phone]     ID: SANARI OFFNER DOB: [id number]  MR#: [id number]  JYN#:[id number]  Patient Care Team: Burnard Bunting, MD as PCP - General (Internal Medicine) Leeba Barbe, Virgie Dad, MD as Consulting Physician (Oncology) Rolm Bookbinder, MD as Consulting Physician (General Surgery) Croitoru, Dani Gobble, MD as Consulting Physician (Cardiology) Chauncey Cruel, MD OTHER MD:  CHIEF COMPLAINT: Estrogen receptor positive invasive lobular breast cancer  CURRENT TREATMENT: Anastrozole, awaiting definitive surgery   BREAST CANCER HISTORY: Lucendia had a change in her right breast and was referred to [facility] where on 04/14/2016 bilateral diagnostic mammography with tomography and right breast ultrasound was obtained. The breast density was category B. In the central right breast there was an oval mass with no other findings of concern. Ultrasound located a benign 0.3 cm cyst in the upper inner quadrant of the right breast correlating with the mammography findings. Routine mammography was recommended for one year.  However with further changes in the right breast note by the patient and her primary care MD, repeat right diagnostic mammography with tomography and repeat right breast ultrasonography 08/21/2016 now found a 3 cm area of asymmetry in the upper outer right breast which on physical exam measured approximately 2-1/2 cm at the 10:00 location 8 cm from the nipple. Ultrasound of this area showed a 0.5 cm hypoechoic mass with a larger 3 cm area of hazy isoechoic tissue with abnormal architecture. The right axilla showed 2 normal-appearing lymph nodes.  Biopsy of the right breast upper outer quadrant mass 08/28/2016 showed ([id number]) an invasive lobular carcinoma, E-cadherin negative, estrogen receptor 85% positive, progesterone receptor 100% positive, both with strong staining intensity, with an MIB-1 of 5%, and no HER-2 implication, the  signals ratio being 1.49 and the number per cell 3.21.  The patient's subsequent history is as detailed below.  INTERVAL HISTORY: Monetta was evaluated in the breast clinic 09/25/2016 accompanied by her friend and power of attorney [name]. 83er case was also presented in the multidisciplinary breast cancer conference 09/20/2016. At that time a preliminary plan was proposed:  MRI for this lobular breast cancer, likely lumpectomy plus or minus sentinel lymph node sampling, consideration of neoadjuvant anti-estrogens, likely no radiation adjuvantly.  REVIEW OF SYSTEMS: Breslyn has had several falls at home, and is now using a walker in the house. That has helped. She also has a Lifeline in but she does not wear it. She keeps it by her falling. She also has a cell phone always in her pocket. When she has fallen, she calls [name] and he gets her back on her feet. She was "antiplatelet agents for her recent stents, and bruises easily, but denies overt bleeding. She has multiple episodes of nocturia regularly. She complains of arthritis "all over". She was recently started on a diuretic and potassium and finds the potassium pills hard to swallow. She tells me her lower legs were swollen, but now there are itchy and slightly red. She stopped her estrogen replacement and has started to have hot flashes. A detailed review of systems today does not suggest metastatic disease  PAST MEDICAL HISTORY: Past Medical History:  Diagnosis Date  . Arthritis   . Cancer ([place])    thyroid  . GERD (gastroesophageal reflux disease)   . GI bleed 1980   AFTER POLYP EXCISION  . Heart disease, hypertensive   . Hypercholesterolemia   . Hypertension   . Hypothyroidism   . Myalgia   . Obesity   .  Pain in joints   . Retinal detachment   . Unsteady gait   . UTI (urinary tract infection)   . Vomiting     PAST SURGICAL HISTORY: Past Surgical History:  Procedure Laterality Date  . ABDOMINAL HYSTERECTOMY  1977  . BREAST  BIOPSY    . BREAST LUMPECTOMY     x5  . CARDIAC CATHETERIZATION N/A 02/08/2016   Procedure: Left Heart Cath and Coronary Angiography;  Surgeon: Belva Crome, MD;  Location: [place] [facility];  Service: Cardiovascular;  Laterality: N/A;  . CARDIAC CATHETERIZATION N/A 02/11/2016   Procedure: Coronary Stent Intervention;  Surgeon: Belva Crome, MD;  Location: [place] [facility];  Service: Cardiovascular;  Laterality: N/A;  . CERVICAL LAMINECTOMY    . [place]  . FOOT SURGERY    . NECK SURGERY    . THYROIDECTOMY  1957  . TOTAL HIP ARTHROPLASTY  05/24/2010   left    FAMILY HISTORY Family History  Problem Relation Age of Onset  . Heart disease Mother   The patient has little information regarding her father. Her mother died at age 99 in a nursing home from what the patient thinks may have been heart disease. The patient had no brothers and no sisters.  GYNECOLOGIC HISTORY:  No LMP recorded. Patient is postmenopausal. She thinks her first menstrual period may have been age 83. She never carried a child to term. She underwent hysterectomy with bilateral salpingo-oophorectomy in her 83s. She was on estrogen replacement until July 2018  SOCIAL HISTORY:  She is a retired Glass blower/designer for [employer]. Her husband died in 05/23/2012. She lives by herself, with no pets, in a fairly large house on [place] acres she says. She does much of the house work herself. She also pays all her bills and keeps all her accounts.    ADVANCED DIRECTIVES: In place. She has named her close friend [name] as her healthcare power of attorney. He can be reached at (cell) [phone] or (home) [phone]   HEALTH MAINTENANCE: Social History  Substance Use Topics  . Smoking status: Never Smoker  . Smokeless tobacco: Never Used  . Alcohol use No     Colonoscopy: November 2011/[facility]  PAP: 2014-05-24  Bone density:At [facility] 08/30/2010 shows mild osteopenia, with the lowest T score  -0.7 at the right hip   Allergies  Allergen Reactions  . Other Other (See Comments)    SOME OF THE "MYCINS"...UNKNOWN REACTION.  Marland Kitchen Latex Rash    Current Outpatient Prescriptions  Medication Sig Dispense Refill  . Artificial Tear Ointment (ARTIFICIAL TEARS) ointment Place 1 drop into both eyes as needed.    . clopidogrel (PLAVIX) 75 MG tablet Take 1 tablet (75 mg total) by mouth daily with breakfast. 90 tablet 3  . diazepam (VALIUM) 2 MG tablet Take 1 mg by mouth 2 (two) times daily as needed for anxiety.     Marland Kitchen DICLOFENAC PO Take 100 mg by mouth daily.    . furosemide (LASIX) 40 MG tablet Take 40 mg by mouth daily.    Marland Kitchen levothyroxine (SYNTHROID, LEVOTHROID) 137 MCG tablet Take 137 mcg by mouth daily.      Marland Kitchen losartan (COZAAR) 100 MG tablet Take 100 mg by mouth daily.     . metoprolol succinate (TOPROL-XL) 100 MG 24 hr tablet Take 1 tablet (100 mg total) by mouth daily. Take with or immediately following a meal. 90 tablet 5  . pantoprazole (PROTONIX) 40 MG tablet  Take 40 mg by mouth daily.      . potassium chloride SA (K-DUR,KLOR-CON) 20 MEQ tablet Take 20 mEq by mouth 2 (two) times daily.    Marland Kitchen anastrozole (ARIMIDEX) 1 MG tablet Take 1 tablet (1 mg total) by mouth daily. 90 tablet 4  . aspirin 81 MG tablet Take 81 mg by mouth daily.      Marland Kitchen atorvastatin (LIPITOR) 80 MG tablet Take 1 tablet (80 mg total) by mouth daily. (Patient taking differently: Take 80 mg by mouth at bedtime. ) 90 tablet 3   No current facility-administered medications for this visit.     OBJECTIVE: Older white woman who appears stated age  83:   09/25/16 1547  BP: 121/71  Pulse: 72  Resp: 17  Temp: 97.9 F (36.6 C)     Body mass index is 28.22 kg/m.   Wt Readings from Last 3 Encounters:  09/25/16 185 lb 9.6 oz (84.2 kg)  08/14/16 183 lb (83 kg)  04/25/16 184 lb (83.5 kg)      ECOG FS:1 - Symptomatic but completely ambulatory  Ocular: Sclerae unicteric, pupils round and equal Ear-nose-throat:  Oropharynx clear and moist; Dentition in good repair Lymphatic: No cervical or supraclavicular adenopathy Lungs no rales or rhonchi Heart regular rate and rhythm Abd soft, nontender, positive bowel sounds MSK kyphosis but no focal spinal tenderness, minimal bilateral lower extremity edema with mild erythema Neuro: non-focal, well-oriented, appropriate affect Breasts: Both breasts are status post multiple prior benign biopsies. In the right breast in addition the most recent biopsy was in the upper outer quadrant. There is no associated ecchymosis. The mass by my palpation appears to be about  2 cm. There are no skin or nipple changes of concern. The left breast is otherwise unremarkable. Both axillae are benign.   LAB RESULTS:  CMP     Component Value Date/Time   NA 139 09/25/2016 1524   K 4.0 09/25/2016 1524   CL 103 02/12/2016 0424   CO2 27 09/25/2016 1524   GLUCOSE 132 09/25/2016 1524   BUN 18.7 09/25/2016 1524   CREATININE 0.9 09/25/2016 1524   CALCIUM 9.1 09/25/2016 1524   PROT 6.9 09/25/2016 1524   ALBUMIN 3.7 09/25/2016 1524   AST 12 09/25/2016 1524   ALT 13 09/25/2016 1524   ALKPHOS 95 09/25/2016 1524   BILITOT 0.75 09/25/2016 1524   GFRNONAA >60 02/12/2016 0424   GFRAA >60 02/12/2016 0424    No results found for: TOTALPROTELP, ALBUMINELP, A1GS, A2GS, BETS, BETA2SER, GAMS, MSPIKE, SPEI  No results found for: Nils Pyle, [facility]  Lab Results  Component Value Date   WBC 7.3 09/25/2016   NEUTROABS 4.5 09/25/2016   HGB 13.0 09/25/2016   HCT 41.2 09/25/2016   MCV 95.2 09/25/2016   PLT 173 09/25/2016      Chemistry      Component Value Date/Time   NA 139 09/25/2016 1524   K 4.0 09/25/2016 1524   CL 103 02/12/2016 0424   CO2 27 09/25/2016 1524   BUN 18.7 09/25/2016 1524   CREATININE 0.9 09/25/2016 1524      Component Value Date/Time   CALCIUM 9.1 09/25/2016 1524   ALKPHOS 95 09/25/2016 1524   AST 12 09/25/2016 1524   ALT 13 09/25/2016  1524   BILITOT 0.75 09/25/2016 1524       No results found for: LABCA2  No components found for: IEPPIR518  No results for input(s): INR in the last 168 hours.  Urinalysis  Component Value Date/Time   COLORURINE YELLOW 12/26/2012 0843   APPEARANCEUR CLOUDY (A) 12/26/2012 0843   LABSPEC 1.019 12/26/2012 0843   PHURINE 6.0 12/26/2012 0843   GLUCOSEU NEGATIVE 12/26/2012 0843   HGBUR SMALL (A) 12/26/2012 0843   BILIRUBINUR NEGATIVE 12/26/2012 0843   KETONESUR NEGATIVE 12/26/2012 0843   PROTEINUR NEGATIVE 12/26/2012 0843   UROBILINOGEN 0.2 12/26/2012 0843   NITRITE NEGATIVE 12/26/2012 0843   LEUKOCYTESUR NEGATIVE 12/26/2012 0843     STUDIES: Outside studies reviewed with the patient  ELIGIBLE FOR AVAILABLE RESEARCH PROTOCOL: No  ASSESSMENT: 83 y.o. [place], [place] woman status post right breast upper outer quadrant biopsy 08/28/2016 for a clinical T2 N0, stage IB invasive lobular carcinoma, grade not stated, estrogen and progesterone strongly positive, HER-2 nonamplified, with an MIB-1 of 5%.  (1) anastrozole started 09/25/2016  (2) definitive surgery pending  (3) consider adjuvant radiation depending on final surgical results  PLAN: We spent the better part of today's hour-long appointment discussing the biology of breast cancer in general, and the specifics of the patient's tumor in particular. We first reviewed the fact that cancer is not one disease but more than 100 different diseases and that it is important to keep them separate-- otherwise when friends and relatives discuss their own cancer experiences with Soyla confusion can result. Similarly we explained that if breast cancer spreads to the bone or liver, the patient would not have bone cancer or liver cancer, but breast cancer in the bone and breast cancer in the liver: one cancer in three places-- not 3 different cancers which otherwise would have to be treated in 3 different ways.  We discussed the difference  between local and systemic therapy. In terms of loco-regional treatment, lumpectomy plus radiation is equivalent to mastectomy as far as survival is concerned. Whether or not Giavanni will be able to under go a successful lumpectomy will depend on a breast MRI, which is currently scheduled for 10/09/2016.   We also noted that in terms of sequencing of treatments, whether systemic therapy or surgery is done first does not affect the ultimate outcome. This is discussed further below.  We then reviewed the rationale for systemic therapy. There is some risk that this cancer may have already spread to other parts of her body. Patients frequently ask at this point about bone scans, CAT scans and PET scans to find out if they have occult breast cancer somewhere else. The problem is that in early stage disease we are much more likely to find false positives then true cancers and this would expose the patient to unnecessary procedures as well as unnecessary radiation. Scans cannot answer the question the patient really would like to know, which is whether she has microscopic disease elsewhere in her body. That is the reason we are not proceeding to scanning at this point  Of course we would proceed to aggressive evaluation of any symptoms that might suggest metastatic disease, and we do obtain scans for stage III disease, but that is not the case here.  Next we went over the options for systemic therapy which are anti-estrogens, anti-HER-2 immunotherapy, and chemotherapy. Bronwen does not meet criteria for anti-HER-2 immunotherapy. She is a good candidate for anti-estrogens.  The question of chemotherapy is more complicated. Chemotherapy is most effective in rapidly growing, aggressive tumors. It is much less effective in slow growing cancers, like Jameia 's. The chemotherapy response of lobular breast cancers is also inferior to that of ductal breast cancer. For those reasons and  also taking into account Saraia's age and  comorbid conditions there is no plan to proceed to chemotherapy at this point  In short, in Terressa's case the systemic therapy question is relatively straightforward. The only real choices anti-estrogens and among the anti-estrogens choices, between tamoxifen and anastrozole, I would certainly want to avoid tamoxifen if at all possible because of clotting concerns.  Accordingly today we discussed anastrozole including possible toxicities, side effects and complications of this agent. I've gone ahead and placed the prescription in for her. This way she can start her systemic treatment while her local treatment is being planned.  She will be seeing Dr. Donne Hazel again once the results of the breast MRI are available. He will be able to best plan the local treatment from that point. I anticipate she will have her surgery early next month.  I am scheduling her to see me in about 2 months. By that time the local treatment should be completed and we should be able to tell if she is going to be able to tolerate anastrozole well enough to continue it for 5 years.  She knows to call for any problems that may develop before that visit.      Keshauna has a good understanding of the overall plan. She agrees with it. She knows the goal of treatment in her case is cure. She will call with any problems that may develop before her next visit here.  Chauncey Cruel, MD   09/25/2016 5:15 PM Medical Oncology and Hematology [facility] [address] [place], [place] [address] Tel. [phone]    Fax. [phone]

## 2016-09-26 NOTE — Telephone Encounter (Signed)
Letter faxed to Dr. Rolm Bookbinder via Epic.

## 2016-09-28 DIAGNOSIS — C50919 Malignant neoplasm of unspecified site of unspecified female breast: Secondary | ICD-10-CM | POA: Diagnosis not present

## 2016-09-28 DIAGNOSIS — I872 Venous insufficiency (chronic) (peripheral): Secondary | ICD-10-CM | POA: Diagnosis not present

## 2016-09-28 DIAGNOSIS — Z6827 Body mass index (BMI) 27.0-27.9, adult: Secondary | ICD-10-CM | POA: Diagnosis not present

## 2016-10-04 DIAGNOSIS — M85851 Other specified disorders of bone density and structure, right thigh: Secondary | ICD-10-CM | POA: Diagnosis not present

## 2016-10-04 NOTE — Therapy (Signed)
[place] [address] [place], [place], [address] Phone: [phone]   Fax:  [phone]  Patient Details  Name: Kathy Murphy MRN: [id number] Date of Birth: 1927/12/30 Referring Provider:  No ref. provider found  Encounter Date: October 17, 2016  PHYSICAL THERAPY DISCHARGE SUMMARY  Visits from Start of Care: 4  Current functional level related to goals / functional outcomes: PT Long Term Goals - 12/08/15 1636            PT LONG TERM GOAL #1   Title Pt will be IND in HEP to improve endurance, strength, and balance. TARGET DATE FOR ALL LTGS: 12/28/15   Status New       PT LONG TERM GOAL #2   Title Pt will improve BERG score to >/=46/56 to decr. falls. risk.    Baseline 41/56   Status Revised       PT LONG TERM GOAL #3   Title Pt will ambulate 500' over even/uneven terrain with LRAD at MOD I level to improve functional mobility.    Status New       PT LONG TERM GOAL #4   Title Pt will improve ABC score to >/=64% to improve confidence in balance and quality of life.    Status New       PT LONG TERM GOAL #5   Title Pt will improve 6MWT distance from 833' to 1033' to improve endurance.   Status New       Remaining deficits: Unknown, as pt had to cease therapy due to medical complications.   Education / Equipment: HEP  Plan: Patient agrees to discharge.  Patient goals were not met. Patient is being discharged due to a change in medical status.  ?????             G-Codes - Oct 17, 2016 1120    Functional Assessment Tool Used (Outpatient Only) Feet together EC: mn A, gait speed no AD : 2.13f/sec, TUG no AD: 9.7sec.-same as eval   Functional Limitation Mobility: Walking and moving around   Mobility: Walking and Moving Around Goal Status [phone] At least 1 percent but less than 20 percent impaired, limited or restricted   Mobility: Walking and Moving Around Discharge Status [phone] At  least 40 percent but less than 60 percent impaired, limited or restricted       Margert Edsall L 708-09-2016 11:20 AM  [place] [place] [address]Phone: [phone]  Fax:  [phone]  JGeoffry Paradise PT,DPT 008-07-201811:24 AM Phone: [phone]: [phone]

## 2016-10-09 ENCOUNTER — Ambulatory Visit
Admission: RE | Admit: 2016-10-09 | Discharge: 2016-10-09 | Disposition: A | Discharge status: Home / Self Care | Payer: Medicare Other | Source: Ambulatory Visit | Attending: General Surgery | Admitting: General Surgery

## 2016-10-09 DIAGNOSIS — C50411 Malignant neoplasm of upper-outer quadrant of right female breast: Secondary | ICD-10-CM | POA: Diagnosis not present

## 2016-10-09 DIAGNOSIS — C50911 Malignant neoplasm of unspecified site of right female breast: Secondary | ICD-10-CM

## 2016-10-09 MED ORDER — GADOBENATE DIMEGLUMINE 529 MG/ML IV SOLN
17.0000 mL | Freq: Once | INTRAVENOUS | Status: AC | PRN
  Administered 2016-10-09: 17 mL via INTRAVENOUS

## 2016-10-13 ENCOUNTER — Other Ambulatory Visit: Payer: Self-pay | Admitting: General Surgery

## 2016-10-13 DIAGNOSIS — C50911 Malignant neoplasm of unspecified site of right female breast: Secondary | ICD-10-CM | POA: Diagnosis not present

## 2016-10-16 ENCOUNTER — Telehealth: Payer: Self-pay | Admitting: Oncology

## 2016-10-16 NOTE — Telephone Encounter (Signed)
Scheduled appt per 8/3 message - patient is aware of appt date and time.

## 2016-10-24 ENCOUNTER — Other Ambulatory Visit: Payer: Self-pay | Admitting: Oncology

## 2016-10-30 DIAGNOSIS — R8290 Unspecified abnormal findings in urine: Secondary | ICD-10-CM | POA: Diagnosis not present

## 2016-10-30 DIAGNOSIS — E038 Other specified hypothyroidism: Secondary | ICD-10-CM | POA: Diagnosis not present

## 2016-10-30 DIAGNOSIS — I10 Essential (primary) hypertension: Secondary | ICD-10-CM | POA: Diagnosis not present

## 2016-10-30 DIAGNOSIS — E784 Other hyperlipidemia: Secondary | ICD-10-CM | POA: Diagnosis not present

## 2016-11-03 DIAGNOSIS — Z0000 Encounter for general adult medical examination without abnormal findings: Secondary | ICD-10-CM | POA: Diagnosis not present

## 2016-11-03 DIAGNOSIS — Z1389 Encounter for screening for other disorder: Secondary | ICD-10-CM | POA: Diagnosis not present

## 2016-11-03 DIAGNOSIS — I2510 Atherosclerotic heart disease of native coronary artery without angina pectoris: Secondary | ICD-10-CM | POA: Diagnosis not present

## 2016-11-03 DIAGNOSIS — K589 Irritable bowel syndrome without diarrhea: Secondary | ICD-10-CM | POA: Diagnosis not present

## 2016-11-03 DIAGNOSIS — M25461 Effusion, right knee: Secondary | ICD-10-CM | POA: Diagnosis not present

## 2016-11-03 DIAGNOSIS — E663 Overweight: Secondary | ICD-10-CM | POA: Diagnosis not present

## 2016-11-03 DIAGNOSIS — Z6827 Body mass index (BMI) 27.0-27.9, adult: Secondary | ICD-10-CM | POA: Diagnosis not present

## 2016-11-03 DIAGNOSIS — E784 Other hyperlipidemia: Secondary | ICD-10-CM | POA: Diagnosis not present

## 2016-11-03 DIAGNOSIS — Q762 Congenital spondylolisthesis: Secondary | ICD-10-CM | POA: Diagnosis not present

## 2016-11-03 DIAGNOSIS — C50919 Malignant neoplasm of unspecified site of unspecified female breast: Secondary | ICD-10-CM | POA: Diagnosis not present

## 2016-11-03 DIAGNOSIS — M25511 Pain in right shoulder: Secondary | ICD-10-CM | POA: Diagnosis not present

## 2016-11-03 DIAGNOSIS — R2689 Other abnormalities of gait and mobility: Secondary | ICD-10-CM | POA: Diagnosis not present

## 2016-11-06 DIAGNOSIS — Z1212 Encounter for screening for malignant neoplasm of rectum: Secondary | ICD-10-CM | POA: Diagnosis not present

## 2016-11-21 NOTE — Pre-Procedure Instructions (Signed)
Kathy Murphy  11/21/2016      [facility] - [place], [place] - [address] [place] [address] Phone: [phone] Fax: [phone]  [facility] [place], [place] - [address] [place] [address] [place] [address] Phone: [phone] Fax: [phone]    Your procedure is scheduled on Monday, 11/27/2016.  Report to [facility] Admitting at [place].M.  Call this number if you have problems the morning of surgery:  [phone]   Remember:  Do not eat food or drink liquids after midnight.  Continue all other medications as directed by your physician except follow these medication instructions before surgery   Take these medicines the morning of surgery with A SIP OF WATER: Levothyroxine (Synthroid) Metoprolol succinate (Toprol-XL) Pantoprazole (Protonix)  7 days prior to surgery STOP taking any Aspirin, Aleve, Naproxen, Ibuprofen, Motrin, Advil, Goody's, BC's, all herbal medications, fish oil, and all vitamins, including your diclofenac Sodium  Follow instructions regarding your plavix as directed by your physicians     Do not wear jewelry, make-up or nail polish.  Do not wear lotions, powders, or perfumes, or deoderant.  Do not shave 48 hours prior to surgery.  Men may shave face and neck.  Do not bring valuables to the hospital.  [facility] is not responsible for any belongings or valuables.  Contacts, dentures or bridgework may not be worn into surgery.  Leave your suitcase in the car.  After surgery it may be brought to your room.  For patients admitted to the hospital, discharge time will be determined by your treatment team.  Patients discharged the day of surgery will not be allowed to drive home.   Name and phone number of your driver:    Special instructions:   [place]- Preparing For Surgery  Before surgery, you can play an  important role. Because skin is not sterile, your skin needs to be as free of germs as possible. You can reduce the number of germs on your skin by washing with CHG (chlorahexidine gluconate) Soap before surgery.  CHG is an antiseptic cleaner which kills germs and bonds with the skin to continue killing germs even after washing.  Please do not use if you have an allergy to CHG or antibacterial soaps. If your skin becomes reddened/irritated stop using the CHG.  Do not shave (including legs and underarms) for at least 48 hours prior to first CHG shower. It is OK to shave your face.  Please follow these instructions carefully.   1. Shower the NIGHT BEFORE SURGERY and the MORNING OF SURGERY with CHG.   2. If you chose to wash your hair, wash your hair first as usual with your normal shampoo.  3. After you shampoo, rinse your hair and body thoroughly to remove the shampoo.  4. Use CHG as you would any other liquid soap. You can apply CHG directly to the skin and wash gently with a scrungie or a clean washcloth.   5. Apply the CHG Soap to your body ONLY FROM THE NECK DOWN.  Do not use on open wounds or open sores. Avoid contact with your eyes, ears, mouth and genitals (private parts). Wash genitals (private parts) with your normal soap.  6. Wash thoroughly, paying special attention to the area where your surgery will be performed.  7. Thoroughly rinse your body with  warm water from the neck down.  8. DO NOT shower/wash with your normal soap after using and rinsing off the CHG Soap.  9. Pat yourself dry with a CLEAN TOWEL.   10. Wear CLEAN PAJAMAS   11. Place CLEAN SHEETS on your bed the night of your first shower and DO NOT SLEEP WITH PETS.    Day of Surgery: Shower as stated above. Do not apply any deodorants/lotions.  Please wear clean clothes to the hospital/surgery center.       Please read over the following fact sheets that you were given. Pain Booklet, Coughing and Deep  Breathing, MRSA Information and Surgical Site Infection Prevention

## 2016-11-22 ENCOUNTER — Encounter (HOSPITAL_COMMUNITY)
Admission: RE | Admit: 2016-11-22 | Discharge: 2016-11-22 | Disposition: A | Discharge status: Home / Self Care | Payer: Medicare Other | Source: Ambulatory Visit | Attending: General Surgery | Admitting: General Surgery

## 2016-11-22 ENCOUNTER — Encounter (HOSPITAL_COMMUNITY): Payer: Self-pay

## 2016-11-22 DIAGNOSIS — Z01812 Encounter for preprocedural laboratory examination: Secondary | ICD-10-CM | POA: Diagnosis not present

## 2016-11-22 DIAGNOSIS — Z170 Estrogen receptor positive status [ER+]: Secondary | ICD-10-CM | POA: Insufficient documentation

## 2016-11-22 DIAGNOSIS — C50411 Malignant neoplasm of upper-outer quadrant of right female breast: Secondary | ICD-10-CM | POA: Insufficient documentation

## 2016-11-22 HISTORY — DX: Unspecified urinary incontinence: R32

## 2016-11-22 HISTORY — DX: Acute myocardial infarction, unspecified: I21.9

## 2016-11-22 LAB — BASIC METABOLIC PANEL
ANION GAP: 5 (ref 5–15)
BUN: 24 mg/dL — AB (ref 6–20)
CHLORIDE: 109 mmol/L (ref 101–111)
CO2: 25 mmol/L (ref 22–32)
Calcium: 9.1 mg/dL (ref 8.9–10.3)
Creatinine, Ser: 0.94 mg/dL (ref 0.44–1.00)
GFR calc Af Amer: >60 mL/min (ref >60)
GFR, EST NON AFRICAN AMERICAN: 53 mL/min — AB (ref >60)
Glucose, Bld: 156 mg/dL — ABNORMAL HIGH (ref 65–99)
POTASSIUM: 3.4 mmol/L — AB (ref 3.5–5.1)
Sodium: 139 mmol/L (ref 135–145)

## 2016-11-22 LAB — CBC
HEMATOCRIT: 38.3 % (ref 36.0–46.0)
HEMOGLOBIN: 12.1 g/dL (ref 12.0–15.0)
MCH: 29.8 pg (ref 26.0–34.0)
MCHC: 31.6 g/dL (ref 30.0–36.0)
MCV: 94.3 fL (ref 78.0–100.0)
PLATELETS: 171 10*3/uL (ref 150–400)
RBC: 4.06 MIL/uL (ref 3.87–5.11)
RDW: 13.3 % (ref 11.5–15.5)
WBC: 7.1 10*3/uL (ref 4.0–10.5)

## 2016-11-23 NOTE — Progress Notes (Signed)
Anesthesia Chart Review: Patient is a 83 year old female scheduled for right total mastectomy on 11/27/16 by Dr. Rolm Bookbinder.  History includes CAD/MI s/p RCA DES X 2 02/2016 (residual diagonal, ramus, distal LAD disease treated with medical therapy), hypercholesterolemia, hypothyroidism, GERD, retinal detachment, GI bleed, thyroid cancer s/p thyroidectomy '57, unsteady gait, myalgias, never smoker, cervical laminectomy, THA '12, hysterectomy '77.   - PCP is Dr. Burnard Bunting. - HEM-ONC is Dr. Lurline Del. - Cardiologist is Dr. Sanda Klein, last visit 08/14/16. He continued Plavix, but stopped her ASA at that visit (in hopes to decrease risk of GI bleed.) She is s/p RCA DES X 2 02/2016 with residual diagonal, ramus, and LAD disease that is being treated medically. Presence of widespread coronary lesions may explain the "false negative" perfusion pattern (on 12/30/15 study) but with depressed left ventricular systolic function (19%). He wrote (see Letters tab), "JONETTA DAGLEY is at low-to-moderate risk, from a cardiac standpoint, for the upcoming procedure.  Clopidogrel can be held 5-7 days prior to the procedure. Clopidogrel should be restarted once postoperative bleeding is no longer a concern. Please make sure metoprolol is not stopped during the perioperative period."  Meds include Arimidex, Lipitor, Plavix (last dose 11/22/16), levothyroxine, losartan, Toprol XL, Protonix.   BP (!) 138/49   Pulse 65   Temp 36.5 C (Oral)   Resp 20   Ht 5\' 8"  (1.727 m)   Wt 186 lb 11.7 oz (84.7 kg)   SpO2 98%   BMI 28.39 kg/m    EKG 08/14/16: SR with first degree AV block, LAD, voltage criteria for LVH.  Cardiac cath 02/08/16:  Prox LAD to Mid LAD lesion, 50 %stenosed.   Significant coronary artery disease with subtotally occluded mid right coronary collateralized from the left coronary artery (LAD and circumflex).  Severe ostial obstruction in the second diagonal, 75% stenosis in the first  diagonal, 70% stenosis within a region of tortuosity in the ramus intermedius, and 50% narrowing in the proximal to mid LAD. The distal LAD is diffusely diseased without focal obstruction.  Low normal LV function with EF estimated to be 50%.  Failed PTCA of the mid right coronary due to poor guide catheter support. RECOMMENDATION:  Begin dual antiplatelet therapy.  Re-attempt PCI of the right coronary from the femoral approach on 02/11/2016. Will discuss with CTO team as well.  Intensify anti-ischemic therapy.  Cardiac cath 02/11/16:  Mid RCA chronic total occlusion collateralized from the left coronary artery.  Successful overlapping stents from the distal RCA no the margin of the PDA back to the proximal RCA using two 38 mm long Synergy drug-eluting stents postdilated to 3.0 mm in diameter with 100% stenosis reduced to 0%. Grade 3 flow was noted.  Angio-Seal was used for successful hemostasis.  Echo 01/14/16: Study Conclusions - Left ventricle: The cavity size was mildly dilated. Wall   thickness was normal. Systolic function was mildly to moderately   reduced. The estimated ejection fraction was in the range of 40%   to 45%. Moderate diffuse hypokinesis with no identifiable   regional variations. Doppler parameters are consistent with   abnormal left ventricular relaxation (grade 1 diastolic   dysfunction). - Aortic valve: There was trivial regurgitation. - Left atrium: The atrium was mildly dilated.  Preoperative labs noted.   Patient is 9 months out from DES. Dr. Lorenza Cambridge had her stop ASA after 6 months due to GI bleed risk. She has continued Plavix, but he did give permission to temporarily hold  for breast surgery. If no acute changes then I anticipate that she can proceed as planned.  [facility] Short Stay Center/Anesthesiology Phone [phone] 11/23/2016 11:04 AM

## 2016-11-26 NOTE — Anesthesia Preprocedure Evaluation (Addendum)
Anesthesia Evaluation  Patient identified by MRN, date of birth, ID band Patient awake    Reviewed: Allergy & Precautions, NPO status , Patient's Chart, lab work & pertinent test results  Airway Mallampati: II  TM Distance: <3 FB Neck ROM: Full    Dental  (+) Dental Advisory Given   Pulmonary neg pulmonary ROS,    Pulmonary exam normal breath sounds clear to auscultation       Cardiovascular hypertension, + angina + CAD, + Past MI, + Cardiac Stents and +CHF  Normal cardiovascular exam Rhythm:Regular Rate:Normal  EKG w/ 1st deg AVB  DES placed to RCA 02/2016  LHC 01/2016 - Significant CAD with subtotally occluded mid right coronary collateralized from the left coronary artery (LAD and circumflex). Severe ostial obstruction in the second diagonal, 75% stenosis in the first diagonal, 70% stenosis within a region of tortuosity in the ramus intermedius, and 50% narrowing in the proximal to mid LAD. The distal LAD is diffusely diseased without focal obstruction. Low normal LV function with EF estimated to be 50%.  TTE 01/2016 - Left ventricle: Estimated ejection fraction was in the range of 40% to 45%. Grade 1 diastolic dysfx. - Aortic valve: There was trivial regurgitation. - Left atrium: The atrium was mildly dilated.   Neuro/Psych negative neurological ROS  negative psych ROS   GI/Hepatic Neg liver ROS, GERD  Controlled and Medicated,  Endo/Other  Hypothyroidism   Renal/GU negative Renal ROS  Female GU complaint     Musculoskeletal negative musculoskeletal ROS (+)   Abdominal   Peds  Hematology negative hematology ROS (+)   Anesthesia Other Findings Hx thyroid cancer s/p thyroidectomy  Reproductive/Obstetrics                            Anesthesia Physical Anesthesia Plan  ASA: III  Anesthesia Plan: General   Post-op Pain Management:  Regional for Post-op pain   Induction:   PONV  Risk Score and Plan: 4 or greater and Ondansetron, Propofol infusion, Treatment may vary due to age or medical condition and Dexamethasone  Airway Management Planned: LMA  Additional Equipment: None  Intra-op Plan:   Post-operative Plan: Extubation in OR  Informed Consent: I have reviewed the patients History and Physical, chart, labs and discussed the procedure including the risks, benefits and alternatives for the proposed anesthesia with the patient or authorized representative who has indicated his/her understanding and acceptance.   Dental advisory given  Plan Discussed with: CRNA  Anesthesia Plan Comments:      Anesthesia Quick Evaluation

## 2016-11-27 ENCOUNTER — Ambulatory Visit (HOSPITAL_COMMUNITY): Payer: Medicare Other | Admitting: Vascular Surgery

## 2016-11-27 ENCOUNTER — Encounter (HOSPITAL_COMMUNITY)
Admission: RE | Disposition: A | Discharge status: Home / Self Care | Payer: Self-pay | Source: Ambulatory Visit | Attending: General Surgery

## 2016-11-27 ENCOUNTER — Observation Stay (HOSPITAL_COMMUNITY)
Admission: RE | Admit: 2016-11-27 | Discharge: 2016-11-28 | Disposition: A | Discharge status: Home / Self Care | Payer: Medicare Other | Source: Ambulatory Visit | Attending: General Surgery | Admitting: General Surgery

## 2016-11-27 ENCOUNTER — Encounter (HOSPITAL_COMMUNITY): Payer: Self-pay | Admitting: Certified Registered"

## 2016-11-27 ENCOUNTER — Ambulatory Visit (HOSPITAL_COMMUNITY): Payer: Medicare Other | Admitting: Anesthesiology

## 2016-11-27 DIAGNOSIS — C50911 Malignant neoplasm of unspecified site of right female breast: Secondary | ICD-10-CM | POA: Diagnosis not present

## 2016-11-27 DIAGNOSIS — Z803 Family history of malignant neoplasm of breast: Secondary | ICD-10-CM | POA: Diagnosis not present

## 2016-11-27 DIAGNOSIS — I252 Old myocardial infarction: Secondary | ICD-10-CM | POA: Diagnosis not present

## 2016-11-27 DIAGNOSIS — Z79811 Long term (current) use of aromatase inhibitors: Secondary | ICD-10-CM | POA: Diagnosis not present

## 2016-11-27 DIAGNOSIS — Z23 Encounter for immunization: Secondary | ICD-10-CM | POA: Insufficient documentation

## 2016-11-27 DIAGNOSIS — Z791 Long term (current) use of non-steroidal anti-inflammatories (NSAID): Secondary | ICD-10-CM | POA: Diagnosis not present

## 2016-11-27 DIAGNOSIS — Z91040 Latex allergy status: Secondary | ICD-10-CM | POA: Insufficient documentation

## 2016-11-27 DIAGNOSIS — Z811 Family history of alcohol abuse and dependence: Secondary | ICD-10-CM | POA: Insufficient documentation

## 2016-11-27 DIAGNOSIS — E890 Postprocedural hypothyroidism: Secondary | ICD-10-CM | POA: Insufficient documentation

## 2016-11-27 DIAGNOSIS — I25119 Atherosclerotic heart disease of native coronary artery with unspecified angina pectoris: Secondary | ICD-10-CM | POA: Insufficient documentation

## 2016-11-27 DIAGNOSIS — Z90710 Acquired absence of both cervix and uterus: Secondary | ICD-10-CM | POA: Diagnosis not present

## 2016-11-27 DIAGNOSIS — Z79899 Other long term (current) drug therapy: Secondary | ICD-10-CM | POA: Diagnosis not present

## 2016-11-27 DIAGNOSIS — E7800 Pure hypercholesterolemia, unspecified: Secondary | ICD-10-CM | POA: Diagnosis not present

## 2016-11-27 DIAGNOSIS — Z85850 Personal history of malignant neoplasm of thyroid: Secondary | ICD-10-CM | POA: Insufficient documentation

## 2016-11-27 DIAGNOSIS — C50411 Malignant neoplasm of upper-outer quadrant of right female breast: Secondary | ICD-10-CM | POA: Diagnosis not present

## 2016-11-27 DIAGNOSIS — Z170 Estrogen receptor positive status [ER+]: Secondary | ICD-10-CM | POA: Insufficient documentation

## 2016-11-27 DIAGNOSIS — Z9842 Cataract extraction status, left eye: Secondary | ICD-10-CM | POA: Insufficient documentation

## 2016-11-27 DIAGNOSIS — M1990 Unspecified osteoarthritis, unspecified site: Secondary | ICD-10-CM | POA: Insufficient documentation

## 2016-11-27 DIAGNOSIS — C779 Secondary and unspecified malignant neoplasm of lymph node, unspecified: Secondary | ICD-10-CM | POA: Diagnosis not present

## 2016-11-27 DIAGNOSIS — Z9889 Other specified postprocedural states: Secondary | ICD-10-CM | POA: Insufficient documentation

## 2016-11-27 DIAGNOSIS — Z86010 Personal history of colonic polyps: Secondary | ICD-10-CM | POA: Insufficient documentation

## 2016-11-27 DIAGNOSIS — G8918 Other acute postprocedural pain: Secondary | ICD-10-CM | POA: Diagnosis not present

## 2016-11-27 DIAGNOSIS — I10 Essential (primary) hypertension: Secondary | ICD-10-CM | POA: Diagnosis not present

## 2016-11-27 DIAGNOSIS — I509 Heart failure, unspecified: Secondary | ICD-10-CM | POA: Insufficient documentation

## 2016-11-27 DIAGNOSIS — K219 Gastro-esophageal reflux disease without esophagitis: Secondary | ICD-10-CM | POA: Diagnosis not present

## 2016-11-27 HISTORY — DX: Malignant neoplasm of unspecified site of unspecified female breast: C50.919

## 2016-11-27 HISTORY — PX: TOTAL MASTECTOMY: SHX6129

## 2016-11-27 SURGERY — MASTECTOMY, SIMPLE
Anesthesia: Regional | Site: Breast | Laterality: Right

## 2016-11-27 MED ORDER — 0.9 % SODIUM CHLORIDE (POUR BTL) OPTIME
TOPICAL | Status: DC | PRN
  Administered 2016-11-27: 1000 mL

## 2016-11-27 MED ORDER — LOSARTAN POTASSIUM 50 MG PO TABS
100.0000 mg | ORAL_TABLET | Freq: Every day | ORAL | Status: DC
  Administered 2016-11-28: 100 mg via ORAL
  Filled 2016-11-27: qty 2

## 2016-11-27 MED ORDER — BUPIVACAINE-EPINEPHRINE (PF) 0.5% -1:200000 IJ SOLN
INTRAMUSCULAR | Status: DC | PRN
  Administered 2016-11-27: 30 mL

## 2016-11-27 MED ORDER — PANTOPRAZOLE SODIUM 40 MG PO TBEC
40.0000 mg | DELAYED_RELEASE_TABLET | Freq: Every day | ORAL | Status: DC
  Administered 2016-11-28: 40 mg via ORAL
  Filled 2016-11-27: qty 1

## 2016-11-27 MED ORDER — ONDANSETRON HCL 4 MG/2ML IJ SOLN
INTRAMUSCULAR | Status: AC
  Filled 2016-11-27: qty 2

## 2016-11-27 MED ORDER — GABAPENTIN 300 MG PO CAPS
300.0000 mg | ORAL_CAPSULE | ORAL | Status: AC
  Administered 2016-11-27: 300 mg via ORAL
  Filled 2016-11-27: qty 1

## 2016-11-27 MED ORDER — PROPOFOL 10 MG/ML IV BOLUS
INTRAVENOUS | Status: DC | PRN
  Administered 2016-11-27: 30 mg via INTRAVENOUS
  Administered 2016-11-27: 50 mg via INTRAVENOUS
  Administered 2016-11-27: 100 mg via INTRAVENOUS
  Administered 2016-11-27: 20 mg via INTRAVENOUS

## 2016-11-27 MED ORDER — METOPROLOL SUCCINATE ER 100 MG PO TB24
100.0000 mg | ORAL_TABLET | Freq: Every day | ORAL | Status: DC
  Administered 2016-11-28: 100 mg via ORAL
  Filled 2016-11-27: qty 1

## 2016-11-27 MED ORDER — METHOCARBAMOL 500 MG PO TABS: 500.0000 mg | ORAL_TABLET | Freq: Four times a day (QID) | ORAL | Status: DC | PRN

## 2016-11-27 MED ORDER — SUCCINYLCHOLINE CHLORIDE 200 MG/10ML IV SOSY
PREFILLED_SYRINGE | INTRAVENOUS | Status: AC
  Filled 2016-11-27: qty 10

## 2016-11-27 MED ORDER — DEXAMETHASONE SODIUM PHOSPHATE 10 MG/ML IJ SOLN
INTRAMUSCULAR | Status: AC
  Filled 2016-11-27: qty 1

## 2016-11-27 MED ORDER — LACTATED RINGERS IV SOLN
INTRAVENOUS | Status: DC | PRN
  Administered 2016-11-27: 07:00:00 via INTRAVENOUS

## 2016-11-27 MED ORDER — ACETAMINOPHEN 500 MG PO TABS
1000.0000 mg | ORAL_TABLET | Freq: Four times a day (QID) | ORAL | Status: DC
  Administered 2016-11-27 – 2016-11-28 (×3): 1000 mg via ORAL
  Filled 2016-11-27 (×4): qty 2

## 2016-11-27 MED ORDER — EPHEDRINE 5 MG/ML INJ
INTRAVENOUS | Status: AC
  Filled 2016-11-27: qty 10

## 2016-11-27 MED ORDER — PROPOFOL 10 MG/ML IV BOLUS
INTRAVENOUS | Status: AC
  Filled 2016-11-27: qty 20

## 2016-11-27 MED ORDER — LIDOCAINE 2% (20 MG/ML) 5 ML SYRINGE
INTRAMUSCULAR | Status: DC | PRN
  Administered 2016-11-27: 60 mg via INTRAVENOUS

## 2016-11-27 MED ORDER — PHENYLEPHRINE 40 MCG/ML (10ML) SYRINGE FOR IV PUSH (FOR BLOOD PRESSURE SUPPORT)
PREFILLED_SYRINGE | INTRAVENOUS | Status: DC | PRN
  Administered 2016-11-27: 80 ug via INTRAVENOUS

## 2016-11-27 MED ORDER — DEXTROSE 5 % IV SOLN
INTRAVENOUS | Status: DC | PRN
  Administered 2016-11-27: 40 ug/min via INTRAVENOUS

## 2016-11-27 MED ORDER — SIMETHICONE 80 MG PO CHEW: 40.0000 mg | CHEWABLE_TABLET | Freq: Four times a day (QID) | ORAL | Status: DC | PRN

## 2016-11-27 MED ORDER — FENTANYL CITRATE (PF) 100 MCG/2ML IJ SOLN
INTRAMUSCULAR | Status: DC | PRN
Start: 2016-11-27 — End: 2016-11-27
  Administered 2016-11-27: 25 ug via INTRAVENOUS
  Administered 2016-11-27: 50 ug via INTRAVENOUS
  Administered 2016-11-27: 25 ug via INTRAVENOUS
  Administered 2016-11-27 (×2): 50 ug via INTRAVENOUS

## 2016-11-27 MED ORDER — ONDANSETRON HCL 4 MG/2ML IJ SOLN: 4.0000 mg | Freq: Four times a day (QID) | INTRAMUSCULAR | Status: DC | PRN

## 2016-11-27 MED ORDER — LEVOTHYROXINE SODIUM 25 MCG PO TABS
125.0000 ug | ORAL_TABLET | Freq: Every day | ORAL | Status: DC
  Administered 2016-11-28: 125 ug via ORAL
  Filled 2016-11-27: qty 1

## 2016-11-27 MED ORDER — ONDANSETRON HCL 4 MG/2ML IJ SOLN: 4.0000 mg | Freq: Once | INTRAMUSCULAR | Status: DC | PRN

## 2016-11-27 MED ORDER — DEXAMETHASONE SODIUM PHOSPHATE 10 MG/ML IJ SOLN
INTRAMUSCULAR | Status: DC | PRN
  Administered 2016-11-27: 8 mg via INTRAVENOUS

## 2016-11-27 MED ORDER — FENTANYL CITRATE (PF) 100 MCG/2ML IJ SOLN
25.0000 ug | INTRAMUSCULAR | Status: DC | PRN
  Administered 2016-11-27: 25 ug via INTRAVENOUS

## 2016-11-27 MED ORDER — ACETAMINOPHEN 500 MG PO TABS
1000.0000 mg | ORAL_TABLET | ORAL | Status: AC
  Administered 2016-11-27: 1000 mg via ORAL
  Filled 2016-11-27: qty 2

## 2016-11-27 MED ORDER — ONDANSETRON 4 MG PO TBDP: 4.0000 mg | ORAL_TABLET | Freq: Four times a day (QID) | ORAL | Status: DC | PRN

## 2016-11-27 MED ORDER — MORPHINE SULFATE (PF) 4 MG/ML IV SOLN: 1.0000 mg | INTRAVENOUS | Status: DC | PRN

## 2016-11-27 MED ORDER — FENTANYL CITRATE (PF) 250 MCG/5ML IJ SOLN
INTRAMUSCULAR | Status: AC
  Filled 2016-11-27: qty 5

## 2016-11-27 MED ORDER — LIDOCAINE 2% (20 MG/ML) 5 ML SYRINGE
INTRAMUSCULAR | Status: AC
  Filled 2016-11-27: qty 5

## 2016-11-27 MED ORDER — SODIUM CHLORIDE 0.9 % IV SOLN: INTRAVENOUS | Status: DC

## 2016-11-27 MED ORDER — POLYETHYLENE GLYCOL 3350 17 G PO PACK: 17.0000 g | PACK | Freq: Every day | ORAL | Status: DC | PRN

## 2016-11-27 MED ORDER — ONDANSETRON HCL 4 MG/2ML IJ SOLN
INTRAMUSCULAR | Status: DC | PRN
  Administered 2016-11-27: 4 mg via INTRAVENOUS

## 2016-11-27 MED ORDER — CEFAZOLIN SODIUM-DEXTROSE 2-4 GM/100ML-% IV SOLN
2.0000 g | INTRAVENOUS | Status: AC
  Administered 2016-11-27: 2 g via INTRAVENOUS
  Filled 2016-11-27: qty 100

## 2016-11-27 MED ORDER — PHENYLEPHRINE 40 MCG/ML (10ML) SYRINGE FOR IV PUSH (FOR BLOOD PRESSURE SUPPORT)
PREFILLED_SYRINGE | INTRAVENOUS | Status: AC
  Filled 2016-11-27: qty 10

## 2016-11-27 MED ORDER — FENTANYL CITRATE (PF) 100 MCG/2ML IJ SOLN
INTRAMUSCULAR | Status: AC
  Filled 2016-11-27: qty 2

## 2016-11-27 MED ORDER — OXYCODONE HCL 5 MG PO TABS: 5.0000 mg | ORAL_TABLET | ORAL | Status: DC | PRN

## 2016-11-27 SURGICAL SUPPLY — 47 items
ADH SKN CLS APL DERMABOND .7 (GAUZE/BANDAGES/DRESSINGS) ×2
APPLIER CLIP 9.375 MED OPEN (MISCELLANEOUS)
APR CLP MED 9.3 20 MLT OPN (MISCELLANEOUS)
BINDER BREAST LRG (GAUZE/BANDAGES/DRESSINGS) IMPLANT
BINDER BREAST XLRG (GAUZE/BANDAGES/DRESSINGS) ×2 IMPLANT
BIOPATCH RED 1 DISK 7.0 (GAUZE/BANDAGES/DRESSINGS) ×2 IMPLANT
BIOPATCH RED 1IN DISK 7.0MM (GAUZE/BANDAGES/DRESSINGS) ×1
CHLORAPREP W/TINT 26ML (MISCELLANEOUS) ×3 IMPLANT
CLIP APPLIE 9.375 MED OPEN (MISCELLANEOUS) IMPLANT
CLOSURE WOUND 1/2 X4 (GAUZE/BANDAGES/DRESSINGS) ×1
COVER SURGICAL LIGHT HANDLE (MISCELLANEOUS) ×3 IMPLANT
DERMABOND ADVANCED (GAUZE/BANDAGES/DRESSINGS) ×4
DERMABOND ADVANCED .7 DNX12 (GAUZE/BANDAGES/DRESSINGS) ×1 IMPLANT
DRAIN CHANNEL 19F RND (DRAIN) ×3 IMPLANT
DRAPE CHEST BREAST 15X10 FENES (DRAPES) ×3 IMPLANT
DRSG PAD ABDOMINAL 8X10 ST (GAUZE/BANDAGES/DRESSINGS) ×4 IMPLANT
DRSG TEGADERM 4X4.75 (GAUZE/BANDAGES/DRESSINGS) ×3 IMPLANT
ELECT BLADE 4.0 EZ CLEAN MEGAD (MISCELLANEOUS)
ELECT CAUTERY BLADE 6.4 (BLADE) ×3 IMPLANT
ELECT REM PT RETURN 9FT ADLT (ELECTROSURGICAL) ×3
ELECTRODE BLDE 4.0 EZ CLN MEGD (MISCELLANEOUS) ×1 IMPLANT
ELECTRODE REM PT RTRN 9FT ADLT (ELECTROSURGICAL) ×1 IMPLANT
EVACUATOR SILICONE 100CC (DRAIN) ×3 IMPLANT
GAUZE SPONGE 4X4 12PLY STRL LF (GAUZE/BANDAGES/DRESSINGS) ×2 IMPLANT
GLOVE BIO SURGEON STRL SZ7 (GLOVE) ×1 IMPLANT
GLOVE BIOGEL PI IND STRL 6.5 (GLOVE) IMPLANT
GLOVE BIOGEL PI IND STRL 7.5 (GLOVE) ×1 IMPLANT
GLOVE BIOGEL PI INDICATOR 6.5 (GLOVE) ×2
GLOVE BIOGEL PI INDICATOR 7.5 (GLOVE) ×2
GLOVE SURG SS PI 6.0 STRL IVOR (GLOVE) ×2 IMPLANT
GLOVE SURG SS PI 6.5 STRL IVOR (GLOVE) ×2 IMPLANT
GLOVE SURG SS PI 7.0 STRL IVOR (GLOVE) ×2 IMPLANT
GOWN STRL REUS W/ TWL LRG LVL3 (GOWN DISPOSABLE) ×3 IMPLANT
GOWN STRL REUS W/TWL LRG LVL3 (GOWN DISPOSABLE) ×9
KIT BASIN OR (CUSTOM PROCEDURE TRAY) ×3 IMPLANT
KIT ROOM TURNOVER OR (KITS) ×3 IMPLANT
NS IRRIG 1000ML POUR BTL (IV SOLUTION) ×3 IMPLANT
PACK GENERAL/GYN (CUSTOM PROCEDURE TRAY) ×3 IMPLANT
PAD ARMBOARD 7.5X6 YLW CONV (MISCELLANEOUS) ×3 IMPLANT
SPECIMEN JAR LARGE (MISCELLANEOUS) IMPLANT
SPECIMEN JAR X LARGE (MISCELLANEOUS) ×2 IMPLANT
STRIP CLOSURE SKIN 1/2X4 (GAUZE/BANDAGES/DRESSINGS) ×2 IMPLANT
SUT ETHILON 2 0 FS 18 (SUTURE) ×3 IMPLANT
SUT MNCRL AB 4-0 PS2 18 (SUTURE) ×3 IMPLANT
SUT VIC AB 3-0 SH 18 (SUTURE) ×5 IMPLANT
TOWEL OR 17X24 6PK STRL BLUE (TOWEL DISPOSABLE) ×3 IMPLANT
TOWEL OR 17X26 10 PK STRL BLUE (TOWEL DISPOSABLE) ×1 IMPLANT

## 2016-11-27 NOTE — Progress Notes (Signed)
Dr. Fransisco Beau made aware of pt's nasal congestion, dry cough, and runny nose with clear drainage since yesterday. Pt denies fever or chest pain. Upon assessment, no fever, negative for wheezing or SOB.

## 2016-11-27 NOTE — Anesthesia Procedure Notes (Signed)
Anesthesia Regional Block: Pectoralis block   Pre-Anesthetic Checklist: ,, timeout performed, Correct Patient, Correct Site, Correct Laterality, Correct Procedure, Correct Position, site marked, Risks and benefits discussed,  Surgical consent,  Pre-op evaluation,  At surgeon's request and post-op pain management  Laterality: Right  Prep: chloraprep       Needles:  Injection technique: Single-shot  Needle Type: Echogenic Needle     Needle Length: 9cm  Needle Gauge: 21     Additional Needles:   Narrative:  Start time: 11/27/2016 7:06 AM End time: 11/27/2016 7:11 AM Injection made incrementally with aspirations every 5 mL.  Performed by: Personally  Anesthesiologist: Renold Don E  Additional Notes: No pain on injection. No increased resistance to injection. Injection made in 5cc increments. Good needle visualization. Patient tolerated the procedure well.

## 2016-11-27 NOTE — Anesthesia Procedure Notes (Signed)
Procedure Name: LMA Insertion Date/Time: 11/27/2016 7:34 AM Performed by: Orlie Dakin Pre-anesthesia Checklist: Patient identified, Emergency Drugs available, Suction available, Patient being monitored and Timeout performed Patient Re-evaluated:Patient Re-evaluated prior to induction Oxygen Delivery Method: Circle system utilized Preoxygenation: Pre-oxygenation with 100% oxygen Induction Type: IV induction Ventilation: Mask ventilation without difficulty LMA: LMA inserted LMA Size: 4.0 Tube type: Oral Number of attempts: 2 Placement Confirmation: positive ETCO2 Tube secured with: Tape Dental Injury: Teeth and Oropharynx as per pre-operative assessment

## 2016-11-27 NOTE — Interval H&P Note (Signed)
History and Physical Interval Note:  11/27/2016 7:11 AM  Kathy Murphy  has presented today for surgery, with the diagnosis of RIGHT BREAST CANCER  The various methods of treatment have been discussed with the patient and family. After consideration of risks, benefits and other options for treatment, the patient has consented to  Procedure(s): RIGHT TOTAL MASTECTOMY (Right) as a surgical intervention .  The patient's history has been reviewed, patient examined, no change in status, stable for surgery.  I have reviewed the patient's chart and labs.  Questions were answered to the patient's satisfaction.     Juanita Streight

## 2016-11-27 NOTE — Addendum Note (Signed)
Addendum  created 11/27/16 1109 by Orlie Dakin, CRNA   Sign clinical note

## 2016-11-27 NOTE — Transfer of Care (Addendum)
Immediate Anesthesia Transfer of Care Note  Patient: Kathy Murphy  Procedure(s) Performed: Procedure(s): RIGHT TOTAL MASTECTOMY (Right)  Patient Location: PACU  Anesthesia Type:General and Regional  Level of Consciousness: unresponsive and drowsy  Airway & Oxygen Therapy: Patient Spontanous Breathing and Patient connected to face mask oxygen  Post-op Assessment: Report given to RN and Post -op Vital signs reviewed and stable  Post vital signs: Reviewed and stable  Last Vitals:  Vitals:   11/27/16 0618  BP: 108/75  Pulse: 71  Resp: 16  Temp: 36.8 C  SpO2: 95%    Last Pain:  Vitals:   11/27/16 0618  TempSrc: Oral      Patients Stated Pain Goal: 3 (16/10/96 0454)  Complications: No apparent anesthesia complications

## 2016-11-27 NOTE — Op Note (Signed)
Preoperative diagnosis: clinical stage II right breast cancer Postoperative diagnosis: same as above Procedure: Right total mastectomy Surgeon: Dr Serita Grammes    Anesthesia: general with pectoral block Complications none Drains 19 Fr Blake drain Specimens: right mastectomy long lateral, short superior Sponge and needle count correct at completion dispo to recovery stable.  Indications: This is a 83 year old female with a clinical stage II right breast cancer. We discussed options and decided to proceed with a right total mastectomy.  Oncology did not feel we needed sentinel node biopsy and she has no abnormal nodes preop on imaging or exam.  Procedure: After informed consent was obtained the patient was taken to the operating room. She underwent a pectoral block. She was placed under general anesthesia without complication. She was prepped and draped in the standard sterile surgical fashion. A surgical timeout was then performed.  I made a large elliptical incision encompassing the nipple areolar complex. I then carried flaps to the clavicle, parasternal area, inframammary crease, and latissimus laterally. I then removed the breast and the overlying skin and nipple/areola including the pectoralis fascia from the pectoralis muscle.  then marked this as above and passed off the table as a specimen. I obtained hemostasis. I inserted a 86 [place] Blake drain and secured this with a 2-0 nylon suture. I then closed the skin with multiple 3-0 interrupted Vicryls. I used 4 Monocryl to close the skin I then used Dermabond and Steri-Strips. She tolerated and was transferred to recovery stable.

## 2016-11-27 NOTE — H&P (Signed)
36 yof referred by Dr Reynaldo Minium for new right breast cancer. she noted palpable mass about a month ago. no nipple dc. no family history. she underwent mm/us that shows b density breasts. there is a 3 cm asymmetry in the ruoq 8 cm from nipple on mm on [place] there is a small 5 mm oval hypoechoic mass within a larger 3 cm area of tissue. nodes are normal on axillary [place]. biopsy was done with clip placement that shows a grade I ILC that is er/pr pos, her2 negative, Ki is 5%. she has now undergone an mri thta shows ill defined area of NME in upper outer right breast measuring 4.5x3x3.4 cm in size. the left breast is normal. nodes are all normal. she has seen dr Jana Hakim who is agreeable to omit sn and has started her on anastrozole. she has beach trip planned for around labor day with her family and her son is out of town some this month.  Past Surgical History  Breast Biopsy  Right. Cataract Surgery  Left. Colon Polyp Removal - Colonoscopy  Foot Surgery  Right. Hip Surgery  Left. Hysterectomy (not due to cancer) - Complete  Oral Surgery  Spinal Surgery - Neck  Thyroid Surgery   Diagnostic Studies History  Mammogram  within last year  Allergies  Latex   Medication History  Atorvastatin Calcium (80MG Tablet, Oral) Active. Furosemide (40MG Tablet, Oral) Active. Potassium Chloride Crys ER (20MEQ Tablet ER, Oral) Active. Artificial Tear Ointment (Ophthalmic) Active. Diclofenac Sodium ER (100MG Tablet ER 24HR, Oral) Active. DiazePAM (2MG Tablet, Oral) Active. Levothyroxine Sodium (137MCG Tablet, Oral) Active. Pantoprazole Sodium (40MG Tablet DR, Oral) Active. Premarin (0.9MG Tablet, Oral) Active. Metoprolol Succinate (100MG Tablet ER, Oral) Active. Clopidogrel Bisulfate (75MG Tablet, Oral) Active. Medications Reconciled  Social History  Caffeine use  Coffee. No alcohol use  No drug use  Tobacco use  Never smoker.  Family History Alcohol Abuse  Daughter,  Son. Breast Cancer  Father.  Pregnancy / Birth History Age of menopause  [age] Para  0  Other Problems  Arthritis  Back Pain  Bladder Problems  Breast Cancer  Diverticulosis  Gastroesophageal Reflux Disease  High blood pressure  Hypercholesterolemia  Thyroid Cancer  Thyroid Disease    Review of Systems  General Not Present- Appetite Loss, Chills, Fatigue, Fever, Night Sweats, Weight Gain and Weight Loss. Skin Not Present- Change in Wart/Mole, Dryness, Hives, Jaundice, New Lesions, Non-Healing Wounds, Rash and Ulcer. Respiratory Not Present- Bloody sputum, Chronic Cough, Difficulty Breathing, Snoring and Wheezing. Breast Present- Breast Mass. Not Present- Breast Pain, Nipple Discharge and Skin Changes. Cardiovascular Present- Swelling of Extremities. Not Present- Chest Pain, Difficulty Breathing Lying Down, Leg Cramps, Palpitations, Rapid Heart Rate and Shortness of Breath. Gastrointestinal Not Present- Abdominal Pain, Bloating, Bloody Stool, Change in Bowel Habits, Chronic diarrhea, Constipation, Difficulty Swallowing, Excessive gas, Gets full quickly at meals, Hemorrhoids, Indigestion, Nausea, Rectal Pain and Vomiting. Female Genitourinary Present- Frequency and Nocturia. Not Present- Painful Urination, Pelvic Pain and Urgency. Musculoskeletal Present- Back Pain and Swelling of Extremities. Not Present- Joint Pain, Joint Stiffness, Muscle Pain and Muscle Weakness. Neurological Present- Trouble walking. Not Present- Decreased Memory, Fainting, Headaches, Numbness, Seizures, Tingling, Tremor and Weakness. Psychiatric Not Present- Anxiety, Bipolar, Change in Sleep Pattern, Depression, Fearful and Frequent crying. Endocrine Present- Heat Intolerance. Not Present- Cold Intolerance, Excessive Hunger, Hair Changes, Hot flashes and New Diabetes. Hematology Present- Easy Bruising. Not Present- Blood Thinners, Excessive bleeding, Gland problems, HIV and Persistent  Infections.  Vitals  Weight: 188  lb Height: 68in Body Surface Area: 1.99 m Body Mass Index: 28.59 kg/m  Temp.: 97.35F  Pulse: 74 (Regular)  BP: 180/80 (Sitting, Left Arm, Standard Physical Exam General Mental Status-Alert. Orientation-Oriented X3. Head and Neck Thyroid -Note: no thyroid, incision healed. Eye Sclera/Conjunctiva - Bilateral-No scleral icterus. Chest and Lung Exam Chest and lung exam reveals -quiet, even and easy respiratory effort with no use of accessory muscles and on auscultation, normal breath sounds, no adventitious sounds and normal vocal resonance. Breast Nipples-No Discharge. Note: mulitple well healed incisions bilaterally 2-3 cm ruoq mass mobile nontender Lymphatic Head & Neck General Head & Neck Lymphatics: Bilateral - Description - Normal. Axillary General Axillary Region: Bilateral - Description - Normal. Note: no [place] adenopathy HEENT: trachea midline, no cervical adenopathy Eyes: perrl CV: regular rate, regular rhythm palpable ue and carotid pulses ab soft, nontender nondistened skin: warm, dry Neuro: alert oriented times three psych, nl thought, judgment  Assessment & Plan  BREAST CARCINOMA, LOBULAR, RIGHT (C50.911) Story: Right total mastectomy I had discussion with she and her son today about mri, lobular cancer and options. we could pursue additional biopsies and consider lumpectomy although I think that the likelihood of positive margins, additional surgery likely mastectomy are high. I think that removal of this area with lumpectomy is not ideal either for her cancer or quality of life. I think mastectomy will effectively treat this. she does not desire any reconstruction. I will omit sn after discussion with Dr Jana Hakim. we discussed mastectomy in detail. we discussed drain placement and duration, risks including infection, hematoma, pain. also we discused appearance, pt postop. discussed recovery, restrictions and  need for help postop. we also discussed that any further treatment will be based on final path results. she is on anastrozole and due to upcoming family trips I think fine to schedule her after labro day.

## 2016-11-27 NOTE — Anesthesia Postprocedure Evaluation (Signed)
Anesthesia Post Note  Patient: Kathy Murphy  Procedure(s) Performed: Procedure(s) (LRB): RIGHT TOTAL MASTECTOMY (Right)     Patient location during evaluation: PACU Anesthesia Type: General Level of consciousness: awake and alert Pain management: pain level controlled Vital Signs Assessment: post-procedure vital signs reviewed and stable Respiratory status: spontaneous breathing, nonlabored ventilation, respiratory function stable and patient connected to nasal cannula oxygen Cardiovascular status: blood pressure returned to baseline and stable Postop Assessment: no apparent nausea or vomiting Anesthetic complications: no    Last Vitals:  Vitals:   11/27/16 0915 11/27/16 0924  BP:  (!) 144/55  Pulse: 63 63  Resp: 16 19  Temp:    SpO2: 97% 100%    Last Pain:  Vitals:   11/27/16 0924  TempSrc:   PainSc: Kathy Murphy

## 2016-11-28 ENCOUNTER — Encounter (HOSPITAL_COMMUNITY): Payer: Self-pay | Admitting: General Surgery

## 2016-11-28 DIAGNOSIS — E890 Postprocedural hypothyroidism: Secondary | ICD-10-CM | POA: Diagnosis not present

## 2016-11-28 DIAGNOSIS — E7800 Pure hypercholesterolemia, unspecified: Secondary | ICD-10-CM | POA: Diagnosis not present

## 2016-11-28 DIAGNOSIS — C50911 Malignant neoplasm of unspecified site of right female breast: Secondary | ICD-10-CM | POA: Diagnosis not present

## 2016-11-28 DIAGNOSIS — Z9889 Other specified postprocedural states: Secondary | ICD-10-CM | POA: Diagnosis not present

## 2016-11-28 DIAGNOSIS — Z170 Estrogen receptor positive status [ER+]: Secondary | ICD-10-CM | POA: Diagnosis not present

## 2016-11-28 DIAGNOSIS — K219 Gastro-esophageal reflux disease without esophagitis: Secondary | ICD-10-CM | POA: Diagnosis not present

## 2016-11-28 MED ORDER — INFLUENZA VAC SPLIT HIGH-DOSE 0.5 ML IM SUSY
0.5000 mL | PREFILLED_SYRINGE | Freq: Once | INTRAMUSCULAR | Status: AC
  Administered 2016-11-28: 0.5 mL via INTRAMUSCULAR
  Filled 2016-11-28: qty 0.5

## 2016-11-28 MED ORDER — INFLUENZA VAC SPLIT HIGH-DOSE 0.5 ML IM SUSY: 0.5000 mL | PREFILLED_SYRINGE | INTRAMUSCULAR | Status: DC

## 2016-11-28 NOTE — Progress Notes (Signed)
Discharge home. Home discharge instruction given, no question verbalized. 

## 2016-11-28 NOTE — Discharge Summary (Signed)
Physician Discharge Summary  Patient ID: JANEI SCHEFF MRN: [id number] DOB/AGE: [birth year] 83 y.o.  Admit date: 11/27/2016 Discharge date: 11/28/2016  Admission Diagnoses: History MI GERD Breast cancer  Discharge Diagnoses:  Active Problems:   Breast cancer, right [facility])   Discharged Condition: good  Hospital Course: 50 yof underwent right total mastectomy for right breast cancer. Doing well following am with appropriate drain output.  Consults: None  Significant Diagnostic Studies: none  Treatments: surgery: right total mastectomy  Discharge Exam: Blood pressure (!) 146/55, pulse 66, temperature 98.2 F (36.8 C), temperature source Oral, resp. rate 16, height 5\' 8"  (1.727 m), weight 84.4 kg (186 lb), SpO2 100 %. Incision/Wound:flaps viable, drain serosang, no hematoma  Disposition: 01-Home or Self Care   Allergies as of 11/28/2016      Reactions   Other Other (See Comments)   SOME OF THE "MYCINS"...UNKNOWN REACTION.   Latex Rash      Medication List    STOP taking these medications   anastrozole 1 MG tablet Commonly known as:  ARIMIDEX     TAKE these medications   artificial tears ointment Place 1 drop into both eyes 3 (three) times daily as needed (for dry eyes).   atorvastatin 80 MG tablet Commonly known as:  LIPITOR Take 1 tablet (80 mg total) by mouth daily.   clopidogrel 75 MG tablet Commonly known as:  PLAVIX Take 1 tablet (75 mg total) by mouth daily with breakfast.   Diclofenac Sodium CR 100 MG 24 hr tablet Take 100 mg by mouth daily.   levothyroxine 125 MCG tablet Commonly known as:  SYNTHROID, LEVOTHROID Take 125 mcg by mouth daily before breakfast.   losartan 100 MG tablet Commonly known as:  COZAAR Take 100 mg by mouth daily.   metoprolol succinate 100 MG 24 hr tablet Commonly known as:  TOPROL-XL Take 1 tablet (100 mg total) by mouth daily. Take with or immediately following a meal.   pantoprazole 40 MG tablet Commonly known as:   PROTONIX Take 40 mg by mouth daily.      Follow-up Information    Rolm Bookbinder, MD Follow up in 1 week(s).   Specialty:  General Surgery Contact information: [place]   [id number] [phone]           Signed: Rolm Bookbinder 11/28/2016, 1:58 PM

## 2016-11-28 NOTE — Discharge Instructions (Signed)
[facility] [place] °[phone] ° °MASTECTOMY: POST OP INSTRUCTIONS ° °Always review your discharge instruction sheet given to you by the facility where your surgery was performed. °IF YOU HAVE DISABILITY OR FAMILY LEAVE FORMS, YOU MUST BRING THEM TO THE OFFICE FOR PROCESSING.   °DO NOT GIVE THEM TO YOUR DOCTOR. °A prescription for pain medication may be given to you upon discharge.  Take your pain medication as prescribed, if needed.  If narcotic pain medicine is not needed, then you may take acetaminophen (Tylenol), naprosyn (Alleve) or ibuprofen (Advil) as needed. °1. Take your usually prescribed medications unless otherwise directed. °2. If you need a refill on your pain medication, please contact your pharmacy.  They will contact our office to request authorization.  Prescriptions will not be filled after 5pm or on week-ends. °3. You should follow a light diet the first few days after arrival home, such as soup and crackers, etc.  Resume your normal diet the day after surgery. °4. Most patients will experience some swelling and bruising on the chest and underarm.  Ice packs will help.  Swelling and bruising can take several days to resolve. Wear the binder day and night until you return to the office.  °5. It is common to experience some constipation if taking pain medication after surgery.  Increasing fluid intake and taking a stool softener (such as Colace) will usually help or prevent this problem from occurring.  A mild laxative (Milk of Magnesia or Miralax) should be taken according to package instructions if there are no bowel movements after 48 hours. °6. Unless discharge instructions indicate otherwise, leave your bandage dry and in place until your next appointment in 3-5 days.  You may take a limited sponge bath.  No tube baths or showers until the drains are removed.  You may have steri-strips (small skin tapes) in place directly over the incision.  These strips should be left on the  skin for 7-10 days. If you have glue it will come off in next couple week.  Any sutures will be removed at an office visit °7. DRAINS:  If you have drains in place, it is important to keep a list of the amount of drainage produced each day in your drains.  Before leaving the hospital, you should be instructed on drain care.  Call our office if you have any questions about your drains. I will remove your drains when they put out less than 30 cc or ml for 2 consecutive days. °8. ACTIVITIES:  You may resume regular (light) daily activities beginning the next day--such as daily self-care, walking, climbing stairs--gradually increasing activities as tolerated.  You may have sexual intercourse when it is comfortable.  Refrain from any heavy lifting or straining until approved by your doctor. °a. You may drive when you are no longer taking prescription pain medication, you can comfortably wear a seatbelt, and you can safely maneuver your car and apply brakes. °b. RETURN TO WORK:  __________________________________________________________ °9. You should see your doctor in the office for a follow-up appointment approximately 3-5 days after your surgery.  Your doctor’s nurse will typically make your follow-up appointment when she calls you with your pathology report.  Expect your pathology report 3-4business days after surgery. °10. OTHER INSTRUCTIONS: ______________________________________________________________________________________________ ____________________________________________________________________________________________ °WHEN TO CALL YOUR DR Kenley Rettinger: °1. Fever over 101.0 °2. Nausea and/or vomiting °3. Extreme swelling or bruising °4. Continued bleeding from incision. °5. Increased pain, redness, or drainage from the incision. °The clinic staff is available   to answer your questions during regular business hours.  Please don’t hesitate to call and ask to speak to one of the nurses for clinical concerns.  If  you have a medical emergency, go to the nearest emergency room or call 911.  A surgeon from [facility] is always on call at the hospital. °[address], [address], [place], [place]  [address] ? [address] [address], [place], [place]   [address] °[phone] ? [phone] ? FAX [phone] °Web site: [email or web address] ° °

## 2016-12-18 ENCOUNTER — Ambulatory Visit (HOSPITAL_BASED_OUTPATIENT_CLINIC_OR_DEPARTMENT_OTHER): Payer: Medicare Other | Admitting: Oncology

## 2016-12-18 ENCOUNTER — Telehealth: Payer: Self-pay | Admitting: Oncology

## 2016-12-18 ENCOUNTER — Other Ambulatory Visit: Payer: Self-pay | Admitting: *Deleted

## 2016-12-18 VITALS — BP 133/59 | HR 70 | Temp 98.1°F | Resp 18 | Ht 68.0 in | Wt 182.7 lb

## 2016-12-18 DIAGNOSIS — C50411 Malignant neoplasm of upper-outer quadrant of right female breast: Secondary | ICD-10-CM | POA: Diagnosis not present

## 2016-12-18 DIAGNOSIS — Z170 Estrogen receptor positive status [ER+]: Secondary | ICD-10-CM | POA: Diagnosis not present

## 2016-12-18 DIAGNOSIS — I2510 Atherosclerotic heart disease of native coronary artery without angina pectoris: Secondary | ICD-10-CM | POA: Diagnosis not present

## 2016-12-18 MED ORDER — GABAPENTIN 100 MG PO CAPS: 100.0000 mg | ORAL_CAPSULE | Freq: Every day | ORAL | 6 refills | Status: DC

## 2016-12-18 MED ORDER — ANASTROZOLE 1 MG PO TABS: 1.0000 mg | ORAL_TABLET | Freq: Every day | ORAL | 4 refills | Status: DC

## 2016-12-18 NOTE — Telephone Encounter (Signed)
Scheduled appts per 10/8 los. Gave patient avs and calendar.

## 2016-12-18 NOTE — Progress Notes (Signed)
[facility]  Telephone:(336) [phone] Fax:(336) [phone]     ID: Kathy Murphy DOB: 20-Dec-1927  MR#: [id number]  NGE#:[id number]  Patient Care Team: Burnard Bunting, MD as PCP - General (Internal Medicine) Emryn Flanery, Virgie Dad, MD as Consulting Physician (Oncology) Rolm Bookbinder, MD as Consulting Physician (General Surgery) Croitoru, Dani Gobble, MD as Consulting Physician (Cardiology) Chauncey Cruel, MD OTHER MD:  CHIEF COMPLAINT: Estrogen receptor positive invasive lobular breast cancer  CURRENT TREATMENT: Anastrozole   BREAST CANCER HISTORY: From the original intake note:  Kathy Murphy had a change in her right breast and was referred to [facility] where on 04/14/2016 bilateral diagnostic mammography with tomography and right breast ultrasound was obtained. The breast density was category B. In the central right breast there was an oval mass with no other findings of concern. Ultrasound located a benign 0.3 cm cyst in the upper inner quadrant of the right breast correlating with the mammography findings. Routine mammography was recommended for one year.  However with further changes in the right breast note by the patient and her primary care MD, repeat right diagnostic mammography with tomography and repeat right breast ultrasonography 08/21/2016 now found a 3 cm area of asymmetry in the upper outer right breast which on physical exam measured approximately 2-1/2 cm at the 10:00 location 8 cm from the nipple. Ultrasound of this area showed a 0.5 cm hypoechoic mass with a larger 3 cm area of hazy isoechoic tissue with abnormal architecture. The right axilla showed 2 normal-appearing lymph nodes.  Biopsy of the right breast upper outer quadrant mass 08/28/2016 showed ([id number] [id number]) an invasive lobular carcinoma, E-cadherin negative, estrogen receptor 85% positive, progesterone receptor 100% positive, both with strong staining intensity, with an MIB-1 of 5%, and no HER-2 implication, the  signals ratio being 1.49 and the number per cell 3.21.  The patient's subsequent history is as detailed below.  INTERVAL HISTORY: Kathy Murphy returns today for follow-up of her estrogen receptor positive breast cancer. Since her last visit here she underwent a right simple mastectomy, on 11/27/2016. This showed (SZA [phone]) and invasive lobular carcinoma, grade 1, measuring 3.0 cm. Fortunately all resection margins were negative.  She notes that her right mastectomy went well and she had no complications. Pt states that her pain following mastectomy was managed with tylenol. Pt notes that she still has a drain in place to the surgical site that is still draining. Pt reports that she is keeping a log of the amount of drainage that comes out as well.    She was taken off the anastrozole for some reason at the time of surgery, but despite that she still having significant hot flashes. They can occur any time. They do make her sweats. Aside from that she has tolerated the medication well.  REVIEW OF SYSTEMS: Kathy Murphy denies unusual headaches, visual changes, nausea, vomiting, or dizziness. There has been no unusual cough, phlegm production, or pleurisy. This been no change in bowel or bladder habits. She denies unexplained fatigue or unexplained weight loss, bleeding, rash, or fever. Her biggest problem of course is at the drains still in place and still draining more than 30 mL a day. A detailed review of systems was otherwise negative.    PAST MEDICAL HISTORY: Past Medical History:  Diagnosis Date  . Arthritis   . Breast cancer ([place])    Right  . Cancer ([place])    thyroid  . GERD (gastroesophageal reflux disease)   . GI bleed 1980   AFTER POLYP EXCISION  .  Heart disease, hypertensive   . Hypercholesterolemia   . Hypertension   . Hypothyroidism   . Incontinence of urine   . Myalgia   . Myocardial infarction ([place])   . Obesity   . Pain in joints   . Retinal detachment   . Unsteady gait   . UTI  (urinary tract infection)   . Vomiting     PAST SURGICAL HISTORY: Past Surgical History:  Procedure Laterality Date  . ABDOMINAL HYSTERECTOMY  1977  . BREAST BIOPSY    . BREAST LUMPECTOMY     x5  . CARDIAC CATHETERIZATION N/A 02/08/2016   Procedure: Left Heart Cath and Coronary Angiography;  Surgeon: Belva Crome, MD;  Location: [facility];  Service: Cardiovascular;  Laterality: N/A;  . CARDIAC CATHETERIZATION N/A 02/11/2016   Procedure: Coronary Stent Intervention;  Surgeon: Belva Crome, MD;  Location: [facility];  Service: Cardiovascular;  Laterality: N/A;  . CATARACT EXTRACTION, BILATERAL    . CERVICAL LAMINECTOMY    . [place]  . FOOT SURGERY    . NECK SURGERY    . THYROIDECTOMY  1957  . TOTAL HIP ARTHROPLASTY  05/01/2010   left  . TOTAL MASTECTOMY Right 11/27/2016  . TOTAL MASTECTOMY Right 11/27/2016   Procedure: RIGHT TOTAL MASTECTOMY;  Surgeon: Rolm Bookbinder, MD;  Location: [facility];  Service: General;  Laterality: Right;    FAMILY HISTORY Family History  Problem Relation Age of Onset  . Heart disease Mother   The patient has little information regarding her father. Her mother died at age 19 in a nursing home from what the patient thinks may have been heart disease. The patient had no brothers and no sisters.  GYNECOLOGIC HISTORY:  No LMP recorded. Patient is postmenopausal. She thinks her first menstrual period may have been age 38. She never carried a child to term. She underwent hysterectomy with bilateral salpingo-oophorectomy in her 57s. She was on estrogen replacement until July 2018  SOCIAL HISTORY:  She is a retired Glass blower/designer for [employer]. Her husband died in 01-May-2012. She lives by herself, with no pets, in a fairly large house on [place] acres she says. She does much of the house work herself. She also pays all her bills and keeps all her accounts.    ADVANCED DIRECTIVES: In place. She has named her close friend Kathy Murphy as  her healthcare power of attorney. He can be reached at (cell) [phone] or (home) [phone]   HEALTH MAINTENANCE: Social History  Substance Use Topics  . Smoking status: Never Smoker  . Smokeless tobacco: Never Used  . Alcohol use No     Colonoscopy: November 2011/[facility]  PAP: 2014-05-01  Bone density:At [facility] 08/30/2010 shows mild osteopenia, with the lowest T score -0.7 at the right hip   Allergies  Allergen Reactions  . Other Other (See Comments)    SOME OF THE "MYCINS"...UNKNOWN REACTION.  Marland Kitchen Latex Rash    Current Outpatient Prescriptions  Medication Sig Dispense Refill  . Artificial Tear Ointment (ARTIFICIAL TEARS) ointment Place 1 drop into both eyes 3 (three) times daily as needed (for dry eyes).     . clopidogrel (PLAVIX) 75 MG tablet Take 1 tablet (75 mg total) by mouth daily with breakfast. 90 tablet 3  . Diclofenac Sodium CR 100 MG 24 hr tablet Take 100 mg by mouth daily.    Marland Kitchen levothyroxine (SYNTHROID, LEVOTHROID) 125 MCG tablet Take 125 mcg by mouth daily before breakfast.    .  losartan (COZAAR) 100 MG tablet Take 100 mg by mouth daily.     . metoprolol succinate (TOPROL-XL) 100 MG 24 hr tablet Take 1 tablet (100 mg total) by mouth daily. Take with or immediately following a meal. 90 tablet 5  . pantoprazole (PROTONIX) 40 MG tablet Take 40 mg by mouth daily.      . rosuvastatin (CRESTOR) 10 MG tablet TK 1 T PO QD  6   No current facility-administered medications for this visit.     OBJECTIVE: Older white woman In no acute distress  Vitals:   12/18/16 0954  BP: (!) 133/59  Pulse: 70  Resp: 18  Temp: 98.1 F (36.7 C)  SpO2: 99%     Body mass index is 27.78 kg/m.   Wt Readings from Last 3 Encounters:  12/18/16 182 lb 11.2 oz (82.9 kg)  11/27/16 186 lb (84.4 kg)  11/22/16 186 lb 11.7 oz (84.7 kg)      ECOG FS:1 - Symptomatic but completely ambulatory  Sclerae unicteric, EOMs intact Oropharynx clear and moist No cervical or  supraclavicular adenopathy Lungs no rales or rhonchi Heart regular rate and rhythm Abd soft, nontender, positive bowel sounds MSK no focal spinal tenderness, no upper extremity lymphedema Neuro: nonfocal, well oriented, appropriate affect Breasts: The right breast is status post mastectomy. The incision appears to be healing very nicely, with no dehiscence, erythema, or swelling. The drain is still in place and there is approximately 15 mL of serous fluid in the bulb. The left breast is unremarkable. Both axillae are benign.    LAB RESULTS:  CMP     Component Value Date/Time   NA 139 11/22/2016 1345   NA 139 09/25/2016 1524   K 3.4 (L) 11/22/2016 1345   K 4.0 09/25/2016 1524   CL 109 11/22/2016 1345   CO2 25 11/22/2016 1345   CO2 27 09/25/2016 1524   GLUCOSE 156 (H) 11/22/2016 1345   GLUCOSE 132 09/25/2016 1524   BUN 24 (H) 11/22/2016 1345   BUN 18.7 09/25/2016 1524   CREATININE 0.94 11/22/2016 1345   CREATININE 0.9 09/25/2016 1524   CALCIUM 9.1 11/22/2016 1345   CALCIUM 9.1 09/25/2016 1524   PROT 6.9 09/25/2016 1524   ALBUMIN 3.7 09/25/2016 1524   AST 12 09/25/2016 1524   ALT 13 09/25/2016 1524   ALKPHOS 95 09/25/2016 1524   BILITOT 0.75 09/25/2016 1524   GFRNONAA 53 (L) 11/22/2016 1345   GFRAA >60 11/22/2016 1345    No results found for: TOTALPROTELP, ALBUMINELP, A1GS, A2GS, BETS, BETA2SER, GAMS, MSPIKE, SPEI  No results found for: Nils Pyle, [facility]  Lab Results  Component Value Date   WBC 7.1 11/22/2016   NEUTROABS 4.5 09/25/2016   HGB 12.1 11/22/2016   HCT 38.3 11/22/2016   MCV 94.3 11/22/2016   PLT 171 11/22/2016      Chemistry      Component Value Date/Time   NA 139 11/22/2016 1345   NA 139 09/25/2016 1524   K 3.4 (L) 11/22/2016 1345   K 4.0 09/25/2016 1524   CL 109 11/22/2016 1345   CO2 25 11/22/2016 1345   CO2 27 09/25/2016 1524   BUN 24 (H) 11/22/2016 1345   BUN 18.7 09/25/2016 1524   CREATININE 0.94 11/22/2016 1345    CREATININE 0.9 09/25/2016 1524      Component Value Date/Time   CALCIUM 9.1 11/22/2016 1345   CALCIUM 9.1 09/25/2016 1524   ALKPHOS 95 09/25/2016 1524   AST 12 09/25/2016 1524  ALT 13 09/25/2016 1524   BILITOT 0.75 09/25/2016 1524       No results found for: LABCA2  No components found for: EPPIRJ188  No results for input(s): INR in the last 168 hours.  Urinalysis    Component Value Date/Time   COLORURINE YELLOW 12/26/2012 0843   APPEARANCEUR CLOUDY (A) 12/26/2012 0843   LABSPEC 1.019 12/26/2012 0843   PHURINE 6.0 12/26/2012 0843   GLUCOSEU NEGATIVE 12/26/2012 0843   HGBUR SMALL (A) 12/26/2012 0843   BILIRUBINUR NEGATIVE 12/26/2012 0843   KETONESUR NEGATIVE 12/26/2012 0843   PROTEINUR NEGATIVE 12/26/2012 0843   UROBILINOGEN 0.2 12/26/2012 0843   NITRITE NEGATIVE 12/26/2012 0843   LEUKOCYTESUR NEGATIVE 12/26/2012 0843     STUDIES: No results found.   ELIGIBLE FOR AVAILABLE RESEARCH PROTOCOL: No  ASSESSMENT: 83 y.o. [place], [place] woman status post right breast upper outer quadrant biopsy 08/28/2016 for a clinical T2 N0, stage IB invasive lobular carcinoma, grade not stated, estrogen and progesterone strongly positive, HER-2 nonamplified, with an MIB-1 of 5%.  (1) anastrozole started 09/25/2016  (2) Status post right mastectomy without sentinel lymph node sampling 11/27/2016 for a pT2 cN0 invasive lobular carcinoma, grade 1, with negative margins.  (3) no adjuvant radiation indicated  PLAN: Please did very well with her surgery, and will not need adjuvant radiation.  She is going back on anastrozole. She tolerated that well previously except for problems with hot flashes. Of course she still has hot flashes even though she is off that medication.  We are going to try gabapentin and 100 mg at bedtime to see if that helps with at nighttime hot flashes. If it does not we will up the dose slowly, given her age.  We will consider adding Effexor after the next  visit if the hot flashes continue to be a problem.  She is not ready of course to use up her stasis since she still has her drain in place but I went ahead and wrote her a prescription in any case without once she is done with the local healing issues she can explore that possibility.  We went over her prognosis which is generally quite good. The plan is to continue anastrozole for total of 5 years  She knows to call for any other issues that may develop before the next visit.  Chauncey Cruel, MD 12/18/16 10:08 AM  Medical Oncology and Hematology [facility] [address] [place], [place] [address] Tel. [phone] Fax. [phone]  This document serves as a record of services personally performed by Lurline Del, MD. It was created on her behalf by Steva Colder, a trained medical scribe. The creation of this record is based on the scribe's personal observations and the provider's statements to them. This document has been checked and approved by the attending provider.

## 2017-01-08 ENCOUNTER — Inpatient Hospital Stay (HOSPITAL_COMMUNITY)
Admission: AD | Admit: 2017-01-08 | Discharge: 2017-01-11 | DRG: 921 | Disposition: A | Discharge status: Home Health | Payer: Medicare Other | Source: Ambulatory Visit | Attending: General Surgery | Admitting: General Surgery

## 2017-01-08 ENCOUNTER — Ambulatory Visit: Payer: Self-pay | Admitting: General Surgery

## 2017-01-08 ENCOUNTER — Other Ambulatory Visit: Payer: Self-pay | Admitting: General Surgery

## 2017-01-08 DIAGNOSIS — L7633 Postprocedural seroma of skin and subcutaneous tissue following a dermatologic procedure: Secondary | ICD-10-CM | POA: Diagnosis not present

## 2017-01-08 DIAGNOSIS — Z7902 Long term (current) use of antithrombotics/antiplatelets: Secondary | ICD-10-CM | POA: Diagnosis not present

## 2017-01-08 DIAGNOSIS — E7800 Pure hypercholesterolemia, unspecified: Secondary | ICD-10-CM | POA: Diagnosis present

## 2017-01-08 DIAGNOSIS — Y838 Other surgical procedures as the cause of abnormal reaction of the patient, or of later complication, without mention of misadventure at the time of the procedure: Secondary | ICD-10-CM | POA: Diagnosis present

## 2017-01-08 DIAGNOSIS — Z955 Presence of coronary angioplasty implant and graft: Secondary | ICD-10-CM

## 2017-01-08 DIAGNOSIS — N6489 Other specified disorders of breast: Secondary | ICD-10-CM | POA: Diagnosis present

## 2017-01-08 DIAGNOSIS — C50911 Malignant neoplasm of unspecified site of right female breast: Secondary | ICD-10-CM | POA: Diagnosis present

## 2017-01-08 DIAGNOSIS — Y828 Other medical devices associated with adverse incidents: Secondary | ICD-10-CM | POA: Diagnosis present

## 2017-01-08 DIAGNOSIS — I10 Essential (primary) hypertension: Secondary | ICD-10-CM | POA: Diagnosis present

## 2017-01-08 DIAGNOSIS — I509 Heart failure, unspecified: Secondary | ICD-10-CM | POA: Diagnosis not present

## 2017-01-08 DIAGNOSIS — I2510 Atherosclerotic heart disease of native coronary artery without angina pectoris: Secondary | ICD-10-CM | POA: Diagnosis present

## 2017-01-08 DIAGNOSIS — L7632 Postprocedural hematoma of skin and subcutaneous tissue following other procedure: Principal | ICD-10-CM | POA: Diagnosis present

## 2017-01-08 DIAGNOSIS — E039 Hypothyroidism, unspecified: Secondary | ICD-10-CM | POA: Diagnosis present

## 2017-01-08 DIAGNOSIS — I252 Old myocardial infarction: Secondary | ICD-10-CM | POA: Diagnosis not present

## 2017-01-08 DIAGNOSIS — K219 Gastro-esophageal reflux disease without esophagitis: Secondary | ICD-10-CM | POA: Diagnosis present

## 2017-01-08 LAB — CBC
HEMATOCRIT: 37.3 % (ref 36.0–46.0)
HEMOGLOBIN: 11.9 g/dL — AB (ref 12.0–15.0)
MCH: 29.5 pg (ref 26.0–34.0)
MCHC: 31.9 g/dL (ref 30.0–36.0)
MCV: 92.6 fL (ref 78.0–100.0)
Platelets: 188 10*3/uL (ref 150–400)
RBC: 4.03 MIL/uL (ref 3.87–5.11)
RDW: 13.4 % (ref 11.5–15.5)
WBC: 8.6 10*3/uL (ref 4.0–10.5)

## 2017-01-08 LAB — BASIC METABOLIC PANEL
Anion gap: 9 (ref 5–15)
BUN: 20 mg/dL (ref 6–20)
CALCIUM: 9 mg/dL (ref 8.9–10.3)
CO2: 25 mmol/L (ref 22–32)
Chloride: 102 mmol/L (ref 101–111)
Creatinine, Ser: 1.03 mg/dL — ABNORMAL HIGH (ref 0.44–1.00)
GFR calc Af Amer: 55 mL/min — ABNORMAL LOW (ref >60)
GFR, EST NON AFRICAN AMERICAN: 47 mL/min — AB (ref >60)
GLUCOSE: 168 mg/dL — AB (ref 65–99)
POTASSIUM: 3.4 mmol/L — AB (ref 3.5–5.1)
Sodium: 136 mmol/L (ref 135–145)

## 2017-01-08 MED ORDER — POTASSIUM CHLORIDE 20 MEQ/15ML (10%) PO SOLN
40.0000 meq | Freq: Once | ORAL | Status: AC
  Administered 2017-01-08: 40 meq via ORAL
  Filled 2017-01-08: qty 30

## 2017-01-08 MED ORDER — ONDANSETRON HCL 4 MG/2ML IJ SOLN
4.0000 mg | Freq: Four times a day (QID) | INTRAMUSCULAR | Status: DC | PRN
  Administered 2017-01-09: 4 mg via INTRAVENOUS

## 2017-01-08 MED ORDER — MORPHINE SULFATE (PF) 4 MG/ML IV SOLN: 1.0000 mg | INTRAVENOUS | Status: DC | PRN

## 2017-01-08 MED ORDER — METHOCARBAMOL 500 MG PO TABS: 500.0000 mg | ORAL_TABLET | Freq: Four times a day (QID) | ORAL | Status: DC | PRN

## 2017-01-08 MED ORDER — ACETAMINOPHEN 325 MG PO TABS
650.0000 mg | ORAL_TABLET | Freq: Four times a day (QID) | ORAL | Status: DC | PRN
  Administered 2017-01-09 – 2017-01-11 (×3): 650 mg via ORAL
  Filled 2017-01-08 (×3): qty 2

## 2017-01-08 MED ORDER — METOPROLOL SUCCINATE ER 100 MG PO TB24
100.0000 mg | ORAL_TABLET | Freq: Every day | ORAL | Status: DC
  Administered 2017-01-09 – 2017-01-11 (×3): 100 mg via ORAL
  Filled 2017-01-08 (×3): qty 1

## 2017-01-08 MED ORDER — ACETAMINOPHEN 650 MG RE SUPP: 650.0000 mg | Freq: Four times a day (QID) | RECTAL | Status: DC | PRN

## 2017-01-08 MED ORDER — SODIUM CHLORIDE 0.9 % IV SOLN
INTRAVENOUS | Status: DC
Start: 2017-01-09 — End: 2017-01-09
  Administered 2017-01-08: 21:00:00 via INTRAVENOUS

## 2017-01-08 MED ORDER — ONDANSETRON 4 MG PO TBDP: 4.0000 mg | ORAL_TABLET | Freq: Four times a day (QID) | ORAL | Status: DC | PRN

## 2017-01-08 NOTE — H&P (Signed)
Kathy Murphy is an 83 y.o. female.   Chief Complaint: seroma after right mastectomy HPI: 46 s/p right mastectomy for 3 cm grade I ILC with 3 nodes that were in breast/low axilla (1/3 micromet).  Er/pr pos, her 2 negative. Ki is 5%.  She had drain in for prolonged period of time and appeared to be going down. I removed and then drained seroma 4 days later. She returned three days later with significant increase and drainage from old drain site. She has pain at site.    Past Medical History:  Diagnosis Date  . Arthritis   . Breast cancer ([place])    Right  . Cancer ([place])    thyroid  . GERD (gastroesophageal reflux disease)   . GI bleed 1980   AFTER POLYP EXCISION  . Heart disease, hypertensive   . Hypercholesterolemia   . Hypertension   . Hypothyroidism   . Incontinence of urine   . Myalgia   . Myocardial infarction ([place])   . Obesity   . Pain in joints   . Retinal detachment   . Unsteady gait   . UTI (urinary tract infection)   . Vomiting     Past Surgical History:  Procedure Laterality Date  . ABDOMINAL HYSTERECTOMY  1977  . BREAST BIOPSY    . BREAST LUMPECTOMY     x5  . CARDIAC CATHETERIZATION N/A 02/08/2016   Procedure: Left Heart Cath and Coronary Angiography;  Surgeon: Belva Crome, MD;  Location: [facility];  Service: Cardiovascular;  Laterality: N/A;  . CARDIAC CATHETERIZATION N/A 02/11/2016   Procedure: Coronary Stent Intervention;  Surgeon: Belva Crome, MD;  Location: [facility];  Service: Cardiovascular;  Laterality: N/A;  . CATARACT EXTRACTION, BILATERAL    . CERVICAL LAMINECTOMY    . [place]  . FOOT SURGERY    . NECK SURGERY    . THYROIDECTOMY  1957  . TOTAL HIP ARTHROPLASTY  2012   left  . TOTAL MASTECTOMY Right 11/27/2016  . TOTAL MASTECTOMY Right 11/27/2016   Procedure: RIGHT TOTAL MASTECTOMY;  Surgeon: Rolm Bookbinder, MD;  Location: [facility];  Service: General;  Laterality: Right;    Family History  Problem Relation Age  of Onset  . Heart disease Mother    Social History:  reports that she has never smoked. She has never used smokeless tobacco. She reports that she does not drink alcohol or use drugs.  Allergies:  Allergies  Allergen Reactions  . Other Other (See Comments)    SOME OF THE "MYCINS"...UNKNOWN REACTION.  Marland Kitchen Latex Rash     (Not in a hospital admission)  No results found for this or any previous visit (from the past 48 hour(s)). No results found.  Review of Systems  Constitutional: Positive for malaise/fatigue.  Cardiovascular: Positive for chest pain (site of mastectomy with drainage).  Neurological: Positive for weakness.  All other systems reviewed and are negative.   There were no vitals taken for this visit. Physical Exam  Vitals reviewed. Constitutional: She appears well-developed and well-nourished.  Cardiovascular: Normal rate, regular rhythm and normal heart sounds.   Respiratory: Effort normal and breath sounds normal.  Right mastectomy seroma/hematoma with drainage from jp sites no infection      Assessment/Plan Post mastectomy seroma/hematoma  She had prolonged drain and seroma. I think has subsequent hematoma from aspiration last week. I drained 90 cc today but I think this just needs to be evacuated in or and new  drains placed. I will admit her today due to pain/drainage and add on for tomorrow  Rolm Bookbinder, MD 01/08/2017, 4:51 PM

## 2017-01-08 NOTE — H&P (Deleted)
  The note originally documented on this encounter has been moved the the encounter in which it belongs.  

## 2017-01-09 ENCOUNTER — Ambulatory Visit (HOSPITAL_COMMUNITY): Admission: RE | Admit: 2017-01-09 | Payer: Medicare Other | Source: Ambulatory Visit | Admitting: General Surgery

## 2017-01-09 ENCOUNTER — Encounter (HOSPITAL_COMMUNITY)
Admission: AD | Disposition: A | Discharge status: Home Health | Payer: Self-pay | Source: Ambulatory Visit | Attending: General Surgery

## 2017-01-09 ENCOUNTER — Encounter (HOSPITAL_COMMUNITY): Payer: Self-pay | Admitting: Certified Registered"

## 2017-01-09 ENCOUNTER — Inpatient Hospital Stay (HOSPITAL_COMMUNITY): Payer: Medicare Other | Admitting: Anesthesiology

## 2017-01-09 HISTORY — PX: EVACUATION BREAST HEMATOMA: SHX1537

## 2017-01-09 LAB — SURGICAL PCR SCREEN
MRSA, PCR: NEGATIVE
Staphylococcus aureus: POSITIVE — AB

## 2017-01-09 SURGERY — EVACUATION, HEMATOMA, BREAST
Anesthesia: General | Site: Breast | Laterality: Right

## 2017-01-09 MED ORDER — DEXAMETHASONE SODIUM PHOSPHATE 10 MG/ML IJ SOLN
INTRAMUSCULAR | Status: DC | PRN
  Administered 2017-01-09: 10 mg via INTRAVENOUS

## 2017-01-09 MED ORDER — FENTANYL CITRATE (PF) 100 MCG/2ML IJ SOLN
INTRAMUSCULAR | Status: DC | PRN
Start: 2017-01-09 — End: 2017-01-09
  Administered 2017-01-09: 25 ug via INTRAVENOUS
  Administered 2017-01-09: 50 ug via INTRAVENOUS
  Administered 2017-01-09: 25 ug via INTRAVENOUS

## 2017-01-09 MED ORDER — GABAPENTIN 100 MG PO CAPS
100.0000 mg | ORAL_CAPSULE | Freq: Every day | ORAL | Status: DC
  Administered 2017-01-10: 100 mg via ORAL
  Filled 2017-01-09 (×2): qty 1

## 2017-01-09 MED ORDER — CHLORHEXIDINE GLUCONATE CLOTH 2 % EX PADS
6.0000 | MEDICATED_PAD | Freq: Every day | CUTANEOUS | Status: DC
  Administered 2017-01-09 – 2017-01-10 (×2): 6 via TOPICAL

## 2017-01-09 MED ORDER — MUPIROCIN 2 % EX OINT
1.0000 "application " | TOPICAL_OINTMENT | Freq: Two times a day (BID) | CUTANEOUS | Status: DC
  Administered 2017-01-09 – 2017-01-11 (×5): 1 via NASAL
  Filled 2017-01-09 (×2): qty 22

## 2017-01-09 MED ORDER — SODIUM CHLORIDE 0.9 % IV SOLN: INTRAVENOUS | Status: DC

## 2017-01-09 MED ORDER — PROPOFOL 10 MG/ML IV BOLUS
INTRAVENOUS | Status: DC | PRN
  Administered 2017-01-09: 50 mg via INTRAVENOUS
  Administered 2017-01-09: 150 mg via INTRAVENOUS

## 2017-01-09 MED ORDER — ONDANSETRON HCL 4 MG/2ML IJ SOLN: 4.0000 mg | Freq: Four times a day (QID) | INTRAMUSCULAR | Status: DC | PRN

## 2017-01-09 MED ORDER — PHENYLEPHRINE HCL 10 MG/ML IJ SOLN
INTRAMUSCULAR | Status: DC | PRN
  Administered 2017-01-09: 40 ug via INTRAVENOUS

## 2017-01-09 MED ORDER — FENTANYL CITRATE (PF) 100 MCG/2ML IJ SOLN
INTRAMUSCULAR | Status: AC
  Filled 2017-01-09: qty 2

## 2017-01-09 MED ORDER — LIDOCAINE 2% (20 MG/ML) 5 ML SYRINGE
INTRAMUSCULAR | Status: DC | PRN
  Administered 2017-01-09: 60 mg via INTRAVENOUS

## 2017-01-09 MED ORDER — LEVOTHYROXINE SODIUM 25 MCG PO TABS
125.0000 ug | ORAL_TABLET | Freq: Every day | ORAL | Status: DC
  Administered 2017-01-10 – 2017-01-11 (×2): 125 ug via ORAL
  Filled 2017-01-09 (×2): qty 1

## 2017-01-09 MED ORDER — LACTATED RINGERS IV SOLN
INTRAVENOUS | Status: DC
  Administered 2017-01-09: 13:00:00 via INTRAVENOUS

## 2017-01-09 MED ORDER — OXYCODONE HCL 5 MG PO TABS: 5.0000 mg | ORAL_TABLET | Freq: Once | ORAL | Status: DC | PRN

## 2017-01-09 MED ORDER — HEMOSTATIC AGENTS (NO CHARGE) OPTIME
TOPICAL | Status: DC | PRN
  Administered 2017-01-09 (×2): 1 via TOPICAL

## 2017-01-09 MED ORDER — PANTOPRAZOLE SODIUM 40 MG PO TBEC
40.0000 mg | DELAYED_RELEASE_TABLET | Freq: Every day | ORAL | Status: DC
  Administered 2017-01-10 – 2017-01-11 (×2): 40 mg via ORAL
  Filled 2017-01-09 (×2): qty 1

## 2017-01-09 MED ORDER — BUPIVACAINE-EPINEPHRINE 0.25% -1:200000 IJ SOLN
INTRAMUSCULAR | Status: DC | PRN
  Administered 2017-01-09: 30 mL

## 2017-01-09 MED ORDER — FENTANYL CITRATE (PF) 100 MCG/2ML IJ SOLN
25.0000 ug | INTRAMUSCULAR | Status: DC | PRN
  Administered 2017-01-09 (×2): 25 ug via INTRAVENOUS

## 2017-01-09 MED ORDER — EPHEDRINE SULFATE 50 MG/ML IJ SOLN
INTRAMUSCULAR | Status: DC | PRN
  Administered 2017-01-09 (×3): 10 mg via INTRAVENOUS

## 2017-01-09 MED ORDER — LOSARTAN POTASSIUM 50 MG PO TABS
100.0000 mg | ORAL_TABLET | Freq: Every day | ORAL | Status: DC
  Administered 2017-01-10 – 2017-01-11 (×2): 100 mg via ORAL
  Filled 2017-01-09 (×2): qty 2

## 2017-01-09 MED ORDER — CEFAZOLIN SODIUM-DEXTROSE 2-4 GM/100ML-% IV SOLN
2.0000 g | Freq: Once | INTRAVENOUS | Status: AC
  Administered 2017-01-09: 2 g via INTRAVENOUS
  Filled 2017-01-09 (×2): qty 100

## 2017-01-09 MED ORDER — 0.9 % SODIUM CHLORIDE (POUR BTL) OPTIME
TOPICAL | Status: DC | PRN
  Administered 2017-01-09 (×2): 1000 mL

## 2017-01-09 MED ORDER — ROSUVASTATIN CALCIUM 10 MG PO TABS
10.0000 mg | ORAL_TABLET | ORAL | Status: DC
  Administered 2017-01-10 – 2017-01-11 (×2): 10 mg via ORAL
  Filled 2017-01-09 (×2): qty 1

## 2017-01-09 MED ORDER — OXYCODONE HCL 5 MG/5ML PO SOLN: 5.0000 mg | Freq: Once | ORAL | Status: DC | PRN

## 2017-01-09 MED ORDER — BUPIVACAINE-EPINEPHRINE (PF) 0.25% -1:200000 IJ SOLN
INTRAMUSCULAR | Status: AC
  Filled 2017-01-09: qty 30

## 2017-01-09 MED ORDER — ANASTROZOLE 1 MG PO TABS
1.0000 mg | ORAL_TABLET | Freq: Every day | ORAL | Status: DC
  Administered 2017-01-10 – 2017-01-11 (×2): 1 mg via ORAL
  Filled 2017-01-09 (×2): qty 1

## 2017-01-09 SURGICAL SUPPLY — 57 items
ADH SKN CLS APL DERMABOND .7 (GAUZE/BANDAGES/DRESSINGS) ×1
APL SKNCLS STERI-STRIP NONHPOA (GAUZE/BANDAGES/DRESSINGS) ×1
APPLIER CLIP 9.375 MED OPEN (MISCELLANEOUS) ×3
APR CLP MED 9.3 20 MLT OPN (MISCELLANEOUS) ×1
BENZOIN TINCTURE PRP APPL 2/3 (GAUZE/BANDAGES/DRESSINGS) ×3 IMPLANT
BINDER BREAST LRG (GAUZE/BANDAGES/DRESSINGS) IMPLANT
BINDER BREAST XLRG (GAUZE/BANDAGES/DRESSINGS) ×2 IMPLANT
BIOPATCH RED 1 DISK 7.0 (GAUZE/BANDAGES/DRESSINGS) ×2 IMPLANT
BIOPATCH RED 1IN DISK 7.0MM (GAUZE/BANDAGES/DRESSINGS) ×2
CANISTER SUCT 3000ML PPV (MISCELLANEOUS) ×2 IMPLANT
CHLORAPREP W/TINT 26ML (MISCELLANEOUS) ×3 IMPLANT
CLIP APPLIE 9.375 MED OPEN (MISCELLANEOUS) IMPLANT
CLOSURE WOUND 1/2 X4 (GAUZE/BANDAGES/DRESSINGS) ×1
COVER SURGICAL LIGHT HANDLE (MISCELLANEOUS) ×3 IMPLANT
DECANTER SPIKE VIAL GLASS SM (MISCELLANEOUS) ×3 IMPLANT
DERMABOND ADVANCED (GAUZE/BANDAGES/DRESSINGS) ×2
DERMABOND ADVANCED .7 DNX12 (GAUZE/BANDAGES/DRESSINGS) ×1 IMPLANT
DRAIN CHANNEL 19F RND (DRAIN) ×4 IMPLANT
DRAPE CHEST BREAST 15X10 FENES (DRAPES) ×3 IMPLANT
DRAPE UTILITY XL STRL (DRAPES) ×2 IMPLANT
DRSG TEGADERM 4X4.75 (GAUZE/BANDAGES/DRESSINGS) ×2 IMPLANT
ELECT CAUTERY BLADE 6.4 (BLADE) ×3 IMPLANT
ELECT REM PT RETURN 9FT ADLT (ELECTROSURGICAL) ×3
ELECTRODE REM PT RTRN 9FT ADLT (ELECTROSURGICAL) ×1 IMPLANT
EVACUATOR SILICONE 100CC (DRAIN) ×4 IMPLANT
GLOVE BIO SURGEON STRL SZ7 (GLOVE) ×3 IMPLANT
GLOVE BIOGEL PI IND STRL 7.5 (GLOVE) ×1 IMPLANT
GLOVE BIOGEL PI INDICATOR 7.5 (GLOVE) ×2
GLOVE ECLIPSE 8.0 STRL XLNG CF (GLOVE) ×2 IMPLANT
GOWN STRL REUS W/ TWL LRG LVL3 (GOWN DISPOSABLE) ×2 IMPLANT
GOWN STRL REUS W/TWL LRG LVL3 (GOWN DISPOSABLE) ×6
HEMOSTAT ARISTA ABSORB 3G PWDR (MISCELLANEOUS) ×4 IMPLANT
KIT BASIN OR (CUSTOM PROCEDURE TRAY) ×3 IMPLANT
KIT ROOM TURNOVER OR (KITS) ×3 IMPLANT
NDL HYPO 25GX1X1/2 BEV (NEEDLE) ×1 IMPLANT
NEEDLE HYPO 25GX1X1/2 BEV (NEEDLE) ×3 IMPLANT
NS IRRIG 1000ML POUR BTL (IV SOLUTION) ×3 IMPLANT
PACK SURGICAL SETUP 50X90 (CUSTOM PROCEDURE TRAY) ×3 IMPLANT
PAD ABD 8X10 STRL (GAUZE/BANDAGES/DRESSINGS) ×2 IMPLANT
PAD ARMBOARD 7.5X6 YLW CONV (MISCELLANEOUS) ×3 IMPLANT
PENCIL BUTTON HOLSTER BLD 10FT (ELECTRODE) ×3 IMPLANT
SPONGE LAP 18X18 X RAY DECT (DISPOSABLE) ×5 IMPLANT
STRIP CLOSURE SKIN 1/2X4 (GAUZE/BANDAGES/DRESSINGS) ×1 IMPLANT
SUT ETHILON 2 0 FS 18 (SUTURE) ×4 IMPLANT
SUT ETHILON 3 0 PS 1 (SUTURE) ×4 IMPLANT
SUT MNCRL AB 4-0 PS2 18 (SUTURE) ×3 IMPLANT
SUT VIC AB 2-0 SH 27 (SUTURE) ×3
SUT VIC AB 2-0 SH 27X BRD (SUTURE) ×1 IMPLANT
SUT VIC AB 3-0 SH 27 (SUTURE) ×3
SUT VIC AB 3-0 SH 27XBRD (SUTURE) ×1 IMPLANT
SYR BULB 3OZ (MISCELLANEOUS) ×3 IMPLANT
SYR CONTROL 10ML LL (SYRINGE) ×3 IMPLANT
TOWEL OR 17X24 6PK STRL BLUE (TOWEL DISPOSABLE) ×3 IMPLANT
TOWEL OR 17X26 10 PK STRL BLUE (TOWEL DISPOSABLE) ×3 IMPLANT
TUBE CONNECTING 12'X1/4 (SUCTIONS) ×1
TUBE CONNECTING 12X1/4 (SUCTIONS) ×1 IMPLANT
YANKAUER SUCT BULB TIP NO VENT (SUCTIONS) ×2 IMPLANT

## 2017-01-09 NOTE — Op Note (Signed)
Preoperative diagnosis: postop mastectomy seroma/hematoma Postoperative diagnosis: same as above Procedure: evacuation right mastectomy hematoma with drain placement Surgeon: Dr Serita Grammes Anesthesia: general Drains: 2 19 Fr Blake drains  EBL minimal Complications none Specimens none Sponge and needle count correct dispo to recovery stable  Indications: this is 80 yof who had right mastectomy 9/17 with prolonged drainage.  I removed her drains and drained a moderate seroma in the office. She returned three days later with what appeared to be a hematoma that was tense. I drained additional fluid in office and admitted her for operative evacuation.    Procedure: After informed consent obtained patient taken to the OR.  She was given antibiotics.  SCDs were in place.  She was placed under general anesthesia without complication. She was prepped and draped in standard sterile surgical fashion. A surgical timeout was performed.    I opened the lateral half of the incision. There was a lot of old seroma fluid and blood present.  I evacuated all of this and irrigated. There was really no active bleeding just oozing that was created with opening the area.  I evaluated the entire area with no bleeding or drainage noted.  I applied arista to the entire cavity.  I then placed 2 19 Fr Blake drains and secured these with 2-0 nylon suture.  I then closed the wound with 3-0 vicryl and 4-0 monocryl.  Glue was applied with steristrips. I did place several external simple 3-0 nylon sutures as well. biopatches and tegaderm were placed over drains.  Dressing was placed, binder applied.  She tolerated well and was transferred to pacu stable.

## 2017-01-09 NOTE — Progress Notes (Signed)
Patient arrived back to the floor via bed from PACU. Patient is Alert and Oriented x4. Patient has two JP, both are intact. Breast incision intact with steri strips. Will release orders and continue to monitor patient.

## 2017-01-09 NOTE — Progress Notes (Signed)
   Subjective/Chief Complaint: Pain at surgical site with swelling   Objective: Vital signs in last 24 hours: Temp:  [98.3 F (36.8 C)-98.7 F (37.1 C)] 98.3 F (36.8 C) (10/30 0446) Pulse Rate:  [66-78] 68 (10/30 0446) Resp:  [18-20] 20 (10/30 0446) BP: (144-153)/(52-61) 144/61 (10/30 0446) SpO2:  [96 %-98 %] 96 % (10/30 0446) Last BM Date: 01/08/17  Intake/Output from previous day: 10/29 0701 - 10/30 0700 In: 719.2 [P.O.:320; I.V.:399.2] Out: -  Intake/Output this shift: No intake/output data recorded.  Right mastectomy seroma/hematoma, no infection  Lab Results:   Recent Labs  01/08/17 2036  WBC 8.6  HGB 11.9*  HCT 37.3  PLT 188   BMET  Recent Labs  01/08/17 2036  NA 136  K 3.4*  CL 102  CO2 25  GLUCOSE 168*  BUN 20  CREATININE 1.03*  CALCIUM 9.0   PT/INR No results for input(s): LABPROT, INR in the last 72 hours. ABG No results for input(s): PHART, HCO3 in the last 72 hours.  Invalid input(s): PCO2, PO2  Studies/Results: No results found.  Anti-infectives: Anti-infectives    None      Assessment/Plan: Right mastectomy seroma/hematoma  Evacuate today, drain placement  [facility] 01/09/2017

## 2017-01-09 NOTE — Transfer of Care (Cosign Needed)
Immediate Anesthesia Transfer of Care Note  Patient: Kathy Murphy  Procedure(s) Performed: EVACUATION RIGHT MASTECTOMY FLUID COLLECTION (Right Breast)  Patient Location: PACU  Anesthesia Type:General  Level of Consciousness: awake, oriented and patient cooperative  Airway & Oxygen Therapy: Patient Spontanous Breathing  Post-op Assessment: Report given to RN  Post vital signs: Reviewed and stable  Last Vitals:  Vitals:   01/09/17 1034 01/09/17 1423  BP: (!) 172/60 (!) (P) 151/73  Pulse: 66 70  Resp: 20 15  Temp: 36.6 C 36.6 C  SpO2: 97% 93%    Last Pain:  Vitals:   01/09/17 1034  TempSrc: Oral  PainSc:          Complications: No apparent anesthesia complications

## 2017-01-09 NOTE — Discharge Instructions (Signed)
[facility] °[phone] ° °MASTECTOMY: POST OP INSTRUCTIONS ° °Always review your discharge instruction sheet given to you by the facility where your surgery was performed. °IF YOU HAVE DISABILITY OR FAMILY LEAVE FORMS, YOU MUST BRING THEM TO THE OFFICE FOR PROCESSING.   °DO NOT GIVE THEM TO YOUR DOCTOR. °A prescription for pain medication may be given to you upon discharge.  Take your pain medication as prescribed, if needed.  If narcotic pain medicine is not needed, then you may take acetaminophen (Tylenol), naprosyn (Alleve) or ibuprofen (Advil) as needed. °1. Take your usually prescribed medications unless otherwise directed. °2. If you need a refill on your pain medication, please contact your pharmacy.  They will contact our office to request authorization.  Prescriptions will not be filled after 5pm or on week-ends. °3. You should follow a light diet the first few days after arrival home, such as soup and crackers, etc.  Resume your normal diet the day after surgery. °4. Most patients will experience some swelling and bruising on the chest and underarm.  Ice packs will help.  Swelling and bruising can take several days to resolve. Wear the binder day and night until you return to the office.  °5. It is common to experience some constipation if taking pain medication after surgery.  Increasing fluid intake and taking a stool softener (such as Colace) will usually help or prevent this problem from occurring.  A mild laxative (Milk of Magnesia or Miralax) should be taken according to package instructions if there are no bowel movements after 48 hours. °6. Unless discharge instructions indicate otherwise, leave your bandage dry and in place until your next appointment in 3-5 days.  You may take a limited sponge bath.  No tube baths or showers until the drains are removed.  You may have steri-strips (small skin tapes) in place directly over the incision.  These strips should be left on the  skin for 7-10 days. If you have glue it will come off in next couple week.  Any sutures will be removed at an office visit °7. DRAINS:  If you have drains in place, it is important to keep a list of the amount of drainage produced each day in your drains.  Before leaving the hospital, you should be instructed on drain care.  Call our office if you have any questions about your drains. I will remove your drains when they put out less than 30 cc or ml for 2 consecutive days. °8. ACTIVITIES:  You may resume regular (light) daily activities beginning the next day--such as daily self-care, walking, climbing stairs--gradually increasing activities as tolerated.  You may have sexual intercourse when it is comfortable.  Refrain from any heavy lifting or straining until approved by your doctor. °a. You may drive when you are no longer taking prescription pain medication, you can comfortably wear a seatbelt, and you can safely maneuver your car and apply brakes. °b. RETURN TO WORK:  __________________________________________________________ °9. You should see your doctor in the office for a follow-up appointment approximately 3-5 days after your surgery.  Your doctor’s nurse will typically make your follow-up appointment when she calls you with your pathology report.  Expect your pathology report 3-4business days after surgery. °10. OTHER INSTRUCTIONS: ______________________________________________________________________________________________ ____________________________________________________________________________________________ °WHEN TO CALL YOUR DR Levert Heslop: °1. Fever over 101.0 °2. Nausea and/or vomiting °3. Extreme swelling or bruising °4. Continued bleeding from incision. °5. Increased pain, redness, or drainage from the incision. °The clinic staff is available   to answer your questions during regular business hours.  Please don’t hesitate to call and ask to speak to one of the nurses for clinical concerns.  If  you have a medical emergency, go to the nearest emergency room or call 911.  A surgeon from [facility] is always on call at the hospital. °[address], [address], [place], [place]  [address] ? [address] [address], [place], [place]   [address] °[phone] ? [phone] ? FAX [phone] °Web site: [email or web address] ° °

## 2017-01-09 NOTE — Anesthesia Procedure Notes (Signed)
Procedure Name: LMA Insertion Date/Time: 01/09/2017 1:13 PM Performed by: Manuela Schwartz B Pre-anesthesia Checklist: Patient identified, Emergency Drugs available, Suction available and Patient being monitored Patient Re-evaluated:Patient Re-evaluated prior to induction Oxygen Delivery Method: Circle System Utilized Preoxygenation: Pre-oxygenation with 100% oxygen Induction Type: IV induction Ventilation: Mask ventilation without difficulty LMA: LMA inserted LMA Size: 4.0 Number of attempts: 1 Airway Equipment and Method: Bite block Placement Confirmation: positive ETCO2 Tube secured with: Tape Dental Injury: Teeth and Oropharynx as per pre-operative assessment

## 2017-01-09 NOTE — Anesthesia Preprocedure Evaluation (Addendum)
Anesthesia Evaluation  Patient identified by MRN, date of birth, ID band Patient awake    Reviewed: Allergy & Precautions, H&P , NPO status , Patient's Chart, lab work & pertinent test results  Airway Mallampati: II   Neck ROM: full    Dental   Pulmonary neg pulmonary ROS,    breath sounds clear to auscultation       Cardiovascular hypertension, + angina + CAD, + Past MI, + Cardiac Stents and +CHF   Rhythm:regular Rate:Normal  EF 40-45%   Neuro/Psych    GI/Hepatic GERD  ,  Endo/Other  Hypothyroidism   Renal/GU      Musculoskeletal  (+) Arthritis ,   Abdominal   Peds  Hematology   Anesthesia Other Findings   Reproductive/Obstetrics                            Anesthesia Physical Anesthesia Plan  ASA: III  Anesthesia Plan: General   Post-op Pain Management:    Induction: Intravenous  PONV Risk Score and Plan: 3 and Ondansetron, Dexamethasone and Treatment may vary due to age or medical condition  Airway Management Planned: LMA  Additional Equipment:   Intra-op Plan:   Post-operative Plan:   Informed Consent: I have reviewed the patients History and Physical, chart, labs and discussed the procedure including the risks, benefits and alternatives for the proposed anesthesia with the patient or authorized representative who has indicated his/her understanding and acceptance.     Plan Discussed with: CRNA, Anesthesiologist and Surgeon  Anesthesia Plan Comments:         Anesthesia Quick Evaluation

## 2017-01-10 ENCOUNTER — Encounter (HOSPITAL_COMMUNITY): Payer: Self-pay | Admitting: General Surgery

## 2017-01-10 LAB — BASIC METABOLIC PANEL
ANION GAP: 8 (ref 5–15)
BUN: 16 mg/dL (ref 6–20)
CALCIUM: 9 mg/dL (ref 8.9–10.3)
CO2: 26 mmol/L (ref 22–32)
CREATININE: 0.78 mg/dL (ref 0.44–1.00)
Chloride: 103 mmol/L (ref 101–111)
GFR calc Af Amer: >60 mL/min (ref >60)
GLUCOSE: 118 mg/dL — AB (ref 65–99)
Potassium: 4 mmol/L (ref 3.5–5.1)
Sodium: 137 mmol/L (ref 135–145)

## 2017-01-10 LAB — CBC
HCT: 34.5 % — ABNORMAL LOW (ref 36.0–46.0)
Hemoglobin: 10.8 g/dL — ABNORMAL LOW (ref 12.0–15.0)
MCH: 29.1 pg (ref 26.0–34.0)
MCHC: 31.3 g/dL (ref 30.0–36.0)
MCV: 93 fL (ref 78.0–100.0)
PLATELETS: 185 10*3/uL (ref 150–400)
RBC: 3.71 MIL/uL — ABNORMAL LOW (ref 3.87–5.11)
RDW: 13.8 % (ref 11.5–15.5)
WBC: 13.9 10*3/uL — AB (ref 4.0–10.5)

## 2017-01-10 NOTE — Progress Notes (Signed)
1 Day Post-Op   Subjective/Chief Complaint: Doing well this am, tol po, pain controlled   Objective: Vital signs in last 24 hours: Temp:  [97.5 F (36.4 C)-97.9 F (36.6 C)] 97.6 F (36.4 C) (10/31 0505) Pulse Rate:  [58-79] 58 (10/31 0505) Resp:  [14-20] 16 (10/31 0505) BP: (116-175)/(57-74) 132/59 (10/31 0505) SpO2:  [94 %-100 %] 99 % (10/31 0505) Weight:  [83.9 kg (184 lb 15.5 oz)] 83.9 kg (184 lb 15.5 oz) (10/30 2119) Last BM Date: 01/08/17  Intake/Output from previous day: 10/30 0701 - 10/31 0700 In: 1010 [P.O.:560; I.V.:450] Out: 325 [Drains:275; Blood:50] Intake/Output this shift: No intake/output data recorded.  Right mastectomy incision healing drains bloody as expected flaps viable  Lab Results:   Recent Labs  01/08/17 2036 01/10/17 0643  WBC 8.6 13.9*  HGB 11.9* 10.8*  HCT 37.3 34.5*  PLT 188 185   BMET  Recent Labs  01/08/17 2036 01/10/17 0643  NA 136 137  K 3.4* 4.0  CL 102 103  CO2 25 26  GLUCOSE 168* 118*  BUN 20 16  CREATININE 1.03* 0.78  CALCIUM 9.0 9.0   PT/INR No results for input(s): LABPROT, INR in the last 72 hours. ABG No results for input(s): PHART, HCO3 in the last 72 hours.  Invalid input(s): PCO2, PO2  Studies/Results: No results found.  Anti-infectives: Anti-infectives    Start     Dose/Rate Route Frequency Ordered Stop   01/09/17 1245  ceFAZolin (ANCEF) IVPB 2g/100 mL premix     2 g 200 mL/hr over 30 Minutes Intravenous  Once 01/09/17 0921 01/09/17 1316      Assessment/Plan: POD 1 evacuation right mastectomy hematoma  1. Po pain meds 2. pulm toilet 3. Regular diet 4. Recheck hct in am 5. Pt and case mgt to see today 6. Plan dc tomorrow  Rolm Bookbinder 01/10/2017

## 2017-01-10 NOTE — Evaluation (Signed)
Physical Therapy Evaluation & Discharge Patient Details Name: Kathy Murphy MRN: [id number] DOB: Oct 02, 1927 Today's Date: 01/10/2017   History of Present Illness  Pt is an 83 y/o female s/p evacuation of R mastectomy hematoma wiht drain placement. PMH including but not limited to R breast cancer, R total mastectomy on 11/27/16 and HTN.  Clinical Impression  Pt presented supine in bed with HOB elevated, awake and willing to participate in therapy session. Prior to admission, pt reported that she ambulates with use of either SPC or RW at all times. Pt reported that she is independent with ADLs and continues to drive. Pt ambulated in hallway with use of RW and supervision for safety. Pt with mild instability but no overt LOB or need for physical assistance. Pt appears to be at or very close to her functional baseline at this time. No further acute PT needs identified. PT signing off.     Follow Up Recommendations No PT follow up    Equipment Recommendations  None recommended by PT    Recommendations for Other Services       Precautions / Restrictions Precautions Precautions: Fall Restrictions Weight Bearing Restrictions: No      Mobility  Bed Mobility Overal bed mobility: Modified Independent             General bed mobility comments: increased time  Transfers Overall transfer level: Needs assistance Equipment used: Rolling walker (2 wheeled) Transfers: Sit to/from Stand Sit to Stand: Supervision         General transfer comment: supervision for safety  Ambulation/Gait Ambulation/Gait assistance: Supervision Ambulation Distance (Feet): 150 Feet Assistive device: Rolling walker (2 wheeled) Gait Pattern/deviations: Trunk flexed;Step-through pattern;Decreased stride length Gait velocity: decreased   General Gait Details: mild instability but no overt LOB or need for physical assistance. pt with preference to maintain flexed posture with RW forwards. pt occasionally  ambulating with only one UE support on RW  Stairs            Wheelchair Mobility    Modified Rankin (Stroke Patients Only)       Balance Overall balance assessment: Needs assistance Sitting-balance support: Feet supported Sitting balance-Leahy Scale: Good     Standing balance support: During functional activity;Single extremity supported Standing balance-Leahy Scale: Poor Standing balance comment: reliant on at least one UE support                             Pertinent Vitals/Pain Pain Assessment: No/denies pain    Home Living Family/patient expects to be discharged to:: Private residence Living Arrangements: Alone Available Help at Discharge: Family;Friend(s) Type of Home: House Home Access: Stairs to enter Entrance Stairs-Rails: Left Entrance Stairs-Number of Steps: 3 Home Layout: One level Home Equipment: Cane - single point;Walker - 2 wheels;Walker - 4 wheels;Electric scooter      Prior Function Level of Independence: Independent with assistive device(s)         Comments: ambulates with SPC or RW     Hand Dominance        Extremity/Trunk Assessment   Upper Extremity Assessment Upper Extremity Assessment: Generalized weakness    Lower Extremity Assessment Lower Extremity Assessment: Generalized weakness    Cervical / Trunk Assessment Cervical / Trunk Assessment: Kyphotic  Communication   Communication: No difficulties  Cognition Arousal/Alertness: Awake/alert Behavior During Therapy: WFL for tasks assessed/performed Overall Cognitive Status: No family/caregiver present to determine baseline cognitive functioning Area of Impairment: Memory  Memory: Decreased short-term memory                General Comments      Exercises     Assessment/Plan    PT Assessment Patent does not need any further PT services  PT Problem List         PT Treatment Interventions      PT Goals (Current  goals can be found in the Care Plan section)  Acute Rehab PT Goals Patient Stated Goal: return home    Frequency     Barriers to discharge        Co-evaluation               AM-PAC PT "6 Clicks" Daily Activity  Outcome Measure Difficulty turning over in bed (including adjusting bedclothes, sheets and blankets)?: None Difficulty moving from lying on back to sitting on the side of the bed? : None Difficulty sitting down on and standing up from a chair with arms (e.g., wheelchair, bedside commode, etc,.)?: A Little Help needed moving to and from a bed to chair (including a wheelchair)?: None Help needed walking in hospital room?: A Little Help needed climbing 3-5 steps with a railing? : A Little 6 Click Score: 21    End of Session   Activity Tolerance: Patient tolerated treatment well Patient left: in bed;with call bell/phone within reach Nurse Communication: Mobility status PT Visit Diagnosis: Other abnormalities of gait and mobility (R26.89)    Time: 1130-1157 PT Time Calculation (min) (ACUTE ONLY): 27 min   Charges:   PT Evaluation $PT Eval Moderate Complexity: 1 Mod     PT G Codes:        [place], PT, DPT [place] 01/10/2017, 12:05 PM

## 2017-01-10 NOTE — Care Management Note (Signed)
Case Management Note  Patient Details  Name: Kathy Murphy MRN: [id number] Date of Birth: [birth year]  Subjective/Objective:                    Action/Plan:  Await PT eval, and MD orders with face to face Expected Discharge Date:  01/10/17               Expected Discharge Plan:     In-House Referral:     Discharge planning Services  CM Consult  Post Acute Care Choice:  Home Health Choice offered to:     DME Arranged:    DME Agency:     HH Arranged:    [place] Agency:     Status of Service:  In process, will continue to follow  If discussed at Long Length of Stay Meetings, dates discussed:    Additional Comments:  Marilu Favre, RN 01/10/2017, 10:03 AM

## 2017-01-10 NOTE — Anesthesia Postprocedure Evaluation (Signed)
Anesthesia Post Note  Patient: Kathy Murphy  Procedure(s) Performed: EVACUATION RIGHT MASTECTOMY FLUID COLLECTION (Right Breast)     Patient location during evaluation: PACU Anesthesia Type: General Level of consciousness: awake and alert Pain management: pain level controlled Vital Signs Assessment: post-procedure vital signs reviewed and stable Respiratory status: spontaneous breathing, nonlabored ventilation, respiratory function stable and patient connected to nasal cannula oxygen Cardiovascular status: blood pressure returned to baseline and stable Postop Assessment: no apparent nausea or vomiting Anesthetic complications: no    Last Vitals:  Vitals:   01/09/17 2119 01/10/17 0505  BP: (!) 116/57 (!) 132/59  Pulse: 79 (!) 58  Resp: 16 16  Temp: (!) 36.4 C 36.4 C  SpO2: 97% 99%    Last Pain:  Vitals:   01/10/17 0505  TempSrc: Oral  PainSc:                  Maurie Olesen DAVID

## 2017-01-10 NOTE — Care Management Note (Signed)
Case Management Note  Patient Details  Name: Kathy Murphy MRN: [id number] Date of Birth: Oct 20, 1927  Subjective/Objective:                    Action/Plan: Patient has 2 walkers , cane and Rolator already at home. PT recommend no follow up.   Patient would like home health RN to check incision site and JP's   Expected Discharge Date:  01/10/17               Expected Discharge Plan:  [place]  In-House Referral:     Discharge planning Services  CM Consult  Post Acute Care Choice:  Home Health Choice offered to:  Patient  DME Arranged:    DME Agency:     HH Arranged:  RN [place] Agency:  [place]  Status of Service:  Completed, signed off  If discussed at [place] of Stay Meetings, dates discussed:    Additional Comments:  Marilu Favre, RN 01/10/2017, 1:51 PM

## 2017-01-11 LAB — CBC
HCT: 34.9 % — ABNORMAL LOW (ref 36.0–46.0)
HEMOGLOBIN: 10.8 g/dL — AB (ref 12.0–15.0)
MCH: 29 pg (ref 26.0–34.0)
MCHC: 30.9 g/dL (ref 30.0–36.0)
MCV: 93.8 fL (ref 78.0–100.0)
PLATELETS: 192 10*3/uL (ref 150–400)
RBC: 3.72 MIL/uL — ABNORMAL LOW (ref 3.87–5.11)
RDW: 13.8 % (ref 11.5–15.5)
WBC: 11.1 10*3/uL — ABNORMAL HIGH (ref 4.0–10.5)

## 2017-01-11 NOTE — Progress Notes (Signed)
2 Days Post-Op   Subjective/Chief Complaint: Doing fine, ready to go home   Objective: Vital signs in last 24 hours: Temp:  [98.2 F (36.8 C)-99.5 F (37.5 C)] 99.5 F (37.5 C) (11/01 0522) Pulse Rate:  [70-91] 91 (11/01 0522) Resp:  [16] 16 (11/01 0522) BP: (109-138)/(57) 138/57 (10/31 2134) SpO2:  [95 %-98 %] 95 % (11/01 0522) Last BM Date: 01/08/17  Intake/Output from previous day: 10/31 0701 - 11/01 0700 In: 480 [P.O.:480] Out: 160 [Drains:160] Intake/Output this shift: No intake/output data recorded.  Incision clean, drains less bloody, flaps viable  Lab Results:   Recent Labs  01/10/17 0643 01/11/17 0728  WBC 13.9* 11.1*  HGB 10.8* 10.8*  HCT 34.5* 34.9*  PLT 185 192   BMET  Recent Labs  01/08/17 2036 01/10/17 0643  NA 136 137  K 3.4* 4.0  CL 102 103  CO2 25 26  GLUCOSE 168* 118*  BUN 20 16  CREATININE 1.03* 0.78  CALCIUM 9.0 9.0   PT/INR No results for input(s): LABPROT, INR in the last 72 hours. ABG No results for input(s): PHART, HCO3 in the last 72 hours.  Invalid input(s): PCO2, PO2  Studies/Results: No results found.  Anti-infectives: Anti-infectives    Start     Dose/Rate Route Frequency Ordered Stop   01/09/17 1245  ceFAZolin (ANCEF) IVPB 2g/100 mL premix     2 g 200 mL/hr over 30 Minutes Intravenous  Once 01/09/17 0921 01/09/17 1316      Assessment/Plan: POD 2 evacuation of hematoma  Dc home today Fu next week  [facility] 01/11/2017

## 2017-01-11 NOTE — Progress Notes (Signed)
Discharge instructions (including medications) discussed with and copy provided to patient/caregiver 

## 2017-01-12 DIAGNOSIS — Z9011 Acquired absence of right breast and nipple: Secondary | ICD-10-CM | POA: Diagnosis not present

## 2017-01-12 DIAGNOSIS — C50911 Malignant neoplasm of unspecified site of right female breast: Secondary | ICD-10-CM | POA: Diagnosis not present

## 2017-01-12 DIAGNOSIS — Z4801 Encounter for change or removal of surgical wound dressing: Secondary | ICD-10-CM | POA: Diagnosis not present

## 2017-01-12 DIAGNOSIS — I119 Hypertensive heart disease without heart failure: Secondary | ICD-10-CM | POA: Diagnosis not present

## 2017-01-12 DIAGNOSIS — Z7902 Long term (current) use of antithrombotics/antiplatelets: Secondary | ICD-10-CM | POA: Diagnosis not present

## 2017-01-12 DIAGNOSIS — I252 Old myocardial infarction: Secondary | ICD-10-CM | POA: Diagnosis not present

## 2017-01-12 DIAGNOSIS — K219 Gastro-esophageal reflux disease without esophagitis: Secondary | ICD-10-CM | POA: Diagnosis not present

## 2017-01-12 DIAGNOSIS — Z85850 Personal history of malignant neoplasm of thyroid: Secondary | ICD-10-CM | POA: Diagnosis not present

## 2017-01-12 DIAGNOSIS — M1991 Primary osteoarthritis, unspecified site: Secondary | ICD-10-CM | POA: Diagnosis not present

## 2017-01-12 DIAGNOSIS — L7632 Postprocedural hematoma of skin and subcutaneous tissue following other procedure: Secondary | ICD-10-CM | POA: Diagnosis not present

## 2017-01-12 DIAGNOSIS — L7634 Postprocedural seroma of skin and subcutaneous tissue following other procedure: Secondary | ICD-10-CM | POA: Diagnosis not present

## 2017-01-13 NOTE — Discharge Summary (Signed)
Physician Discharge Summary  Patient ID: Kathy Murphy MRN: [id number] DOB/AGE: 1927-05-16 83 y.o.  Admit date: 01/08/2017 Discharge date: 01/13/2017  Admission Diagnoses: Right breast cancer htn History mi gerd  Discharge Diagnoses:  Active Problems:   Seroma of breast   Discharged Condition: good  Hospital Course: 79 yof who had right mastectomy. Drains removed and had seroma. Drained in office and then returned with significant seroma/hematoma. Admitted and taken to or for drainage and new drain placement.  She did well postop and will be discharged with follow up next week.  Consults: none  Significant Diagnostic Studies: none    Disposition: 01-Home or Self Care   Allergies as of 01/11/2017      Reactions   Other Other (See Comments)   SOME OF THE "MYCINS"...UNKNOWN REACTION.   Latex Rash      Medication List    TAKE these medications   anastrozole 1 MG tablet Commonly known as:  ARIMIDEX Take 1 tablet (1 mg total) by mouth daily.   artificial tears ointment Place 1 drop into both eyes 3 (three) times daily as needed (for dry eyes).   clopidogrel 75 MG tablet Commonly known as:  PLAVIX Take 1 tablet (75 mg total) by mouth daily with breakfast.   Diclofenac Sodium CR 100 MG 24 hr tablet Take 100 mg by mouth daily.   gabapentin 100 MG capsule Commonly known as:  NEURONTIN Take 1 capsule (100 mg total) by mouth at bedtime.   levothyroxine 125 MCG tablet Commonly known as:  SYNTHROID, LEVOTHROID Take 125 mcg by mouth daily before breakfast.   losartan 100 MG tablet Commonly known as:  COZAAR Take 100 mg by mouth daily.   metoprolol succinate 100 MG 24 hr tablet Commonly known as:  TOPROL-XL Take 1 tablet (100 mg total) by mouth daily. Take with or immediately following a meal.   pantoprazole 40 MG tablet Commonly known as:  PROTONIX Take 40 mg by mouth daily.   rosuvastatin 10 MG tablet Commonly known as:  CRESTOR Take 10 mg by mouth every  morning.      Follow-up Information    Rolm Bookbinder, MD Follow up in 1 week(s).   Specialty:  General Surgery Contact information: [address] [place] [address] [phone]        [facility] - Dme Follow up.   Why:  Will provide home health RN Contact information: [address] [place] [address] [phone]           Signed: Rolm Bookbinder 01/13/2017, 2:24 PM

## 2017-01-15 DIAGNOSIS — L7634 Postprocedural seroma of skin and subcutaneous tissue following other procedure: Secondary | ICD-10-CM | POA: Diagnosis not present

## 2017-01-15 DIAGNOSIS — Z9011 Acquired absence of right breast and nipple: Secondary | ICD-10-CM | POA: Diagnosis not present

## 2017-01-15 DIAGNOSIS — Z4801 Encounter for change or removal of surgical wound dressing: Secondary | ICD-10-CM | POA: Diagnosis not present

## 2017-01-15 DIAGNOSIS — C50911 Malignant neoplasm of unspecified site of right female breast: Secondary | ICD-10-CM | POA: Diagnosis not present

## 2017-01-15 DIAGNOSIS — I252 Old myocardial infarction: Secondary | ICD-10-CM | POA: Diagnosis not present

## 2017-01-15 DIAGNOSIS — L7632 Postprocedural hematoma of skin and subcutaneous tissue following other procedure: Secondary | ICD-10-CM | POA: Diagnosis not present

## 2017-01-17 DIAGNOSIS — I252 Old myocardial infarction: Secondary | ICD-10-CM | POA: Diagnosis not present

## 2017-01-17 DIAGNOSIS — Z9011 Acquired absence of right breast and nipple: Secondary | ICD-10-CM | POA: Diagnosis not present

## 2017-01-17 DIAGNOSIS — L7632 Postprocedural hematoma of skin and subcutaneous tissue following other procedure: Secondary | ICD-10-CM | POA: Diagnosis not present

## 2017-01-17 DIAGNOSIS — Z4801 Encounter for change or removal of surgical wound dressing: Secondary | ICD-10-CM | POA: Diagnosis not present

## 2017-01-17 DIAGNOSIS — L7634 Postprocedural seroma of skin and subcutaneous tissue following other procedure: Secondary | ICD-10-CM | POA: Diagnosis not present

## 2017-01-17 DIAGNOSIS — C50911 Malignant neoplasm of unspecified site of right female breast: Secondary | ICD-10-CM | POA: Diagnosis not present

## 2017-01-18 DIAGNOSIS — Z4801 Encounter for change or removal of surgical wound dressing: Secondary | ICD-10-CM | POA: Diagnosis not present

## 2017-01-18 DIAGNOSIS — L7634 Postprocedural seroma of skin and subcutaneous tissue following other procedure: Secondary | ICD-10-CM | POA: Diagnosis not present

## 2017-01-18 DIAGNOSIS — I252 Old myocardial infarction: Secondary | ICD-10-CM | POA: Diagnosis not present

## 2017-01-18 DIAGNOSIS — C50911 Malignant neoplasm of unspecified site of right female breast: Secondary | ICD-10-CM | POA: Diagnosis not present

## 2017-01-18 DIAGNOSIS — Z9011 Acquired absence of right breast and nipple: Secondary | ICD-10-CM | POA: Diagnosis not present

## 2017-01-18 DIAGNOSIS — Z6827 Body mass index (BMI) 27.0-27.9, adult: Secondary | ICD-10-CM | POA: Diagnosis not present

## 2017-01-18 DIAGNOSIS — M79671 Pain in right foot: Secondary | ICD-10-CM | POA: Diagnosis not present

## 2017-01-18 DIAGNOSIS — M25561 Pain in right knee: Secondary | ICD-10-CM | POA: Diagnosis not present

## 2017-01-18 DIAGNOSIS — M25571 Pain in right ankle and joints of right foot: Secondary | ICD-10-CM | POA: Insufficient documentation

## 2017-01-18 DIAGNOSIS — L7632 Postprocedural hematoma of skin and subcutaneous tissue following other procedure: Secondary | ICD-10-CM | POA: Diagnosis not present

## 2017-01-18 DIAGNOSIS — M25551 Pain in right hip: Secondary | ICD-10-CM | POA: Insufficient documentation

## 2017-01-19 NOTE — Progress Notes (Signed)
[facility]  [phone] Fax:([phone]) [phone]     ID: Kathy Murphy DOB: 11-22-1977  MR#: [id number]  MNO#:[id number]  Patient Care Team: Burnard Bunting, MD as PCP - General (Internal Medicine) Geryl Dohn, Virgie Dad, MD as Consulting Physician (Oncology) Rolm Bookbinder, MD as Consulting Physician (General Surgery) Croitoru, Dani Gobble, MD as Consulting Physician (Cardiology) OTHER MD:  CHIEF COMPLAINT: Estrogen receptor positive invasive lobular breast cancer  CURRENT TREATMENT: Anastrozole   BREAST CANCER HISTORY: From the original intake note:  Kathy Murphy had a change in her right breast and was referred to [facility] where on 04/14/2016 bilateral diagnostic mammography with tomography and right breast ultrasound was obtained. The breast density was category B. In the central right breast there was an oval mass with no other findings of concern. Ultrasound located a benign 0.3 cm cyst in the upper inner quadrant of the right breast correlating with the mammography findings. Routine mammography was recommended for one year.  However with further changes in the right breast note by the patient and her primary care MD, repeat right diagnostic mammography with tomography and repeat right breast ultrasonography 08/21/2016 now found a 3 cm area of asymmetry in the upper outer right breast which on physical exam measured approximately 2-1/2 cm at the 10:00 location 8 cm from the nipple. Ultrasound of this area showed a 0.5 cm hypoechoic mass with a larger 3 cm area of hazy isoechoic tissue with abnormal architecture. The right axilla showed 2 normal-appearing lymph nodes.  Biopsy of the right breast upper outer quadrant mass 08/28/2016 showed ([id number]) an invasive lobular carcinoma, E-cadherin negative, estrogen receptor 85% positive, progesterone receptor 100% positive, both with strong staining intensity, with an MIB-1 of 5%, and no HER-2 implication, the signals ratio being  1.49 and the number per cell 3.21.  The patient's subsequent history is as detailed below.  INTERVAL HISTORY: Kathy Murphy returns today for follow-up and treatment of her estrogen receptor positive breast cancer.  She continues on anastrozole. Which she tolerates well. She still experiences hot flashes, but has not tried taking gabapentin, because she doesn't want to be too fatigued.   On 01/09/2017, Dr. Donne Hazel preformed Evacuation Right Mastectomy Fluid Collection Surgery in an attempt to get the drainage issue resolved.. She still has her drains in place.  She brought [place] a record of her drainage and yesterday one drain was 50 and 30 the other 130 and 50 cc  REVIEW OF SYSTEMS: Chanah reports that she has right knee pain and will take ibuprofen with some relief. She does not take anything for the discomfort in her chest from the surgeries and having the drains in place. She notes that it is tolerable. She denies unusual headaches, visual changes, nausea, vomiting, or dizziness. There has been no unusual cough, phlegm production, or pleurisy. This been no change in bowel or bladder habits. She denies unexplained fatigue or unexplained weight loss, bleeding, rash, or fever. A detailed review of systems was otherwise entirely stable.   PAST MEDICAL HISTORY: Past Medical History:  Diagnosis Date  . Arthritis   . Breast cancer ([place])    Right  . Cancer ([place])    thyroid  . GERD (gastroesophageal reflux disease)   . GI bleed 1980   AFTER POLYP EXCISION  . Heart disease, hypertensive   . Hypercholesterolemia   . Hypertension   . Hypothyroidism   . Incontinence of urine   . Myalgia   . Myocardial infarction ([place])   . Obesity   .  Pain in joints   . Retinal detachment   . Unsteady gait   . UTI (urinary tract infection)   . Vomiting     PAST SURGICAL HISTORY: Past Surgical History:  Procedure Laterality Date  . ABDOMINAL HYSTERECTOMY  1977  . BREAST BIOPSY    . BREAST LUMPECTOMY     x5  .  CATARACT EXTRACTION, BILATERAL    . CERVICAL LAMINECTOMY    . [place]  . FOOT SURGERY    . NECK SURGERY    . THYROIDECTOMY  1957  . TOTAL HIP ARTHROPLASTY  2010-05-04   left  . TOTAL MASTECTOMY Right 11/27/2016    FAMILY HISTORY Family History  Problem Relation Age of Onset  . Heart disease Mother   The patient has little information regarding her father. Her mother died at age 81 in a nursing home from what the patient thinks may have been heart disease. The patient had no brothers and no sisters.  GYNECOLOGIC HISTORY:  No LMP recorded. Patient is postmenopausal. She thinks her first menstrual period may have been age 9. She never carried a child to term. She underwent hysterectomy with bilateral salpingo-oophorectomy in her 44s. She was on estrogen replacement until July 2018  SOCIAL HISTORY:  She is a retired Glass blower/designer for [employer]. Her husband died in 05-04-2012. She lives by herself, with no pets, in a fairly large house on [place] acres she says. She does much of the house work herself. She also pays all her bills and keeps all her accounts.    ADVANCED DIRECTIVES: In place. She has named her close friend [name] as her healthcare power of attorney. He can be reached at (cell) [phone] or (home) [phone]   HEALTH MAINTENANCE: Social History   Tobacco Use  . Smoking status: Never Smoker  . Smokeless tobacco: Never Used  Substance Use Topics  . Alcohol use: No  . Drug use: No     Colonoscopy: November 2011/[facility]  PAP: 05-04-2014  Bone density:At [facility] 08/30/2010 shows mild osteopenia, with the lowest T score -0.7 at the right hip   Allergies  Allergen Reactions  . Other Other (See Comments)    SOME OF THE "MYCINS"...UNKNOWN REACTION.  Marland Kitchen Latex Rash    Current Outpatient Medications  Medication Sig Dispense Refill  . anastrozole (ARIMIDEX) 1 MG tablet Take 1 tablet (1 mg total) by mouth daily. 90 tablet 4  . Artificial  Tear Ointment (ARTIFICIAL TEARS) ointment Place 1 drop into both eyes 3 (three) times daily as needed (for dry eyes).     . clopidogrel (PLAVIX) 75 MG tablet Take 1 tablet (75 mg total) by mouth daily with breakfast. 90 tablet 3  . Diclofenac Sodium CR 100 MG 24 hr tablet Take 100 mg by mouth daily.    Marland Kitchen gabapentin (NEURONTIN) 100 MG capsule Take 1 capsule (100 mg total) by mouth at bedtime. 30 capsule 6  . levothyroxine (SYNTHROID, LEVOTHROID) 125 MCG tablet Take 125 mcg by mouth daily before breakfast.    . losartan (COZAAR) 100 MG tablet Take 100 mg by mouth daily.     . metoprolol succinate (TOPROL-XL) 100 MG 24 hr tablet Take 1 tablet (100 mg total) by mouth daily. Take with or immediately following a meal. 90 tablet 5  . pantoprazole (PROTONIX) 40 MG tablet Take 40 mg by mouth daily.      . rosuvastatin (CRESTOR) 10 MG tablet Take 10 mg by mouth every morning.  No current facility-administered medications for this visit.     OBJECTIVE: Older white woman who appears stated age  83:   01/22/17 1006  BP: (!) 161/77  Pulse: 72  Resp: 20  Temp: 100 F (37.8 C)  SpO2: 100%     Body mass index is 27.72 kg/m.   Wt Readings from Last 3 Encounters:  01/22/17 182 lb 4.8 oz (82.7 kg)  01/09/17 184 lb 15.5 oz (83.9 kg)  12/18/16 182 lb 11.2 oz (82.9 kg)      ECOG FS:2 - Symptomatic, <50% confined to bed  Sclerae unicteric, pupils round and equal Oropharynx clear and moist No cervical or supraclavicular adenopathy Lungs no rales or rhonchi Heart regular rate and rhythm Abd soft, nontender, positive bowel sounds MSK mild kyphosis but no focal spinal tenderness, no upper extremity lymphedema Neuro: nonfocal, well oriented, appropriate affect Breasts: The right breast is status post mastectomy.  There is a minimal area of erythema superiorly.  There is a drain in place with some crusting around the exit site but no clear evidence of infection.  The second drain has less fluid in  it and it is slightly more turbid.  Both axillae are benign   LAB RESULTS:  CMP     Component Value Date/Time   NA 137 01/10/2017 0643   NA 139 09/25/2016 1524   K 4.0 01/10/2017 0643   K 4.0 09/25/2016 1524   CL 103 01/10/2017 0643   CO2 26 01/10/2017 0643   CO2 27 09/25/2016 1524   GLUCOSE 118 (H) 01/10/2017 0643   GLUCOSE 132 09/25/2016 1524   BUN 16 01/10/2017 0643   BUN 18.7 09/25/2016 1524   CREATININE 0.78 01/10/2017 0643   CREATININE 0.9 09/25/2016 1524   CALCIUM 9.0 01/10/2017 0643   CALCIUM 9.1 09/25/2016 1524   PROT 6.9 09/25/2016 1524   ALBUMIN 3.7 09/25/2016 1524   AST 12 09/25/2016 1524   ALT 13 09/25/2016 1524   ALKPHOS 95 09/25/2016 1524   BILITOT 0.75 09/25/2016 1524   GFRNONAA >60 01/10/2017 0643   GFRAA >60 01/10/2017 0643    No results found for: TOTALPROTELP, ALBUMINELP, A1GS, A2GS, BETS, BETA2SER, GAMS, MSPIKE, SPEI  No results found for: Nils Pyle, [facility]  Lab Results  Component Value Date   WBC 10.7 (H) 01/22/2017   NEUTROABS 8.4 (H) 01/22/2017   HGB 10.9 (L) 01/22/2017   HCT 33.7 (L) 01/22/2017   MCV 90.7 01/22/2017   PLT 247 01/22/2017      Chemistry      Component Value Date/Time   NA 137 01/10/2017 0643   NA 139 09/25/2016 1524   K 4.0 01/10/2017 0643   K 4.0 09/25/2016 1524   CL 103 01/10/2017 0643   CO2 26 01/10/2017 0643   CO2 27 09/25/2016 1524   BUN 16 01/10/2017 0643   BUN 18.7 09/25/2016 1524   CREATININE 0.78 01/10/2017 0643   CREATININE 0.9 09/25/2016 1524      Component Value Date/Time   CALCIUM 9.0 01/10/2017 0643   CALCIUM 9.1 09/25/2016 1524   ALKPHOS 95 09/25/2016 1524   AST 12 09/25/2016 1524   ALT 13 09/25/2016 1524   BILITOT 0.75 09/25/2016 1524       No results found for: LABCA2  No components found for: [id number]  No results for input(s): INR in the last 168 hours.  Urinalysis    Component Value Date/Time   COLORURINE YELLOW 12/26/2012 0843   APPEARANCEUR CLOUDY (A)  12/26/2012 8099  LABSPEC 1.019 12/26/2012 0843   PHURINE 6.0 12/26/2012 0843   GLUCOSEU NEGATIVE 12/26/2012 0843   HGBUR SMALL (A) 12/26/2012 0843   BILIRUBINUR NEGATIVE 12/26/2012 0843   KETONESUR NEGATIVE 12/26/2012 0843   PROTEINUR NEGATIVE 12/26/2012 0843   UROBILINOGEN 0.2 12/26/2012 0843   NITRITE NEGATIVE 12/26/2012 0843   LEUKOCYTESUR NEGATIVE 12/26/2012 0843     STUDIES: Bone density scan has been scheduled for mid December   ELIGIBLE FOR AVAILABLE RESEARCH PROTOCOL: No  ASSESSMENT: 83 y.o. [place], [place] woman status post right breast upper outer quadrant biopsy 08/28/2016 for a clinical T2 N0, stage IB invasive lobular carcinoma, grade not stated, estrogen and progesterone strongly positive, HER-2 nonamplified, with an MIB-1 of 5%.  (1) anastrozole started 09/25/2016  (2) Status post right mastectomy without sentinel lymph node sampling 11/27/2016 for a pT2 cN0 invasive lobular carcinoma, grade 1, with negative margins.  (3) no adjuvant radiation indicated  PLAN: Kamarri is doing generally well as far as her breast cancer is concerned, but of course she is greatly bothered by the drains and is hoping they can come out soon.  She has great trust in her surgeon Dr. Donne Hazel and greatly appreciates the care he is giving her.  She tolerated the most recent procedure quite well  She is also doing very well on the anastrozole.  The plan will be to continue that for a total of 5 years.  I have rescheduled her bone density scan, which did not get done before this visit, to mid December.  She will return to see me in January  She knows to call for any other issues that may develop before then.  Chauncey Cruel, MD 01/22/17 10:16 AM  Medical Oncology and Hematology [facility] [address] [place], [place] [address] Tel. [phone] Fax. [phone]  This document serves as a record of services personally performed by Chauncey Cruel, MD. It was  created on his behalf by Margit Banda, a trained medical scribe. The creation of this record is based on the scribe's personal observations and the provider's statements to them.   I have reviewed the above documentation for accuracy and completeness, and I agree with the above.

## 2017-01-22 ENCOUNTER — Ambulatory Visit (HOSPITAL_BASED_OUTPATIENT_CLINIC_OR_DEPARTMENT_OTHER): Payer: Medicare Other | Admitting: Oncology

## 2017-01-22 ENCOUNTER — Other Ambulatory Visit (HOSPITAL_BASED_OUTPATIENT_CLINIC_OR_DEPARTMENT_OTHER): Payer: Medicare Other

## 2017-01-22 VITALS — BP 161/77 | HR 72 | Temp 100.0°F | Resp 20 | Ht 68.0 in | Wt 182.3 lb

## 2017-01-22 DIAGNOSIS — C50411 Malignant neoplasm of upper-outer quadrant of right female breast: Secondary | ICD-10-CM | POA: Diagnosis not present

## 2017-01-22 DIAGNOSIS — Z170 Estrogen receptor positive status [ER+]: Secondary | ICD-10-CM

## 2017-01-22 DIAGNOSIS — Z79811 Long term (current) use of aromatase inhibitors: Secondary | ICD-10-CM | POA: Diagnosis not present

## 2017-01-22 DIAGNOSIS — N951 Menopausal and female climacteric states: Secondary | ICD-10-CM | POA: Diagnosis not present

## 2017-01-22 DIAGNOSIS — Z9011 Acquired absence of right breast and nipple: Secondary | ICD-10-CM | POA: Diagnosis not present

## 2017-01-22 LAB — COMPREHENSIVE METABOLIC PANEL
ALBUMIN: 3.1 g/dL — AB (ref 3.5–5.0)
ALK PHOS: 92 U/L (ref 40–150)
ALT: 12 U/L (ref 0–55)
ANION GAP: 7 meq/L (ref 3–11)
AST: 10 U/L (ref 5–34)
BUN: 17.9 mg/dL (ref 7.0–26.0)
CALCIUM: 9.1 mg/dL (ref 8.4–10.4)
CO2: 29 mEq/L (ref 22–29)
Chloride: 104 mEq/L (ref 98–109)
Creatinine: 0.8 mg/dL (ref 0.6–1.1)
GLUCOSE: 101 mg/dL (ref 70–140)
POTASSIUM: 4.1 meq/L (ref 3.5–5.1)
SODIUM: 140 meq/L (ref 136–145)
Total Bilirubin: 0.37 mg/dL (ref 0.20–1.20)
Total Protein: 6.5 g/dL (ref 6.4–8.3)

## 2017-01-22 LAB — CBC WITH DIFFERENTIAL/PLATELET
BASO%: 0.5 % (ref 0.0–2.0)
BASOS ABS: 0.1 10*3/uL (ref 0.0–0.1)
EOS ABS: 0.3 10*3/uL (ref 0.0–0.5)
EOS%: 2.5 % (ref 0.0–7.0)
HCT: 33.7 % — ABNORMAL LOW (ref 34.8–46.6)
HEMOGLOBIN: 10.9 g/dL — AB (ref 11.6–15.9)
LYMPH%: 11.4 % — ABNORMAL LOW (ref 14.0–49.7)
MCH: 29.4 pg (ref 25.1–34.0)
MCHC: 32.4 g/dL (ref 31.5–36.0)
MCV: 90.7 fL (ref 79.5–101.0)
MONO#: 0.8 10*3/uL (ref 0.1–0.9)
MONO%: 7.1 % (ref 0.0–14.0)
NEUT#: 8.4 10*3/uL — ABNORMAL HIGH (ref 1.5–6.5)
NEUT%: 78.5 % — ABNORMAL HIGH (ref 38.4–76.8)
Platelets: 247 10*3/uL (ref 145–400)
RBC: 3.72 10*6/uL (ref 3.70–5.45)
RDW: 13.8 % (ref 11.2–14.5)
WBC: 10.7 10*3/uL — ABNORMAL HIGH (ref 3.9–10.3)
lymph#: 1.2 10*3/uL (ref 0.9–3.3)

## 2017-01-23 ENCOUNTER — Encounter: Payer: Self-pay | Admitting: *Deleted

## 2017-01-23 DIAGNOSIS — C50911 Malignant neoplasm of unspecified site of right female breast: Secondary | ICD-10-CM | POA: Diagnosis not present

## 2017-01-23 DIAGNOSIS — Z4801 Encounter for change or removal of surgical wound dressing: Secondary | ICD-10-CM | POA: Diagnosis not present

## 2017-01-23 DIAGNOSIS — Z9011 Acquired absence of right breast and nipple: Secondary | ICD-10-CM | POA: Diagnosis not present

## 2017-01-23 DIAGNOSIS — I252 Old myocardial infarction: Secondary | ICD-10-CM | POA: Diagnosis not present

## 2017-01-23 DIAGNOSIS — L7634 Postprocedural seroma of skin and subcutaneous tissue following other procedure: Secondary | ICD-10-CM | POA: Diagnosis not present

## 2017-01-23 DIAGNOSIS — L7632 Postprocedural hematoma of skin and subcutaneous tissue following other procedure: Secondary | ICD-10-CM | POA: Diagnosis not present

## 2017-01-27 ENCOUNTER — Telehealth: Payer: Self-pay | Admitting: Oncology

## 2017-01-27 NOTE — Telephone Encounter (Signed)
Scheduled appt per 11/12 los - sent reminder letter in the mail - lab and f/u in late Jan 2019 -

## 2017-01-29 DIAGNOSIS — I252 Old myocardial infarction: Secondary | ICD-10-CM | POA: Diagnosis not present

## 2017-01-29 DIAGNOSIS — Z9011 Acquired absence of right breast and nipple: Secondary | ICD-10-CM | POA: Diagnosis not present

## 2017-01-29 DIAGNOSIS — L7634 Postprocedural seroma of skin and subcutaneous tissue following other procedure: Secondary | ICD-10-CM | POA: Diagnosis not present

## 2017-01-29 DIAGNOSIS — C50911 Malignant neoplasm of unspecified site of right female breast: Secondary | ICD-10-CM | POA: Diagnosis not present

## 2017-01-29 DIAGNOSIS — Z4801 Encounter for change or removal of surgical wound dressing: Secondary | ICD-10-CM | POA: Diagnosis not present

## 2017-01-29 DIAGNOSIS — L7632 Postprocedural hematoma of skin and subcutaneous tissue following other procedure: Secondary | ICD-10-CM | POA: Diagnosis not present

## 2017-01-31 DIAGNOSIS — G8929 Other chronic pain: Secondary | ICD-10-CM | POA: Diagnosis not present

## 2017-01-31 DIAGNOSIS — M25561 Pain in right knee: Secondary | ICD-10-CM | POA: Diagnosis not present

## 2017-01-31 DIAGNOSIS — M1711 Unilateral primary osteoarthritis, right knee: Secondary | ICD-10-CM | POA: Diagnosis not present

## 2017-02-07 DIAGNOSIS — C50911 Malignant neoplasm of unspecified site of right female breast: Secondary | ICD-10-CM | POA: Diagnosis not present

## 2017-02-07 DIAGNOSIS — L7632 Postprocedural hematoma of skin and subcutaneous tissue following other procedure: Secondary | ICD-10-CM | POA: Diagnosis not present

## 2017-02-07 DIAGNOSIS — M1711 Unilateral primary osteoarthritis, right knee: Secondary | ICD-10-CM | POA: Diagnosis not present

## 2017-02-07 DIAGNOSIS — Z4801 Encounter for change or removal of surgical wound dressing: Secondary | ICD-10-CM | POA: Diagnosis not present

## 2017-02-07 DIAGNOSIS — L7634 Postprocedural seroma of skin and subcutaneous tissue following other procedure: Secondary | ICD-10-CM | POA: Diagnosis not present

## 2017-02-07 DIAGNOSIS — Z9011 Acquired absence of right breast and nipple: Secondary | ICD-10-CM | POA: Diagnosis not present

## 2017-02-07 DIAGNOSIS — I252 Old myocardial infarction: Secondary | ICD-10-CM | POA: Diagnosis not present

## 2017-02-12 DIAGNOSIS — M2041 Other hammer toe(s) (acquired), right foot: Secondary | ICD-10-CM | POA: Diagnosis not present

## 2017-02-12 DIAGNOSIS — M6701 Short Achilles tendon (acquired), right ankle: Secondary | ICD-10-CM | POA: Diagnosis not present

## 2017-02-12 DIAGNOSIS — M79671 Pain in right foot: Secondary | ICD-10-CM | POA: Diagnosis not present

## 2017-02-12 DIAGNOSIS — M76821 Posterior tibial tendinitis, right leg: Secondary | ICD-10-CM | POA: Diagnosis not present

## 2017-02-13 DIAGNOSIS — M1711 Unilateral primary osteoarthritis, right knee: Secondary | ICD-10-CM | POA: Diagnosis not present

## 2017-02-14 DIAGNOSIS — L7634 Postprocedural seroma of skin and subcutaneous tissue following other procedure: Secondary | ICD-10-CM | POA: Diagnosis not present

## 2017-02-14 DIAGNOSIS — L7632 Postprocedural hematoma of skin and subcutaneous tissue following other procedure: Secondary | ICD-10-CM | POA: Diagnosis not present

## 2017-02-14 DIAGNOSIS — Z4801 Encounter for change or removal of surgical wound dressing: Secondary | ICD-10-CM | POA: Diagnosis not present

## 2017-02-14 DIAGNOSIS — I252 Old myocardial infarction: Secondary | ICD-10-CM | POA: Diagnosis not present

## 2017-02-14 DIAGNOSIS — Z9011 Acquired absence of right breast and nipple: Secondary | ICD-10-CM | POA: Diagnosis not present

## 2017-02-14 DIAGNOSIS — C50911 Malignant neoplasm of unspecified site of right female breast: Secondary | ICD-10-CM | POA: Diagnosis not present

## 2017-02-17 ENCOUNTER — Other Ambulatory Visit: Payer: Self-pay | Admitting: Physician Assistant

## 2017-02-21 DIAGNOSIS — M1711 Unilateral primary osteoarthritis, right knee: Secondary | ICD-10-CM | POA: Diagnosis not present

## 2017-02-22 ENCOUNTER — Ambulatory Visit: Payer: Medicare Other | Admitting: Physical Therapy

## 2017-02-26 DIAGNOSIS — L7632 Postprocedural hematoma of skin and subcutaneous tissue following other procedure: Secondary | ICD-10-CM | POA: Diagnosis not present

## 2017-02-26 DIAGNOSIS — L7634 Postprocedural seroma of skin and subcutaneous tissue following other procedure: Secondary | ICD-10-CM | POA: Diagnosis not present

## 2017-02-26 DIAGNOSIS — Z9011 Acquired absence of right breast and nipple: Secondary | ICD-10-CM | POA: Diagnosis not present

## 2017-02-26 DIAGNOSIS — C50911 Malignant neoplasm of unspecified site of right female breast: Secondary | ICD-10-CM | POA: Diagnosis not present

## 2017-02-26 DIAGNOSIS — I252 Old myocardial infarction: Secondary | ICD-10-CM | POA: Diagnosis not present

## 2017-02-26 DIAGNOSIS — Z4801 Encounter for change or removal of surgical wound dressing: Secondary | ICD-10-CM | POA: Diagnosis not present

## 2017-03-01 ENCOUNTER — Ambulatory Visit: Payer: Medicare Other | Admitting: Physical Therapy

## 2017-03-05 DIAGNOSIS — C50911 Malignant neoplasm of unspecified site of right female breast: Secondary | ICD-10-CM | POA: Diagnosis not present

## 2017-03-05 DIAGNOSIS — Z4801 Encounter for change or removal of surgical wound dressing: Secondary | ICD-10-CM | POA: Diagnosis not present

## 2017-03-05 DIAGNOSIS — I252 Old myocardial infarction: Secondary | ICD-10-CM | POA: Diagnosis not present

## 2017-03-05 DIAGNOSIS — Z9011 Acquired absence of right breast and nipple: Secondary | ICD-10-CM | POA: Diagnosis not present

## 2017-03-05 DIAGNOSIS — L7634 Postprocedural seroma of skin and subcutaneous tissue following other procedure: Secondary | ICD-10-CM | POA: Diagnosis not present

## 2017-03-05 DIAGNOSIS — L7632 Postprocedural hematoma of skin and subcutaneous tissue following other procedure: Secondary | ICD-10-CM | POA: Diagnosis not present

## 2017-03-12 DIAGNOSIS — I252 Old myocardial infarction: Secondary | ICD-10-CM | POA: Diagnosis not present

## 2017-03-12 DIAGNOSIS — C50919 Malignant neoplasm of unspecified site of unspecified female breast: Secondary | ICD-10-CM | POA: Insufficient documentation

## 2017-03-12 DIAGNOSIS — Z9011 Acquired absence of right breast and nipple: Secondary | ICD-10-CM | POA: Diagnosis not present

## 2017-03-12 DIAGNOSIS — L7632 Postprocedural hematoma of skin and subcutaneous tissue following other procedure: Secondary | ICD-10-CM | POA: Diagnosis not present

## 2017-03-12 DIAGNOSIS — E669 Obesity, unspecified: Secondary | ICD-10-CM | POA: Insufficient documentation

## 2017-03-12 DIAGNOSIS — L7634 Postprocedural seroma of skin and subcutaneous tissue following other procedure: Secondary | ICD-10-CM | POA: Diagnosis not present

## 2017-03-12 DIAGNOSIS — C801 Malignant (primary) neoplasm, unspecified: Secondary | ICD-10-CM | POA: Insufficient documentation

## 2017-03-12 DIAGNOSIS — I10 Essential (primary) hypertension: Secondary | ICD-10-CM | POA: Insufficient documentation

## 2017-03-12 DIAGNOSIS — R32 Unspecified urinary incontinence: Secondary | ICD-10-CM | POA: Insufficient documentation

## 2017-03-12 DIAGNOSIS — Z4801 Encounter for change or removal of surgical wound dressing: Secondary | ICD-10-CM | POA: Diagnosis not present

## 2017-03-12 DIAGNOSIS — I219 Acute myocardial infarction, unspecified: Secondary | ICD-10-CM | POA: Insufficient documentation

## 2017-03-12 DIAGNOSIS — C50911 Malignant neoplasm of unspecified site of right female breast: Secondary | ICD-10-CM | POA: Diagnosis not present

## 2017-03-13 DIAGNOSIS — C50911 Malignant neoplasm of unspecified site of right female breast: Secondary | ICD-10-CM | POA: Diagnosis not present

## 2017-03-13 DIAGNOSIS — Z9011 Acquired absence of right breast and nipple: Secondary | ICD-10-CM | POA: Diagnosis not present

## 2017-03-13 DIAGNOSIS — I119 Hypertensive heart disease without heart failure: Secondary | ICD-10-CM | POA: Diagnosis not present

## 2017-03-13 DIAGNOSIS — Z483 Aftercare following surgery for neoplasm: Secondary | ICD-10-CM | POA: Diagnosis not present

## 2017-03-13 DIAGNOSIS — Z7902 Long term (current) use of antithrombotics/antiplatelets: Secondary | ICD-10-CM | POA: Diagnosis not present

## 2017-03-13 DIAGNOSIS — I252 Old myocardial infarction: Secondary | ICD-10-CM | POA: Diagnosis not present

## 2017-03-13 DIAGNOSIS — M1991 Primary osteoarthritis, unspecified site: Secondary | ICD-10-CM | POA: Diagnosis not present

## 2017-03-13 DIAGNOSIS — K219 Gastro-esophageal reflux disease without esophagitis: Secondary | ICD-10-CM | POA: Diagnosis not present

## 2017-03-13 DIAGNOSIS — Z85850 Personal history of malignant neoplasm of thyroid: Secondary | ICD-10-CM | POA: Diagnosis not present

## 2017-03-16 DIAGNOSIS — K219 Gastro-esophageal reflux disease without esophagitis: Secondary | ICD-10-CM | POA: Diagnosis not present

## 2017-03-16 DIAGNOSIS — C50911 Malignant neoplasm of unspecified site of right female breast: Secondary | ICD-10-CM | POA: Diagnosis not present

## 2017-03-16 DIAGNOSIS — Z483 Aftercare following surgery for neoplasm: Secondary | ICD-10-CM | POA: Diagnosis not present

## 2017-03-16 DIAGNOSIS — Z9011 Acquired absence of right breast and nipple: Secondary | ICD-10-CM | POA: Diagnosis not present

## 2017-03-16 DIAGNOSIS — I252 Old myocardial infarction: Secondary | ICD-10-CM | POA: Diagnosis not present

## 2017-03-16 DIAGNOSIS — I119 Hypertensive heart disease without heart failure: Secondary | ICD-10-CM | POA: Diagnosis not present

## 2017-03-19 ENCOUNTER — Ambulatory Visit: Payer: Medicare Other | Admitting: Physical Therapy

## 2017-03-19 DIAGNOSIS — Z9011 Acquired absence of right breast and nipple: Secondary | ICD-10-CM | POA: Diagnosis not present

## 2017-03-19 DIAGNOSIS — I119 Hypertensive heart disease without heart failure: Secondary | ICD-10-CM | POA: Diagnosis not present

## 2017-03-19 DIAGNOSIS — I252 Old myocardial infarction: Secondary | ICD-10-CM | POA: Diagnosis not present

## 2017-03-19 DIAGNOSIS — C50911 Malignant neoplasm of unspecified site of right female breast: Secondary | ICD-10-CM | POA: Diagnosis not present

## 2017-03-19 DIAGNOSIS — Z483 Aftercare following surgery for neoplasm: Secondary | ICD-10-CM | POA: Diagnosis not present

## 2017-03-19 DIAGNOSIS — K219 Gastro-esophageal reflux disease without esophagitis: Secondary | ICD-10-CM | POA: Diagnosis not present

## 2017-03-21 DIAGNOSIS — Z483 Aftercare following surgery for neoplasm: Secondary | ICD-10-CM | POA: Diagnosis not present

## 2017-03-21 DIAGNOSIS — C50911 Malignant neoplasm of unspecified site of right female breast: Secondary | ICD-10-CM | POA: Diagnosis not present

## 2017-03-21 DIAGNOSIS — K219 Gastro-esophageal reflux disease without esophagitis: Secondary | ICD-10-CM | POA: Diagnosis not present

## 2017-03-21 DIAGNOSIS — I119 Hypertensive heart disease without heart failure: Secondary | ICD-10-CM | POA: Diagnosis not present

## 2017-03-21 DIAGNOSIS — Z9011 Acquired absence of right breast and nipple: Secondary | ICD-10-CM | POA: Diagnosis not present

## 2017-03-21 DIAGNOSIS — I252 Old myocardial infarction: Secondary | ICD-10-CM | POA: Diagnosis not present

## 2017-03-22 DIAGNOSIS — C50911 Malignant neoplasm of unspecified site of right female breast: Secondary | ICD-10-CM | POA: Diagnosis not present

## 2017-03-22 DIAGNOSIS — I252 Old myocardial infarction: Secondary | ICD-10-CM | POA: Diagnosis not present

## 2017-03-22 DIAGNOSIS — Z483 Aftercare following surgery for neoplasm: Secondary | ICD-10-CM | POA: Diagnosis not present

## 2017-03-22 DIAGNOSIS — K219 Gastro-esophageal reflux disease without esophagitis: Secondary | ICD-10-CM | POA: Diagnosis not present

## 2017-03-22 DIAGNOSIS — I119 Hypertensive heart disease without heart failure: Secondary | ICD-10-CM | POA: Diagnosis not present

## 2017-03-22 DIAGNOSIS — Z9011 Acquired absence of right breast and nipple: Secondary | ICD-10-CM | POA: Diagnosis not present

## 2017-03-23 DIAGNOSIS — K219 Gastro-esophageal reflux disease without esophagitis: Secondary | ICD-10-CM | POA: Diagnosis not present

## 2017-03-23 DIAGNOSIS — Z9011 Acquired absence of right breast and nipple: Secondary | ICD-10-CM | POA: Diagnosis not present

## 2017-03-23 DIAGNOSIS — C50911 Malignant neoplasm of unspecified site of right female breast: Secondary | ICD-10-CM | POA: Diagnosis not present

## 2017-03-23 DIAGNOSIS — I252 Old myocardial infarction: Secondary | ICD-10-CM | POA: Diagnosis not present

## 2017-03-23 DIAGNOSIS — Z483 Aftercare following surgery for neoplasm: Secondary | ICD-10-CM | POA: Diagnosis not present

## 2017-03-23 DIAGNOSIS — I119 Hypertensive heart disease without heart failure: Secondary | ICD-10-CM | POA: Diagnosis not present

## 2017-03-26 DIAGNOSIS — Z6827 Body mass index (BMI) 27.0-27.9, adult: Secondary | ICD-10-CM | POA: Diagnosis not present

## 2017-03-26 DIAGNOSIS — I2510 Atherosclerotic heart disease of native coronary artery without angina pectoris: Secondary | ICD-10-CM | POA: Diagnosis not present

## 2017-03-26 DIAGNOSIS — C50919 Malignant neoplasm of unspecified site of unspecified female breast: Secondary | ICD-10-CM | POA: Diagnosis not present

## 2017-03-26 DIAGNOSIS — M25571 Pain in right ankle and joints of right foot: Secondary | ICD-10-CM | POA: Diagnosis not present

## 2017-03-26 DIAGNOSIS — I252 Old myocardial infarction: Secondary | ICD-10-CM | POA: Diagnosis not present

## 2017-03-26 DIAGNOSIS — I119 Hypertensive heart disease without heart failure: Secondary | ICD-10-CM | POA: Diagnosis not present

## 2017-03-26 DIAGNOSIS — E038 Other specified hypothyroidism: Secondary | ICD-10-CM | POA: Diagnosis not present

## 2017-03-26 DIAGNOSIS — M25461 Effusion, right knee: Secondary | ICD-10-CM | POA: Diagnosis not present

## 2017-03-26 DIAGNOSIS — I10 Essential (primary) hypertension: Secondary | ICD-10-CM | POA: Diagnosis not present

## 2017-03-26 DIAGNOSIS — C50911 Malignant neoplasm of unspecified site of right female breast: Secondary | ICD-10-CM | POA: Diagnosis not present

## 2017-03-26 DIAGNOSIS — Z1389 Encounter for screening for other disorder: Secondary | ICD-10-CM | POA: Diagnosis not present

## 2017-03-26 DIAGNOSIS — K589 Irritable bowel syndrome without diarrhea: Secondary | ICD-10-CM | POA: Diagnosis not present

## 2017-03-26 DIAGNOSIS — Z9011 Acquired absence of right breast and nipple: Secondary | ICD-10-CM | POA: Diagnosis not present

## 2017-03-26 DIAGNOSIS — K219 Gastro-esophageal reflux disease without esophagitis: Secondary | ICD-10-CM | POA: Diagnosis not present

## 2017-03-26 DIAGNOSIS — Z483 Aftercare following surgery for neoplasm: Secondary | ICD-10-CM | POA: Diagnosis not present

## 2017-03-26 DIAGNOSIS — E7849 Other hyperlipidemia: Secondary | ICD-10-CM | POA: Diagnosis not present

## 2017-03-29 DIAGNOSIS — I119 Hypertensive heart disease without heart failure: Secondary | ICD-10-CM | POA: Diagnosis not present

## 2017-03-29 DIAGNOSIS — Z483 Aftercare following surgery for neoplasm: Secondary | ICD-10-CM | POA: Diagnosis not present

## 2017-03-29 DIAGNOSIS — C50911 Malignant neoplasm of unspecified site of right female breast: Secondary | ICD-10-CM | POA: Diagnosis not present

## 2017-03-29 DIAGNOSIS — K219 Gastro-esophageal reflux disease without esophagitis: Secondary | ICD-10-CM | POA: Diagnosis not present

## 2017-03-29 DIAGNOSIS — I252 Old myocardial infarction: Secondary | ICD-10-CM | POA: Diagnosis not present

## 2017-03-29 DIAGNOSIS — Z9011 Acquired absence of right breast and nipple: Secondary | ICD-10-CM | POA: Diagnosis not present

## 2017-04-03 DIAGNOSIS — M25551 Pain in right hip: Secondary | ICD-10-CM | POA: Diagnosis not present

## 2017-04-03 DIAGNOSIS — M1711 Unilateral primary osteoarthritis, right knee: Secondary | ICD-10-CM | POA: Insufficient documentation

## 2017-04-04 DIAGNOSIS — K219 Gastro-esophageal reflux disease without esophagitis: Secondary | ICD-10-CM | POA: Diagnosis not present

## 2017-04-04 DIAGNOSIS — I119 Hypertensive heart disease without heart failure: Secondary | ICD-10-CM | POA: Diagnosis not present

## 2017-04-04 DIAGNOSIS — Z483 Aftercare following surgery for neoplasm: Secondary | ICD-10-CM | POA: Diagnosis not present

## 2017-04-04 DIAGNOSIS — C50911 Malignant neoplasm of unspecified site of right female breast: Secondary | ICD-10-CM | POA: Diagnosis not present

## 2017-04-04 DIAGNOSIS — I252 Old myocardial infarction: Secondary | ICD-10-CM | POA: Diagnosis not present

## 2017-04-04 DIAGNOSIS — Z9011 Acquired absence of right breast and nipple: Secondary | ICD-10-CM | POA: Diagnosis not present

## 2017-04-05 ENCOUNTER — Ambulatory Visit (INDEPENDENT_AMBULATORY_CARE_PROVIDER_SITE_OTHER): Payer: Medicare Other | Admitting: Cardiovascular Disease

## 2017-04-05 ENCOUNTER — Encounter: Payer: Self-pay | Admitting: Cardiovascular Disease

## 2017-04-05 VITALS — BP 142/70 | HR 66 | Ht 68.0 in | Wt 180.2 lb

## 2017-04-05 DIAGNOSIS — I2510 Atherosclerotic heart disease of native coronary artery without angina pectoris: Secondary | ICD-10-CM | POA: Diagnosis not present

## 2017-04-05 DIAGNOSIS — Z01810 Encounter for preprocedural cardiovascular examination: Secondary | ICD-10-CM | POA: Diagnosis not present

## 2017-04-05 DIAGNOSIS — E785 Hyperlipidemia, unspecified: Secondary | ICD-10-CM

## 2017-04-05 DIAGNOSIS — I5042 Chronic combined systolic (congestive) and diastolic (congestive) heart failure: Secondary | ICD-10-CM | POA: Diagnosis not present

## 2017-04-05 DIAGNOSIS — E663 Overweight: Secondary | ICD-10-CM | POA: Diagnosis not present

## 2017-04-05 DIAGNOSIS — I10 Essential (primary) hypertension: Secondary | ICD-10-CM

## 2017-04-05 MED ORDER — HYDROCHLOROTHIAZIDE 12.5 MG PO CAPS: 12.5000 mg | ORAL_CAPSULE | ORAL | 3 refills | Status: DC

## 2017-04-05 NOTE — Progress Notes (Signed)
Cardiology onsultation Note    Date:  04/05/2017   ID:  Kathy Murphy, DOB [birth year], MRN [id number]  PCP:  Burnard Bunting, MD  Cardiologist:   Sanda Klein, MD  Consultation requested for: Chest pain Chief Complaint  Patient presents with     . Chest Pain    History of Present Illness:  Kathy Murphy is a 84 y.o. female turning in follow-up roughly 14 months after cardiac catheterization and placement of 2 overlapping drug-eluting stents in the subtotally occluded right coronary artery (2.5 x 38 mm Synergy). She also had high-grade stenoses in the diagonal artery, ramus intermedius branch, distal LAD which were left for medical therapy. Presence of widespread coronary lesions may explain the "false negative" perfusion pattern but with depressed left ventricular systolic function (42%). Upper limit of normal LVEDP at cath.   Last September she underwent lumpectomy without radiation or chemotherapy for right breast cancer (ER+ invasive lobular breast cancer). She is on anastrozole. She did not have any cardiac issues at the time of surgery. She had to undergo repeat surgery for seroma/hematoma has recovered fully. She has a follow-up visit with Dr. Jana Hakim on January 28.  From a cardiac standpoint, she has done well since her last appointment and has not had any problems with shortness of breath or angina pectoris. Activities limited by right knee pain. Despite the 3 injections, she continues have knee pain and is planning to undergo right knee replacement with Dr. Veverly Fells in the near future.  She continues have problems with easy bruising, but no serious bleeding. Her blood pressure has been consistently above 140 when checked at home and is 142 in the office today. She is taking a nonsteroidal anti-inflammatory drug on a daily basis.  Otherwise,she denies dizziness, palpitations, syncope, focal neurological events, leg edema, claudication, angina or dyspnea at rest or with  activity.  Past Medical History:  Diagnosis Date  . Arthritis   . Breast cancer ([place])    Right  . Cancer ([place])    thyroid  . GERD (gastroesophageal reflux disease)   . GI bleed 1980   AFTER POLYP EXCISION  . Heart disease, hypertensive   . Hypercholesterolemia   . Hypertension   . Hypothyroidism   . Incontinence of urine   . Myalgia   . Myocardial infarction ([place])   . Obesity   . Pain in joints   . Retinal detachment   . Unsteady gait   . UTI (urinary tract infection)   . Vomiting     Past Surgical History:  Procedure Laterality Date  . ABDOMINAL HYSTERECTOMY  1977  . BREAST BIOPSY    . BREAST LUMPECTOMY     x5  . CARDIAC CATHETERIZATION N/A 02/08/2016   Procedure: Left Heart Cath and Coronary Angiography;  Surgeon: Belva Crome, MD;  Location: [facility];  Service: Cardiovascular;  Laterality: N/A;  . CARDIAC CATHETERIZATION N/A 02/11/2016   Procedure: Coronary Stent Intervention;  Surgeon: Belva Crome, MD;  Location: [facility];  Service: Cardiovascular;  Laterality: N/A;  . CATARACT EXTRACTION, BILATERAL    . CERVICAL LAMINECTOMY    . [place]  . EVACUATION BREAST HEMATOMA Right 01/09/2017   Procedure: EVACUATION RIGHT MASTECTOMY FLUID COLLECTION;  Surgeon: Rolm Bookbinder, MD;  Location: [facility];  Service: General;  Laterality: Right;  . FOOT SURGERY    . NECK SURGERY    . THYROIDECTOMY  1957  . TOTAL HIP ARTHROPLASTY  2012  left  . TOTAL MASTECTOMY Right 11/27/2016  . TOTAL MASTECTOMY Right 11/27/2016   Procedure: RIGHT TOTAL MASTECTOMY;  Surgeon: Rolm Bookbinder, MD;  Location: [facility];  Service: General;  Laterality: Right;    Current Medications: Outpatient Medications Prior to Visit  Medication Sig Dispense Refill  . anastrozole (ARIMIDEX) 1 MG tablet Take 1 tablet (1 mg total) by mouth daily. 90 tablet 4  . Artificial Tear Ointment (ARTIFICIAL TEARS) ointment Place 1 drop into both eyes 3 (three) times daily as needed  (for dry eyes).     . Diclofenac Sodium CR 100 MG 24 hr tablet Take 100 mg by mouth daily.    Marland Kitchen levothyroxine (SYNTHROID, LEVOTHROID) 125 MCG tablet Take 125 mcg by mouth daily before breakfast.    . losartan (COZAAR) 100 MG tablet Take 100 mg by mouth daily.     . metoprolol succinate (TOPROL-XL) 100 MG 24 hr tablet Take 1 tablet (100 mg total) by mouth daily. Take with or immediately following a meal. 90 tablet 5  . pantoprazole (PROTONIX) 40 MG tablet Take 40 mg by mouth daily.      . rosuvastatin (CRESTOR) 10 MG tablet Take 10 mg by mouth every morning.    . clopidogrel (PLAVIX) 75 MG tablet TAKE 1 TABLET DAILY WITH BREAKFAST 90 tablet 3  . gabapentin (NEURONTIN) 100 MG capsule Take 1 capsule (100 mg total) by mouth at bedtime. (Patient not taking: Reported on 04/05/2017) 30 capsule 6   No facility-administered medications prior to visit.      Allergies:   Other and Latex   Social History   Socioeconomic History  . Marital status: Widowed    Spouse name: None  . Number of children: None  . Years of education: None  . Highest education level: None  Social Needs  . Financial resource strain: None  . Food insecurity - worry: None  . Food insecurity - inability: None  . Transportation needs - medical: None  . Transportation needs - non-medical: None  Occupational History  . None  Tobacco Use  . Smoking status: Never Smoker  . Smokeless tobacco: Never Used  Substance and Sexual Activity  . Alcohol use: No  . Drug use: No  . Sexual activity: None  Other Topics Concern  . None  Social History Narrative  . None     Family History:  The patient's family history includes Heart disease in her mother.   ROS:   Please see the history of present illness.    ROS All other systems reviewed and are negative.   PHYSICAL EXAM:   VS:  BP (!) 142/70 (BP Location: Left Arm, Patient Position: Sitting, Cuff Size: Normal)   Pulse 66   Ht 5\' 8"  (1.727 m)   Wt 180 lb 3.2 oz (81.7 kg)    BMI 27.40 kg/m      General: Alert, oriented x3, no distress, mildly overweight Head: no evidence of trauma, PERRL, EOMI, no exophtalmos or lid lag, no myxedema, no xanthelasma; normal ears, nose and oropharynx Neck: normal jugular venous pulsations and no hepatojugular reflux; brisk carotid pulses without delay and no carotid bruits Chest: clear to auscultation, no signs of consolidation by percussion or palpation, normal fremitus, symmetrical and full respiratory excursions Cardiovascular: normal position and quality of the apical impulse, regular rhythm, normal first and second heart sounds, no murmurs, rubs or gallops Abdomen: no tenderness or distention, no masses by palpation, no abnormal pulsatility or arterial bruits, normal bowel sounds, no hepatosplenomegaly Extremities:  no clubbing, cyanosis or edema; 2+ radial, ulnar and brachial pulses bilaterally; 2+ right femoral, posterior tibial and dorsalis pedis pulses; 2+ left femoral, posterior tibial and dorsalis pedis pulses; no subclavian or femoral bruits Neurological: grossly nonfocal Psych: Normal mood and affect  Psych: euthymic mood, full affect  Wt Readings from Last 3 Encounters:  04/05/17 180 lb 3.2 oz (81.7 kg)  01/22/17 182 lb 4.8 oz (82.7 kg)  01/09/17 184 lb 15.5 oz (83.9 kg)      Studies/Labs Reviewed:   EKG:  EKG is ordered today. Shows normal sinus rhythm with first-degree AV block (PR interval 260 ms), possible left atrial enlargement, left ventricular hypertrophy without significant repolarization abnormalities. QTC 438 ms  Labs: Lipid Panel     Component Value Date/Time   CHOL 136 04/10/2016 0857   TRIG 67 04/10/2016 0857   HDL 53 04/10/2016 0857   CHOLHDL 2.6 04/10/2016 0857   VLDL 13 04/10/2016 0857   LDLCALC 70 04/10/2016 0857   BMET    Component Value Date/Time   NA 140 01/22/2017 0946   K 4.1 01/22/2017 0946   CL 103 01/10/2017 0643   CO2 29 01/22/2017 0946   GLUCOSE 101 01/22/2017 0946    BUN 17.9 01/22/2017 0946   CREATININE 0.8 01/22/2017 0946   CALCIUM 9.1 01/22/2017 0946   GFRNONAA >60 01/10/2017 0643   GFRAA >60 01/10/2017 3500     ASSESSMENT:    1. CHF (congestive heart failure), NYHA class I, chronic, combined ([place])   2. Coronary artery disease involving native coronary artery of native heart without angina pectoris   3. Dyslipidemia   4. Essential hypertension   5. Overweight   6. Preoperative cardiovascular examination      PLAN:  In order of problems listed above:  1. CHF: EF was 40-45 % before her revascularization. From a clinical standpoint she does not have congestive heart failure and she is euvolemic without the use of loop diuretics. She is on an ARB and beta blocker. I wonder if her ejection fraction has improved following revascularization. Recommended an echocardiogram. I don't think she needs to wait for the result of her echo to have her knee surgery though. Nuclear stress test was not helpful for diagnosis before her cardiac catheterization. I see little reason to perform such a study for risk stratification. 2. CAD: Over a year has passed since revascularization. Asymptomatic. Discontinue clopidogrel and switch to low-dose aspirin, hopefully with less bruising. It will also help with scheduling of a right total knee replacement. 3. HLP: Target LDL under 70. Time to recheck. 4. HTN: Imperfect control. Add low-dose intermittent thiazide diuretic and recheck blood pressure in a couple of weeks. 5. Overweight: She has made a little bit of progress with weight loss. 6. Preop eval: She is asymptomatic. Resting EKG does not show ischemia. I believe she is at low risk for major complications with orthopedic surgery. It's important that her metoprolol is not discontinued during the perioperative period.    Medication Adjustments/Labs and Tests Ordered: Current medicines are reviewed at length with the patient today.  Concerns regarding medicines are  outlined above.  Medication changes, Labs and Tests ordered today are listed in the Patient Instructions below. Patient Instructions  Medication Instructions: Dr Sallyanne Kuster has recommended making the following medication changes: 1. START Hydrochlorothiazide 12.5 mg - take 1 tablet by mouth 3 times weekly (Mondays, Wednesdays, and Fridays) 2. STOP Clopidogrel 3. START Aspirin 81 mg - take 1 tablet by mouth daily  Labwork:  Your physician recommends that you return for lab work at your convenience - FASTING.  Testing/Procedures: 1. Echocardiogram - Your physician has requested that you have an echocardiogram. Echocardiography is a painless test that uses sound waves to create images of your heart. It provides your doctor with information about the size and shape of your heart and how well your heart's chambers and valves are working. This procedure takes approximately one hour. There are no restrictions for this procedure. >>This will be performed at our [facility] location - [address]  Follow-up: Dr Sallyanne Kuster recommends that you schedule a follow-up appointment in 12 months. You will receive a reminder letter in the mail two months in advance. If you don't receive a letter, please call our office to schedule the follow-up appointment.  If you need a refill on your cardiac medications before your next appointment, please call your pharmacy.    Signed, Sanda Klein, MD  04/05/2017 6:05 PM    [place], [place], [place]  [address] Phone: [phone]; Fax: [phone]

## 2017-04-05 NOTE — Patient Instructions (Signed)
Medication Instructions: Dr Sallyanne Kuster has recommended making the following medication changes: 1. START Hydrochlorothiazide 12.5 mg - take 1 tablet by mouth 3 times weekly (Mondays, Wednesdays, and Fridays) 2. STOP Clopidogrel 3. START Aspirin 81 mg - take 1 tablet by mouth daily  Labwork: Your physician recommends that you return for lab work at your convenience - FASTING.  Testing/Procedures: 1. Echocardiogram - Your physician has requested that you have an echocardiogram. Echocardiography is a painless test that uses sound waves to create images of your heart. It provides your doctor with information about the size and shape of your heart and how well your heart's chambers and valves are working. This procedure takes approximately one hour. There are no restrictions for this procedure. >>This will be performed at our [facility] location - [address]  Follow-up: Dr Sallyanne Kuster recommends that you schedule a follow-up appointment in 12 months. You will receive a reminder letter in the mail two months in advance. If you don't receive a letter, please call our office to schedule the follow-up appointment.  If you need a refill on your cardiac medications before your next appointment, please call your pharmacy.

## 2017-04-06 ENCOUNTER — Telehealth: Payer: Self-pay | Admitting: Cardiovascular Disease

## 2017-04-06 DIAGNOSIS — K219 Gastro-esophageal reflux disease without esophagitis: Secondary | ICD-10-CM | POA: Diagnosis not present

## 2017-04-06 DIAGNOSIS — I119 Hypertensive heart disease without heart failure: Secondary | ICD-10-CM | POA: Diagnosis not present

## 2017-04-06 DIAGNOSIS — C50911 Malignant neoplasm of unspecified site of right female breast: Secondary | ICD-10-CM | POA: Diagnosis not present

## 2017-04-06 DIAGNOSIS — Z483 Aftercare following surgery for neoplasm: Secondary | ICD-10-CM | POA: Diagnosis not present

## 2017-04-06 DIAGNOSIS — I252 Old myocardial infarction: Secondary | ICD-10-CM | POA: Diagnosis not present

## 2017-04-06 DIAGNOSIS — Z9011 Acquired absence of right breast and nipple: Secondary | ICD-10-CM | POA: Diagnosis not present

## 2017-04-06 NOTE — Telephone Encounter (Signed)
   Primary Cardiologist: Sanda Klein, MD  Chart reviewed as part of pre-operative protocol coverage. Dr. Sallyanne Kuster evaluated the patient yesterday and cleared her to proceed with surgery without further testing and also switched her from Plavix to aspirin. I will send this to Dr. Sallyanne Kuster for final input on whether he wishes for patient to remain on aspirin or how long he is OK with holding prior to surgery. Dr. Loletha Grayer, please route reply to P CV DIV PREOP. Thanks.  Charlie Pitter, PA-C 04/06/2017, 2:52 PM

## 2017-04-06 NOTE — Telephone Encounter (Signed)
I would like her to stay on ASA (and suspect her surgeon will be OK with that). MCr

## 2017-04-06 NOTE — Telephone Encounter (Signed)
   Primary Cardiologist: Sanda Klein, MD  To recap recommendations, Dr. Sallyanne Kuster evaluated the patient yesterday and cleared her to proceed with surgery without further testing. He states he would like her to stay on ASA (and he suspects her surgeon will be OK with that).  Will route this bundled recommendation to requesting provider via Epic fax function. Please call with questions.  Charlie Pitter, PA-C 04/06/2017, 4:53 PM

## 2017-04-06 NOTE — Telephone Encounter (Signed)
   [facility] Pre-operative Risk Assessment    Request for surgical clearance:  1. What type of surgery is being performed? Right total knee   2. When is this surgery scheduled? TBS   3. What type of clearance is required (medical clearance vs. Pharmacy clearance to hold med vs. Both)? BOTH  4. Are there any medications that need to be held prior to surgery and how long? Not specified - per MD note, plavix was discontinued 04/05/17 and she should have started aspirin 80m daily   5. Practice name and name of physician performing surgery? Dr. [email or web address]   6. What is your office phone and fax number? (p) [phone](direct line for LSantiago Bur  (f) [phone]  7. Anesthesia type (None, local, MAC, general) ? General/femoral nerve block  ** request to include: most recent office notes, testing/labs/EKG   EFidel Levy1/25/2019, 11:46 AM  _________________________________________________________________   (provider comments below)

## 2017-04-09 ENCOUNTER — Telehealth: Payer: Self-pay | Admitting: Oncology

## 2017-04-09 ENCOUNTER — Inpatient Hospital Stay: Payer: Medicare Other

## 2017-04-09 ENCOUNTER — Inpatient Hospital Stay: Payer: Medicare Other | Attending: Oncology | Admitting: Oncology

## 2017-04-09 VITALS — BP 135/50 | HR 68 | Temp 98.4°F | Resp 18 | Ht 68.0 in | Wt 180.6 lb

## 2017-04-09 DIAGNOSIS — I2129 ST elevation (STEMI) myocardial infarction involving other sites: Secondary | ICD-10-CM

## 2017-04-09 DIAGNOSIS — Z170 Estrogen receptor positive status [ER+]: Secondary | ICD-10-CM | POA: Diagnosis not present

## 2017-04-09 DIAGNOSIS — I5042 Chronic combined systolic (congestive) and diastolic (congestive) heart failure: Secondary | ICD-10-CM

## 2017-04-09 DIAGNOSIS — Z79811 Long term (current) use of aromatase inhibitors: Secondary | ICD-10-CM | POA: Diagnosis not present

## 2017-04-09 DIAGNOSIS — Z9011 Acquired absence of right breast and nipple: Secondary | ICD-10-CM

## 2017-04-09 DIAGNOSIS — C50411 Malignant neoplasm of upper-outer quadrant of right female breast: Secondary | ICD-10-CM

## 2017-04-09 DIAGNOSIS — M8588 Other specified disorders of bone density and structure, other site: Secondary | ICD-10-CM | POA: Diagnosis not present

## 2017-04-09 DIAGNOSIS — I200 Unstable angina: Secondary | ICD-10-CM

## 2017-04-09 LAB — COMPREHENSIVE METABOLIC PANEL
ALBUMIN: 3.7 g/dL (ref 3.5–5.0)
ALK PHOS: 93 U/L (ref 40–150)
ALT: 6 U/L (ref 0–55)
ANION GAP: 8 (ref 3–11)
AST: 11 U/L (ref 5–34)
BILIRUBIN TOTAL: 0.4 mg/dL (ref 0.2–1.2)
BUN: 25 mg/dL (ref 7–26)
CALCIUM: 8.9 mg/dL (ref 8.4–10.4)
CO2: 28 mmol/L (ref 22–29)
Chloride: 105 mmol/L (ref 98–109)
Creatinine, Ser: 0.99 mg/dL (ref 0.60–1.10)
GFR calc Af Amer: 58 mL/min — ABNORMAL LOW (ref >60)
GFR, EST NON AFRICAN AMERICAN: 50 mL/min — AB (ref >60)
GLUCOSE: 136 mg/dL (ref 70–140)
POTASSIUM: 3.6 mmol/L (ref 3.3–4.7)
Sodium: 141 mmol/L (ref 136–145)
TOTAL PROTEIN: 6.6 g/dL (ref 6.4–8.3)

## 2017-04-09 LAB — CBC WITH DIFFERENTIAL/PLATELET
Basophils Absolute: 0 K/uL (ref 0.0–0.1)
Basophils Relative: 0 %
Eosinophils Absolute: 0.2 K/uL (ref 0.0–0.5)
Eosinophils Relative: 3 %
HCT: 33.6 % — ABNORMAL LOW (ref 34.8–46.6)
Hemoglobin: 10.6 g/dL — ABNORMAL LOW (ref 11.6–15.9)
Lymphocytes Relative: 19 %
Lymphs Abs: 1.3 K/uL (ref 0.9–3.3)
MCH: 27.1 pg (ref 25.1–34.0)
MCHC: 31.7 g/dL (ref 31.5–36.0)
MCV: 85.5 fL (ref 79.5–101.0)
Monocytes Absolute: 0.6 K/uL (ref 0.1–0.9)
Monocytes Relative: 9 %
Neutro Abs: 4.5 K/uL (ref 1.5–6.5)
Neutrophils Relative %: 69 %
Platelets: 174 K/uL (ref 145–400)
RBC: 3.93 MIL/uL (ref 3.70–5.45)
RDW: 15.3 % (ref 11.2–16.1)
WBC: 6.6 K/uL (ref 3.9–10.3)

## 2017-04-09 NOTE — Telephone Encounter (Signed)
Gave patient AVs and calendar of upcoming October appointments.  °

## 2017-04-09 NOTE — Progress Notes (Signed)
[place]  [phone] [phone]     ID: Kathy Murphy DOB: [birth year]  MR#: [id number]  TDV#:[id number]  Patient Care Team: Burnard Bunting, MD as PCP - General (Internal Medicine) Viyaan Champine, Virgie Dad, MD as Consulting Physician (Oncology) Rolm Bookbinder, MD as Consulting Physician (General Surgery) Croitoru, Dani Gobble, MD as Consulting Physician (Cardiology) Croitoru, Dani Gobble, MD as Consulting Physician (Cardiology) Netta Cedars, MD as Consulting Physician (Orthopedic Surgery) OTHER MD:  CHIEF COMPLAINT: Estrogen receptor positive invasive lobular breast cancer  CURRENT TREATMENT: Anastrozole   BREAST CANCER HISTORY: From the original intake note:  Nafeesah had a change in her right breast and was referred to [facility] where on 04/14/2016 bilateral diagnostic mammography with tomography and right breast ultrasound was obtained. The breast density was category B. In the central right breast there was an oval mass with no other findings of concern. Ultrasound located a benign 0.3 cm cyst in the upper inner quadrant of the right breast correlating with the mammography findings. Routine mammography was recommended for one year.  However with further changes in the right breast note by the patient and her primary care MD, repeat right diagnostic mammography with tomography and repeat right breast ultrasonography 08/21/2016 now found a 3 cm area of asymmetry in the upper outer right breast which on physical exam measured approximately 2-1/2 cm at the 10:00 location 8 cm from the nipple. Ultrasound of this area showed a 0.5 cm hypoechoic mass with a larger 3 cm area of hazy isoechoic tissue with abnormal architecture. The right axilla showed 2 normal-appearing lymph nodes.  Biopsy of the right breast upper outer quadrant mass 08/28/2016 showed ([id number]) an invasive lobular carcinoma, E-cadherin negative, estrogen receptor 85% positive, progesterone receptor  100% positive, both with strong staining intensity, with an MIB-1 of 5%, and no HER-2 implication, the signals ratio being 1.49 and the number per cell 3.21.  The patient's subsequent history is as detailed below.  INTERVAL HISTORY: Kathy Murphy returns today for follow-up and treatment of her estrogen receptor positive breast cancer accompanied by a friend, [name].  She continues on anastrozole, with good tolerance. She notes that she gets hot flashes. She notes that she has not taken the Neurontin, because she doesn't want to take anything to make her sleepy.   REVIEW OF SYSTEMS: [name] reports that she is getting a right knee replacement under Dr. Netta Cedars. She notes that she fell while using her walker on 02/16/2017. She notes that her knee has been weak and it is more difficult to get around her house. She notes that she has only been taking tylenol for the pain, but it hasn't been helping her. She reports that she is in pain about 90% of the time. She notes that she was on a blood thinner and is not able to take Alive. She notes that she sees her cardiologist Dr. Sallyanne Kuster on 04/12/2017. She notes that her husband passed away 3 years ago and she has considered making adjustments to her house to improve her accessibility.  She denies unusual headaches, visual changes, nausea, vomiting, or dizziness. There has been no unusual cough, phlegm production, or pleurisy. This been no change in bowel or bladder habits. She denies unexplained fatigue or unexplained weight loss, bleeding, rash, or fever. A detailed review of systems was otherwise stable.    PAST MEDICAL HISTORY: Past Medical History:  Diagnosis Date  . Arthritis   . Breast cancer ([place])    Right  . Cancer ([place])  thyroid  . GERD (gastroesophageal reflux disease)   . GI bleed 1980   AFTER POLYP EXCISION  . Heart disease, hypertensive   . Hypercholesterolemia   . Hypertension   . Hypothyroidism   . Incontinence of urine   . Myalgia   .  Myocardial infarction ([place])   . Obesity   . Pain in joints   . Retinal detachment   . Unsteady gait   . UTI (urinary tract infection)   . Vomiting     PAST SURGICAL HISTORY: Past Surgical History:  Procedure Laterality Date  . ABDOMINAL HYSTERECTOMY  1977  . BREAST BIOPSY    . BREAST LUMPECTOMY     x5  . CARDIAC CATHETERIZATION N/A 02/08/2016   Procedure: Left Heart Cath and Coronary Angiography;  Surgeon: Belva Crome, MD;  Location: [place] [facility];  Service: Cardiovascular;  Laterality: N/A;  . CARDIAC CATHETERIZATION N/A 02/11/2016   Procedure: Coronary Stent Intervention;  Surgeon: Belva Crome, MD;  Location: [place] [facility];  Service: Cardiovascular;  Laterality: N/A;  . CATARACT EXTRACTION, BILATERAL    . CERVICAL LAMINECTOMY    . [place]  . EVACUATION BREAST HEMATOMA Right 01/09/2017   Procedure: EVACUATION RIGHT MASTECTOMY FLUID COLLECTION;  Surgeon: Rolm Bookbinder, MD;  Location: [place];  Service: General;  Laterality: Right;  . FOOT SURGERY    . NECK SURGERY    . THYROIDECTOMY  1957  . TOTAL HIP ARTHROPLASTY  2010/04/26   left  . TOTAL MASTECTOMY Right 11/27/2016  . TOTAL MASTECTOMY Right 11/27/2016   Procedure: RIGHT TOTAL MASTECTOMY;  Surgeon: Rolm Bookbinder, MD;  Location: [place];  Service: General;  Laterality: Right;    FAMILY HISTORY Family History  Problem Relation Age of Onset  . Heart disease Mother   The patient has little information regarding her father. Her mother died at age 24 in a nursing home from what the patient thinks may have been heart disease. The patient had no brothers and no sisters.  GYNECOLOGIC HISTORY:  No LMP recorded. Patient is postmenopausal. She thinks her first menstrual period may have been age 76. She never carried a child to term. She underwent hysterectomy with bilateral salpingo-oophorectomy in her 19s. She was on estrogen replacement until July 2018  SOCIAL HISTORY:  She is a retired Glass blower/designer  for [employer]. Her husband died in April 26, 2012. She lives by herself, with no pets, in a fairly large house on [place] acres she says. She does much of the house work herself. She also pays all her bills and keeps all her accounts.    ADVANCED DIRECTIVES: In place. She has named her close friend [name] as her healthcare power of attorney. He can be reached at (cell) [phone] or (home) [phone]   HEALTH MAINTENANCE: Social History   Tobacco Use  . Smoking status: Never Smoker  . Smokeless tobacco: Never Used  Substance Use Topics  . Alcohol use: No  . Drug use: No     Colonoscopy: November 2011/[facility]  PAP: April 26, 2014  Bone density: 10/04/2016 at [facility] showed a T score of -1.4 osteopenic   Allergies  Allergen Reactions  . Other Other (See Comments)    SOME OF THE "MYCINS"...UNKNOWN REACTION.  Marland Kitchen Latex Rash    Current Outpatient Medications  Medication Sig Dispense Refill  . anastrozole (ARIMIDEX) 1 MG tablet Take 1 tablet (1 mg total) by mouth daily. 90 tablet 4  . Artificial Tear Ointment (ARTIFICIAL TEARS) ointment Place 1  drop into both eyes 3 (three) times daily as needed (for dry eyes).     . Diclofenac Sodium CR 100 MG 24 hr tablet Take 100 mg by mouth daily.    Marland Kitchen gabapentin (NEURONTIN) 100 MG capsule Take 1 capsule (100 mg total) by mouth at bedtime. (Patient not taking: Reported on 04/05/2017) 30 capsule 6  . hydrochlorothiazide (MICROZIDE) 12.5 MG capsule Take 1 capsule (12.5 mg total) by mouth 3 (three) times a week. 45 capsule 3  . levothyroxine (SYNTHROID, LEVOTHROID) 125 MCG tablet Take 125 mcg by mouth daily before breakfast.    . losartan (COZAAR) 100 MG tablet Take 100 mg by mouth daily.     . metoprolol succinate (TOPROL-XL) 100 MG 24 hr tablet Take 1 tablet (100 mg total) by mouth daily. Take with or immediately following a meal. 90 tablet 5  . pantoprazole (PROTONIX) 40 MG tablet Take 40 mg by mouth daily.      . rosuvastatin (CRESTOR) 10 MG tablet Take 10  mg by mouth every morning.     No current facility-administered medications for this visit.     OBJECTIVE: Older white woman examined in a wheelchair  Vitals:   04/09/17 1451  BP: (!) 135/50  Pulse: 68  Resp: 18  Temp: 98.4 F (36.9 C)  SpO2: 100%     Body mass index is 27.46 kg/m.   Wt Readings from Last 3 Encounters:  04/09/17 180 lb 9.6 oz (81.9 kg)  04/05/17 180 lb 3.2 oz (81.7 kg)  01/22/17 182 lb 4.8 oz (82.7 kg)      ECOG FS:2 - Symptomatic, <50% confined to bed  Sclerae unicteric, EOMs intact No cervical or supraclavicular adenopathy Lungs no rales or rhonchi Heart regular rate and rhythm Abd soft, nontender, positive bowel sounds MSK no focal spinal tenderness, no upper extremity lymphedema; very limited range of motion right knee secondary to pain Neuro: nonfocal, well oriented, appropriate affect Breasts: The right breast has undergone mastectomy.  The incision is somewhat irregular.  There is no erythema, dehiscence, swelling, or evidence of local chest wall recurrence.  Left breast is benign.  Both axillae are benign.  LAB RESULTS:  CMP     Component Value Date/Time   NA 141 04/09/2017 1427   NA 140 01/22/2017 0946   K 3.6 04/09/2017 1427   K 4.1 01/22/2017 0946   CL 105 04/09/2017 1427   CO2 28 04/09/2017 1427   CO2 29 01/22/2017 0946   GLUCOSE 136 04/09/2017 1427   GLUCOSE 101 01/22/2017 0946   BUN 25 04/09/2017 1427   BUN 17.9 01/22/2017 0946   CREATININE 0.99 04/09/2017 1427   CREATININE 0.8 01/22/2017 0946   CALCIUM 8.9 04/09/2017 1427   CALCIUM 9.1 01/22/2017 0946   PROT 6.6 04/09/2017 1427   PROT 6.5 01/22/2017 0946   ALBUMIN 3.7 04/09/2017 1427   ALBUMIN 3.1 (L) 01/22/2017 0946   AST 11 04/09/2017 1427   AST 10 01/22/2017 0946   ALT 6 04/09/2017 1427   ALT 12 01/22/2017 0946   ALKPHOS 93 04/09/2017 1427   ALKPHOS 92 01/22/2017 0946   BILITOT 0.4 04/09/2017 1427   BILITOT 0.37 01/22/2017 0946   GFRNONAA 50 (L) 04/09/2017 1427    GFRAA 58 (L) 04/09/2017 1427    No results found for: TOTALPROTELP, ALBUMINELP, A1GS, A2GS, BETS, BETA2SER, GAMS, MSPIKE, SPEI  No results found for: KPAFRELGTCHN, LAMBDASER, KAPLAMBRATIO  Lab Results  Component Value Date   WBC 6.6 04/09/2017   NEUTROABS 4.5 04/09/2017  HGB 10.6 (L) 04/09/2017   HCT 33.6 (L) 04/09/2017   MCV 85.5 04/09/2017   PLT 174 04/09/2017      Chemistry      Component Value Date/Time   NA 141 04/09/2017 1427   NA 140 01/22/2017 0946   K 3.6 04/09/2017 1427   K 4.1 01/22/2017 0946   CL 105 04/09/2017 1427   CO2 28 04/09/2017 1427   CO2 29 01/22/2017 0946   BUN 25 04/09/2017 1427   BUN 17.9 01/22/2017 0946   CREATININE 0.99 04/09/2017 1427   CREATININE 0.8 01/22/2017 0946      Component Value Date/Time   CALCIUM 8.9 04/09/2017 1427   CALCIUM 9.1 01/22/2017 0946   ALKPHOS 93 04/09/2017 1427   ALKPHOS 92 01/22/2017 0946   AST 11 04/09/2017 1427   AST 10 01/22/2017 0946   ALT 6 04/09/2017 1427   ALT 12 01/22/2017 0946   BILITOT 0.4 04/09/2017 1427   BILITOT 0.37 01/22/2017 0946       No results found for: LABCA2  No components found for: TDVVOH607  No results for input(s): INR in the last 168 hours.  Urinalysis    Component Value Date/Time   COLORURINE YELLOW 12/26/2012 0843   APPEARANCEUR CLOUDY (A) 12/26/2012 0843   LABSPEC 1.019 12/26/2012 0843   PHURINE 6.0 12/26/2012 0843   GLUCOSEU NEGATIVE 12/26/2012 0843   HGBUR SMALL (A) 12/26/2012 0843   BILIRUBINUR NEGATIVE 12/26/2012 0843   KETONESUR NEGATIVE 12/26/2012 0843   PROTEINUR NEGATIVE 12/26/2012 0843   UROBILINOGEN 0.2 12/26/2012 0843   NITRITE NEGATIVE 12/26/2012 0843   LEUKOCYTESUR NEGATIVE 12/26/2012 0843     STUDIES: She is already scheduled for left mammography 04/16/2017  ELIGIBLE FOR AVAILABLE RESEARCH PROTOCOL: No  ASSESSMENT: 84 y.o. [place], [place] woman status post right breast upper outer quadrant biopsy 08/28/2016 for a clinical T2 N0, stage IB invasive  lobular carcinoma, grade not stated, estrogen and progesterone strongly positive, HER-2 nonamplified, with an MIB-1 of 5%.  (1) anastrozole started 09/25/2016  (a) bone density on 10/04/2016 at [facility] showed a T score of -1.4 osteopenic  (2) Status post right mastectomy without sentinel lymph node sampling 11/27/2016 for a pT2 cN0, stage IB invasive lobular carcinoma, grade 1, with negative margins.  (3) no adjuvant radiation indicated  PLAN: Cyani is now 4 months out from definitive surgery for her breast cancer.  There is no evidence of chest wall recurrence.  She is tolerating anastrozole well.  The plan will be to continue that for a total of 5 years.  Bone density shows mild osteopenia.  That will need to be repeated in July 2020.  She understands that anastrozole does not cause or prevent clotting or bleeding.  Therefore he can be continued right through surgery and in the postop period without any concerns in that regard  She will see me again in October.  She knows to call for any problems that may develop before that visit. Chauncey Cruel, MD 04/09/17 3:14 PM  Medical Oncology and Hematology [facility] [address] [place], [place] [address] Tel. [phone] Fax. [phone]  This document serves as a record of services personally performed by Lurline Del, MD. It was created on his behalf by Sheron Nightingale, a trained medical scribe. The creation of this record is based on the scribe's personal observations and the provider's statements to them.   I have reviewed the above documentation for accuracy and completeness, and I agree with the above.

## 2017-04-11 DIAGNOSIS — Z9011 Acquired absence of right breast and nipple: Secondary | ICD-10-CM | POA: Diagnosis not present

## 2017-04-11 DIAGNOSIS — K219 Gastro-esophageal reflux disease without esophagitis: Secondary | ICD-10-CM | POA: Diagnosis not present

## 2017-04-11 DIAGNOSIS — Z483 Aftercare following surgery for neoplasm: Secondary | ICD-10-CM | POA: Diagnosis not present

## 2017-04-11 DIAGNOSIS — I252 Old myocardial infarction: Secondary | ICD-10-CM | POA: Diagnosis not present

## 2017-04-11 DIAGNOSIS — C50911 Malignant neoplasm of unspecified site of right female breast: Secondary | ICD-10-CM | POA: Diagnosis not present

## 2017-04-11 DIAGNOSIS — I119 Hypertensive heart disease without heart failure: Secondary | ICD-10-CM | POA: Diagnosis not present

## 2017-04-12 ENCOUNTER — Other Ambulatory Visit: Payer: Self-pay | Admitting: Oncology

## 2017-04-13 ENCOUNTER — Ambulatory Visit (HOSPITAL_COMMUNITY): Payer: Medicare Other | Attending: Cardiology

## 2017-04-13 ENCOUNTER — Other Ambulatory Visit: Payer: Self-pay

## 2017-04-13 DIAGNOSIS — I252 Old myocardial infarction: Secondary | ICD-10-CM | POA: Diagnosis not present

## 2017-04-13 DIAGNOSIS — I5042 Chronic combined systolic (congestive) and diastolic (congestive) heart failure: Secondary | ICD-10-CM | POA: Diagnosis not present

## 2017-04-13 DIAGNOSIS — C50911 Malignant neoplasm of unspecified site of right female breast: Secondary | ICD-10-CM | POA: Diagnosis not present

## 2017-04-13 DIAGNOSIS — K219 Gastro-esophageal reflux disease without esophagitis: Secondary | ICD-10-CM | POA: Diagnosis not present

## 2017-04-13 DIAGNOSIS — Z483 Aftercare following surgery for neoplasm: Secondary | ICD-10-CM | POA: Diagnosis not present

## 2017-04-13 DIAGNOSIS — Z9011 Acquired absence of right breast and nipple: Secondary | ICD-10-CM | POA: Diagnosis not present

## 2017-04-13 DIAGNOSIS — I119 Hypertensive heart disease without heart failure: Secondary | ICD-10-CM | POA: Diagnosis not present

## 2017-04-16 DIAGNOSIS — Z1231 Encounter for screening mammogram for malignant neoplasm of breast: Secondary | ICD-10-CM | POA: Diagnosis not present

## 2017-04-16 DIAGNOSIS — E785 Hyperlipidemia, unspecified: Secondary | ICD-10-CM | POA: Diagnosis not present

## 2017-04-16 DIAGNOSIS — Z853 Personal history of malignant neoplasm of breast: Secondary | ICD-10-CM | POA: Diagnosis not present

## 2017-04-16 LAB — LIPID PANEL
CHOL/HDL RATIO: 2.6 ratio (ref 0.0–4.4)
Cholesterol, Total: 116 mg/dL (ref 100–199)
HDL: 45 mg/dL (ref >39)
LDL CALC: 57 mg/dL (ref 0–99)
Triglycerides: 70 mg/dL (ref 0–149)
VLDL CHOLESTEROL CAL: 14 mg/dL (ref 5–40)

## 2017-04-24 ENCOUNTER — Other Ambulatory Visit: Payer: Self-pay | Admitting: Cardiovascular Disease

## 2017-04-24 NOTE — Telephone Encounter (Signed)
REFILL 

## 2017-04-30 DIAGNOSIS — M1711 Unilateral primary osteoarthritis, right knee: Secondary | ICD-10-CM | POA: Diagnosis not present

## 2017-05-01 NOTE — H&P (Signed)
Kathy Murphy is an 84 y.o. female.    Chief Complaint: right knee pain  HPI: Pt is a 84 y.o. female complaining of right knee pain for multiple years. Pain had continually increased since the beginning. X-rays in the clinic show end-stage arthritic changes of the right knee. Pt has tried various conservative treatments which have failed to alleviate their symptoms, including injections and therapy. Various options are discussed with the patient. Risks, benefits and expectations were discussed with the patient. Patient understand the risks, benefits and expectations and wishes to proceed with surgery.   PCP:  Burnard Bunting, MD  D/C Plans: Home  PMH: Past Medical History:  Diagnosis Date  . Arthritis   . Breast cancer ([place])    Right  . Cancer ([place])    thyroid  . GERD (gastroesophageal reflux disease)   . GI bleed 1980   AFTER POLYP EXCISION  . Heart disease, hypertensive   . Hypercholesterolemia   . Hypertension   . Hypothyroidism   . Incontinence of urine   . Myalgia   . Myocardial infarction ([place])   . Obesity   . Pain in joints   . Retinal detachment   . Unsteady gait   . UTI (urinary tract infection)   . Vomiting     PSH: Past Surgical History:  Procedure Laterality Date  . ABDOMINAL HYSTERECTOMY  1977  . BREAST BIOPSY    . BREAST LUMPECTOMY     x5  . CARDIAC CATHETERIZATION N/A 02/08/2016   Procedure: Left Heart Cath and Coronary Angiography;  Surgeon: Belva Crome, MD;  Location: [place] [facility];  Service: Cardiovascular;  Laterality: N/A;  . CARDIAC CATHETERIZATION N/A 02/11/2016   Procedure: Coronary Stent Intervention;  Surgeon: Belva Crome, MD;  Location: [place] [facility];  Service: Cardiovascular;  Laterality: N/A;  . CATARACT EXTRACTION, BILATERAL    . CERVICAL LAMINECTOMY    . [place]  . EVACUATION BREAST HEMATOMA Right 01/09/2017   Procedure: EVACUATION RIGHT MASTECTOMY FLUID COLLECTION;  Surgeon: Rolm Bookbinder, MD;   Location: [place];  Service: General;  Laterality: Right;  . FOOT SURGERY    . NECK SURGERY    . THYROIDECTOMY  1957  . TOTAL HIP ARTHROPLASTY  2012   left  . TOTAL MASTECTOMY Right 11/27/2016  . TOTAL MASTECTOMY Right 11/27/2016   Procedure: RIGHT TOTAL MASTECTOMY;  Surgeon: Rolm Bookbinder, MD;  Location: [place];  Service: General;  Laterality: Right;    Social History:  reports that  has never smoked. she has never used smokeless tobacco. She reports that she does not drink alcohol or use drugs.  Allergies:  Allergies  Allergen Reactions  . Other Other (See Comments)    SOME OF THE "MYCINS"...UNKNOWN REACTION.  Marland Kitchen Latex Rash    Medications: No current facility-administered medications for this encounter.    Current Outpatient Medications  Medication Sig Dispense Refill  . acetaminophen (TYLENOL) 650 MG CR tablet Take 1,300 mg by mouth every 8 (eight) hours as needed for pain.    Marland Kitchen anastrozole (ARIMIDEX) 1 MG tablet Take 1 tablet (1 mg total) by mouth daily. 90 tablet 4  . aspirin EC 81 MG tablet Take 81 mg by mouth daily.    . Carboxymethylcellulose Sodium (REFRESH TEARS OP) Apply 1 drop to eye daily.    . Diclofenac Sodium CR 100 MG 24 hr tablet Take 100 mg by mouth daily.    . hydrochlorothiazide (MICROZIDE) 12.5 MG capsule Take 1  capsule (12.5 mg total) by mouth 3 (three) times a week. 45 capsule 3  . levothyroxine (SYNTHROID, LEVOTHROID) 125 MCG tablet Take 125 mcg by mouth daily before breakfast.    . losartan (COZAAR) 100 MG tablet Take 100 mg by mouth daily.     . metoprolol succinate (TOPROL-XL) 100 MG 24 hr tablet TAKE 1 TABLET DAILY. TAKE WITH OR IMMEDIATELY FOLLOWING A MEAL 90 tablet 3  . pantoprazole (PROTONIX) 40 MG tablet Take 40 mg by mouth daily.      . rosuvastatin (CRESTOR) 10 MG tablet Take 10 mg by mouth every morning.    . gabapentin (NEURONTIN) 100 MG capsule Take 1 capsule (100 mg total) by mouth at bedtime. (Patient not taking: Reported on 04/05/2017) 30  capsule 6    No results found for this or any previous visit (from the past 48 hour(s)). No results found.  ROS: Pain with rom of the right lower extremity  Physical Exam:  Alert and oriented 84 y.o. female in no acute distress Cranial nerves 2-12 intact Cervical spine: full rom with no tenderness, nv intact distally Chest: active breath sounds bilaterally, no wheeze rhonchi or rales Heart: regular rate and rhythm, no murmur Abd: non tender non distended with active bowel sounds Hip is stable with rom  Right knee with moderate crepitus with rom Medial and lateral joint line tenderness nv intact distally No rashes or edema  Antalgic gait  Assessment/Plan Assessment: right knee end stage osteoarthritis  Plan: Patient will undergo a right total knee by Dr. Veverly Fells at [facility]. Risks benefits and expectations were discussed with the patient. Patient understand risks, benefits and expectations and wishes to proceed.  Merla Riches PA-C, MPAS [facility] is now [employer] [address] [place], [place], [place] [address] Phone: [phone] [email or web address] Facebook  [employer]

## 2017-05-02 DIAGNOSIS — Z6827 Body mass index (BMI) 27.0-27.9, adult: Secondary | ICD-10-CM | POA: Diagnosis not present

## 2017-05-02 DIAGNOSIS — Z1389 Encounter for screening for other disorder: Secondary | ICD-10-CM | POA: Diagnosis not present

## 2017-05-02 DIAGNOSIS — I10 Essential (primary) hypertension: Secondary | ICD-10-CM | POA: Diagnosis not present

## 2017-05-02 DIAGNOSIS — R05 Cough: Secondary | ICD-10-CM | POA: Diagnosis not present

## 2017-05-02 DIAGNOSIS — E038 Other specified hypothyroidism: Secondary | ICD-10-CM | POA: Diagnosis not present

## 2017-05-02 DIAGNOSIS — E7849 Other hyperlipidemia: Secondary | ICD-10-CM | POA: Diagnosis not present

## 2017-05-02 DIAGNOSIS — Q762 Congenital spondylolisthesis: Secondary | ICD-10-CM | POA: Diagnosis not present

## 2017-05-02 DIAGNOSIS — M169 Osteoarthritis of hip, unspecified: Secondary | ICD-10-CM | POA: Diagnosis not present

## 2017-05-02 DIAGNOSIS — R2689 Other abnormalities of gait and mobility: Secondary | ICD-10-CM | POA: Diagnosis not present

## 2017-05-02 DIAGNOSIS — K589 Irritable bowel syndrome without diarrhea: Secondary | ICD-10-CM | POA: Diagnosis not present

## 2017-05-02 DIAGNOSIS — C50919 Malignant neoplasm of unspecified site of unspecified female breast: Secondary | ICD-10-CM | POA: Diagnosis not present

## 2017-05-02 DIAGNOSIS — I2510 Atherosclerotic heart disease of native coronary artery without angina pectoris: Secondary | ICD-10-CM | POA: Diagnosis not present

## 2017-05-07 NOTE — Pre-Procedure Instructions (Signed)
Kathy Murphy  05/07/2017      [facility] - [place], [place] - [address] [place] [address] [place] [place] [address] Phone: [phone] Fax: [phone]    Your procedure is scheduled on 05/18/2017.  Report to [facility] Admitting at 0730 A.M.  Call this number if you have problems the morning of surgery:  [phone]   Remember:  Do not eat food or drink liquids after midnight.   Continue all medications as directed by your physician except follow these medication instructions before surgery below   Take these medicines the morning of surgery with A SIP OF WATER:  Acetaminophen (Tylenol) - if needed Levothyroxine (Synthroid) Metoprolol succinate (Toprol XL) Pantoprazole (Protonix)  7 days prior to surgery STOP taking any diclofenac sodium CR, Aleve, Naproxen, Ibuprofen, Motrin, Advil, Goody's, BC's, all herbal medications, fish oil, and all vitamins  Follow your doctors instructions regarding your Aspirin.  If no instructions were given by your doctor, then you will need to call the prescribing office office to get instructions.      Do not wear jewelry, make-up or nail polish.  Do not wear lotions, powders, or perfumes, or deodorant.  Do not shave 48 hours prior to surgery.  Do not bring valuables to the hospital.  [facility] is not responsible for any belongings or valuables.  Hearing aids, eyeglasses, contacts, dentures or bridgework may not be worn into surgery.  Leave your suitcase in the car.  After surgery it may be brought to your room.  For patients admitted to the hospital, discharge time will be determined by your treatment team.  Patients discharged the day of surgery will not be allowed to drive home.   Name and phone number of your driver:    Special instructions:   [facility]- Preparing For Surgery  Before surgery, you can play an important role. Because skin is not sterile,  your skin needs to be as free of germs as possible. You can reduce the number of germs on your skin by washing with CHG (chlorahexidine gluconate) Soap before surgery.  CHG is an antiseptic cleaner which kills germs and bonds with the skin to continue killing germs even after washing.  Please do not use if you have an allergy to CHG or antibacterial soaps. If your skin becomes reddened/irritated stop using the CHG.  Do not shave (including legs and underarms) for at least 48 hours prior to first CHG shower. It is OK to shave your face.  Please follow these instructions carefully.   1. Shower the NIGHT BEFORE SURGERY and the MORNING OF SURGERY with CHG.   2. If you chose to wash your hair, wash your hair first as usual with your normal shampoo.  3. After you shampoo, rinse your hair and body thoroughly to remove the shampoo.  4. Use CHG as you would any other liquid soap. You can apply CHG directly to the skin and wash gently with a scrungie or a clean washcloth.   5. Apply the CHG Soap to your body ONLY FROM THE NECK DOWN.  Do not use on open wounds or open sores. Avoid contact with your eyes, ears, mouth and genitals (private parts). Wash Face and genitals (private parts)  with your normal soap.  6. Wash thoroughly, paying special attention to the area where your surgery will be performed.  7. Thoroughly rinse your body with warm water  from the neck down.  8. DO NOT shower/wash with your normal soap after using and rinsing off the CHG Soap.  9. Pat yourself dry with a CLEAN TOWEL.  10. Wear CLEAN PAJAMAS to bed the night before surgery, wear comfortable clothes the morning of surgery  11. Place CLEAN SHEETS on your bed the night of your first shower and DO NOT SLEEP WITH PETS.    Day of Surgery: Shower as stated above. Do not apply any deodorants/lotions.  Please wear clean clothes to the hospital/surgery center.      Please read over the following fact sheets that you were  given.

## 2017-05-08 ENCOUNTER — Encounter (HOSPITAL_COMMUNITY): Payer: Self-pay

## 2017-05-08 ENCOUNTER — Encounter (HOSPITAL_COMMUNITY)
Admission: RE | Admit: 2017-05-08 | Discharge: 2017-05-08 | Disposition: A | Discharge status: Home / Self Care | Payer: Medicare Other | Source: Ambulatory Visit | Attending: Orthopedic Surgery | Admitting: Orthopedic Surgery

## 2017-05-08 ENCOUNTER — Other Ambulatory Visit: Payer: Self-pay

## 2017-05-08 DIAGNOSIS — E039 Hypothyroidism, unspecified: Secondary | ICD-10-CM | POA: Insufficient documentation

## 2017-05-08 DIAGNOSIS — Z79899 Other long term (current) drug therapy: Secondary | ICD-10-CM | POA: Insufficient documentation

## 2017-05-08 DIAGNOSIS — K219 Gastro-esophageal reflux disease without esophagitis: Secondary | ICD-10-CM | POA: Insufficient documentation

## 2017-05-08 DIAGNOSIS — Z9011 Acquired absence of right breast and nipple: Secondary | ICD-10-CM | POA: Insufficient documentation

## 2017-05-08 DIAGNOSIS — E7800 Pure hypercholesterolemia, unspecified: Secondary | ICD-10-CM | POA: Insufficient documentation

## 2017-05-08 DIAGNOSIS — Z01812 Encounter for preprocedural laboratory examination: Secondary | ICD-10-CM | POA: Insufficient documentation

## 2017-05-08 DIAGNOSIS — Z90710 Acquired absence of both cervix and uterus: Secondary | ICD-10-CM | POA: Insufficient documentation

## 2017-05-08 HISTORY — DX: Unqualified visual loss, left eye, normal vision right eye: H54.62

## 2017-05-08 LAB — BASIC METABOLIC PANEL
Anion gap: 9 (ref 5–15)
BUN: 25 mg/dL — AB (ref 6–20)
CALCIUM: 9.1 mg/dL (ref 8.9–10.3)
CO2: 25 mmol/L (ref 22–32)
CREATININE: 1.01 mg/dL — AB (ref 0.44–1.00)
Chloride: 104 mmol/L (ref 101–111)
GFR calc non Af Amer: 49 mL/min — ABNORMAL LOW (ref >60)
GFR, EST AFRICAN AMERICAN: 56 mL/min — AB (ref >60)
GLUCOSE: 178 mg/dL — AB (ref 65–99)
Potassium: 3.6 mmol/L (ref 3.5–5.1)
Sodium: 138 mmol/L (ref 135–145)

## 2017-05-08 LAB — CBC
HCT: 37.1 % (ref 36.0–46.0)
Hemoglobin: 11.2 g/dL — ABNORMAL LOW (ref 12.0–15.0)
MCH: 26.7 pg (ref 26.0–34.0)
MCHC: 30.2 g/dL (ref 30.0–36.0)
MCV: 88.3 fL (ref 78.0–100.0)
PLATELETS: 189 10*3/uL (ref 150–400)
RBC: 4.2 MIL/uL (ref 3.87–5.11)
RDW: 15.5 % (ref 11.5–15.5)
WBC: 7.6 10*3/uL (ref 4.0–10.5)

## 2017-05-08 LAB — SURGICAL PCR SCREEN
MRSA, PCR: NEGATIVE
Staphylococcus aureus: NEGATIVE

## 2017-05-08 NOTE — Progress Notes (Signed)
PCP - Dr. Reynaldo Minium Cardiologist - Dr. Sallyanne Kuster Oncologist - Dr. Jana Hakim  Chest x-ray - n/a EKG - 04/05/2017 Stress Test - 12/30/2015 ECHO - 04/13/2017 Cardiac Cath - 02/08/2016, intervention on 02/11/2016  Sleep Study - patient denies  Aspirin Instructions: Per Dr. Sallyanne Kuster, patient instructed to continue ASA  Anesthesia review: yes, heart history.  Patient denies shortness of breath, fever, cough and chest pain at PAT appointment   Patient verbalized understanding of instructions that were given to them at the PAT appointment. Patient was also instructed that they will need to review over the PAT instructions again at home before surgery.

## 2017-05-09 NOTE — Progress Notes (Signed)
Anesthesia Chart Review:  Pt is an 84 year old female scheduled for R total knee arthroplasty on 05/18/2017 with Netta Cedars, MD  History includes CAD/MI s/p RCA DES X 2 02/2016 (residual diagonal, ramus, distal LAD disease treated with medical therapy), hypercholesterolemia, hypothyroidism, GERD, retinal detachment, GI bleed, thyroid cancer s/p thyroidectomy '57, unsteady gait, myalgias, never smoker, cervical laminectomy, THA '12, hysterectomy '77, R mastectomy 5/36/14 (complicated by fluid collection, evacuated 01/09/17)  Providers:  - PCP is Dr. Burnard Bunting. - HEM-ONC is Dr. Lurline Del. - Cardiologist is Dr. Sanda Klein, last visit 04/05/17. He cleared her for surgery noting "I believe she is at low risk for major complications with orthopedic surgery. It's important that her metoprolol is not discontinued during the perioperative period.  Meds include Arimidex, ASA 81mg , Lipitor, hctz, levothyroxine, losartan, Toprol XL, Protonix, rosuvastatin. Pt to stay on ASA perioperatively.    BP (!) 132/51   Pulse 71   Temp 36.4 C   Resp 20   Ht 5\' 8"  (1.727 m)   Wt 183 lb (83 kg)   SpO2 98%   BMI 27.83 kg/m    Preoperative labs reviewed.    EKG 04/05/17: SR with first degree AV block, possible LA enlargement, LVH.   Echo 04/13/17:  - Left ventricle: The cavity size was normal. There was mild focal basal hypertrophy of the septum. Systolic function was mildly to moderately reduced. The estimated ejection fraction was in the range of 40% to 45%. There is akinesis of the anterior myocardium. Doppler parameters are consistent with abnormal left ventricular relaxation (grade 1 diastolic dysfunction). - Aortic valve: Trileaflet; mildly thickened, mildly calcified leaflets. Valve mobility was restricted. There was trivial regurgitation. - Mitral valve: Mildly thickened leaflets. There was trivial regurgitation. - Left atrium: The atrium was mildly dilated. - Tricuspid valve: There  was trivial regurgitation. - Impressions: Compared to the prior study, there has been no significant   interval change.  Cardiac cath 02/08/16:  Prox LAD to Mid LAD lesion, 50 %stenosed.  Significant coronary artery disease with subtotally occluded mid right coronary collateralized from the left coronary artery (LAD and circumflex).  Severe ostial obstruction in the second diagonal, 75% stenosis in the first diagonal, 70% stenosis within a region of tortuosity in the ramus intermedius, and 50% narrowing in the proximal to mid LAD. The distal LAD is diffusely diseased without focal obstruction.  Low normal LV function with EF estimated to be 50%.  Failed PTCA of the mid right coronary due to poor guide catheter support. RECOMMENDATION:  Begin dual antiplatelet therapy.  Re-attempt PCI of the right coronary from the femoral approach on 02/11/2016. Will discuss with CTO team as well.  Intensify anti-ischemic therapy.  Coronary stent intervention 02/11/16:  Mid RCA chronic total occlusion collateralized from the left coronary artery.  Successful overlapping stents from the distal RCA no the margin of the PDA back to the proximal RCA using two 38 mm long Synergy drug-eluting stents postdilated to 3.0 mm in diameter with 100% stenosis reduced to 0%. Grade 3 flow was noted.  Angio-Seal was used for successful hemostasis.   If no changes, I anticipate pt can proceed with surgery as scheduled.   Willeen Cass, FNP-BC [facility]/Anesthesiology Phone: [phone] 05/09/2017 3:49 PM

## 2017-05-17 MED ORDER — TRANEXAMIC ACID 1000 MG/10ML IV SOLN
1000.0000 mg | INTRAVENOUS | Status: DC
  Filled 2017-05-17: qty 10

## 2017-05-18 ENCOUNTER — Inpatient Hospital Stay (HOSPITAL_COMMUNITY)
Admission: RE | Admit: 2017-05-18 | Discharge: 2017-05-21 | DRG: 470 | Disposition: A | Discharge status: Skilled Nursing Facility | Payer: Medicare Other | Source: Ambulatory Visit | Attending: Orthopedic Surgery | Admitting: Orthopedic Surgery

## 2017-05-18 ENCOUNTER — Inpatient Hospital Stay (HOSPITAL_COMMUNITY): Payer: Medicare Other | Admitting: Anesthesiology

## 2017-05-18 ENCOUNTER — Encounter (HOSPITAL_COMMUNITY): Payer: Self-pay | Admitting: Anesthesiology

## 2017-05-18 ENCOUNTER — Inpatient Hospital Stay (HOSPITAL_COMMUNITY): Payer: Medicare Other | Admitting: Emergency Medicine

## 2017-05-18 ENCOUNTER — Encounter (HOSPITAL_COMMUNITY)
Admission: RE | Disposition: A | Discharge status: Skilled Nursing Facility | Payer: Self-pay | Source: Ambulatory Visit | Attending: Orthopedic Surgery

## 2017-05-18 DIAGNOSIS — E7800 Pure hypercholesterolemia, unspecified: Secondary | ICD-10-CM | POA: Diagnosis present

## 2017-05-18 DIAGNOSIS — Z79890 Hormone replacement therapy (postmenopausal): Secondary | ICD-10-CM

## 2017-05-18 DIAGNOSIS — D62 Acute posthemorrhagic anemia: Secondary | ICD-10-CM | POA: Diagnosis not present

## 2017-05-18 DIAGNOSIS — R32 Unspecified urinary incontinence: Secondary | ICD-10-CM | POA: Diagnosis not present

## 2017-05-18 DIAGNOSIS — E669 Obesity, unspecified: Secondary | ICD-10-CM | POA: Diagnosis not present

## 2017-05-18 DIAGNOSIS — Z853 Personal history of malignant neoplasm of breast: Secondary | ICD-10-CM

## 2017-05-18 DIAGNOSIS — Z9011 Acquired absence of right breast and nipple: Secondary | ICD-10-CM

## 2017-05-18 DIAGNOSIS — Z90710 Acquired absence of both cervix and uterus: Secondary | ICD-10-CM

## 2017-05-18 DIAGNOSIS — K219 Gastro-esophageal reflux disease without esophagitis: Secondary | ICD-10-CM | POA: Diagnosis present

## 2017-05-18 DIAGNOSIS — Z7982 Long term (current) use of aspirin: Secondary | ICD-10-CM | POA: Diagnosis not present

## 2017-05-18 DIAGNOSIS — I2510 Atherosclerotic heart disease of native coronary artery without angina pectoris: Secondary | ICD-10-CM | POA: Diagnosis present

## 2017-05-18 DIAGNOSIS — Z79811 Long term (current) use of aromatase inhibitors: Secondary | ICD-10-CM | POA: Diagnosis not present

## 2017-05-18 DIAGNOSIS — M1711 Unilateral primary osteoarthritis, right knee: Secondary | ICD-10-CM | POA: Diagnosis present

## 2017-05-18 DIAGNOSIS — Z96642 Presence of left artificial hip joint: Secondary | ICD-10-CM | POA: Diagnosis present

## 2017-05-18 DIAGNOSIS — E890 Postprocedural hypothyroidism: Secondary | ICD-10-CM | POA: Diagnosis present

## 2017-05-18 DIAGNOSIS — E039 Hypothyroidism, unspecified: Secondary | ICD-10-CM | POA: Diagnosis not present

## 2017-05-18 DIAGNOSIS — Z471 Aftercare following joint replacement surgery: Secondary | ICD-10-CM | POA: Diagnosis not present

## 2017-05-18 DIAGNOSIS — Z87440 Personal history of urinary (tract) infections: Secondary | ICD-10-CM | POA: Diagnosis not present

## 2017-05-18 DIAGNOSIS — M25561 Pain in right knee: Secondary | ICD-10-CM | POA: Diagnosis not present

## 2017-05-18 DIAGNOSIS — I10 Essential (primary) hypertension: Secondary | ICD-10-CM | POA: Diagnosis present

## 2017-05-18 DIAGNOSIS — G8918 Other acute postprocedural pain: Secondary | ICD-10-CM | POA: Diagnosis not present

## 2017-05-18 DIAGNOSIS — M1990 Unspecified osteoarthritis, unspecified site: Secondary | ICD-10-CM | POA: Diagnosis not present

## 2017-05-18 DIAGNOSIS — I252 Old myocardial infarction: Secondary | ICD-10-CM

## 2017-05-18 DIAGNOSIS — C50411 Malignant neoplasm of upper-outer quadrant of right female breast: Secondary | ICD-10-CM | POA: Diagnosis not present

## 2017-05-18 DIAGNOSIS — Z96651 Presence of right artificial knee joint: Secondary | ICD-10-CM | POA: Diagnosis not present

## 2017-05-18 DIAGNOSIS — C73 Malignant neoplasm of thyroid gland: Secondary | ICD-10-CM | POA: Diagnosis not present

## 2017-05-18 DIAGNOSIS — I119 Hypertensive heart disease without heart failure: Secondary | ICD-10-CM | POA: Diagnosis not present

## 2017-05-18 DIAGNOSIS — Z96659 Presence of unspecified artificial knee joint: Secondary | ICD-10-CM

## 2017-05-18 DIAGNOSIS — C519 Malignant neoplasm of vulva, unspecified: Secondary | ICD-10-CM | POA: Diagnosis not present

## 2017-05-18 HISTORY — PX: TOTAL KNEE ARTHROPLASTY: SHX125

## 2017-05-18 SURGERY — ARTHROPLASTY, KNEE, TOTAL
Anesthesia: Monitor Anesthesia Care | Site: Knee | Laterality: Right

## 2017-05-18 MED ORDER — LOSARTAN POTASSIUM 50 MG PO TABS
100.0000 mg | ORAL_TABLET | Freq: Every day | ORAL | Status: DC
  Administered 2017-05-19 – 2017-05-21 (×3): 100 mg via ORAL
  Filled 2017-05-18 (×2): qty 2

## 2017-05-18 MED ORDER — LACTATED RINGERS IV SOLN
INTRAVENOUS | Status: DC
  Administered 2017-05-18: 50 mL/h via INTRAVENOUS

## 2017-05-18 MED ORDER — CHLORHEXIDINE GLUCONATE 4 % EX LIQD: 60.0000 mL | Freq: Once | CUTANEOUS | Status: DC

## 2017-05-18 MED ORDER — ASPIRIN 81 MG PO CHEW
81.0000 mg | CHEWABLE_TABLET | Freq: Two times a day (BID) | ORAL | Status: DC
  Administered 2017-05-19 – 2017-05-21 (×5): 81 mg via ORAL
  Filled 2017-05-18 (×6): qty 1

## 2017-05-18 MED ORDER — PHENYLEPHRINE HCL 10 MG/ML IJ SOLN
INTRAVENOUS | Status: DC | PRN
  Administered 2017-05-18: 25 ug/min via INTRAVENOUS

## 2017-05-18 MED ORDER — 0.9 % SODIUM CHLORIDE (POUR BTL) OPTIME
TOPICAL | Status: DC | PRN
  Administered 2017-05-18: 1000 mL

## 2017-05-18 MED ORDER — ONDANSETRON HCL 4 MG/2ML IJ SOLN: 4.0000 mg | Freq: Four times a day (QID) | INTRAMUSCULAR | Status: DC | PRN

## 2017-05-18 MED ORDER — BUPIVACAINE IN DEXTROSE 0.75-8.25 % IT SOLN
INTRATHECAL | Status: DC | PRN
  Administered 2017-05-18: 1.8 mL via INTRATHECAL

## 2017-05-18 MED ORDER — HYPROMELLOSE (GONIOSCOPIC) 2.5 % OP SOLN
1.0000 [drp] | Freq: Every day | OPHTHALMIC | Status: DC
  Administered 2017-05-19: 1 [drp] via OPHTHALMIC
  Filled 2017-05-18: qty 15

## 2017-05-18 MED ORDER — PHENOL 1.4 % MT LIQD: 1.0000 | OROMUCOSAL | Status: DC | PRN

## 2017-05-18 MED ORDER — FERROUS SULFATE 325 (65 FE) MG PO TABS
325.0000 mg | ORAL_TABLET | Freq: Three times a day (TID) | ORAL | Status: DC
  Administered 2017-05-19 – 2017-05-21 (×7): 325 mg via ORAL
  Filled 2017-05-18 (×7): qty 1

## 2017-05-18 MED ORDER — PANTOPRAZOLE SODIUM 40 MG PO TBEC
40.0000 mg | DELAYED_RELEASE_TABLET | Freq: Every day | ORAL | Status: DC
  Administered 2017-05-19 – 2017-05-21 (×3): 40 mg via ORAL
  Filled 2017-05-18 (×3): qty 1

## 2017-05-18 MED ORDER — MIDAZOLAM HCL 2 MG/2ML IJ SOLN
INTRAMUSCULAR | Status: AC
  Filled 2017-05-18: qty 2

## 2017-05-18 MED ORDER — ANASTROZOLE 1 MG PO TABS
1.0000 mg | ORAL_TABLET | Freq: Every day | ORAL | Status: DC
  Administered 2017-05-19 – 2017-05-21 (×3): 1 mg via ORAL
  Filled 2017-05-18 (×4): qty 1

## 2017-05-18 MED ORDER — HYDROCODONE-ACETAMINOPHEN 7.5-325 MG PO TABS
1.0000 | ORAL_TABLET | ORAL | Status: DC | PRN
Start: 2017-05-18 — End: 2017-05-21
  Administered 2017-05-19 – 2017-05-21 (×4): 1 via ORAL
  Filled 2017-05-18 (×4): qty 1

## 2017-05-18 MED ORDER — ONDANSETRON HCL 4 MG/2ML IJ SOLN
INTRAMUSCULAR | Status: DC | PRN
  Administered 2017-05-18: 4 mg via INTRAVENOUS

## 2017-05-18 MED ORDER — TRAMADOL HCL 50 MG PO TABS
50.0000 mg | ORAL_TABLET | Freq: Four times a day (QID) | ORAL | Status: DC
Start: 2017-05-18 — End: 2017-05-21
  Administered 2017-05-19 – 2017-05-21 (×6): 50 mg via ORAL
  Filled 2017-05-18 (×7): qty 1

## 2017-05-18 MED ORDER — METOPROLOL SUCCINATE ER 25 MG PO TB24
12.5000 mg | ORAL_TABLET | Freq: Every day | ORAL | Status: DC
  Administered 2017-05-19 – 2017-05-21 (×3): 12.5 mg via ORAL
  Filled 2017-05-18 (×3): qty 1

## 2017-05-18 MED ORDER — SODIUM CHLORIDE 0.9 % IV SOLN: INTRAVENOUS | Status: DC

## 2017-05-18 MED ORDER — ACETAMINOPHEN 325 MG PO TABS
325.0000 mg | ORAL_TABLET | Freq: Four times a day (QID) | ORAL | Status: DC | PRN
  Administered 2017-05-19: 325 mg via ORAL
  Filled 2017-05-18: qty 1

## 2017-05-18 MED ORDER — METOCLOPRAMIDE HCL 5 MG PO TABS: 5.0000 mg | ORAL_TABLET | Freq: Three times a day (TID) | ORAL | Status: DC | PRN

## 2017-05-18 MED ORDER — PROPOFOL 10 MG/ML IV BOLUS
INTRAVENOUS | Status: AC
Start: 2017-05-18 — End: ?
  Filled 2017-05-18: qty 20

## 2017-05-18 MED ORDER — LEVOTHYROXINE SODIUM 100 MCG PO TABS
125.0000 ug | ORAL_TABLET | Freq: Every day | ORAL | Status: DC
  Administered 2017-05-19 – 2017-05-20 (×2): 125 ug via ORAL
  Filled 2017-05-18 (×2): qty 1

## 2017-05-18 MED ORDER — DOCUSATE SODIUM 100 MG PO CAPS
100.0000 mg | ORAL_CAPSULE | Freq: Two times a day (BID) | ORAL | Status: DC
  Administered 2017-05-18 – 2017-05-21 (×7): 100 mg via ORAL
  Filled 2017-05-18 (×7): qty 1

## 2017-05-18 MED ORDER — ACETAMINOPHEN ER 650 MG PO TBCR
1300.0000 mg | EXTENDED_RELEASE_TABLET | Freq: Three times a day (TID) | ORAL | Status: DC | PRN
  Filled 2017-05-18: qty 2

## 2017-05-18 MED ORDER — SODIUM CHLORIDE 0.9 % IV SOLN
1000.0000 mg | Freq: Once | INTRAVENOUS | Status: DC
  Filled 2017-05-18: qty 10

## 2017-05-18 MED ORDER — FENTANYL CITRATE (PF) 100 MCG/2ML IJ SOLN: 25.0000 ug | INTRAMUSCULAR | Status: DC | PRN

## 2017-05-18 MED ORDER — ASPIRIN EC 81 MG PO TBEC
81.0000 mg | DELAYED_RELEASE_TABLET | Freq: Every day | ORAL | Status: DC
  Administered 2017-05-20 – 2017-05-21 (×2): 81 mg via ORAL
  Filled 2017-05-18 (×4): qty 1

## 2017-05-18 MED ORDER — MENTHOL 3 MG MT LOZG: 1.0000 | LOZENGE | OROMUCOSAL | Status: DC | PRN

## 2017-05-18 MED ORDER — SODIUM CHLORIDE 0.9 % IR SOLN
Status: DC | PRN
  Administered 2017-05-18: 3000 mL

## 2017-05-18 MED ORDER — METOCLOPRAMIDE HCL 5 MG/ML IJ SOLN: 5.0000 mg | Freq: Three times a day (TID) | INTRAMUSCULAR | Status: DC | PRN

## 2017-05-18 MED ORDER — ONDANSETRON HCL 4 MG PO TABS: 4.0000 mg | ORAL_TABLET | Freq: Four times a day (QID) | ORAL | Status: DC | PRN

## 2017-05-18 MED ORDER — ROSUVASTATIN CALCIUM 10 MG PO TABS
10.0000 mg | ORAL_TABLET | ORAL | Status: DC
  Administered 2017-05-21: 10 mg via ORAL
  Filled 2017-05-18 (×3): qty 1

## 2017-05-18 MED ORDER — OXYCODONE HCL 5 MG PO TABS: 5.0000 mg | ORAL_TABLET | Freq: Once | ORAL | Status: DC | PRN

## 2017-05-18 MED ORDER — FENTANYL CITRATE (PF) 100 MCG/2ML IJ SOLN
INTRAMUSCULAR | Status: AC
  Administered 2017-05-18: 50 ug via INTRAVENOUS
  Filled 2017-05-18: qty 2

## 2017-05-18 MED ORDER — MIDAZOLAM HCL 2 MG/2ML IJ SOLN
1.0000 mg | Freq: Once | INTRAMUSCULAR | Status: AC
  Administered 2017-05-18: 1 mg via INTRAVENOUS

## 2017-05-18 MED ORDER — CEFAZOLIN SODIUM-DEXTROSE 2-4 GM/100ML-% IV SOLN: 2.0000 g | INTRAVENOUS | Status: DC

## 2017-05-18 MED ORDER — HYDROCHLOROTHIAZIDE 12.5 MG PO CAPS: 12.5000 mg | ORAL_CAPSULE | ORAL | Status: DC

## 2017-05-18 MED ORDER — ONDANSETRON HCL 4 MG/2ML IJ SOLN
INTRAMUSCULAR | Status: AC
  Filled 2017-05-18: qty 2

## 2017-05-18 MED ORDER — CEFAZOLIN SODIUM-DEXTROSE 2-4 GM/100ML-% IV SOLN
INTRAVENOUS | Status: AC
  Filled 2017-05-18: qty 100

## 2017-05-18 MED ORDER — PROPOFOL 10 MG/ML IV BOLUS
INTRAVENOUS | Status: AC
  Filled 2017-05-18: qty 20

## 2017-05-18 MED ORDER — PROPOFOL 1000 MG/100ML IV EMUL
INTRAVENOUS | Status: AC
  Filled 2017-05-18: qty 100

## 2017-05-18 MED ORDER — HYDROCODONE-ACETAMINOPHEN 5-325 MG PO TABS
1.0000 | ORAL_TABLET | ORAL | Status: DC | PRN
  Administered 2017-05-18 (×2): 2 via ORAL
  Filled 2017-05-18 (×2): qty 2

## 2017-05-18 MED ORDER — CEFAZOLIN SODIUM-DEXTROSE 2-4 GM/100ML-% IV SOLN
2.0000 g | Freq: Four times a day (QID) | INTRAVENOUS | Status: AC
  Administered 2017-05-18: 2 g via INTRAVENOUS
  Filled 2017-05-18 (×2): qty 100

## 2017-05-18 MED ORDER — OXYCODONE HCL 5 MG/5ML PO SOLN: 5.0000 mg | Freq: Once | ORAL | Status: DC | PRN

## 2017-05-18 MED ORDER — DICLOFENAC SODIUM 50 MG PO TBEC
50.0000 mg | DELAYED_RELEASE_TABLET | Freq: Two times a day (BID) | ORAL | Status: DC
  Administered 2017-05-18 – 2017-05-21 (×7): 50 mg via ORAL
  Filled 2017-05-18 (×7): qty 1

## 2017-05-18 MED ORDER — CARBOXYMETHYLCELLULOSE SODIUM 0.5 % OP SOLN: 1.0000 [drp] | Freq: Every day | OPHTHALMIC | Status: DC

## 2017-05-18 MED ORDER — FENTANYL CITRATE (PF) 100 MCG/2ML IJ SOLN
50.0000 ug | Freq: Once | INTRAMUSCULAR | Status: AC
  Administered 2017-05-18: 50 ug via INTRAVENOUS

## 2017-05-18 MED ORDER — FENTANYL CITRATE (PF) 250 MCG/5ML IJ SOLN
INTRAMUSCULAR | Status: AC
  Filled 2017-05-18: qty 5

## 2017-05-18 MED ORDER — MORPHINE SULFATE (PF) 2 MG/ML IV SOLN
0.5000 mg | INTRAVENOUS | Status: DC | PRN
  Administered 2017-05-18 (×2): 0.5 mg via INTRAVENOUS
  Filled 2017-05-18 (×2): qty 1

## 2017-05-18 MED ORDER — ROPIVACAINE HCL 7.5 MG/ML IJ SOLN
INTRAMUSCULAR | Status: DC | PRN
  Administered 2017-05-18: 20 mL via PERINEURAL

## 2017-05-18 MED ORDER — PROPOFOL 500 MG/50ML IV EMUL
INTRAVENOUS | Status: DC | PRN
  Administered 2017-05-18: 75 ug/kg/min via INTRAVENOUS

## 2017-05-18 MED ORDER — OXYCODONE HCL 5 MG PO TABS: 5.0000 mg | ORAL_TABLET | ORAL | 0 refills | Status: DC | PRN

## 2017-05-18 SURGICAL SUPPLY — 61 items
BANDAGE ELASTIC 6 VELCRO ST LF (GAUZE/BANDAGES/DRESSINGS) ×2 IMPLANT
BANDAGE ESMARK 6X9 LF (GAUZE/BANDAGES/DRESSINGS) ×1 IMPLANT
BLADE SAG 18X100X1.27 (BLADE) ×3 IMPLANT
BLADE SAW SGTL 13X75X1.27 (BLADE) ×3 IMPLANT
BNDG CMPR 9X6 STRL LF SNTH (GAUZE/BANDAGES/DRESSINGS) ×1
BNDG CMPR MED 10X6 ELC LF (GAUZE/BANDAGES/DRESSINGS) ×1
BNDG ELASTIC 6X10 VLCR STRL LF (GAUZE/BANDAGES/DRESSINGS) ×3 IMPLANT
BNDG ESMARK 6X9 LF (GAUZE/BANDAGES/DRESSINGS) ×3
BNDG GAUZE ELAST 4 BULKY (GAUZE/BANDAGES/DRESSINGS) ×6 IMPLANT
BOWL SMART MIX CTS (DISPOSABLE) ×3 IMPLANT
CAP KNEE TOTAL 3 SIGMA ×2 IMPLANT
CEMENT HV SMART SET (Cement) ×6 IMPLANT
CLOSURE WOUND 1/2 X4 (GAUZE/BANDAGES/DRESSINGS) ×2
COVER SURGICAL LIGHT HANDLE (MISCELLANEOUS) ×3 IMPLANT
CUFF TOURNIQUET SINGLE 34IN LL (TOURNIQUET CUFF) ×2 IMPLANT
CUFF TOURNIQUET SINGLE 44IN (TOURNIQUET CUFF) IMPLANT
DRAPE EXTREMITY T 121X128X90 (DRAPE) ×3 IMPLANT
DRAPE HALF SHEET 40X57 (DRAPES) ×3 IMPLANT
DRAPE U-SHAPE 47X51 STRL (DRAPES) ×3 IMPLANT
DRSG ADAPTIC 3X8 NADH LF (GAUZE/BANDAGES/DRESSINGS) ×3 IMPLANT
DRSG PAD ABDOMINAL 8X10 ST (GAUZE/BANDAGES/DRESSINGS) ×6 IMPLANT
DURAPREP 26ML APPLICATOR (WOUND CARE) ×7 IMPLANT
ELECT CAUTERY BLADE 6.4 (BLADE) ×3 IMPLANT
ELECT REM PT RETURN 9FT ADLT (ELECTROSURGICAL) ×3
ELECTRODE REM PT RTRN 9FT ADLT (ELECTROSURGICAL) ×1 IMPLANT
FACESHIELD WRAPAROUND (MASK) ×3 IMPLANT
FACESHIELD WRAPAROUND OR TEAM (MASK) IMPLANT
GAUZE SPONGE 4X4 12PLY STRL (GAUZE/BANDAGES/DRESSINGS) ×3 IMPLANT
GLOVE BIOGEL PI ORTHO PRO 7.5 (GLOVE) ×2
GLOVE BIOGEL PI ORTHO PRO SZ8 (GLOVE) ×2
GLOVE PI ORTHO PRO STRL 7.5 (GLOVE) ×1 IMPLANT
GLOVE PI ORTHO PRO STRL SZ8 (GLOVE) ×1 IMPLANT
GLOVE SURG SS PI 7.5 STRL IVOR (GLOVE) ×2 IMPLANT
GOWN STRL REUS W/ TWL XL LVL3 (GOWN DISPOSABLE) ×3 IMPLANT
GOWN STRL REUS W/TWL XL LVL3 (GOWN DISPOSABLE) ×9
HANDPIECE INTERPULSE COAX TIP (DISPOSABLE) ×3
IMMOBILIZER KNEE 22 UNIV (SOFTGOODS) IMPLANT
KIT BASIN OR (CUSTOM PROCEDURE TRAY) ×3 IMPLANT
KIT MANIFOLD (MISCELLANEOUS) ×3 IMPLANT
KIT ROOM TURNOVER OR (KITS) ×3 IMPLANT
MANIFOLD NEPTUNE II (INSTRUMENTS) ×3 IMPLANT
NS IRRIG 1000ML POUR BTL (IV SOLUTION) ×3 IMPLANT
PACK TOTAL JOINT (CUSTOM PROCEDURE TRAY) ×3 IMPLANT
PAD ARMBOARD 7.5X6 YLW CONV (MISCELLANEOUS) ×6 IMPLANT
PIN STEINMAN FIXATION KNEE (PIN) IMPLANT
SET HNDPC FAN SPRY TIP SCT (DISPOSABLE) ×1 IMPLANT
STRIP CLOSURE SKIN 1/2X4 (GAUZE/BANDAGES/DRESSINGS) ×4 IMPLANT
SUCTION FRAZIER HANDLE 10FR (MISCELLANEOUS) ×2
SUCTION TUBE FRAZIER 10FR DISP (MISCELLANEOUS) ×1 IMPLANT
SUT MNCRL AB 3-0 PS2 18 (SUTURE) ×5 IMPLANT
SUT VIC AB 0 CT1 27 (SUTURE) ×6
SUT VIC AB 0 CT1 27XBRD ANBCTR (SUTURE) ×2 IMPLANT
SUT VIC AB 1 CT1 27 (SUTURE) ×6
SUT VIC AB 1 CT1 27XBRD ANBCTR (SUTURE) ×3 IMPLANT
SUT VIC AB 2-0 CT1 27 (SUTURE) ×6
SUT VIC AB 2-0 CT1 TAPERPNT 27 (SUTURE) ×2 IMPLANT
TOWEL OR 17X24 6PK STRL BLUE (TOWEL DISPOSABLE) ×3 IMPLANT
TOWEL OR 17X26 10 PK STRL BLUE (TOWEL DISPOSABLE) ×3 IMPLANT
TRAY CATH 16FR W/PLASTIC CATH (SET/KITS/TRAYS/PACK) IMPLANT
TRAY FOLEY BAG SILVER LF 16FR (CATHETERS) ×2 IMPLANT
TRAY FOLEY W/METER SILVER 16FR (SET/KITS/TRAYS/PACK) ×2 IMPLANT

## 2017-05-18 NOTE — Care Plan (Signed)
R TKA scheduled 05-18-17 DCP:  [facility].  Referral submitted prior to surgery date.   Pt lives alone in a 1 story home with 3 ste.   DME:  No needs P/O PT:  OP PT will be scheduled at [facility] once DC date from [facility] set.

## 2017-05-18 NOTE — Anesthesia Preprocedure Evaluation (Signed)
Anesthesia Evaluation  Patient identified by MRN, date of birth, ID band Patient awake    Reviewed: Allergy & Precautions, NPO status , Patient's Chart, lab work & pertinent test results, reviewed documented beta blocker date and time   History of Anesthesia Complications Negative for: history of anesthetic complications  Airway Mallampati: II  TM Distance: >3 FB Neck ROM: Full    Dental  (+) Teeth Intact   Pulmonary neg pulmonary ROS,    breath sounds clear to auscultation       Cardiovascular hypertension, Pt. on medications and Pt. on home beta blockers (-) angina+ CAD, + Past MI, + Cardiac Stents and +CHF   Rhythm:Regular     Neuro/Psych negative neurological ROS  negative psych ROS   GI/Hepatic Neg liver ROS, GERD  Medicated and Controlled,  Endo/Other  Hypothyroidism   Renal/GU negative Renal ROS     Musculoskeletal  (+) Arthritis ,   Abdominal   Peds  Hematology negative hematology ROS (+)   Anesthesia Other Findings   Reproductive/Obstetrics                             Anesthesia Physical Anesthesia Plan  ASA: II  Anesthesia Plan: MAC, Regional and Spinal   Post-op Pain Management:    Induction:   PONV Risk Score and Plan: 2 and Treatment may vary due to age or medical condition  Airway Management Planned: Nasal Cannula  Additional Equipment:   Intra-op Plan:   Post-operative Plan:   Informed Consent: I have reviewed the patients History and Physical, chart, labs and discussed the procedure including the risks, benefits and alternatives for the proposed anesthesia with the patient or authorized representative who has indicated his/her understanding and acceptance.   Dental advisory given  Plan Discussed with: CRNA and Surgeon  Anesthesia Plan Comments:         Anesthesia Quick Evaluation

## 2017-05-18 NOTE — Progress Notes (Signed)
Orthopedic Tech Progress Note Patient Details:  Kathy Murphy 1927/08/07 [id number]  CPM Right Knee CPM Right Knee: On Right Knee Flexion (Degrees): 90 Additional Comments: Cpm applied to pt right knee. pt tolerated application.  bone foam provided at bedside.  Right knee.      Kristopher Oppenheim 05/18/2017, 1:11 PM

## 2017-05-18 NOTE — Anesthesia Procedure Notes (Signed)
Spinal  Patient location during procedure: OR Start time: 05/18/2017 10:10 AM End time: 05/18/2017 10:12 AM Staffing Anesthesiologist: Oleta Mouse, MD Preanesthetic Checklist Completed: patient identified, surgical consent, pre-op evaluation, timeout performed, IV checked, risks and benefits discussed and monitors and equipment checked Spinal Block Patient position: sitting Prep: site prepped and draped and DuraPrep Patient monitoring: heart rate, cardiac monitor, continuous pulse ox and blood pressure Approach: midline Location: L4-5 Injection technique: single-shot Needle Needle type: Pencan  Needle gauge: 24 G Needle length: 10 cm Assessment Sensory level: T4

## 2017-05-18 NOTE — Transfer of Care (Signed)
Immediate Anesthesia Transfer of Care Note  Patient: Kathy Murphy  Procedure(s) Performed: RIGHT TOTAL KNEE ARTHROPLASTY (Right Knee)  Patient Location: PACU  Anesthesia Type:Spinal  Level of Consciousness: awake, alert  and oriented  Airway & Oxygen Therapy: Patient Spontanous Breathing and Patient connected to nasal cannula oxygen  Post-op Assessment: Report given to RN and Post -op Vital signs reviewed and stable  Post vital signs: Reviewed and stable  Last Vitals:  Vitals:   05/18/17 0945 05/18/17 1221  BP: (!) 160/40   Pulse: (!) 58   Resp: 15   Temp:  36.6 C  SpO2: 100%     Last Pain:  Vitals:   05/18/17 1221  TempSrc:   PainSc: 0-No pain      Patients Stated Pain Goal: 6 (60/73/71 0626)  Complications: No apparent anesthesia complications

## 2017-05-18 NOTE — Anesthesia Procedure Notes (Signed)
Procedure Name: MAC Date/Time: 05/18/2017 10:15 AM Performed by: Kyung Rudd, CRNA Pre-anesthesia Checklist: Patient identified, Emergency Drugs available, Suction available and Patient being monitored Patient Re-evaluated:Patient Re-evaluated prior to induction Oxygen Delivery Method: Simple face mask Induction Type: IV induction Placement Confirmation: positive ETCO2

## 2017-05-18 NOTE — Evaluation (Signed)
Physical Therapy Evaluation Patient Details Name: Kathy Murphy MRN: [id number] DOB: 11/11/1927 Today's Date: 05/18/2017   History of Present Illness  Pt is an 84 y/o female s/p elective R TKA. PMH includes R breast cancer s/p mastectomy, thyroid cancer, HTN, MI, and L THA.   Clinical Impression  Pt is s/p surgery above with deficits below. Pt very unsteady during mobility and required mod to max A secondary to post op pain and weakness, therefore mobility limited to standing at EOB. Reviewed supine HEP and knee precautions with pt. Feel pt is currently a high fall risk. Pt reports she will be going to [facility] at d/c prior to return home. Will continue to follow acutely to maximize functional mobility independence and safety.     Follow Up Recommendations Follow surgeon's recommendation for DC plan and follow-up therapies;Supervision for mobility/OOB    Equipment Recommendations  None recommended by PT    Recommendations for Other Services OT consult     Precautions / Restrictions Precautions Precautions: Knee Precaution Booklet Issued: Yes (comment) Precaution Comments: Reviewed supine ther ex and knee precautions with pt.  Required Braces or Orthoses: Knee Immobilizer - Right Knee Immobilizer - Right: Other (comment)(until discontinued ) Restrictions Weight Bearing Restrictions: Yes RLE Weight Bearing: Weight bearing as tolerated      Mobility  Bed Mobility Overal bed mobility: Needs Assistance Bed Mobility: Sit to Supine       Sit to supine: Mod assist   General bed mobility comments: Pt sitting EOB upon entry. Mod A for LE lift assist for return to supine.   Transfers Overall transfer level: Needs assistance Equipment used: Rolling walker (2 wheeled) Transfers: Sit to/from Stand Sit to Stand: Mod assist;Max assist;From elevated surface         General transfer comment: Sit<>Stand X 2 this session. On first attempt, requiring max A for lift assist and  steadying. REquired verbal cues for safe hand placement and sequencing during transfer. Stood a second time and required mod A for lift assist and steadying. Unsteady during standing and unable to accept weight on RLE, therefore further mobility deferred.   Ambulation/Gait             General Gait Details: Deferred   Stairs            Wheelchair Mobility    Modified Rankin (Stroke Patients Only)       Balance Overall balance assessment: Needs assistance Sitting-balance support: No upper extremity supported;Feet supported Sitting balance-Leahy Scale: Good     Standing balance support: Bilateral upper extremity supported Standing balance-Leahy Scale: Poor Standing balance comment: Reliant on BUE and external assist to maintain standing balance.                              Pertinent Vitals/Pain Pain Assessment: Faces Faces Pain Scale: Hurts even more Pain Location: R knee  Pain Descriptors / Indicators: Aching;Operative site guarding Pain Intervention(s): Limited activity within patient's tolerance;Monitored during session;Repositioned    Home Living Family/patient expects to be discharged to:: Skilled nursing facility                 Additional Comments: Reports she will be going to [facility] at d/c.     Prior Function Level of Independence: Independent with assistive device(s)         Comments: Reports using RW for ambulation      Hand Dominance        Extremity/Trunk  Assessment   Upper Extremity Assessment Upper Extremity Assessment: Defer to OT evaluation    Lower Extremity Assessment Lower Extremity Assessment: RLE deficits/detail RLE Deficits / Details: Reports slight decreased sensation. Able to perform ther ex below. Deficits consistent with post op pain and weakness.  RLE Sensation: decreased light touch    Cervical / Trunk Assessment Cervical / Trunk Assessment: Kyphotic  Communication   Communication: No  difficulties  Cognition Arousal/Alertness: Awake/alert Behavior During Therapy: WFL for tasks assessed/performed Overall Cognitive Status: Within Functional Limits for tasks assessed                                        General Comments      Exercises Total Joint Exercises Ankle Circles/Pumps: AROM;Both;10 reps Quad Sets: AROM;Right;10 reps;Supine   Assessment/Plan    PT Assessment Patient needs continued PT services  PT Problem List Decreased strength;Decreased balance;Decreased range of motion;Decreased mobility;Decreased coordination;Decreased knowledge of use of DME;Decreased knowledge of precautions;Pain;Impaired sensation       PT Treatment Interventions DME instruction;Gait training;Functional mobility training;Therapeutic activities;Therapeutic exercise;Balance training;Neuromuscular re-education;Patient/family education    PT Goals (Current goals can be found in the Care Plan section)  Acute Rehab PT Goals Patient Stated Goal: to go to SNF before going home  PT Goal Formulation: With patient Time For Goal Achievement: 06/01/17 Potential to Achieve Goals: Good    Frequency 7X/week   Barriers to discharge Decreased caregiver support Lives alone     Co-evaluation               AM-PAC PT "6 Clicks" Daily Activity  Outcome Measure Difficulty turning over in bed (including adjusting bedclothes, sheets and blankets)?: A Little Difficulty moving from lying on back to sitting on the side of the bed? : Unable Difficulty sitting down on and standing up from a chair with arms (e.g., wheelchair, bedside commode, etc,.)?: Unable Help needed moving to and from a bed to chair (including a wheelchair)?: A Lot Help needed walking in hospital room?: Total Help needed climbing 3-5 steps with a railing? : Total 6 Click Score: 9    End of Session Equipment Utilized During Treatment: Gait belt;Right knee immobilizer Activity Tolerance: Patient tolerated  treatment well Patient left: in bed;with call bell/phone within reach;with family/visitor present Nurse Communication: Mobility status PT Visit Diagnosis: Other abnormalities of gait and mobility (R26.89)    Time: 7322-0254 PT Time Calculation (min) (ACUTE ONLY): 27 min   Charges:   PT Evaluation $PT Eval Moderate Complexity: 1 Mod PT Treatments $Therapeutic Activity: 8-22 mins   PT G Codes:        Leighton Ruff, PT, DPT  Acute Rehabilitation Services  Pager: [phone]   Rudean Hitt 05/18/2017, 4:36 PM

## 2017-05-18 NOTE — Anesthesia Procedure Notes (Signed)
Anesthesia Regional Block: Adductor canal block   Pre-Anesthetic Checklist: ,, timeout performed, Correct Patient, Correct Site, Correct Laterality, Correct Procedure, Correct Position, site marked, Risks and benefits discussed,  Surgical consent,  Pre-op evaluation,  At surgeon's request and post-op pain management  Laterality: Lower and Right  Prep: chloraprep       Needles:  Injection technique: Single-shot  Needle Type: Echogenic Stimulator Needle          Additional Needles:   Procedures:,,,, ultrasound used (permanent image in chart),,,,  Narrative:  Start time: 05/18/2017 9:39 AM End time: 05/18/2017 9:41 AM Injection made incrementally with aspirations every 5 mL.  Performed by: Personally  Anesthesiologist: Oleta Mouse, MD  Additional Notes: H+P and labs reviewed, risks and benefits discussed with patient, procedure tolerated well without complications

## 2017-05-18 NOTE — Interval H&P Note (Signed)
History and Physical Interval Note:  05/18/2017 9:53 AM  Kathy Murphy  has presented today for surgery, with the diagnosis of Right knee osteoarthritis  The various methods of treatment have been discussed with the patient and family. After consideration of risks, benefits and other options for treatment, the patient has consented to  Procedure(s): RIGHT TOTAL KNEE ARTHROPLASTY (Right) as a surgical intervention .  The patient's history has been reviewed, patient examined, no change in status, stable for surgery.  I have reviewed the patient's chart and labs.  Questions were answered to the patient's satisfaction.     Dejion Grillo,STEVEN R

## 2017-05-18 NOTE — Brief Op Note (Signed)
05/18/2017  12:19 PM  PATIENT:  Kathy Murphy  84 y.o. female  PRE-OPERATIVE DIAGNOSIS:  Right knee osteoarthritis, end stage  POST-OPERATIVE DIAGNOSIS:  Right knee osteoarthritis, end stage  PROCEDURE:  Procedure(s): RIGHT TOTAL KNEE ARTHROPLASTY (Right) DePuy Sigma RP  SURGEON:  Surgeon(s) and Role:    Netta Cedars, MD - Primary  PHYSICIAN ASSISTANT:   ASSISTANTS: Ventura Bruns, PA-C   ANESTHESIA:   regional and spinal  EBL:  Minimal    BLOOD ADMINISTERED:none  DRAINS: none   LOCAL MEDICATIONS USED:  NONE  SPECIMEN:  No Specimen  DISPOSITION OF SPECIMEN:  N/A  COUNTS:  YES  TOURNIQUET:   Total Tourniquet Time Documented: Thigh (Right) - 91 minutes Total: Thigh (Right) - 91 minutes   DICTATION: .Other Dictation: Dictation Number [id number]  PLAN OF CARE: Admit to inpatient   PATIENT DISPOSITION:  PACU - hemodynamically stable.   Delay start of Pharmacological VTE agent (>24hrs) due to surgical blood loss or risk of bleeding: no

## 2017-05-19 LAB — BASIC METABOLIC PANEL
Anion gap: 9 (ref 5–15)
BUN: 17 mg/dL (ref 6–20)
CO2: 26 mmol/L (ref 22–32)
CREATININE: 0.84 mg/dL (ref 0.44–1.00)
Calcium: 8.6 mg/dL — ABNORMAL LOW (ref 8.9–10.3)
Chloride: 103 mmol/L (ref 101–111)
Glucose, Bld: 130 mg/dL — ABNORMAL HIGH (ref 65–99)
POTASSIUM: 3.5 mmol/L (ref 3.5–5.1)
SODIUM: 138 mmol/L (ref 135–145)

## 2017-05-19 LAB — CBC
HCT: 32.5 % — ABNORMAL LOW (ref 36.0–46.0)
Hemoglobin: 9.7 g/dL — ABNORMAL LOW (ref 12.0–15.0)
MCH: 25.9 pg — AB (ref 26.0–34.0)
MCHC: 29.8 g/dL — ABNORMAL LOW (ref 30.0–36.0)
MCV: 86.9 fL (ref 78.0–100.0)
PLATELETS: 168 10*3/uL (ref 150–400)
RBC: 3.74 MIL/uL — ABNORMAL LOW (ref 3.87–5.11)
RDW: 15.4 % (ref 11.5–15.5)
WBC: 10 10*3/uL (ref 4.0–10.5)

## 2017-05-19 NOTE — Progress Notes (Signed)
Orthopedics Progress Note  Subjective: Stable overnight. Pain controlled  Objective:  Vitals:   05/19/17 0831 05/19/17 0910  BP: (!) 111/45 (!) 111/45  Pulse: 92 92  Resp: 18   Temp: 98.6 F (37 C)   SpO2: 92%     General: Awake and alert  Musculoskeletal: right knee dressing CDI, no calf swelling or tenderness Neurovascularly intact  Lab Results  Component Value Date   WBC 10.0 05/19/2017   HGB 9.7 (L) 05/19/2017   HCT 32.5 (L) 05/19/2017   MCV 86.9 05/19/2017   PLT 168 05/19/2017       Component Value Date/Time   NA 138 05/19/2017 0348   NA 140 01/22/2017 0946   K 3.5 05/19/2017 0348   K 4.1 01/22/2017 0946   CL 103 05/19/2017 0348   CO2 26 05/19/2017 0348   CO2 29 01/22/2017 0946   GLUCOSE 130 (H) 05/19/2017 0348   GLUCOSE 101 01/22/2017 0946   BUN 17 05/19/2017 0348   BUN 17.9 01/22/2017 0946   CREATININE 0.84 05/19/2017 0348   CREATININE 0.8 01/22/2017 0946   CALCIUM 8.6 (L) 05/19/2017 0348   CALCIUM 9.1 01/22/2017 0946   GFRNONAA >60 05/19/2017 0348   GFRAA >60 05/19/2017 0348    Lab Results  Component Value Date   INR 1.0 01/31/2016   INR 0.96 11/28/2010    Assessment/Plan: POD #1 s/p Procedure(s): RIGHT TOTAL KNEE ARTHROPLASTY Mobilization with therapy Plan for SNF at [facility] on Monday Will need to focus on extension today  Remo Lipps R. Veverly Fells, MD 05/19/2017 9:14 AM

## 2017-05-19 NOTE — Progress Notes (Signed)
Pt refused to have a bone foam, SCDs, ice packs for the bedtime. Pt was educated with no success. Nursing will continue to offer them. Knee immobilizer is on.

## 2017-05-19 NOTE — Evaluation (Signed)
Occupational Therapy Evaluation Patient Details Name: Kathy Murphy MRN: [id number] DOB: 1928/02/24 Today's Date: 05/19/2017    History of Present Illness Pt is an 84 y/o female s/p elective R TKA. PMH includes R breast cancer s/p mastectomy, thyroid cancer, HTN, MI, and L THA.    Clinical Impression   Pt reports she was independent with BADL PTA. Currently pt overall mod assist +2 for functional mobility and max assist for LB ADL. Recommending SNF for follow up to maximize independence and safety with ADL and functional mobility prior to return home alone. Pt would benefit from continued skilled OT to address established goals.    Follow Up Recommendations  SNF;Supervision/Assistance - 24 hour    Equipment Recommendations  Other (comment)(TBD at next venue)    Recommendations for Other Services       Precautions / Restrictions Precautions Precautions: Knee Precaution Booklet Issued: No Precaution Comments: knee precautions reviewed with pt Required Braces or Orthoses: Knee Immobilizer - Right Knee Immobilizer - Right: Other (comment)(until discontinued) Restrictions Weight Bearing Restrictions: Yes RLE Weight Bearing: Weight bearing as tolerated      Mobility Bed Mobility               General bed mobility comments: pt OOB in chair upon arrival  Transfers Overall transfer level: Needs assistance Equipment used: Rolling walker (2 wheeled) Transfers: Sit to/from Stand Sit to Stand: Mod assist;Min assist        General transfer comment: Pt. R knee was slightly buckling causing impaired balance. mod A to stand from recliner and min A from [facility]; cues for safe hand placement and to use RW     Balance Overall balance assessment: Needs assistance Sitting-balance support: Feet supported;No upper extremity supported Sitting balance-Leahy Scale: Good     Standing balance support: Single extremity supported;During functional activity Standing balance-Leahy Scale:  Poor Standing balance comment: Mod assist for standing balance when pulling up underwear in standing                           ADL either performed or assessed with clinical judgement   ADL Overall ADL's : Needs assistance/impaired Eating/Feeding: Set up;Sitting   Grooming: Set up;Sitting   Upper Body Bathing: Set up;Sitting   Lower Body Bathing: Maximal assistance;Cueing for safety;Cueing for compensatory techniques   Upper Body Dressing : Set up;Sitting   Lower Body Dressing: Cueing for safety;Cueing for compensatory techniques;Sit to/from stand;Maximal assistance Lower Body Dressing Details (indicate cue type and reason): Pt. required max assist donning/doffing socks/shoes to engage in functional mobility. Toilet Transfer: RW;BSC;Ambulation;Cueing for safety;Moderate assistance;+2 for safety/equipment   Toileting- Clothing Manipulation and Hygiene: Moderate assistance;Cueing for safety;Cueing for compensatory techniques;Sit to/from stand Toileting - Clothing Manipulation Details (indicate cue type and reason): Pt. required assistance managing underwear during toileting, able to perform peri care without assist but requires mod assist for standing balance.   Tub/Shower Transfer Details (indicate cue type and reason):   Functional mobility during ADLs: Moderate assistance;Cueing for safety;Rolling walker;+2 for physical assistance;+2 for safety/equipment General ADL Comments: Pt. required verbal and tactile cues for posture, placement of walker, and for placement of LEs during functional mobility.      Vision         Perception     Praxis      Pertinent Vitals/Pain Pain Assessment: Faces Faces Pain Scale: Hurts little more Pain Location: R knee  Pain Descriptors / Indicators: Aching;Discomfort;Sore Pain Intervention(s): Limited activity within patient's tolerance;Monitored  during session;Ice applied     Hand Dominance     Extremity/Trunk Assessment Upper  Extremity Assessment Upper Extremity Assessment: Generalized weakness   Lower Extremity Assessment Lower Extremity Assessment: Defer to PT evaluation   Cervical / Trunk Assessment Cervical / Trunk Assessment: Kyphotic   Communication Communication Communication: No difficulties   Cognition Arousal/Alertness: Awake/alert Behavior During Therapy: WFL for tasks assessed/performed Overall Cognitive Status: Within Functional Limits for tasks assessed                                     General Comments       Exercises    Shoulder Instructions      Home Living Family/patient expects to be discharged to:: Skilled nursing facility Living Arrangements: Alone                                      Prior Functioning/Environment Level of Independence: Independent                 OT Problem List: Decreased strength;Decreased range of motion;Decreased activity tolerance;Impaired balance (sitting and/or standing);Decreased safety awareness;Decreased knowledge of use of DME or AE;Decreased knowledge of precautions;Pain      OT Treatment/Interventions: Self-care/ADL training;Energy conservation;DME and/or AE instruction;Therapeutic activities;Patient/family education;Balance training    OT Goals(Current goals can be found in the care plan section) Acute Rehab OT Goals Patient Stated Goal: to go to SNF before going home  OT Goal Formulation: With patient Time For Goal Achievement: 06/02/17 Potential to Achieve Goals: Good ADL Goals Pt Will Perform Lower Body Bathing: with min guard assist;with adaptive equipment;sit to/from stand Pt Will Perform Lower Body Dressing: with min guard assist;sit to/from stand;with adaptive equipment Pt Will Transfer to Toilet: with min guard assist;ambulating;bedside commode Pt Will Perform Toileting - Clothing Manipulation and hygiene: with min guard assist;sit to/from stand  OT Frequency: Min 2X/week   Barriers to  D/C: Decreased caregiver support  pt lives alone       Co-evaluation              AM-PAC PT "6 Clicks" Daily Activity     Outcome Measure Help from another person eating meals?: None Help from another person taking care of personal grooming?: A Little Help from another person toileting, which includes using toliet, bedpan, or urinal?: A Lot Help from another person bathing (including washing, rinsing, drying)?: A Lot Help from another person to put on and taking off regular upper body clothing?: A Little Help from another person to put on and taking off regular lower body clothing?: A Lot 6 Click Score: 16   End of Session Equipment Utilized During Treatment: Rolling walker;Gait belt CPM Right Knee CPM Right Knee: Off  Activity Tolerance: Patient tolerated treatment well;Patient limited by fatigue Patient left: in chair;with call bell/phone within reach  OT Visit Diagnosis: Other abnormalities of gait and mobility (R26.89);Unsteadiness on feet (R26.81);Pain Pain - Right/Left: Right Pain - part of body: Knee                Time: 6967-8938 OT Time Calculation (min): 27 min Charges:  OT General Charges $OT Visit: 1 Visit OT Evaluation $OT Eval Moderate Complexity: 1 Mod G-Codes:     Tanga Gloor A. Ulice Brilliant, M.S., OTR/L Pager: [place] 05/19/2017, 1:57 PM

## 2017-05-19 NOTE — Anesthesia Postprocedure Evaluation (Signed)
Anesthesia Post Note  Patient: ADDILYNNE OLHEISER  Procedure(s) Performed: RIGHT TOTAL KNEE ARTHROPLASTY (Right Knee)     Patient location during evaluation: PACU Anesthesia Type: Regional, MAC and Spinal Level of consciousness: awake and alert Pain management: pain level controlled Vital Signs Assessment: post-procedure vital signs reviewed and stable Respiratory status: spontaneous breathing, nonlabored ventilation, respiratory function stable and patient connected to nasal cannula oxygen Cardiovascular status: stable and blood pressure returned to baseline Postop Assessment: no apparent nausea or vomiting and spinal receding Anesthetic complications: no    Last Vitals:  Vitals:   05/19/17 0910 05/19/17 1315  BP: (!) 111/45 (!) 107/45  Pulse: 92 92  Resp:  18  Temp:  37.2 C  SpO2:  92%    Last Pain:  Vitals:   05/19/17 1315  TempSrc: Oral  PainSc:                  Lachlyn Vanderstelt

## 2017-05-19 NOTE — Progress Notes (Signed)
Physical Therapy Treatment Patient Details Name: Kathy Murphy MRN: [id number] DOB: 1927-09-18 Today's Date: 05/19/2017    History of Present Illness Pt is an 84 y/o female s/p elective R TKA. PMH includes R breast cancer s/p mastectomy, thyroid cancer, HTN, MI, and L THA.     PT Comments    Patient is making progress toward mobility goals. Pt required +2 assistance for gait training and min/mod A for functional transfers. Continue to progress as tolerated with anticipated d/c to SNF for further skilled PT services.    Follow Up Recommendations  Follow surgeon's recommendation for DC plan and follow-up therapies;Supervision for mobility/OOB     Equipment Recommendations  None recommended by PT    Recommendations for Other Services OT consult     Precautions / Restrictions Precautions Precautions: Knee Precaution Comments: knee precautions reviewed with pt Required Braces or Orthoses: Knee Immobilizer - Right Knee Immobilizer - Right: Other (comment)(until discontinued ) Restrictions Weight Bearing Restrictions: Yes RLE Weight Bearing: Weight bearing as tolerated    Mobility  Bed Mobility               General bed mobility comments: pt OOB in chair upon arrival  Transfers Overall transfer level: Needs assistance Equipment used: Rolling walker (2 wheeled) Transfers: Sit to/from [facility] Sit to Stand: Mod assist;Min assist Stand pivot transfers: Min assist       General transfer comment: mod A to stand from recliner and min A from [facility]; cues for safe hand placement and to use RW throughout pivot transfer  Ambulation/Gait Ambulation/Gait assistance: Mod assist;+2 safety/equipment Ambulation Distance (Feet): (68ft then 35ft) Assistive device: Rolling walker (2 wheeled) Gait Pattern/deviations: Step-to pattern;Decreased stance time - right;Decreased step length - left;Antalgic;Trunk flexed Gait velocity: decreased   General Gait Details:  multimodal cues for R quad activation furing stance phase, posture/forward gaze, and safe proximity to RW; pt fatigues quickly and with difficulaty advancing R LE at times; appears to have leg length discrepency   Stairs            Wheelchair Mobility    Modified Rankin (Stroke Patients Only)       Balance Overall balance assessment: Needs assistance Sitting-balance support: No upper extremity supported;Feet supported Sitting balance-Leahy Scale: Good     Standing balance support: Bilateral upper extremity supported Standing balance-Leahy Scale: Poor                              Cognition Arousal/Alertness: Awake/alert Behavior During Therapy: WFL for tasks assessed/performed Overall Cognitive Status: Within Functional Limits for tasks assessed                                        Exercises Total Joint Exercises Quad Sets: AROM;Right;10 reps Straight Leg Raises: AROM;Right;10 reps    General Comments        Pertinent Vitals/Pain Pain Assessment: Faces Faces Pain Scale: Hurts little more Pain Location: R knee  Pain Descriptors / Indicators: Guarding;Sore Pain Intervention(s): Limited activity within patient's tolerance;Monitored during session;Premedicated before session;Repositioned;Ice applied    Home Living                      Prior Function            PT Goals (current goals can now be found in the care plan section)  Acute Rehab PT Goals Patient Stated Goal: to go to SNF before going home  PT Goal Formulation: With patient Time For Goal Achievement: 06/01/17 Potential to Achieve Goals: Good Progress towards PT goals: Progressing toward goals    Frequency    7X/week      PT Plan Current plan remains appropriate    Co-evaluation              AM-PAC PT "6 Clicks" Daily Activity  Outcome Measure  Difficulty turning over in bed (including adjusting bedclothes, sheets and blankets)?: A  Little Difficulty moving from lying on back to sitting on the side of the bed? : Unable Difficulty sitting down on and standing up from a chair with arms (e.g., wheelchair, bedside commode, etc,.)?: Unable Help needed moving to and from a bed to chair (including a wheelchair)?: A Lot Help needed walking in hospital room?: A Lot Help needed climbing 3-5 steps with a railing? : Total 6 Click Score: 10    End of Session Equipment Utilized During Treatment: Gait belt;Right knee immobilizer Activity Tolerance: Patient tolerated treatment well Patient left: with call bell/phone within reach;in chair;Other (comment)(KI donned and R foot in zero degree foam) Nurse Communication: Mobility status PT Visit Diagnosis: Other abnormalities of gait and mobility (R26.89)     Time: 8768-1157 PT Time Calculation (min) (ACUTE ONLY): 34 min  Charges:  $Gait Training: 8-22 mins                    G Codes:       Earney Navy, PTA Pager: [phone]     Darliss Cheney 05/19/2017, 11:30 AM

## 2017-05-20 LAB — CBC
HCT: 29.6 % — ABNORMAL LOW (ref 36.0–46.0)
HEMOGLOBIN: 9.1 g/dL — AB (ref 12.0–15.0)
MCH: 26.9 pg (ref 26.0–34.0)
MCHC: 30.7 g/dL (ref 30.0–36.0)
MCV: 87.6 fL (ref 78.0–100.0)
Platelets: 155 10*3/uL (ref 150–400)
RBC: 3.38 MIL/uL — ABNORMAL LOW (ref 3.87–5.11)
RDW: 16 % — AB (ref 11.5–15.5)
WBC: 9.2 10*3/uL (ref 4.0–10.5)

## 2017-05-20 NOTE — Progress Notes (Signed)
Patient is resting and denies pain at this time. No sign of distress. Patient refused her knee immobilizer and ice at this time.

## 2017-05-20 NOTE — Progress Notes (Signed)
Orthopedics Progress Note  Subjective: Patient states pain is controlled and she is doing better with her walking  Objective:  Vitals:   05/19/17 2202 05/20/17 0628  BP: (!) 106/40 (!) 121/48  Pulse: 70 72  Resp: 16   Temp: 99 F (37.2 C) 99 F (37.2 C)  SpO2: 96% 93%    General: Awake and alert  Musculoskeletal: right knee wound CDI, no erythema and minimal swelling, no cords, Neg Homan's Neurovascularly intact  Lab Results  Component Value Date   WBC 9.2 05/20/2017   HGB 9.1 (L) 05/20/2017   HCT 29.6 (L) 05/20/2017   MCV 87.6 05/20/2017   PLT 155 05/20/2017       Component Value Date/Time   NA 138 05/19/2017 0348   NA 140 01/22/2017 0946   K 3.5 05/19/2017 0348   K 4.1 01/22/2017 0946   CL 103 05/19/2017 0348   CO2 26 05/19/2017 0348   CO2 29 01/22/2017 0946   GLUCOSE 130 (H) 05/19/2017 0348   GLUCOSE 101 01/22/2017 0946   BUN 17 05/19/2017 0348   BUN 17.9 01/22/2017 0946   CREATININE 0.84 05/19/2017 0348   CREATININE 0.8 01/22/2017 0946   CALCIUM 8.6 (L) 05/19/2017 0348   CALCIUM 9.1 01/22/2017 0946   GFRNONAA >60 05/19/2017 0348   GFRAA >60 05/19/2017 0348    Lab Results  Component Value Date   INR 1.0 01/31/2016   INR 0.96 11/28/2010    Assessment/Plan: POD #2 s/p Procedure(s): RIGHT TOTAL KNEE ARTHROPLASTY Acute blood loss anemia - stable  Patient doing very well with her rehab to this point.  Awaiting discharge to SNF tomorrow [facility] Lange) DVT prophylaxis - ASA and mechanical (TED/SCDs) Dressing changed to Aquacel - may shower with it on.   Doran Heater. Veverly Fells, MD 05/20/2017 10:06 AM

## 2017-05-20 NOTE — Progress Notes (Signed)
Physical Therapy Treatment Patient Details Name: Kathy Murphy MRN: [id number] DOB: August 14, 1927 Today's Date: 05/20/2017    History of Present Illness Pt is an 84 y/o female s/p elective R TKA. PMH includes R breast cancer s/p mastectomy, thyroid cancer, HTN, MI, and L THA.     PT Comments    Pt with significant improvement from yesterday. Pt con't to have extremely pronated R foot and unable to achieve R terminal knee extension but was able to tolerate improved ambulation tolerance and requires less assistance for transfers and ambulation. Acute PT to con't to follow.   Follow Up Recommendations  Follow surgeon's recommendation for DC plan and follow-up therapies;Supervision for mobility/OOB     Equipment Recommendations  None recommended by PT    Recommendations for Other Services OT consult     Precautions / Restrictions Precautions Precautions: Knee Precaution Booklet Issued: No Precaution Comments: knee precautions reviewed with pt Required Braces or Orthoses: Knee Immobilizer - Right Knee Immobilizer - Right: Other (comment)(until discontinued) Restrictions Weight Bearing Restrictions: Yes RLE Weight Bearing: Weight bearing as tolerated    Mobility  Bed Mobility               General bed mobility comments: pt sitting on BSC in front of sink with RN tech upon PT arrival  Transfers Overall transfer level: Needs assistance Equipment used: Rolling walker (2 wheeled) Transfers: Sit to/from Stand Sit to Stand: Min assist         General transfer comment: v/c's to push up from arm rests, minA to power up and steady during transition of hands from arm rests to RW. completed 5 t/o session  Ambulation/Gait Ambulation/Gait assistance: +2 safety/equipment;Min assist Ambulation Distance (Feet): 65 Feet(x2) Assistive device: Rolling walker (2 wheeled) Gait Pattern/deviations: Step-to pattern;Decreased stance time - right;Decreased step length - left;Antalgic;Trunk  flexed Gait velocity: decreased Gait velocity interpretation: Below normal speed for age/gender General Gait Details: emphasis on progressing to steph through pattern, PT con't pushed RW to promote step through. v/c's to dec R LE step length to allow for step through pattern on the L. pt required 1 stead rest break.   Stairs            Wheelchair Mobility    Modified Rankin (Stroke Patients Only)       Balance Overall balance assessment: Needs assistance Sitting-balance support: Feet supported;No upper extremity supported Sitting balance-Leahy Scale: Good     Standing balance support: Single extremity supported;During functional activity Standing balance-Leahy Scale: Poor Standing balance comment: requires RW                            Cognition Arousal/Alertness: Awake/alert Behavior During Therapy: WFL for tasks assessed/performed Overall Cognitive Status: Within Functional Limits for tasks assessed                                        Exercises Total Joint Exercises Quad Sets: AROM;Right;10 reps Straight Leg Raises: AROM;Right;10 reps Goniometric ROM: 70 in sitting    General Comments        Pertinent Vitals/Pain Pain Assessment: Faces Faces Pain Scale: Hurts little more Pain Location: R knee  Pain Descriptors / Indicators: Aching;Discomfort;Sore Pain Intervention(s): Limited activity within patient's tolerance    Home Living  Prior Function            PT Goals (current goals can now be found in the care plan section) Acute Rehab PT Goals Patient Stated Goal: to go to SNF before going home  PT Goal Formulation: With patient Time For Goal Achievement: 06/01/17 Potential to Achieve Goals: Good Progress towards PT goals: Progressing toward goals    Frequency    7X/week      PT Plan Current plan remains appropriate    Co-evaluation              AM-PAC PT "6 Clicks" Daily  Activity  Outcome Measure  Difficulty turning over in bed (including adjusting bedclothes, sheets and blankets)?: A Little Difficulty moving from lying on back to sitting on the side of the bed? : Unable Difficulty sitting down on and standing up from a chair with arms (e.g., wheelchair, bedside commode, etc,.)?: Unable Help needed moving to and from a bed to chair (including a wheelchair)?: A Lot Help needed walking in hospital room?: A Lot Help needed climbing 3-5 steps with a railing? : Total 6 Click Score: 10    End of Session Equipment Utilized During Treatment: Gait belt;Right knee immobilizer Activity Tolerance: Patient tolerated treatment well Patient left: with call bell/phone within reach;in chair;Other (comment) Nurse Communication: Mobility status PT Visit Diagnosis: Other abnormalities of gait and mobility (R26.89)     Time: 7989-2119 PT Time Calculation (min) (ACUTE ONLY): 29 min  Charges:  $Gait Training: 23-37 mins                    G Codes:       Kittie Plater, PT, DPT Pager #: [phone] Office #: [phone]    [place] 05/20/2017, 1:14 PM

## 2017-05-20 NOTE — Progress Notes (Signed)
Orthopedic Tech Progress Note Patient Details:  Kathy Murphy [birth year] [id number]  CPM Right Knee CPM Right Knee: On Right Knee Flexion (Degrees): (60) Additional Comments: (immobolizer in place with bone foam.  Ice pack on)  Post Interventions Patient Tolerated: Refused intervention  [place] Pink 05/20/2017, 3:22 PM

## 2017-05-21 ENCOUNTER — Encounter (HOSPITAL_COMMUNITY): Payer: Self-pay | Admitting: Orthopedic Surgery

## 2017-05-21 DIAGNOSIS — D509 Iron deficiency anemia, unspecified: Secondary | ICD-10-CM | POA: Diagnosis not present

## 2017-05-21 DIAGNOSIS — Z471 Aftercare following joint replacement surgery: Secondary | ICD-10-CM | POA: Diagnosis not present

## 2017-05-21 DIAGNOSIS — C73 Malignant neoplasm of thyroid gland: Secondary | ICD-10-CM | POA: Diagnosis not present

## 2017-05-21 DIAGNOSIS — Z96651 Presence of right artificial knee joint: Secondary | ICD-10-CM | POA: Diagnosis not present

## 2017-05-21 DIAGNOSIS — I10 Essential (primary) hypertension: Secondary | ICD-10-CM | POA: Diagnosis not present

## 2017-05-21 DIAGNOSIS — M1711 Unilateral primary osteoarthritis, right knee: Secondary | ICD-10-CM | POA: Diagnosis not present

## 2017-05-21 DIAGNOSIS — C519 Malignant neoplasm of vulva, unspecified: Secondary | ICD-10-CM | POA: Diagnosis not present

## 2017-05-21 DIAGNOSIS — M1990 Unspecified osteoarthritis, unspecified site: Secondary | ICD-10-CM | POA: Diagnosis not present

## 2017-05-21 DIAGNOSIS — R32 Unspecified urinary incontinence: Secondary | ICD-10-CM | POA: Diagnosis not present

## 2017-05-21 DIAGNOSIS — E039 Hypothyroidism, unspecified: Secondary | ICD-10-CM | POA: Diagnosis not present

## 2017-05-21 DIAGNOSIS — Z9011 Acquired absence of right breast and nipple: Secondary | ICD-10-CM | POA: Diagnosis not present

## 2017-05-21 DIAGNOSIS — I119 Hypertensive heart disease without heart failure: Secondary | ICD-10-CM | POA: Diagnosis not present

## 2017-05-21 DIAGNOSIS — I252 Old myocardial infarction: Secondary | ICD-10-CM | POA: Diagnosis not present

## 2017-05-21 DIAGNOSIS — I2510 Atherosclerotic heart disease of native coronary artery without angina pectoris: Secondary | ICD-10-CM | POA: Diagnosis not present

## 2017-05-21 DIAGNOSIS — K219 Gastro-esophageal reflux disease without esophagitis: Secondary | ICD-10-CM | POA: Diagnosis not present

## 2017-05-21 DIAGNOSIS — E669 Obesity, unspecified: Secondary | ICD-10-CM | POA: Diagnosis not present

## 2017-05-21 DIAGNOSIS — Z87440 Personal history of urinary (tract) infections: Secondary | ICD-10-CM | POA: Diagnosis not present

## 2017-05-21 LAB — CBC
HCT: 28.5 % — ABNORMAL LOW (ref 36.0–46.0)
Hemoglobin: 8.8 g/dL — ABNORMAL LOW (ref 12.0–15.0)
MCH: 26.7 pg (ref 26.0–34.0)
MCHC: 30.9 g/dL (ref 30.0–36.0)
MCV: 86.6 fL (ref 78.0–100.0)
PLATELETS: 151 10*3/uL (ref 150–400)
RBC: 3.29 MIL/uL — ABNORMAL LOW (ref 3.87–5.11)
RDW: 15.8 % — ABNORMAL HIGH (ref 11.5–15.5)
WBC: 11.8 10*3/uL — ABNORMAL HIGH (ref 4.0–10.5)

## 2017-05-21 MED ORDER — ASPIRIN EC 81 MG PO TBEC: 81.0000 mg | DELAYED_RELEASE_TABLET | Freq: Every day | ORAL | Status: DC

## 2017-05-21 MED ORDER — ASPIRIN 81 MG PO CHEW: 81.0000 mg | CHEWABLE_TABLET | Freq: Two times a day (BID) | ORAL | 0 refills | Status: DC

## 2017-05-21 NOTE — Progress Notes (Signed)
Physical Therapy Treatment Patient Details Name: Kathy Murphy MRN: [id number] DOB: [birth year] Today's Date: 05/21/2017    History of Present Illness Pt is an 84 y/o female s/p elective R TKA. PMH includes R breast cancer s/p mastectomy, thyroid cancer, HTN, MI, and L THA.     PT Comments    Patient continues to progress toward mobility goals and is motivated to participate in therapy.  Continue to progress as tolerated with anticipated d/c to SNF for further skilled PT services.    Follow Up Recommendations  Follow surgeon's recommendation for DC plan and follow-up therapies;Supervision for mobility/OOB     Equipment Recommendations  None recommended by PT    Recommendations for Other Services OT consult     Precautions / Restrictions Precautions Precautions: Knee Precaution Comments: knee precautions reviewed with pt Required Braces or Orthoses: Knee Immobilizer - Right Knee Immobilizer - Right: Other (comment)(until discontinued) Restrictions Weight Bearing Restrictions: Yes RLE Weight Bearing: Weight bearing as tolerated    Mobility  Bed Mobility Overal bed mobility: Modified Independent Bed Mobility: Supine to Sit           General bed mobility comments: HOB elevated and increased effort  Transfers Overall transfer level: Needs assistance Equipment used: Rolling walker (2 wheeled) Transfers: Sit to/from Stand Sit to Stand: Min guard         General transfer comment: min guard for safety; cues for safe use of AD until reching surface to sit  Ambulation/Gait Ambulation/Gait assistance: Min assist Ambulation Distance (Feet): 100 Feet Assistive device: Rolling walker (2 wheeled) Gait Pattern/deviations: Step-to pattern;Decreased stance time - right;Decreased step length - left;Antalgic;Trunk flexed;Step-through pattern Gait velocity: decreased   General Gait Details: cues for posture, safe proximity to RW, and sequencing   Stairs             Wheelchair Mobility    Modified Rankin (Stroke Patients Only)       Balance Overall balance assessment: Needs assistance Sitting-balance support: Feet supported;No upper extremity supported Sitting balance-Leahy Scale: Good     Standing balance support: Single extremity supported;During functional activity Standing balance-Leahy Scale: Poor Standing balance comment: requires RW                            Cognition Arousal/Alertness: Awake/alert Behavior During Therapy: WFL for tasks assessed/performed Overall Cognitive Status: Within Functional Limits for tasks assessed                                        Exercises Total Joint Exercises Quad Sets: AROM;Right;10 reps Short Arc Quad: AROM;Right;10 reps Heel Slides: AROM;Right;10 reps Hip ABduction/ADduction: AROM;Right;10 reps Straight Leg Raises: AROM;Right;10 reps    General Comments        Pertinent Vitals/Pain Pain Assessment: Faces Faces Pain Scale: Hurts a little bit Pain Location: R knee with flexion Pain Descriptors / Indicators: Sore Pain Intervention(s): Monitored during session;Repositioned;Premedicated before session    Home Living                      Prior Function            PT Goals (current goals can now be found in the care plan section) Acute Rehab PT Goals PT Goal Formulation: With patient Time For Goal Achievement: 06/01/17 Potential to Achieve Goals: Good Progress towards PT goals: Progressing toward goals  Frequency    7X/week      PT Plan Current plan remains appropriate    Co-evaluation              AM-PAC PT "6 Clicks" Daily Activity  Outcome Measure  Difficulty turning over in bed (including adjusting bedclothes, sheets and blankets)?: A Little Difficulty moving from lying on back to sitting on the side of the bed? : A Lot Difficulty sitting down on and standing up from a chair with arms (e.g., wheelchair, bedside  commode, etc,.)?: Unable Help needed moving to and from a bed to chair (including a wheelchair)?: A Little Help needed walking in hospital room?: A Little Help needed climbing 3-5 steps with a railing? : A Lot 6 Click Score: 14    End of Session Equipment Utilized During Treatment: Gait belt Activity Tolerance: Patient tolerated treatment well Patient left: with call bell/phone within reach;in chair Nurse Communication: Mobility status PT Visit Diagnosis: Other abnormalities of gait and mobility (R26.89)     Time: 3887-1959 PT Time Calculation (min) (ACUTE ONLY): 38 min  Charges:  $Gait Training: 23-37 mins $Therapeutic Exercise: 8-22 mins                    G Codes:       Earney Navy, PTA Pager: [phone]     Darliss Cheney 05/21/2017, 9:33 AM

## 2017-05-21 NOTE — NC FL2 (Signed)
[place] LEVEL OF CARE SCREENING TOOL     IDENTIFICATION  Patient Name: Kathy Murphy Birthdate: 1927/12/01 Sex: female Admission Date (Current Location): 05/18/2017  [facility] and [place] Number:  Herbalist and Address:  The . [facility], [place] [address], [place], [place] [address]      Provider Number: [id number]  Attending Physician Name and Address:  Netta Cedars, MD  Relative Name and Phone Number:  Inocente Salles    Current Level of Care: Hospital Recommended Level of Care: [place] Prior Approval Number:    Date Approved/Denied:   PASRR Number: [id number]  Discharge Plan: SNF    Current Diagnoses: Patient Active Problem List   Diagnosis Date Noted  . Status post total knee replacement, right 05/18/2017  . Dyslipidemia 04/05/2017  . Obesity   . Myocardial infarction ([place])   . Incontinence of urine   . Hypertension   . Seroma of breast 01/08/2017  . Malignant neoplasm of upper-outer quadrant of right breast in female, estrogen receptor positive ([place]) 08/31/2016  . CHF (congestive heart failure), NYHA class I, chronic, combined ([place]) 04/25/2016  . Essential hypertension 04/25/2016  . CAD (coronary artery disease), native coronary artery 02/11/2016  . Chronic total occlusion of coronary artery   . Coronary artery disease   . Unstable angina pectoris ([employer])   . Chest pain 02/07/2016  . Abnormal nuclear cardiac imaging test 02/07/2016  . Nipple discharge in female 04/17/2011  . Heart disease, hypertensive   . Hypercholesterolemia   . Arthritis   . Unsteady gait   . Myalgia   . Pain in joints   . Retinal detachment   . Hypothyroidism   . GERD (gastroesophageal reflux disease)   . UTI (urinary tract infection)   . Vomiting   . Overweight (BMI 25.0-29.9)   . GI bleed     Orientation RESPIRATION BLADDER Height & Weight     Self, Time, Situation, Place  Normal Continent Weight:  183 lb (83 kg) Height:     BEHAVIORAL SYMPTOMS/MOOD NEUROLOGICAL BOWEL NUTRITION STATUS      Continent Diet(See DC Summary)  AMBULATORY STATUS COMMUNICATION OF NEEDS Skin   Limited Assist Verbally Surgical wounds                       Personal Care Assistance Level of Assistance  Dressing, Bathing, Feeding Bathing Assistance: Limited assistance Feeding assistance: Limited assistance Dressing Assistance: Limited assistance     Functional Limitations Info  Sight, Hearing, Speech Sight Info: Adequate Hearing Info: Adequate Speech Info: Adequate    SPECIAL CARE FACTORS FREQUENCY  PT (By licensed PT), OT (By licensed OT)     PT Frequency: 5x week OT Frequency: 5x week            Contractures      Additional Factors Info  Code Status, Allergies Code Status Info: Full Allergies Info: ADHESIVE TAPE, OTHER, LATEX            Current Medications (05/21/2017):  This is the current hospital active medication list Current Facility-Administered Medications  Medication Dose Route Frequency Provider Last Rate Last Dose  . 0.9 %  sodium chloride infusion   Intravenous Continuous Netta Cedars, MD      . acetaminophen (TYLENOL) tablet 325-650 mg  325-650 mg Oral Q6H PRN Netta Cedars, MD   325 mg at 05/19/17 2204  . anastrozole (ARIMIDEX) tablet 1 mg  1 mg Oral Daily Norris,  Richardson Landry, MD   1 mg at 05/21/17 1010  . aspirin chewable tablet 81 mg  81 mg Oral BID Netta Cedars, MD   81 mg at 05/21/17 1010  . aspirin EC tablet 81 mg  81 mg Oral Daily Netta Cedars, MD   81 mg at 05/21/17 1010  . diclofenac (VOLTAREN) EC tablet 50 mg  50 mg Oral BID Netta Cedars, MD   50 mg at 05/21/17 1010  . docusate sodium (COLACE) capsule 100 mg  100 mg Oral BID Netta Cedars, MD   100 mg at 05/21/17 1010  . ferrous sulfate tablet 325 mg  325 mg Oral TID Yolonda Kida, MD   325 mg at 05/21/17 0900  . hydrochlorothiazide (MICROZIDE) capsule 12.5 mg  12.5 mg Oral Once per day on Mon Wed Fri  Norris, Steve, MD      . HYDROcodone-acetaminophen [facility]) 7.5-325 MG per tablet 1-2 tablet  1-2 tablet Oral Q4H PRN Netta Cedars, MD   1 tablet at 05/21/17 0131  . HYDROcodone-acetaminophen (NORCO/VICODIN) 5-325 MG per tablet 1-2 tablet  1-2 tablet Oral Q4H PRN Netta Cedars, MD   2 tablet at 05/18/17 2209  . hydroxypropyl methylcellulose / hypromellose (ISOPTO TEARS / GONIOVISC) 2.5 % ophthalmic solution 1 drop  1 drop Both Eyes Daily Netta Cedars, MD   1 drop at 05/19/17 0913  . levothyroxine (SYNTHROID, LEVOTHROID) tablet 125 mcg  125 mcg Oral QAC breakfast Netta Cedars, MD   125 mcg at 05/20/17 [phone]  . losartan (COZAAR) tablet 100 mg  100 mg Oral Daily Netta Cedars, MD   100 mg at 05/20/17 1019  . menthol-cetylpyridinium (CEPACOL) lozenge 3 mg  1 lozenge Oral PRN Netta Cedars, MD       Or  . phenol (CHLORASEPTIC) mouth spray 1 spray  1 spray Mouth/Throat PRN Netta Cedars, MD      . metoCLOPramide (REGLAN) tablet 5-10 mg  5-10 mg Oral Q8H PRN Netta Cedars, MD       Or  . metoCLOPramide (REGLAN) injection 5-10 mg  5-10 mg Intravenous Q8H PRN Netta Cedars, MD      . metoprolol succinate (TOPROL-XL) 24 hr tablet 12.5 mg  12.5 mg Oral Daily Netta Cedars, MD   12.5 mg at 05/21/17 1009  . morphine 2 MG/ML injection 0.5-1 mg  0.5-1 mg Intravenous Q2H PRN Netta Cedars, MD   0.5 mg at 05/18/17 1945  . ondansetron (ZOFRAN) tablet 4 mg  4 mg Oral Q6H PRN Netta Cedars, MD       Or  . ondansetron [facility]) injection 4 mg  4 mg Intravenous Q6H PRN Netta Cedars, MD      . pantoprazole (PROTONIX) EC tablet 40 mg  40 mg Oral Daily Netta Cedars, MD   40 mg at 05/21/17 1010  . rosuvastatin (CRESTOR) tablet 10 mg  10 mg Oral Cherylynn Ridges, MD   10 mg at 05/21/17 0617  . traMADol (ULTRAM) tablet 50 mg  50 mg Oral Q6H Netta Cedars, MD   50 mg at 05/21/17 6283     Discharge Medications: Please see discharge summary for a list of discharge medications.  Relevant Imaging Results:  Relevant  Lab Results:   Additional Information SS#: [id number]  [place], LCSW

## 2017-05-21 NOTE — Op Note (Signed)
NAMELANEY, LOUDERBACK NO.:  [id number]  MEDICAL RECORD NO.:  [id number]  LOCATION:                                 FACILITY:  PHYSICIAN:  Doran Heater. Veverly Fells, M.D. DATE OF BIRTH:  1927-10-02  DATE OF PROCEDURE:  05/18/2017 DATE OF DISCHARGE:                              OPERATIVE REPORT   PREOPERATIVE DIAGNOSIS:  Right knee end-stage osteoarthritis.  POSTOPERATIVE DIAGNOSIS:  Right knee end-stage osteoarthritis.  PROCEDURE PERFORMED:  Right total knee arthroplasty using DePuy Sigma rotating platform prosthesis.  ATTENDING SURGEON:  Doran Heater. Veverly Fells, MD.  ASSISTANT:  Darol Destine, [facility], who was scrubbed during the entire procedure and necessary for satisfactory completion of surgery.  ANESTHESIA:  Spinal anesthesia was used plus adductor canal block.  ESTIMATED BLOOD LOSS:  Minimal.  FLUID REPLACEMENT:  1500 mL of crystalloid instrument.  INSTRUMENT COUNTS:  Correct.  COMPLICATIONS:  There were no complications.  Perioperative antibiotics were given.  INDICATIONS:  The patient is an 84 year old female with worsening right knee pain secondary to end-stage arthritis.  The patient has bone-on- bone findings on x-ray, who presents having failed conservative management over an extended period of time, desiring total knee arthroplasty to eliminate pain and restore function to her.  Risks and benefits of surgery were discussed in detail with the patient and medical clearance obtained.  Informed consent obtained.  DESCRIPTION OF PROCEDURE:  After an adequate level of anesthesia was achieved, the patient was positioned on the bed in the supine position on the operating room table.  A nonsterile tourniquet was placed on right proximal thigh.  Right leg was sterilely prepped and draped in usual manner.  Time-out called.  We elevated the leg and exsanguinated using an Esmarch bandage.  We then flexed the knee and performed a midline incision with a #10 blade  scalpel.  Dissection down through the subcutaneous tissues with a 10 blade.  We used a fresh #10 blade scalpel for the medial parapatellar arthrotomy.  We divided the lateral patellofemoral ligaments, everted the patella, entered the distal femur with a step-cut drill and due to flexion contracture, took 11 mm of bone off the affected right distal femur set on 5 degrees right.  Once we resected the distal femur, we sized the femur to size 4, anterior down. We needed 4 narrow for her, but we did our AP and chamfer cuts off the 4- in-1 block.  We were pleased with the resection and the sizing of the implant.  We then resected ACL, PCL, remaining meniscal tissue, subluxed the tibia anteriorly and cut the tibia 2 mm off the affected side with minimal posterior slope, 90 degrees perpendicular to the long axis of the tibia.  Once that was done with the oscillating saw, we irrigated thoroughly, checked our flexion and extension gaps which were symmetric. She was a little bit tight in extension, but we could get the 10 mm block in there.  We then completed our tibial preparation with a modular drill and keel punch.  We placed our trial tibia in place, which was a size 3.  We then addressed the femur and did our box cut for the posterior  cruciate substituting 4 narrow prosthesis.  We impacted that into place, reduced the knee with +10 poly, it was a 4, 10 mm.  Then, we went ahead and resurfaced our patella.  There was a deep groove and no cartilage at all on the patella and there was full-thickness cartilage loss in the other parts of the knee joint as well, but the patella was worse with a bad grooving.  We were able to resect down to 13 mm thickness with the oscillating saw to get to the base of the defect. Once we had that done, we drilled for the 35 patellar button and then we placed our trial 35 patellar button in place and ranged the knee.  We had nice patellar tracking with no-touch  technique.  We irrigated and removed the trial components, irrigated thoroughly, dried the bone and then vacuum mixed the high viscosity cement on the back table.  We then cemented the components in place, tibia size 3 and size 4 narrow right femur and then a 10-mm poly insert placed in there and the knee placed in extension.  We then used a patellar clamp to compress the patellar button in place and cemented that in place on the backside of the patella.  Once the cement was hardened, we removed excess cement with 0.25-inch curved osteotome.  We irrigated thoroughly.  We were pleased with the 10-mm trial.  We were able to get the full extension with just a little bit of help.  We removed the trial components and then selected the real size 4, 10-mm poly and inserted that in place and reduced the knee, had nice little snap medially and again able to get to full extension with a little bit of pressure on the front of the knee.  We irrigated thoroughly, nice and stable in flexion.  We then closed the parapatellar arthrotomy with #1 Vicryl suture interrupted followed by 0 Vicryl, 2-0 Vicryl subcutaneous closure and 4-0 Monocryl for skin. Steri-Strips applied followed by a sterile dressing.  The patient tolerated the surgery well.     Doran Heater. Veverly Fells, M.D.     SRN/MEDQ  D:  05/18/2017  T:  05/19/2017  Job:  [id number]

## 2017-05-21 NOTE — Discharge Summary (Addendum)
Orthopedic Discharge Summary        Physician Discharge Summary  Patient ID: Kathy Murphy MRN: [id number] DOB/AGE: 1928/01/20 84 y.o.  Admit date: 05/18/2017 Discharge date: 05/21/2017   Procedures:  Procedure(s) (LRB): RIGHT TOTAL KNEE ARTHROPLASTY (Right)  Attending Physician:  Dr. Esmond Plants  Admission Diagnoses:   Right knee primary OA, end stage  Discharge Diagnoses:  Right knee primary OA, end stage   Past Medical History:  Diagnosis Date  . Arthritis   . Breast cancer ([place])    Right  . Cancer ([place])    thyroid  . GERD (gastroesophageal reflux disease)   . GI bleed 1980   AFTER POLYP EXCISION  . Heart disease, hypertensive   . Hypercholesterolemia   . Hypertension   . Hypothyroidism   . Incontinence of urine   . Myalgia   . Myocardial infarction ([place])   . Obesity   . Pain in joints   . Retinal detachment   . Unsteady gait   . UTI (urinary tract infection)   . Vision loss of left eye   . Vomiting     PCP: Burnard Bunting, MD   Discharged Condition: good  Hospital Course:  Patient underwent the above stated procedure on 05/18/2017. Patient tolerated the procedure well and brought to the recovery room in good condition and subsequently to the floor. Patient had an uncomplicated hospital course and was stable for discharge to [facility] to continue her rehab.   Disposition: SNF [facility] with follow up in 2 weeks   Follow-up Information    Netta Cedars, MD. Call in 2 weeks.   Specialty:  Orthopedic Surgery Why:  [phone] Contact information: [address] [place] [address] [phone]           Discharge Instructions    Call MD / Call 911   Complete by:  As directed    If you experience chest pain or shortness of breath, CALL 911 and be transported to the hospital emergency room.  If you develope a fever above 101 F, pus (white drainage) or increased drainage or redness at the wound, or calf pain, call your  surgeon's office.   Constipation Prevention   Complete by:  As directed    Drink plenty of fluids.  Prune juice may be helpful.  You may use a stool softener, such as Colace (over the counter) 100 mg twice a day.  Use MiraLax (over the counter) for constipation as needed.   Diet - low sodium heart healthy   Complete by:  As directed    Increase activity slowly as tolerated   Complete by:  As directed       Allergies as of 05/21/2017      Reactions   Adhesive [tape] Other (See Comments)   Other Other (See Comments)   SOME OF THE "MYCINS"...UNKNOWN REACTION.   Latex Rash      Medication List    STOP taking these medications   gabapentin 100 MG capsule Commonly known as:  NEURONTIN     TAKE these medications   acetaminophen 650 MG CR tablet Commonly known as:  TYLENOL Take 1,300 mg by mouth every 8 (eight) hours as needed for pain.   anastrozole 1 MG tablet Commonly known as:  ARIMIDEX Take 1 tablet (1 mg total) by mouth daily.   aspirin EC 81 MG tablet Take 81 mg by mouth daily. What changed:  Another medication with the same name was added. Make sure you understand  how and when to take each.   aspirin 81 MG chewable tablet Chew 1 tablet (81 mg total) by mouth 2 (two) times daily. What changed:  You were already taking a medication with the same name, and this prescription was added. Make sure you understand how and when to take each.   Diclofenac Sodium CR 100 MG 24 hr tablet Take 100 mg by mouth daily.   hydrochlorothiazide 12.5 MG capsule Commonly known as:  MICROZIDE Take 1 capsule (12.5 mg total) by mouth 3 (three) times a week.   levothyroxine 125 MCG tablet Commonly known as:  SYNTHROID, LEVOTHROID Take 125 mcg by mouth daily before breakfast.   losartan 100 MG tablet Commonly known as:  COZAAR Take 100 mg by mouth daily.   metoprolol succinate 100 MG 24 hr tablet Commonly known as:  TOPROL-XL TAKE 1 TABLET DAILY. TAKE WITH OR IMMEDIATELY FOLLOWING A  MEAL   oxyCODONE 5 MG immediate release tablet Commonly known as:  ROXICODONE Take 1 tablet (5 mg total) by mouth every 4 (four) hours as needed for severe pain.   pantoprazole 40 MG tablet Commonly known as:  PROTONIX Take 40 mg by mouth daily.   REFRESH TEARS OP Apply 1 drop to eye daily.   rosuvastatin 10 MG tablet Commonly known as:  CRESTOR Take 10 mg by mouth every morning.      Clarification on Aspirin  81 mg chewable aspirin to be taken twice per day for 30 days post op. The once daily aspirin that she took prior to surgery will be resumed after she finishes with the 30 days of twice daily, roughly April 11th    Signed: Diann Bangerter,STEVEN R 05/21/2017, 11:22 AM  [place] is now [address] [place], [place], [place] [address] Phone: [place]

## 2017-05-21 NOTE — Clinical Social Work Note (Signed)
Clinical Social Work Assessment  Patient Details  Name: Kathy Murphy MRN: [id number] Date of Birth: [birth year]  Date of referral:  05/21/17               Reason for consult:  Facility Placement                Permission sought to share information with:  Facility Art therapist granted to share information::  Yes, Verbal Permission Granted  Name::        Agency::  SNF  Relationship::     Contact Information:     Housing/Transportation Living arrangements for the past 2 months:  Single Family Home Source of Information:  Patient Patient Interpreter Needed:  None Criminal Activity/Legal Involvement Pertinent to Current Situation/Hospitalization:  No - Comment as needed Significant Relationships:  Friend, Other Family Members Lives with:  Self Do you feel safe going back to the place where you live?  No Need for family participation in patient care:  No (Coment)  Care giving concerns:  Pt from home alone. Pt agreeable to SNF. Pt indicated that prior to hospitalization she was independent with most of her ADL's and hired a caregiver to assist as needed. CSW obtained permission to send referral to [facility] as she pre-arranged to go to SNF. Pt indicated that her friend [name] will transport to SNF.  CSW sent all dc summary to SNF.  Social Worker assessment / plan:  CSW will f/u for disposition.  Employment status:  Retired Forensic scientist:  Medicare PT Recommendations:  [place] / Referral to community resources:  [place]  Patient/Family's Response to care:  Pt appreciative that CSW met to discuss disposition. Pt thanked CSW for assistance as she has already set up transport with friend and has no needs.  Patient/Family's Understanding of and Emotional Response to Diagnosis, Current Treatment, and Prognosis:  Pt agreeable to SNF and has prearranged for skilled nursing. Pt acknowledge that she will need more  assistance at home given new impairment. Pt plan is to completed short term rehab and return home. No issues or concerns identified.  Emotional Assessment Appearance:  Appears stated age Attitude/Demeanor/Rapport:  (Cooperative) Affect (typically observed):  Accepting, Appropriate Orientation:  Oriented to Situation, Oriented to  Time, Oriented to Place, Oriented to Self Alcohol / Substance use:  Not Applicable Psych involvement (Current and /or in the community):  No (Comment)  Discharge Needs  Concerns to be addressed:  Discharge Planning Concerns Readmission within the last 30 days:  No Current discharge risk:  Dependent with Mobility, Physical Impairment Barriers to Discharge:  No Barriers Identified   Normajean Baxter, LCSW 05/21/2017, 11:20 AM

## 2017-05-21 NOTE — Clinical Social Work Placement (Signed)
   CLINICAL SOCIAL WORK PLACEMENT  NOTE  Date:  05/21/2017  Patient Details  Name: Kathy Murphy MRN: [id number] Date of Birth: 06/21/1927  Clinical Social Work is seeking post-discharge placement for this patient at the [place] level of care (*CSW will initial, date and re-position this form in  chart as items are completed):  Yes   Patient/family provided with [place] Work Department's list of facilities offering this level of care within the geographic area requested by the patient (or if unable, by the patient's family).  Yes   Patient/family informed of their freedom to choose among providers that offer the needed level of care, that participate in Medicare, Medicaid or managed care program needed by the patient, have an available bed and are willing to accept the patient.  Yes   Patient/family informed of [facility]'s ownership interest in [facility] and [facility], as well as of the fact that they are under no obligation to receive care at these facilities.  PASRR submitted to EDS on       PASRR number received on 05/21/17     Existing PASRR number confirmed on       FL2 transmitted to all facilities in geographic area requested by pt/family on 05/21/17     FL2 transmitted to all facilities within larger geographic area on       Patient informed that his/her managed care company has contracts with or will negotiate with certain facilities, including the following:        Yes   Patient/family informed of bed offers received.  Patient chooses bed at [facility] recommends and patient chooses bed at      Patient to be transferred to [facility] [place] on 05/21/17.  Patient to be transferred to facility by [facility]     Patient family notified on 05/21/17 of transfer.  Name of family member notified:  pt responsible for self     PHYSICIAN       Additional Comment:     _______________________________________________ Normajean Baxter, LCSW 05/21/2017, 11:20 AM

## 2017-05-21 NOTE — Social Work (Signed)
Clinical Social Worker facilitated patient discharge including contacting patient family and facility to confirm patient discharge plans.  Clinical information faxed to facility and family agreeable with plan.    Pt indicated that [name] will transport her to [facility] [place].    RN to call [phone] to give report prior to discharge. Pt going to Room 7006.  Clinical Social Worker will sign off for now as social work intervention is no longer needed. Please consult [place] again if new need arises.  Elissa Hefty, LCSW Clinical Social Worker [phone]

## 2017-05-21 NOTE — Discharge Instructions (Signed)
Ice to the knee as much as you can.  Prop under the heel/calf to allow the knee to rest out straight.  Wear the knee immobilizer at night to maintain straightening.  Do exercises every hour while awake.  Ok to put full weight on the right leg.   Keep the incision clean and dry and covered for one week, then ok to get it wet in the shower.  Please take chewable baby aspirin (81mg ) twice daily for 30 days to prevent blood clots  Call the office for follow up appt in two weeks. [phone]  Call Leonides Grills with any questions at [phone]

## 2017-05-21 NOTE — Progress Notes (Signed)
Patient discharged to [facility] via car.  Patient's friend, [name] is with her. Report called to [place] (nurse)  Written instructions with prescriptions sent with the patient.  Patient is discharged via wheel chair to the car.

## 2017-05-21 NOTE — Progress Notes (Signed)
Orthopedics Progress Note  Subjective: Patient reports doing well with therapy and that she is ready to go to rehab  Objective:  Vitals:   05/21/17 0415 05/21/17 1009  BP: 123/62 123/62  Pulse: 97 97  Resp: 19   Temp: 97.7 F (36.5 C)   SpO2: 99%     General: Awake and alert  Musculoskeletal: right knee incision CDI, Aquacel in place Neurovascularly intact  Lab Results  Component Value Date   WBC 11.8 (H) 05/21/2017   HGB 8.8 (L) 05/21/2017   HCT 28.5 (L) 05/21/2017   MCV 86.6 05/21/2017   PLT 151 05/21/2017       Component Value Date/Time   NA 138 05/19/2017 0348   NA 140 01/22/2017 0946   K 3.5 05/19/2017 0348   K 4.1 01/22/2017 0946   CL 103 05/19/2017 0348   CO2 26 05/19/2017 0348   CO2 29 01/22/2017 0946   GLUCOSE 130 (H) 05/19/2017 0348   GLUCOSE 101 01/22/2017 0946   BUN 17 05/19/2017 0348   BUN 17.9 01/22/2017 0946   CREATININE 0.84 05/19/2017 0348   CREATININE 0.8 01/22/2017 0946   CALCIUM 8.6 (L) 05/19/2017 0348   CALCIUM 9.1 01/22/2017 0946   GFRNONAA >60 05/19/2017 0348   GFRAA >60 05/19/2017 0348    Lab Results  Component Value Date   INR 1.0 01/31/2016   INR 0.96 11/28/2010    Assessment/Plan: POD #3 s/p Procedure(s): RIGHT TOTAL KNEE ARTHROPLASTY Discharge to SNF/[facility] - follow up in two weeks with Dr Veverly Fells in the office   Remo Lipps R. Veverly Fells, MD 05/21/2017 11:18 AM

## 2017-05-21 NOTE — Progress Notes (Signed)
Patient called nurse and said she does not want the bone foam and that it was causing her pain. Pain med administered.

## 2017-05-21 NOTE — Progress Notes (Signed)
Patient agreed to put knee immobilizer on and use the bone foam.

## 2017-05-23 DIAGNOSIS — D509 Iron deficiency anemia, unspecified: Secondary | ICD-10-CM | POA: Diagnosis not present

## 2017-05-23 DIAGNOSIS — E039 Hypothyroidism, unspecified: Secondary | ICD-10-CM | POA: Diagnosis not present

## 2017-05-23 DIAGNOSIS — M1711 Unilateral primary osteoarthritis, right knee: Secondary | ICD-10-CM | POA: Diagnosis not present

## 2017-05-23 DIAGNOSIS — I2510 Atherosclerotic heart disease of native coronary artery without angina pectoris: Secondary | ICD-10-CM | POA: Diagnosis not present

## 2017-05-23 DIAGNOSIS — I10 Essential (primary) hypertension: Secondary | ICD-10-CM | POA: Diagnosis not present

## 2017-05-30 DIAGNOSIS — Z5189 Encounter for other specified aftercare: Secondary | ICD-10-CM | POA: Insufficient documentation

## 2017-05-30 DIAGNOSIS — Z96651 Presence of right artificial knee joint: Secondary | ICD-10-CM | POA: Diagnosis not present

## 2017-05-30 DIAGNOSIS — Z471 Aftercare following joint replacement surgery: Secondary | ICD-10-CM | POA: Diagnosis not present

## 2017-06-04 DIAGNOSIS — Z96651 Presence of right artificial knee joint: Secondary | ICD-10-CM | POA: Diagnosis not present

## 2017-06-04 DIAGNOSIS — M6281 Muscle weakness (generalized): Secondary | ICD-10-CM | POA: Diagnosis not present

## 2017-06-04 DIAGNOSIS — Z471 Aftercare following joint replacement surgery: Secondary | ICD-10-CM | POA: Diagnosis not present

## 2017-06-04 DIAGNOSIS — M25661 Stiffness of right knee, not elsewhere classified: Secondary | ICD-10-CM | POA: Diagnosis not present

## 2017-06-04 DIAGNOSIS — R2689 Other abnormalities of gait and mobility: Secondary | ICD-10-CM | POA: Diagnosis not present

## 2017-06-06 DIAGNOSIS — M25661 Stiffness of right knee, not elsewhere classified: Secondary | ICD-10-CM | POA: Diagnosis not present

## 2017-06-06 DIAGNOSIS — M6281 Muscle weakness (generalized): Secondary | ICD-10-CM | POA: Diagnosis not present

## 2017-06-06 DIAGNOSIS — Z96651 Presence of right artificial knee joint: Secondary | ICD-10-CM | POA: Diagnosis not present

## 2017-06-06 DIAGNOSIS — Z471 Aftercare following joint replacement surgery: Secondary | ICD-10-CM | POA: Diagnosis not present

## 2017-06-06 DIAGNOSIS — R2689 Other abnormalities of gait and mobility: Secondary | ICD-10-CM | POA: Diagnosis not present

## 2017-06-07 DIAGNOSIS — C50911 Malignant neoplasm of unspecified site of right female breast: Secondary | ICD-10-CM | POA: Diagnosis not present

## 2017-06-08 DIAGNOSIS — R2689 Other abnormalities of gait and mobility: Secondary | ICD-10-CM | POA: Diagnosis not present

## 2017-06-08 DIAGNOSIS — Z471 Aftercare following joint replacement surgery: Secondary | ICD-10-CM | POA: Diagnosis not present

## 2017-06-08 DIAGNOSIS — Z96651 Presence of right artificial knee joint: Secondary | ICD-10-CM | POA: Diagnosis not present

## 2017-06-08 DIAGNOSIS — M6281 Muscle weakness (generalized): Secondary | ICD-10-CM | POA: Diagnosis not present

## 2017-06-08 DIAGNOSIS — M25661 Stiffness of right knee, not elsewhere classified: Secondary | ICD-10-CM | POA: Diagnosis not present

## 2017-06-12 DIAGNOSIS — M6281 Muscle weakness (generalized): Secondary | ICD-10-CM | POA: Diagnosis not present

## 2017-06-12 DIAGNOSIS — Z96651 Presence of right artificial knee joint: Secondary | ICD-10-CM | POA: Diagnosis not present

## 2017-06-12 DIAGNOSIS — M25661 Stiffness of right knee, not elsewhere classified: Secondary | ICD-10-CM | POA: Diagnosis not present

## 2017-06-12 DIAGNOSIS — Z471 Aftercare following joint replacement surgery: Secondary | ICD-10-CM | POA: Diagnosis not present

## 2017-06-12 DIAGNOSIS — R2689 Other abnormalities of gait and mobility: Secondary | ICD-10-CM | POA: Diagnosis not present

## 2017-06-13 DIAGNOSIS — Z471 Aftercare following joint replacement surgery: Secondary | ICD-10-CM | POA: Diagnosis not present

## 2017-06-13 DIAGNOSIS — R2689 Other abnormalities of gait and mobility: Secondary | ICD-10-CM | POA: Diagnosis not present

## 2017-06-13 DIAGNOSIS — M6281 Muscle weakness (generalized): Secondary | ICD-10-CM | POA: Diagnosis not present

## 2017-06-13 DIAGNOSIS — M25661 Stiffness of right knee, not elsewhere classified: Secondary | ICD-10-CM | POA: Diagnosis not present

## 2017-06-13 DIAGNOSIS — Z96651 Presence of right artificial knee joint: Secondary | ICD-10-CM | POA: Diagnosis not present

## 2017-06-15 DIAGNOSIS — R2689 Other abnormalities of gait and mobility: Secondary | ICD-10-CM | POA: Diagnosis not present

## 2017-06-15 DIAGNOSIS — Z96651 Presence of right artificial knee joint: Secondary | ICD-10-CM | POA: Diagnosis not present

## 2017-06-15 DIAGNOSIS — M6281 Muscle weakness (generalized): Secondary | ICD-10-CM | POA: Diagnosis not present

## 2017-06-15 DIAGNOSIS — Z471 Aftercare following joint replacement surgery: Secondary | ICD-10-CM | POA: Diagnosis not present

## 2017-06-15 DIAGNOSIS — M25661 Stiffness of right knee, not elsewhere classified: Secondary | ICD-10-CM | POA: Diagnosis not present

## 2017-06-18 DIAGNOSIS — Z471 Aftercare following joint replacement surgery: Secondary | ICD-10-CM | POA: Diagnosis not present

## 2017-06-18 DIAGNOSIS — M25661 Stiffness of right knee, not elsewhere classified: Secondary | ICD-10-CM | POA: Diagnosis not present

## 2017-06-18 DIAGNOSIS — R2689 Other abnormalities of gait and mobility: Secondary | ICD-10-CM | POA: Diagnosis not present

## 2017-06-18 DIAGNOSIS — M6281 Muscle weakness (generalized): Secondary | ICD-10-CM | POA: Diagnosis not present

## 2017-06-18 DIAGNOSIS — Z96651 Presence of right artificial knee joint: Secondary | ICD-10-CM | POA: Diagnosis not present

## 2017-06-20 DIAGNOSIS — M6281 Muscle weakness (generalized): Secondary | ICD-10-CM | POA: Diagnosis not present

## 2017-06-20 DIAGNOSIS — M25661 Stiffness of right knee, not elsewhere classified: Secondary | ICD-10-CM | POA: Diagnosis not present

## 2017-06-20 DIAGNOSIS — Z471 Aftercare following joint replacement surgery: Secondary | ICD-10-CM | POA: Diagnosis not present

## 2017-06-20 DIAGNOSIS — Z96651 Presence of right artificial knee joint: Secondary | ICD-10-CM | POA: Diagnosis not present

## 2017-06-20 DIAGNOSIS — R2689 Other abnormalities of gait and mobility: Secondary | ICD-10-CM | POA: Diagnosis not present

## 2017-06-25 DIAGNOSIS — Z96651 Presence of right artificial knee joint: Secondary | ICD-10-CM | POA: Diagnosis not present

## 2017-06-25 DIAGNOSIS — R2689 Other abnormalities of gait and mobility: Secondary | ICD-10-CM | POA: Diagnosis not present

## 2017-06-25 DIAGNOSIS — M25661 Stiffness of right knee, not elsewhere classified: Secondary | ICD-10-CM | POA: Diagnosis not present

## 2017-06-25 DIAGNOSIS — Z471 Aftercare following joint replacement surgery: Secondary | ICD-10-CM | POA: Diagnosis not present

## 2017-06-25 DIAGNOSIS — M6281 Muscle weakness (generalized): Secondary | ICD-10-CM | POA: Diagnosis not present

## 2017-06-27 DIAGNOSIS — R2689 Other abnormalities of gait and mobility: Secondary | ICD-10-CM | POA: Diagnosis not present

## 2017-06-27 DIAGNOSIS — M6281 Muscle weakness (generalized): Secondary | ICD-10-CM | POA: Diagnosis not present

## 2017-06-27 DIAGNOSIS — Z471 Aftercare following joint replacement surgery: Secondary | ICD-10-CM | POA: Diagnosis not present

## 2017-06-27 DIAGNOSIS — M25661 Stiffness of right knee, not elsewhere classified: Secondary | ICD-10-CM | POA: Diagnosis not present

## 2017-06-27 DIAGNOSIS — Z96651 Presence of right artificial knee joint: Secondary | ICD-10-CM | POA: Diagnosis not present

## 2017-06-29 DIAGNOSIS — R2689 Other abnormalities of gait and mobility: Secondary | ICD-10-CM | POA: Diagnosis not present

## 2017-06-29 DIAGNOSIS — Z471 Aftercare following joint replacement surgery: Secondary | ICD-10-CM | POA: Diagnosis not present

## 2017-06-29 DIAGNOSIS — Z96651 Presence of right artificial knee joint: Secondary | ICD-10-CM | POA: Diagnosis not present

## 2017-06-29 DIAGNOSIS — M6281 Muscle weakness (generalized): Secondary | ICD-10-CM | POA: Diagnosis not present

## 2017-06-29 DIAGNOSIS — M25661 Stiffness of right knee, not elsewhere classified: Secondary | ICD-10-CM | POA: Diagnosis not present

## 2017-07-10 DIAGNOSIS — M6281 Muscle weakness (generalized): Secondary | ICD-10-CM | POA: Diagnosis not present

## 2017-07-10 DIAGNOSIS — Z471 Aftercare following joint replacement surgery: Secondary | ICD-10-CM | POA: Diagnosis not present

## 2017-07-10 DIAGNOSIS — Z96651 Presence of right artificial knee joint: Secondary | ICD-10-CM | POA: Diagnosis not present

## 2017-07-10 DIAGNOSIS — R2689 Other abnormalities of gait and mobility: Secondary | ICD-10-CM | POA: Diagnosis not present

## 2017-07-10 DIAGNOSIS — M25661 Stiffness of right knee, not elsewhere classified: Secondary | ICD-10-CM | POA: Diagnosis not present

## 2017-07-13 DIAGNOSIS — Z961 Presence of intraocular lens: Secondary | ICD-10-CM | POA: Diagnosis not present

## 2017-07-13 DIAGNOSIS — H5442A3 Blindness left eye category 3, normal vision right eye: Secondary | ICD-10-CM | POA: Diagnosis not present

## 2017-07-13 DIAGNOSIS — H52201 Unspecified astigmatism, right eye: Secondary | ICD-10-CM | POA: Diagnosis not present

## 2017-07-13 DIAGNOSIS — H31002 Unspecified chorioretinal scars, left eye: Secondary | ICD-10-CM | POA: Diagnosis not present

## 2017-07-17 DIAGNOSIS — Z471 Aftercare following joint replacement surgery: Secondary | ICD-10-CM | POA: Diagnosis not present

## 2017-07-17 DIAGNOSIS — M25661 Stiffness of right knee, not elsewhere classified: Secondary | ICD-10-CM | POA: Diagnosis not present

## 2017-07-17 DIAGNOSIS — R2689 Other abnormalities of gait and mobility: Secondary | ICD-10-CM | POA: Diagnosis not present

## 2017-07-17 DIAGNOSIS — M6281 Muscle weakness (generalized): Secondary | ICD-10-CM | POA: Diagnosis not present

## 2017-07-17 DIAGNOSIS — Z96651 Presence of right artificial knee joint: Secondary | ICD-10-CM | POA: Diagnosis not present

## 2017-08-14 DIAGNOSIS — Z96651 Presence of right artificial knee joint: Secondary | ICD-10-CM | POA: Diagnosis not present

## 2017-08-14 DIAGNOSIS — Z471 Aftercare following joint replacement surgery: Secondary | ICD-10-CM | POA: Diagnosis not present

## 2017-09-19 DIAGNOSIS — C50911 Malignant neoplasm of unspecified site of right female breast: Secondary | ICD-10-CM | POA: Diagnosis not present

## 2017-10-02 IMAGING — CR DG HIP (WITH OR WITHOUT PELVIS) 2-3V*L*
3 series · 3 of 3 positions shown · non-contrast
Comparison: Postoperative radiographs dated December 02, 2010

CLINICAL DATA: Two months of left hip pain. Today she felt a pop
and now unable to bear weight.

EXAM:
DG HIP (WITH OR WITHOUT PELVIS) 2-3V LEFT

[x pelvis]
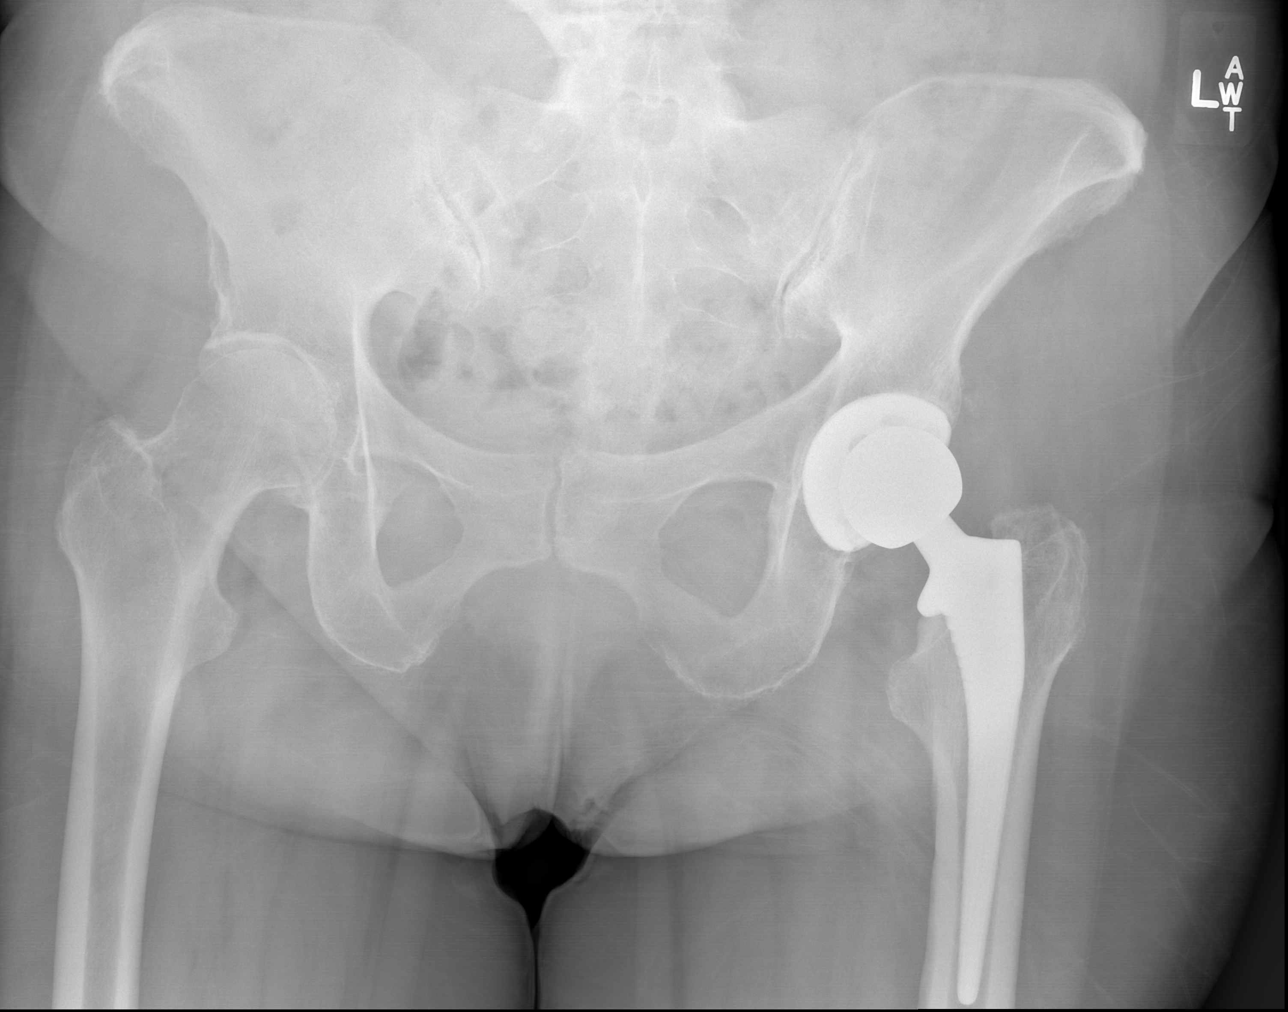

[x hip ap left]
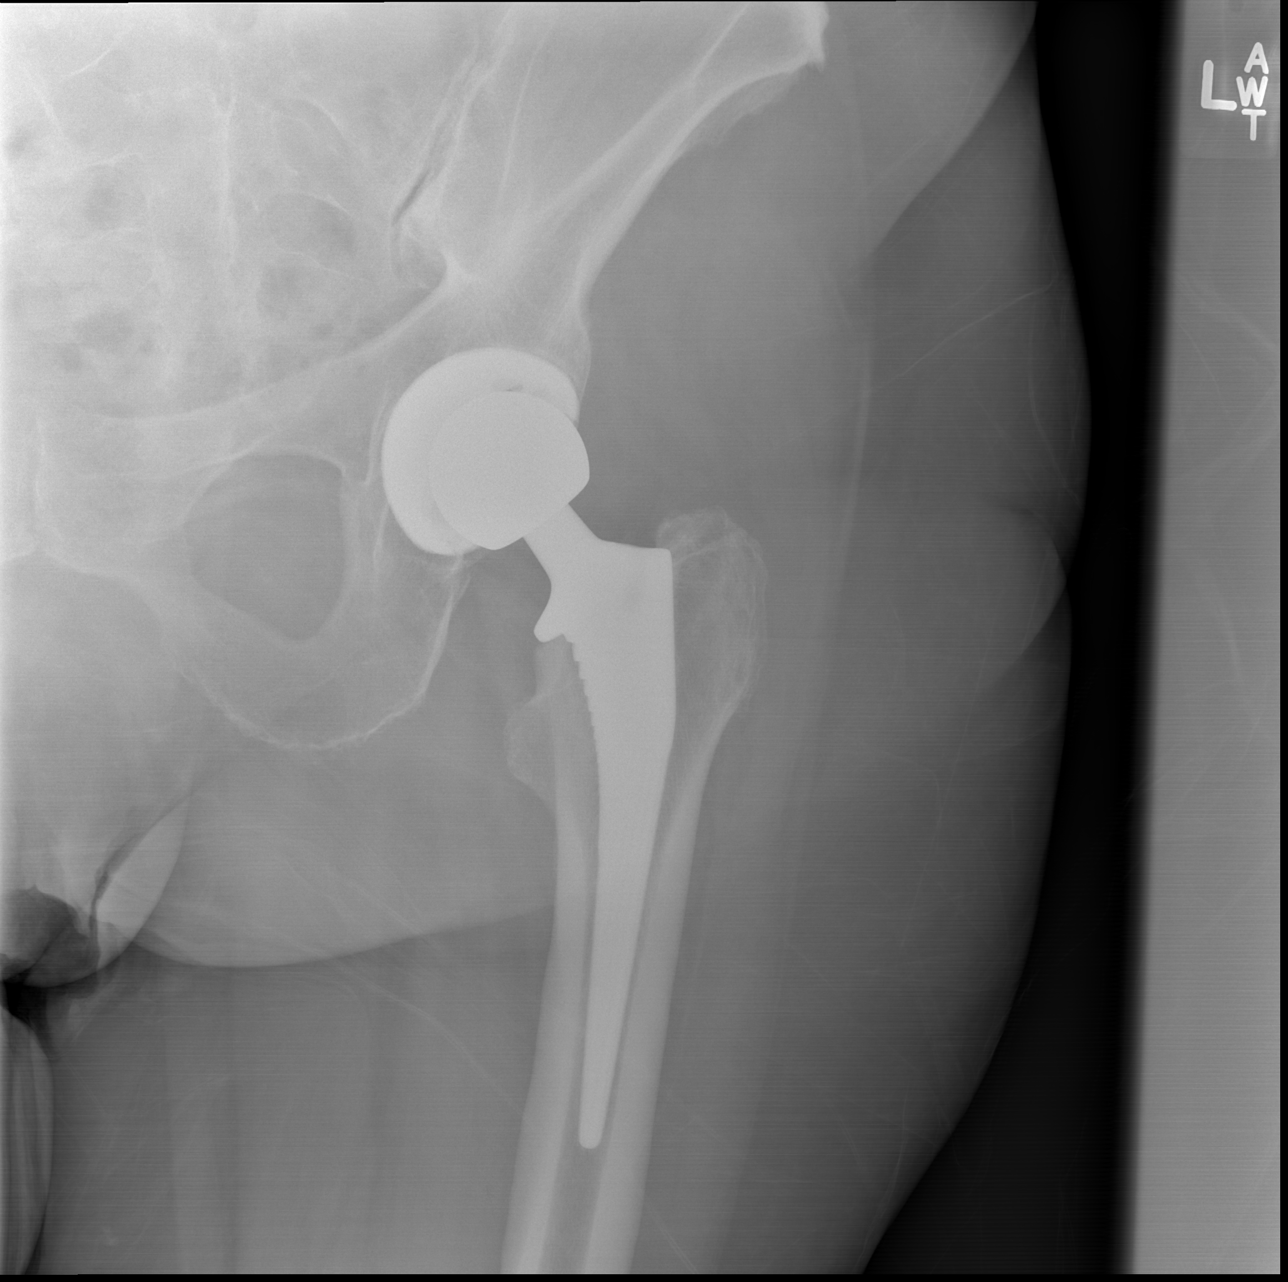

[x hip lat left]
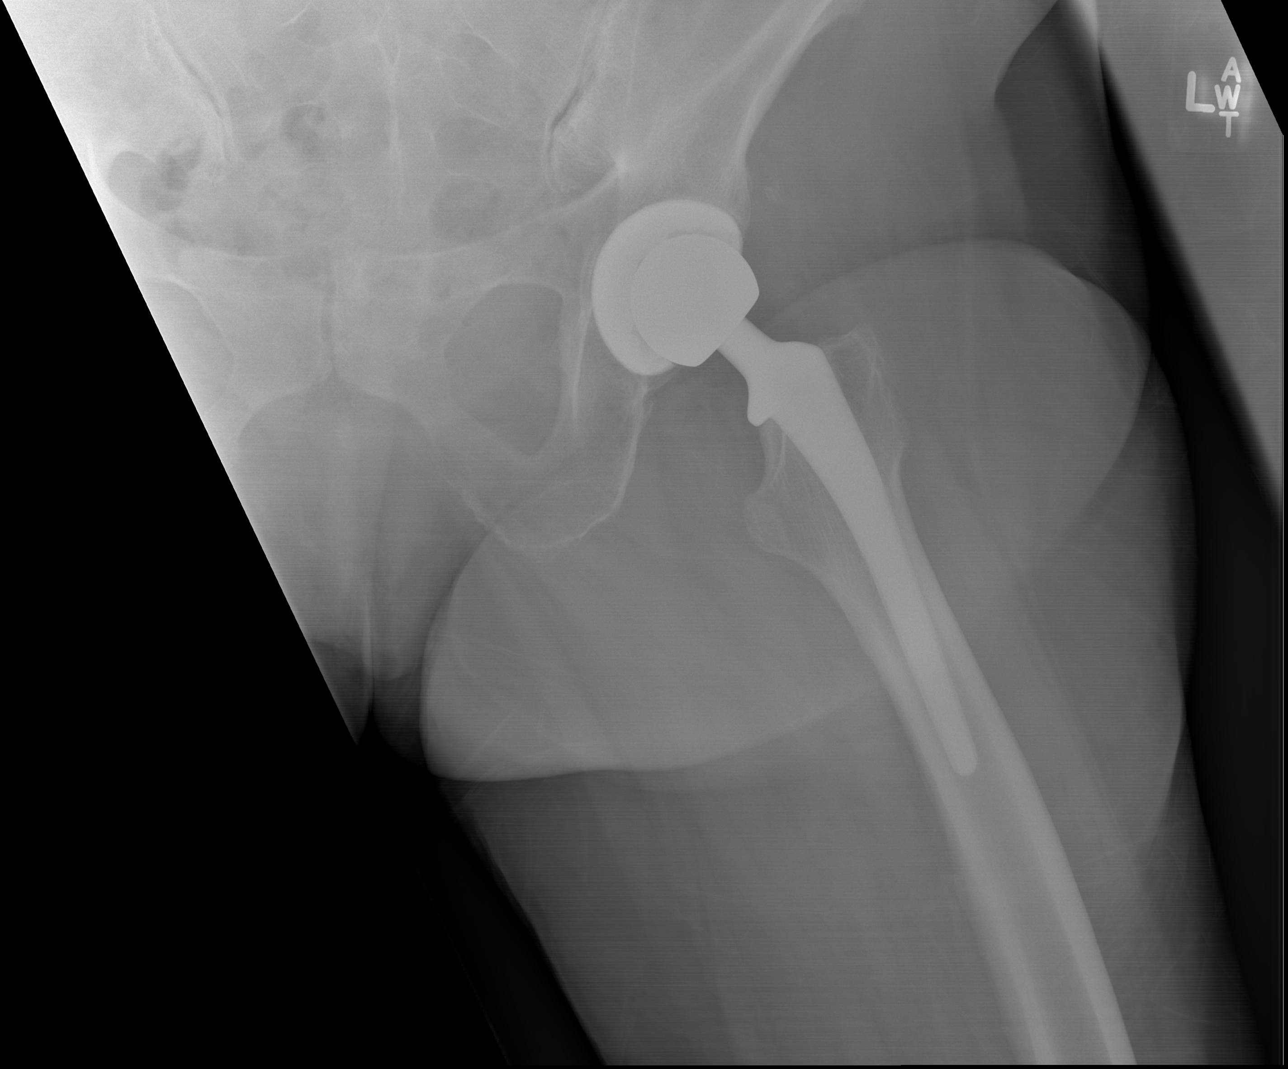

[3 of 3 positions shown; findings below may reference images not displayed]

FINDINGS: There is a prosthetic left hip joint in place. Radiographic
positioning of the prosthetic components appears good. There is no
evidence of dislocation. The interface with the native bone appears
normal. The observed portions of the bony pelvis exhibit no acute
abnormalities. There are degenerative changes of the right hip and
right SI joint.
IMPRESSION: No acute abnormality of the native bone or left hip prosthesis.

## 2017-10-29 DIAGNOSIS — I10 Essential (primary) hypertension: Secondary | ICD-10-CM | POA: Diagnosis not present

## 2017-10-29 DIAGNOSIS — E038 Other specified hypothyroidism: Secondary | ICD-10-CM | POA: Diagnosis not present

## 2017-11-01 DIAGNOSIS — R82998 Other abnormal findings in urine: Secondary | ICD-10-CM | POA: Diagnosis not present

## 2017-11-01 DIAGNOSIS — I10 Essential (primary) hypertension: Secondary | ICD-10-CM | POA: Diagnosis not present

## 2017-11-02 DIAGNOSIS — Z1212 Encounter for screening for malignant neoplasm of rectum: Secondary | ICD-10-CM | POA: Diagnosis not present

## 2017-11-05 DIAGNOSIS — Z23 Encounter for immunization: Secondary | ICD-10-CM | POA: Diagnosis not present

## 2017-11-05 DIAGNOSIS — Z6827 Body mass index (BMI) 27.0-27.9, adult: Secondary | ICD-10-CM | POA: Diagnosis not present

## 2017-11-05 DIAGNOSIS — R05 Cough: Secondary | ICD-10-CM | POA: Diagnosis not present

## 2017-11-05 DIAGNOSIS — M546 Pain in thoracic spine: Secondary | ICD-10-CM | POA: Diagnosis not present

## 2017-11-05 DIAGNOSIS — R2689 Other abnormalities of gait and mobility: Secondary | ICD-10-CM | POA: Diagnosis not present

## 2017-11-05 DIAGNOSIS — C50919 Malignant neoplasm of unspecified site of unspecified female breast: Secondary | ICD-10-CM | POA: Diagnosis not present

## 2017-11-05 DIAGNOSIS — Z0000 Encounter for general adult medical examination without abnormal findings: Secondary | ICD-10-CM | POA: Diagnosis not present

## 2017-11-05 DIAGNOSIS — E7849 Other hyperlipidemia: Secondary | ICD-10-CM | POA: Diagnosis not present

## 2017-11-05 DIAGNOSIS — Q762 Congenital spondylolisthesis: Secondary | ICD-10-CM | POA: Diagnosis not present

## 2017-11-05 DIAGNOSIS — E038 Other specified hypothyroidism: Secondary | ICD-10-CM | POA: Diagnosis not present

## 2017-11-05 DIAGNOSIS — I2510 Atherosclerotic heart disease of native coronary artery without angina pectoris: Secondary | ICD-10-CM | POA: Diagnosis not present

## 2017-11-05 DIAGNOSIS — I10 Essential (primary) hypertension: Secondary | ICD-10-CM | POA: Diagnosis not present

## 2017-11-05 DIAGNOSIS — M169 Osteoarthritis of hip, unspecified: Secondary | ICD-10-CM | POA: Diagnosis not present

## 2017-12-24 ENCOUNTER — Other Ambulatory Visit: Payer: Medicare Other

## 2017-12-24 ENCOUNTER — Ambulatory Visit: Payer: Medicare Other | Admitting: Oncology

## 2017-12-28 ENCOUNTER — Encounter: Payer: Self-pay | Admitting: Adult Health

## 2017-12-28 ENCOUNTER — Telehealth: Payer: Self-pay | Admitting: Adult Health

## 2017-12-28 ENCOUNTER — Inpatient Hospital Stay (HOSPITAL_BASED_OUTPATIENT_CLINIC_OR_DEPARTMENT_OTHER): Payer: Medicare Other | Admitting: Adult Health

## 2017-12-28 ENCOUNTER — Inpatient Hospital Stay: Payer: Medicare Other | Attending: Adult Health

## 2017-12-28 VITALS — BP 161/68 | HR 72 | Temp 98.4°F | Resp 16 | Ht 68.0 in | Wt 182.5 lb

## 2017-12-28 DIAGNOSIS — E669 Obesity, unspecified: Secondary | ICD-10-CM

## 2017-12-28 DIAGNOSIS — E039 Hypothyroidism, unspecified: Secondary | ICD-10-CM | POA: Insufficient documentation

## 2017-12-28 DIAGNOSIS — I10 Essential (primary) hypertension: Secondary | ICD-10-CM

## 2017-12-28 DIAGNOSIS — Z79811 Long term (current) use of aromatase inhibitors: Secondary | ICD-10-CM | POA: Insufficient documentation

## 2017-12-28 DIAGNOSIS — I200 Unstable angina: Secondary | ICD-10-CM | POA: Insufficient documentation

## 2017-12-28 DIAGNOSIS — M8588 Other specified disorders of bone density and structure, other site: Secondary | ICD-10-CM | POA: Insufficient documentation

## 2017-12-28 DIAGNOSIS — C50411 Malignant neoplasm of upper-outer quadrant of right female breast: Secondary | ICD-10-CM

## 2017-12-28 DIAGNOSIS — Z79899 Other long term (current) drug therapy: Secondary | ICD-10-CM | POA: Diagnosis not present

## 2017-12-28 DIAGNOSIS — I252 Old myocardial infarction: Secondary | ICD-10-CM | POA: Diagnosis not present

## 2017-12-28 DIAGNOSIS — Z170 Estrogen receptor positive status [ER+]: Secondary | ICD-10-CM

## 2017-12-28 DIAGNOSIS — E2839 Other primary ovarian failure: Secondary | ICD-10-CM | POA: Diagnosis not present

## 2017-12-28 LAB — COMPREHENSIVE METABOLIC PANEL
ALT: 11 U/L (ref 0–44)
AST: 15 U/L (ref 15–41)
Albumin: 3.9 g/dL (ref 3.5–5.0)
Alkaline Phosphatase: 114 U/L (ref 38–126)
Anion gap: 9 (ref 5–15)
BUN: 18 mg/dL (ref 8–23)
CO2: 28 mmol/L (ref 22–32)
Calcium: 9.5 mg/dL (ref 8.9–10.3)
Chloride: 104 mmol/L (ref 98–111)
Creatinine, Ser: 0.8 mg/dL (ref 0.44–1.00)
Glucose, Bld: 80 mg/dL (ref 70–99)
POTASSIUM: 4 mmol/L (ref 3.5–5.1)
SODIUM: 141 mmol/L (ref 135–145)
Total Bilirubin: 0.7 mg/dL (ref 0.3–1.2)
Total Protein: 7.1 g/dL (ref 6.5–8.1)

## 2017-12-28 LAB — CBC WITH DIFFERENTIAL/PLATELET
Abs Immature Granulocytes: 0.03 10*3/uL (ref 0.00–0.07)
BASOS PCT: 0 %
Basophils Absolute: 0 10*3/uL (ref 0.0–0.1)
EOS ABS: 0.3 10*3/uL (ref 0.0–0.5)
Eosinophils Relative: 3 %
HCT: 39.3 % (ref 36.0–46.0)
Hemoglobin: 12.3 g/dL (ref 12.0–15.0)
IMMATURE GRANULOCYTES: 0 %
Lymphocytes Relative: 23 %
Lymphs Abs: 2 10*3/uL (ref 0.7–4.0)
MCH: 28 pg (ref 26.0–34.0)
MCHC: 31.3 g/dL (ref 30.0–36.0)
MCV: 89.3 fL (ref 80.0–100.0)
MONOS PCT: 11 %
Monocytes Absolute: 0.9 10*3/uL (ref 0.1–1.0)
NEUTROS ABS: 5.3 10*3/uL (ref 1.7–7.7)
NEUTROS PCT: 63 %
PLATELETS: 181 10*3/uL (ref 150–400)
RBC: 4.4 MIL/uL (ref 3.87–5.11)
RDW: 14.4 % (ref 11.5–15.5)
WBC: 8.5 10*3/uL (ref 4.0–10.5)
nRBC: 0 % (ref 0.0–0.2)

## 2017-12-28 NOTE — Progress Notes (Signed)
[place]  [phone] [phone]) [phone]     ID: Kathy Murphy DOB: February 23, 1979  MR#: [id number]  OXB#:[id number]  Patient Care Team: Burnard Bunting, MD as PCP - General (Internal Medicine) Magrinat, Virgie Dad, MD as Consulting Physician (Oncology) Rolm Bookbinder, MD as Consulting Physician (General Surgery) Croitoru, Dani Gobble, MD as Consulting Physician (Cardiology) Netta Cedars, MD as Consulting Physician (Orthopedic Surgery) OTHER MD:  CHIEF COMPLAINT: Estrogen receptor positive invasive lobular breast cancer  CURRENT TREATMENT: Anastrozole   BREAST CANCER HISTORY: From the original intake note:  [name] had a change in her right breast and was referred to [facility] where on 04/14/2016 bilateral diagnostic mammography with tomography and right breast ultrasound was obtained. The breast density was category B. In the central right breast there was an oval mass with no other findings of concern. Ultrasound located a benign 0.3 cm cyst in the upper inner quadrant of the right breast correlating with the mammography findings. Routine mammography was recommended for one year.  However with further changes in the right breast note by the patient and her primary care MD, repeat right diagnostic mammography with tomography and repeat right breast ultrasonography 08/21/2016 now found a 3 cm area of asymmetry in the upper outer right breast which on physical exam measured approximately 2-1/2 cm at the 10:00 location 8 cm from the nipple. Ultrasound of this area showed a 0.5 cm hypoechoic mass with a larger 3 cm area of hazy isoechoic tissue with abnormal architecture. The right axilla showed 2 normal-appearing lymph nodes.  Biopsy of the right breast upper outer quadrant mass 08/28/2016 showed ([id number]) an invasive lobular carcinoma, E-cadherin negative, estrogen receptor 85% positive, progesterone receptor 100% positive, both with strong staining intensity, with an  MIB-1 of 5%, and no HER-2 implication, the signals ratio being 1.49 and the number per cell 3.21.  The patient's subsequent history is as detailed below.  INTERVAL HISTORY: [name] returns today for follow-up and treatment of her estrogen receptor positive breast cancer accompanied by a friend, [name].  She continues on anastrozole, with good tolerance.  She does have some hot flashes, and manages/tolerates these well.    REVIEW OF SYSTEMS: [name] has some mild leg swelling since she returned from a trip to the [place] with church.  She notes she did a lot of walking during this trip.  She notes she has some arthritis in her fingers. She sees [name] who is her PCP and she sees him regularly.  She doesn't regularly exercise, but she is active.  She doesn't think that she is eligible for silver sneakers.  She does not have regular skin exams.    Other than some skin concerns she wants me to look at, she denies any new issues such as fevers, chills, nausea, vomiting, constipation, diarrhea, headaches, vision issues, bowel/bladder concerns.  A detailed ROS was otherwise non contributory.       PAST MEDICAL HISTORY: Past Medical History:  Diagnosis Date  . Arthritis   . Breast cancer ([place])    Right  . Cancer ([place])    thyroid  . GERD (gastroesophageal reflux disease)   . GI bleed 1980   AFTER POLYP EXCISION  . Heart disease, hypertensive   . Hypercholesterolemia   . Hypertension   . Hypothyroidism   . Incontinence of urine   . Myalgia   . Myocardial infarction ([place])   . Obesity   . Pain in joints   . Retinal detachment   . Unsteady  gait   . UTI (urinary tract infection)   . Vision loss of left eye   . Vomiting     PAST SURGICAL HISTORY: Past Surgical History:  Procedure Laterality Date  . ABDOMINAL HYSTERECTOMY  1977  . BREAST BIOPSY    . BREAST LUMPECTOMY     x5  . CARDIAC CATHETERIZATION N/A 02/08/2016   Procedure: Left Heart Cath and Coronary Angiography;   Surgeon: Belva Crome, MD;  Location: [facility];  Service: Cardiovascular;  Laterality: N/A;  . CARDIAC CATHETERIZATION N/A 02/11/2016   Procedure: Coronary Stent Intervention;  Surgeon: Belva Crome, MD;  Location: [facility];  Service: Cardiovascular;  Laterality: N/A;  . CATARACT EXTRACTION, BILATERAL    . CERVICAL LAMINECTOMY    . [place]  . EVACUATION BREAST HEMATOMA Right 01/09/2017   Procedure: EVACUATION RIGHT MASTECTOMY FLUID COLLECTION;  Surgeon: Rolm Bookbinder, MD;  Location: [facility];  Service: General;  Laterality: Right;  . FOOT SURGERY    . NECK SURGERY    . THYROIDECTOMY  1957  . TOTAL HIP ARTHROPLASTY  2010/04/27   left  . TOTAL KNEE ARTHROPLASTY Right 05/18/2017   Procedure: RIGHT TOTAL KNEE ARTHROPLASTY;  Surgeon: Netta Cedars, MD;  Location: [facility];  Service: Orthopedics;  Laterality: Right;  . TOTAL MASTECTOMY Right 11/27/2016  . TOTAL MASTECTOMY Right 11/27/2016   Procedure: RIGHT TOTAL MASTECTOMY;  Surgeon: Rolm Bookbinder, MD;  Location: [facility];  Service: General;  Laterality: Right;    FAMILY HISTORY Family History  Problem Relation Age of Onset  . Heart disease Mother   The patient has little information regarding her father. Her mother died at age 29 in a nursing home from what the patient thinks may have been heart disease. The patient had no brothers and no sisters.  GYNECOLOGIC HISTORY:  No LMP recorded. Patient is postmenopausal. She thinks her first menstrual period may have been age 84. She never carried a child to term. She underwent hysterectomy with bilateral salpingo-oophorectomy in her 84s. She was on estrogen replacement until July 2018  SOCIAL HISTORY:  She is a retired Glass blower/designer for [employer]. Her husband died in 04/27/12. She lives by herself, with no pets, in a fairly large house on [place] acres she says. She does much of the house work herself. She also pays all her bills and keeps all her  accounts.    ADVANCED DIRECTIVES: In place. She has named her close friend [name] as her healthcare power of attorney. He can be reached at (cell) [phone] or (home) [phone]   HEALTH MAINTENANCE: Social History   Tobacco Use  . Smoking status: Never Smoker  . Smokeless tobacco: Never Used  Substance Use Topics  . Alcohol use: No  . Drug use: No     Colonoscopy: November 2011/[facility]  PAP: 04/27/2014  Bone density: 10/04/2016 at [facility] showed a T score of -1.4 osteopenic   Allergies  Allergen Reactions  . Adhesive [Tape] Other (See Comments)  . Other Other (See Comments)    SOME OF THE "MYCINS"...UNKNOWN REACTION.  Marland Kitchen Latex Rash    Current Outpatient Medications  Medication Sig Dispense Refill  . acetaminophen (TYLENOL) 650 MG CR tablet Take 1,300 mg by mouth every 8 (eight) hours as needed for pain.    Marland Kitchen anastrozole (ARIMIDEX) 1 MG tablet Take 1 tablet (1 mg total) by mouth daily. 90 tablet 4  . aspirin 81 MG chewable tablet Chew 1 tablet (81 mg total)  by mouth 2 (two) times daily. 60 tablet 0  . Carboxymethylcellulose Sodium (REFRESH TEARS OP) Apply 1 drop to eye daily.    . diazepam (VALIUM) 2 MG tablet Take 2 mg by mouth every 6 (six) hours as needed for anxiety. 1/2 tab BID prn    . Diclofenac Sodium CR 100 MG 24 hr tablet Take 100 mg by mouth daily.    Marland Kitchen levothyroxine (SYNTHROID, LEVOTHROID) 125 MCG tablet Take 125 mcg by mouth daily before breakfast.    . losartan (COZAAR) 100 MG tablet Take 100 mg by mouth daily.     . metoprolol succinate (TOPROL-XL) 100 MG 24 hr tablet TAKE 1 TABLET DAILY. TAKE WITH OR IMMEDIATELY FOLLOWING A MEAL 90 tablet 3  . pantoprazole (PROTONIX) 40 MG tablet Take 40 mg by mouth daily.      . rosuvastatin (CRESTOR) 10 MG tablet Take 10 mg by mouth every morning.    . hydrochlorothiazide (MICROZIDE) 12.5 MG capsule Take 1 capsule (12.5 mg total) by mouth 3 (three) times a week. 45 capsule 3  . oxyCODONE (ROXICODONE) 5 MG immediate release  tablet Take 1 tablet (5 mg total) by mouth every 4 (four) hours as needed for severe pain. (Patient not taking: Reported on 12/28/2017) 30 tablet 0   No current facility-administered medications for this visit.     OBJECTIVE:   Vitals:   12/28/17 1216  BP: (!) 161/68  Pulse: 72  Resp: 16  Temp: 98.4 F (36.9 C)  SpO2: 98%     Body mass index is 27.75 kg/m.   Wt Readings from Last 3 Encounters:  12/28/17 182 lb 8 oz (82.8 kg)  05/18/17 183 lb (83 kg)  05/08/17 183 lb (83 kg)      ECOG FS:2 - Symptomatic, <50% confined to bed GENERAL: Patient is a well appearing female in no acute distress HEENT:  Sclerae anicteric.  Oropharynx clear and moist. No ulcerations or evidence of oropharyngeal candidiasis. Neck is supple.  NODES:  No cervical, supraclavicular, or axillary lymphadenopathy palpated.  BREAST EXAM: right breast s/p mastectomy, no nodules or masses, left breast s/p lumpectomy, benign LUNGS:  Clear to auscultation bilaterally.  No wheezes or rhonchi. HEART:  Regular rate and rhythm. No murmur appreciated. ABDOMEN:  Soft, nontender.  Positive, normoactive bowel sounds. No organomegaly palpated. MSK:  No focal spinal tenderness to palpation. Full range of motion bilaterally in the upper extremities. EXTREMITIES:  No peripheral edema.   SKIN:  Several abnormal skin lesions, including an abnormal large nevi on her left upper back, and an erythematous asymmetric abnormal erythematous lesion with some scabbing in the center. No nail dyscrasia. NEURO:  Nonfocal. Well oriented.  Appropriate affect.   LAB RESULTS:  CMP     Component Value Date/Time   NA 138 05/19/2017 0348   NA 140 01/22/2017 0946   K 3.5 05/19/2017 0348   K 4.1 01/22/2017 0946   CL 103 05/19/2017 0348   CO2 26 05/19/2017 0348   CO2 29 01/22/2017 0946   GLUCOSE 130 (H) 05/19/2017 0348   GLUCOSE 101 01/22/2017 0946   BUN 17 05/19/2017 0348   BUN 17.9 01/22/2017 0946   CREATININE 0.84 05/19/2017 0348    CREATININE 0.8 01/22/2017 0946   CALCIUM 8.6 (L) 05/19/2017 0348   CALCIUM 9.1 01/22/2017 0946   PROT 6.6 04/09/2017 1427   PROT 6.5 01/22/2017 0946   ALBUMIN 3.7 04/09/2017 1427   ALBUMIN 3.1 (L) 01/22/2017 0946   AST 11 04/09/2017 1427  AST 10 01/22/2017 0946   ALT 6 04/09/2017 1427   ALT 12 01/22/2017 0946   ALKPHOS 93 04/09/2017 1427   ALKPHOS 92 01/22/2017 0946   BILITOT 0.4 04/09/2017 1427   BILITOT 0.37 01/22/2017 0946   GFRNONAA >60 05/19/2017 0348   GFRAA >60 05/19/2017 0348    No results found for: Ronnald Ramp, A1GS, A2GS, BETS, BETA2SER, GAMS, MSPIKE, SPEI  No results found for: Nils Pyle, [facility]  Lab Results  Component Value Date   WBC 8.5 12/28/2017   NEUTROABS 5.3 12/28/2017   HGB 12.3 12/28/2017   HCT 39.3 12/28/2017   MCV 89.3 12/28/2017   PLT 181 12/28/2017      Chemistry      Component Value Date/Time   NA 138 05/19/2017 0348   NA 140 01/22/2017 0946   K 3.5 05/19/2017 0348   K 4.1 01/22/2017 0946   CL 103 05/19/2017 0348   CO2 26 05/19/2017 0348   CO2 29 01/22/2017 0946   BUN 17 05/19/2017 0348   BUN 17.9 01/22/2017 0946   CREATININE 0.84 05/19/2017 0348   CREATININE 0.8 01/22/2017 0946      Component Value Date/Time   CALCIUM 8.6 (L) 05/19/2017 0348   CALCIUM 9.1 01/22/2017 0946   ALKPHOS 93 04/09/2017 1427   ALKPHOS 92 01/22/2017 0946   AST 11 04/09/2017 1427   AST 10 01/22/2017 0946   ALT 6 04/09/2017 1427   ALT 12 01/22/2017 0946   BILITOT 0.4 04/09/2017 1427   BILITOT 0.37 01/22/2017 0946       No results found for: LABCA2  No components found for: [id number]  No results for input(s): INR in the last 168 hours.  Urinalysis    Component Value Date/Time   COLORURINE YELLOW 12/26/2012 0843   APPEARANCEUR CLOUDY (A) 12/26/2012 0843   LABSPEC 1.019 12/26/2012 0843   PHURINE 6.0 12/26/2012 0843   GLUCOSEU NEGATIVE 12/26/2012 0843   HGBUR SMALL (A) 12/26/2012 0843   BILIRUBINUR NEGATIVE  12/26/2012 0843   KETONESUR NEGATIVE 12/26/2012 0843   PROTEINUR NEGATIVE 12/26/2012 0843   UROBILINOGEN 0.2 12/26/2012 0843   NITRITE NEGATIVE 12/26/2012 0843   LEUKOCYTESUR NEGATIVE 12/26/2012 0843     STUDIES: mammogram in 04/2017 from [facility], records not available, but requested  ELIGIBLE FOR AVAILABLE RESEARCH PROTOCOL: No  ASSESSMENT: 84 y.o. [place], [place] woman status post right breast upper outer quadrant biopsy 08/28/2016 for a clinical T2 N0, stage IB invasive lobular carcinoma, grade not stated, estrogen and progesterone strongly positive, HER-2 nonamplified, with an MIB-1 of 5%.  (1) anastrozole started 09/25/2016  (a) bone density on 10/04/2016 at [facility] showed a T score of -1.4 osteopenic  (2) Status post right mastectomy without sentinel lymph node sampling 11/27/2016 for a pT2 cN0, stage IB invasive lobular carcinoma, grade 1, with negative margins.  (3) no adjuvant radiation indicated  PLAN:  Brylei is doing well today and is without any clinical or radiographic sign of recurrence.  She is tolerating anastrozole well and will continue this.  I believe her left breast mammogram is due in 04/2018 at [facility], however do not know for sure, because I have not personally seen the results.  My nurse requested a copy of her mammogram from [facility] today.  Keayra has several abnormal areas on her skin.  These have not been evaluated by her dermatologist.  I recommended she see her dermatologist as soon as possible to evaluate these.    Kaycie has h/o osteopenia.  I ordered her next bone  density which is due in 09/2018.  I also gave her detailed information in her AVS about calcium, vitamin d and weight bearing exercises.  Nasrin was recommended to exercise, and continue with her healthy diet and regular f/u with her PCP.  She will see Dr. Donne Hazel in 6 months and Dr. Jana Hakim in one year.  She knows to call for any problems that may develop before that visit.  A total of (30) minutes of  face-to-face time was spent with this patient with greater than 50% of that time in counseling and care-coordination.   Wilber Bihari, NP 12/28/17 12:29 PM  Medical Oncology and Hematology [facility] [address] [place], [place] [address] Tel. [phone] Fax. [phone]

## 2017-12-28 NOTE — Telephone Encounter (Signed)
Gave patient avs and calendar.   °

## 2017-12-28 NOTE — Patient Instructions (Signed)
Bone Health Bones protect organs, store calcium, and anchor muscles. Good health habits, such as eating nutritious foods and exercising regularly, are important for maintaining healthy bones. They can also help to prevent a condition that causes bones to lose density and become weak and brittle (osteoporosis). Why is bone mass important? Bone mass refers to the amount of bone tissue that you have. The higher your bone mass, the stronger your bones. An important step toward having healthy bones throughout life is to have strong and dense bones during childhood. A young adult who has a high bone mass is more likely to have a high bone mass later in life. Bone mass at its greatest it is called peak bone mass. A large decline in bone mass occurs in older adults. In women, it occurs about the time of menopause. During this time, it is important to practice good health habits, because if more bone is lost than what is replaced, the bones will become less healthy and more likely to break (fracture). If you find that you have a low bone mass, you may be able to prevent osteoporosis or further bone loss by changing your diet and lifestyle. How can I find out if my bone mass is low? Bone mass can be measured with an X-ray test that is called a bone mineral density (BMD) test. This test is recommended for all women who are age 65 or older. It may also be recommended for men who are age 70 or older, or for people who are more likely to develop osteoporosis due to:  Having bones that break easily.  Having a long-term disease that weakens bones, such as kidney disease or rheumatoid arthritis.  Having menopause earlier than normal.  Taking medicine that weakens bones, such as steroids, thyroid hormones, or hormone treatment for breast cancer or prostate cancer.  Smoking.  Drinking three or more alcoholic drinks each day.  What are the nutritional recommendations for healthy bones? To have healthy bones, you  need to get enough of the right minerals and vitamins. Most nutrition experts recommend getting these nutrients from the foods that you eat. Nutritional recommendations vary from person to person. Ask your health care provider what is healthy for you. Here are some general guidelines. Calcium Recommendations Calcium is the most important (essential) mineral for bone health. Most people can get enough calcium from their diet, but supplements may be recommended for people who are at risk for osteoporosis. Good sources of calcium include:  Dairy products, such as low-fat or nonfat milk, cheese, and yogurt.  Dark green leafy vegetables, such as bok choy and broccoli.  Calcium-fortified foods, such as orange juice, cereal, bread, soy beverages, and tofu products.  Nuts, such as almonds.  Follow these recommended amounts for daily calcium intake:  Children, age 1?3: 700 mg.  Children, age 4?8: 1,000 mg.  Children, age 9?13: 1,300 mg.  Teens, age 14?18: 1,300 mg.  Adults, age 19?50: 1,000 mg.  Adults, age 51?70: ? Men: 1,000 mg. ? Women: 1,200 mg.  Adults, age 71 or older: 1,200 mg.  Pregnant and breastfeeding females: ? Teens: 1,300 mg. ? Adults: 1,000 mg.  Vitamin D Recommendations Vitamin D is the most essential vitamin for bone health. It helps the body to absorb calcium. Sunlight stimulates the skin to make vitamin D, so be sure to get enough sunlight. If you live in a cold climate or you do not get outside often, your health care provider may recommend that you take vitamin   D supplements. Good sources of vitamin D in your diet include:  Egg yolks.  Saltwater fish.  Milk and cereal fortified with vitamin D.  Follow these recommended amounts for daily vitamin D intake:  Children and teens, age 1?18: 600 international units.  Adults, age 50 or younger: 400-800 international units.  Adults, age 51 or older: 800-1,000 international units.  Other Nutrients Other nutrients  for bone health include:  Phosphorus. This mineral is found in meat, poultry, dairy foods, nuts, and legumes. The recommended daily intake for adult men and adult women is 700 mg.  Magnesium. This mineral is found in seeds, nuts, dark green vegetables, and legumes. The recommended daily intake for adult men is 400?420 mg. For adult women, it is 310?320 mg.  Vitamin K. This vitamin is found in green leafy vegetables. The recommended daily intake is 120 mg for adult men and 90 mg for adult women.  What type of physical activity is best for building and maintaining healthy bones? Weight-bearing and strength-building activities are important for building and maintaining peak bone mass. Weight-bearing activities cause muscles and bones to work against gravity. Strength-building activities increases muscle strength that supports bones. Weight-bearing and muscle-building activities include:  Walking and hiking.  Jogging and running.  Dancing.  Gym exercises.  Lifting weights.  Tennis and racquetball.  Climbing stairs.  Aerobics.  Adults should get at least 30 minutes of moderate physical activity on most days. Children should get at least 60 minutes of moderate physical activity on most days. Ask your health care provide what type of exercise is best for you. Where can I find more information? For more information, check out the following websites:  National Osteoporosis Foundation: http://nof.org/learn/basics  National Institutes of Health: http://www.niams.nih.gov/Health_Info/Bone/Bone_Health/bone_health_for_life.asp  This information is not intended to replace advice given to you by your health care provider. Make sure you discuss any questions you have with your health care provider. Document Released: 05/20/2003 Document Revised: 09/17/2015 Document Reviewed: 03/04/2014 Elsevier Interactive Patient Education  2018 Elsevier Inc.  

## 2018-01-08 DIAGNOSIS — D485 Neoplasm of uncertain behavior of skin: Secondary | ICD-10-CM | POA: Diagnosis not present

## 2018-01-08 DIAGNOSIS — D1801 Hemangioma of skin and subcutaneous tissue: Secondary | ICD-10-CM | POA: Diagnosis not present

## 2018-01-08 DIAGNOSIS — C44519 Basal cell carcinoma of skin of other part of trunk: Secondary | ICD-10-CM | POA: Diagnosis not present

## 2018-01-08 DIAGNOSIS — L245 Irritant contact dermatitis due to other chemical products: Secondary | ICD-10-CM | POA: Diagnosis not present

## 2018-01-08 DIAGNOSIS — D0359 Melanoma in situ of other part of trunk: Secondary | ICD-10-CM | POA: Diagnosis not present

## 2018-01-08 DIAGNOSIS — C44612 Basal cell carcinoma of skin of right upper limb, including shoulder: Secondary | ICD-10-CM | POA: Diagnosis not present

## 2018-01-08 DIAGNOSIS — L821 Other seborrheic keratosis: Secondary | ICD-10-CM | POA: Diagnosis not present

## 2018-01-16 ENCOUNTER — Encounter: Payer: Self-pay | Admitting: Adult Health

## 2018-01-16 DIAGNOSIS — Z853 Personal history of malignant neoplasm of breast: Secondary | ICD-10-CM | POA: Diagnosis not present

## 2018-01-16 DIAGNOSIS — M8589 Other specified disorders of bone density and structure, multiple sites: Secondary | ICD-10-CM | POA: Diagnosis not present

## 2018-01-17 ENCOUNTER — Other Ambulatory Visit: Payer: Self-pay | Admitting: Adult Health

## 2018-01-17 ENCOUNTER — Telehealth: Payer: Self-pay

## 2018-01-17 NOTE — Telephone Encounter (Signed)
Spoke with patient informing of slightly lower BD.  NP recommends calcium, vitamin d and weight bearing exercise.  Pt asked for t-score and number given.  She voiced understanding and had no questions at this time.

## 2018-01-18 DIAGNOSIS — L7682 Other postprocedural complications of skin and subcutaneous tissue: Secondary | ICD-10-CM | POA: Diagnosis not present

## 2018-01-18 DIAGNOSIS — Z4802 Encounter for removal of sutures: Secondary | ICD-10-CM | POA: Diagnosis not present

## 2018-01-18 DIAGNOSIS — D0359 Melanoma in situ of other part of trunk: Secondary | ICD-10-CM | POA: Diagnosis not present

## 2018-02-16 ENCOUNTER — Other Ambulatory Visit: Payer: Self-pay | Admitting: Oncology

## 2018-03-18 IMAGING — MR MR BILATERAL BREAST WITHOUT AND WITH CONTRAST
8 of 12 series · 31 of 48 positions shown · IV contrast (17 ml Multihance)
Comparison: Previous exam(s).

CLINICAL DATA: Status post sonographic biopsy of a palpable
abnormality in the upper outer right breast, which revealed invasive
mammary carcinoma. Patient has a history of previous benign
bilateral breast excisions.

LABS:  Labs not drawn at time of imaging.  GFR 58.
EXAM:
BILATERAL BREAST MRI WITH AND WITHOUT CONTRAST
TECHNIQUE: Multiplanar, multisequence MR images of both breasts were obtained
prior to and following the intravenous administration of 17 ml of
MultiHance.

[Series 2: t2_tirm_tra ipat (a-p) · axial · 3.0mm · 0.70mm/px · 1 of 55 slices shown]
[im 1/55]
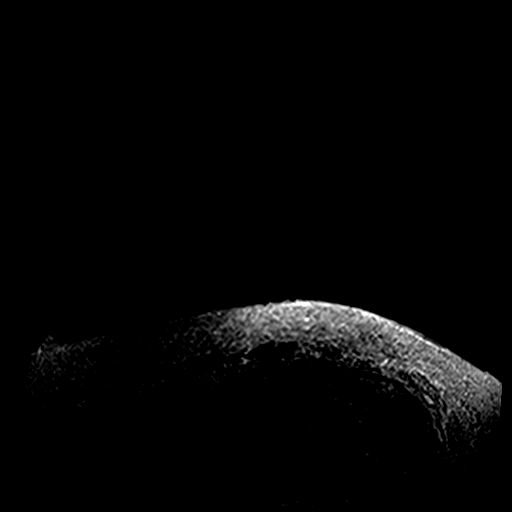

[Series 3: fl3d pre-cm no · axial · non-contrast · 1.2mm · 0.94mm/px · z∈[-73,+99]mm · 5 of 144 slices shown]
[im 1/144]
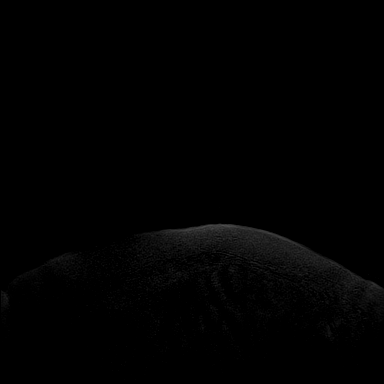
[im 36/144]
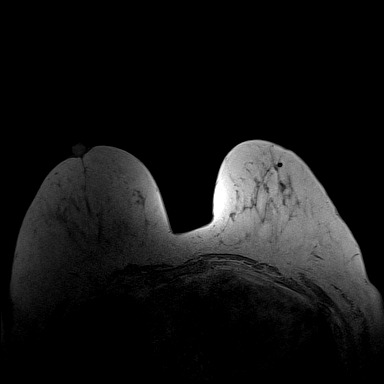
[im 72/144]
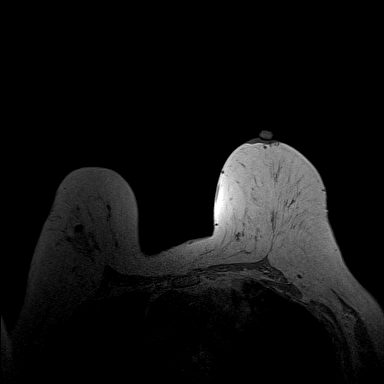
[im 108/144]
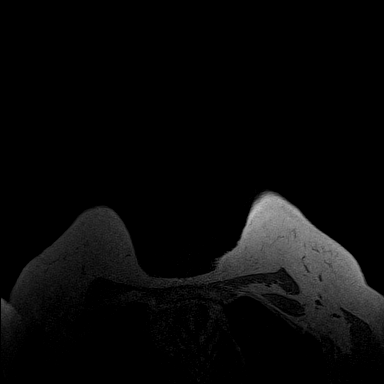
[im 144/144]
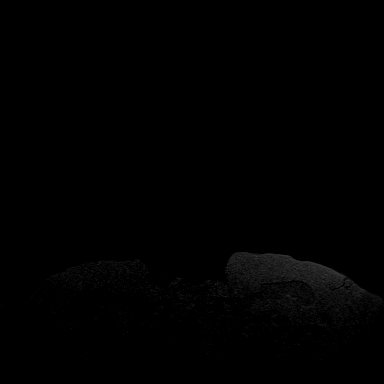

[Series 4: fl3d pre-cm · axial · non-contrast · 1.2mm · 0.94mm/px · z∈[-73,+99]mm · 5 of 144 slices shown]
[im 1/144]
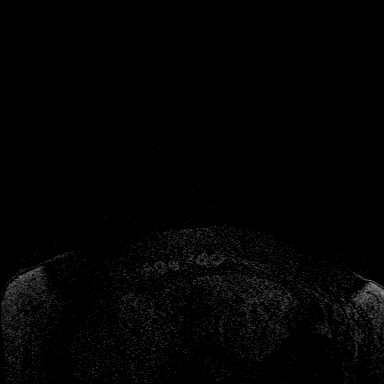
[im 36/144]
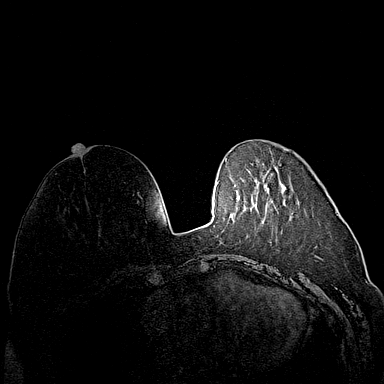
[im 72/144]
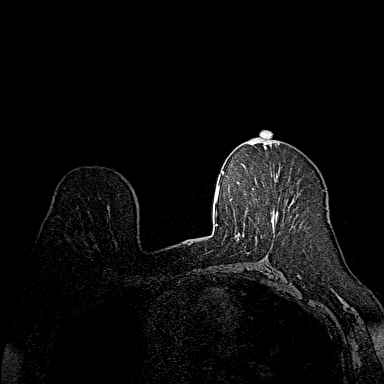
[im 108/144]
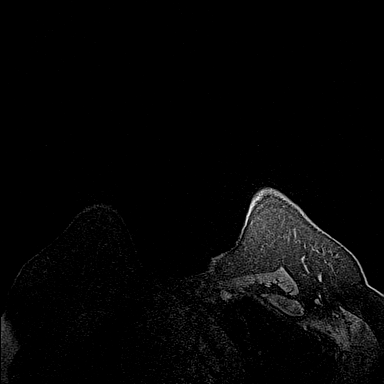
[im 144/144]
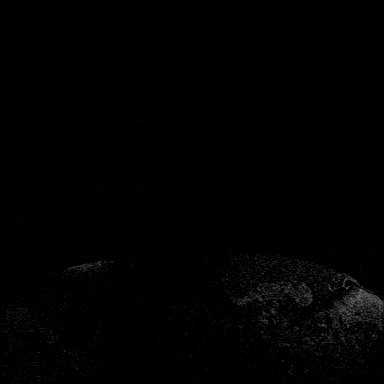

[Series 5: fl3d post-cm 20 · axial · 1.2mm · 0.94mm/px · z∈[-73,+99]mm · 5 of 144 slices shown (1 of 3)]
[im 1/144]
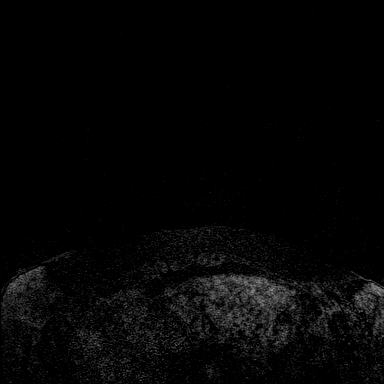
[im 36/144]
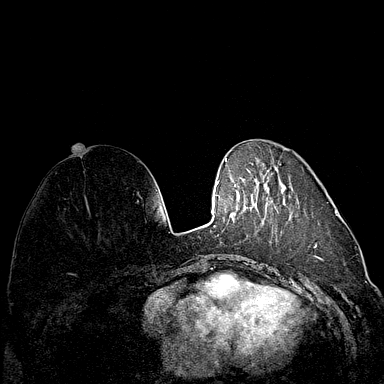
[im 72/144]
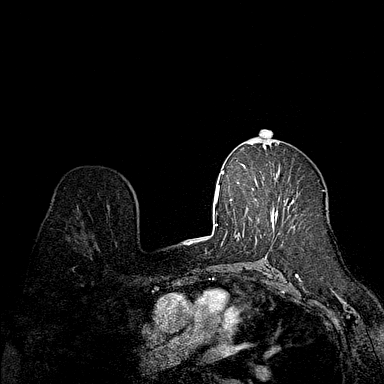
[im 108/144]
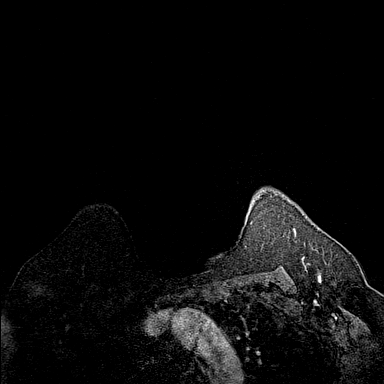
[im 144/144]
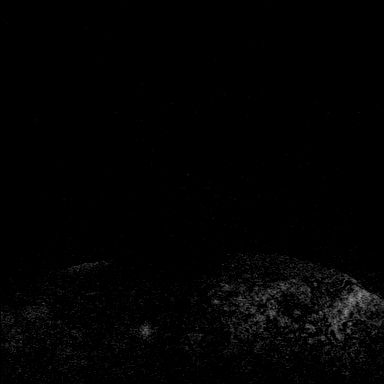

[Series 6: fl3d post-cm 20 · axial · 1.2mm · 0.94mm/px · z∈[-73,+99]mm · 5 of 144 slices shown (2 of 3)]
[im 1/144]
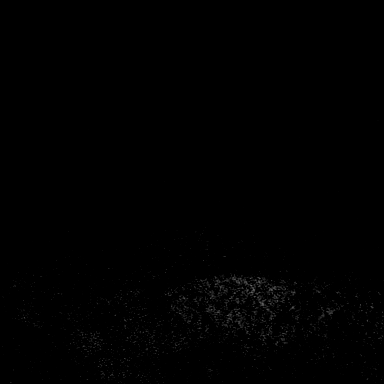
[im 36/144]
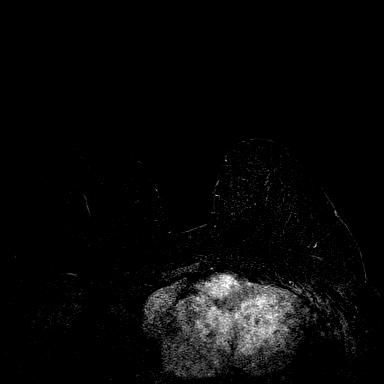
[im 72/144]
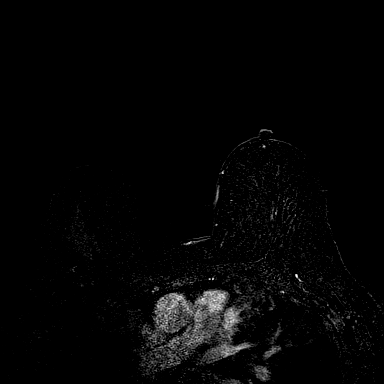
[im 108/144]
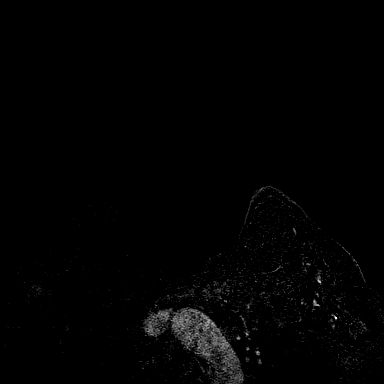
[im 144/144]
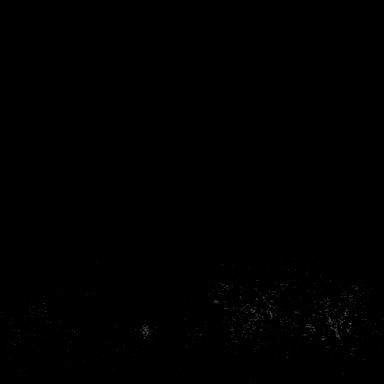

[Series 7: fl3d post-cm 20 · axial · 172.8mm · 0.94mm/px · 1 of 1 slices shown (3 of 3)]
[im 1/1]
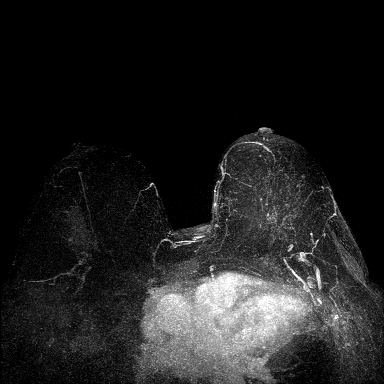

[Series 8: fl3d post-cm 3min · axial · 1.2mm · 0.94mm/px · z∈[-73,+99]mm · 6 of 144 slices shown]
[im 1/144]
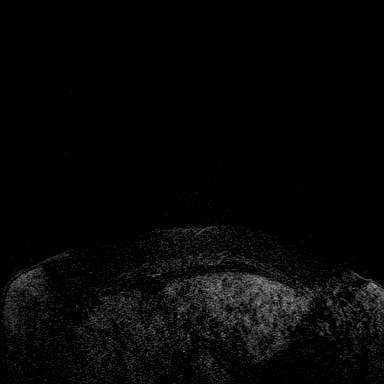
[im 29/144]
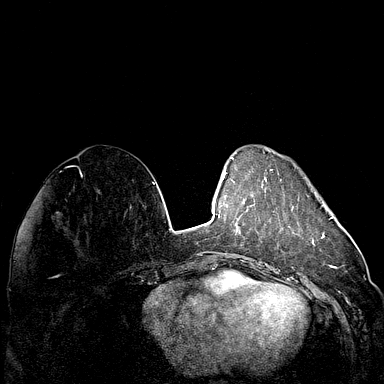
[im 58/144]
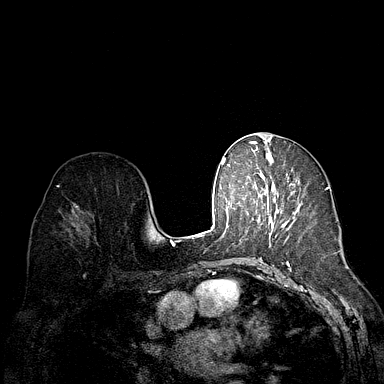
[im 86/144]
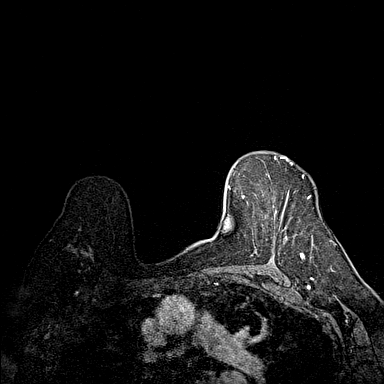
[im 115/144]
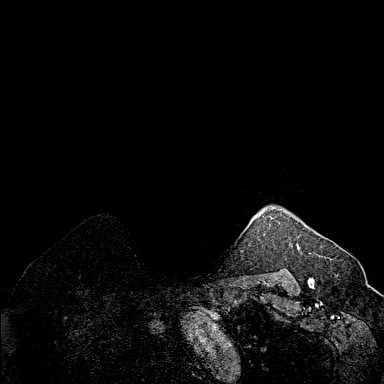
[im 144/144]
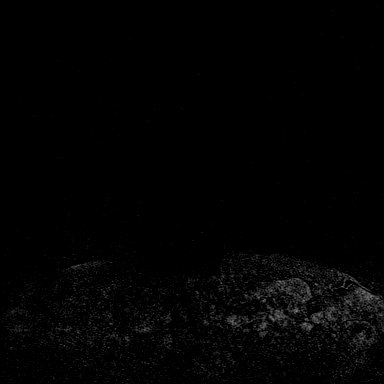

[Series 9: fl3d post-cm 3min_sub · axial · 1.2mm · 0.94mm/px · z∈[-73,-4]mm · 3 of 144 slices shown]
[im 1/144]
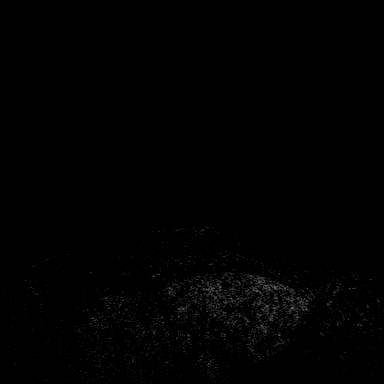
[im 29/144]
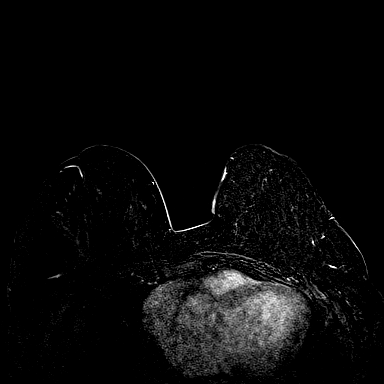
[im 58/144]
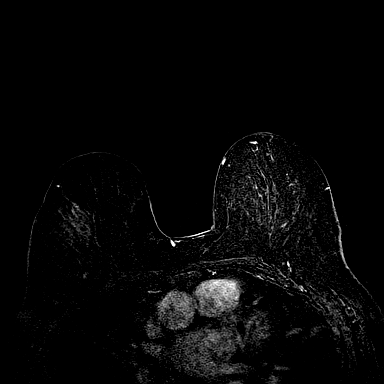

[31 of 48 positions shown; findings below may reference images not displayed]

THREE-DIMENSIONAL MR IMAGE RENDERING ON INDEPENDENT WORKSTATION:

Three-dimensional MR images were rendered by post-processing of the
original MR data on an independent workstation. The
three-dimensional MR images were interpreted, and findings are
reported in the following complete MRI report for this study. Three
dimensional images were evaluated at the independent [employer]
workstation
FINDINGS: Breast composition: b. Scattered fibroglandular tissue.

Background parenchymal enhancement: Mild

Right breast: There is an ill-defined area of low level, non mass
like enhancement in the upper outer right breast that measures 4.5 x
3.0 x 3.4 cm. Susceptibility artifact from the biopsy clip lies
along the superior margin of this. Kinetics within this area of
enhancement show moderate initial enhancement follow-up
predominantly by plateau enhancement with a few areas of washout
kinetics. No defined mass is seen within this area of enhancement or
elsewhere in the right breast. There are no other areas of abnormal
enhancement.

Left breast: No mass or abnormal enhancement.

Lymph nodes: No abnormal appearing lymph nodes.

Ancillary findings:  None.
IMPRESSION: 1. Ill-defined focal area of non masslike enhancement in the upper
outer right breast measuring 4.5 x 3.0 x 3.4 cm, the largest
dimension in the anterior to posterior direction. The biopsy clip
lies along the superior margin of this. There is no defined mass.
This entire area of enhancement could reflect carcinoma. Consider
MRI guided biopsy of the more anterior and superior extent of this,
away of the biopsy clip, if this would alter management.
2. No other areas of abnormal right breast enhancement to suggest
additional areas neoplastic disease.
3. No evidence of left breast carcinoma.
4. No adenopathy.

RECOMMENDATION:
1. Possible MRI guided biopsy of the anterior inferior extent of the
area of non mass enhancement associated with the right breast biopsy
clip if this would alter surgical management.

BI-RADS CATEGORY  6: Known biopsy-proven malignancy.

## 2018-04-12 ENCOUNTER — Encounter

## 2018-04-12 ENCOUNTER — Ambulatory Visit (INDEPENDENT_AMBULATORY_CARE_PROVIDER_SITE_OTHER): Payer: Medicare Other | Admitting: Cardiovascular Disease

## 2018-04-12 ENCOUNTER — Encounter: Payer: Self-pay | Admitting: Cardiovascular Disease

## 2018-04-12 VITALS — BP 140/70 | HR 63 | Ht 67.0 in | Wt 184.0 lb

## 2018-04-12 DIAGNOSIS — I2510 Atherosclerotic heart disease of native coronary artery without angina pectoris: Secondary | ICD-10-CM | POA: Diagnosis not present

## 2018-04-12 DIAGNOSIS — E7800 Pure hypercholesterolemia, unspecified: Secondary | ICD-10-CM

## 2018-04-12 DIAGNOSIS — E663 Overweight: Secondary | ICD-10-CM | POA: Diagnosis not present

## 2018-04-12 DIAGNOSIS — I5042 Chronic combined systolic (congestive) and diastolic (congestive) heart failure: Secondary | ICD-10-CM

## 2018-04-12 DIAGNOSIS — I10 Essential (primary) hypertension: Secondary | ICD-10-CM

## 2018-04-12 NOTE — Patient Instructions (Signed)
Medication Instructions:  Continue current medications If you need a refill on your cardiac medications before your next appointment, please call your pharmacy.   Follow-Up: At [facility], you and your health needs are our priority.  As part of our continuing mission to provide you with exceptional heart care, we have created designated Provider Care Teams.  These Care Teams include your primary Cardiologist (physician) and Advanced Practice Providers (APPs -  Physician Assistants and Nurse Practitioners) who all work together to provide you with the care you need, when you need it. You will need a follow up appointment in 12 months.  Please call our office 2 months in advance to schedule this appointment.  You may see Dr. Sallyanne Kuster or one of the following Advanced Practice Providers on your designated Care Team: Almyra Deforest, [place] . Fabian Sharp, PA-C  Any Other Special Instructions Will Be Listed Below (If Applicable).

## 2018-04-12 NOTE — Progress Notes (Signed)
Cardiology Office Note    Date:  04/12/2018   ID:  Kathy Murphy, DOB Sep 03, 1927, MRN [id number]  PCP:  Burnard Bunting, MD  Cardiologist:   Sanda Klein, MD   Chief Complaint  Patient presents with  . Follow-up    CAD     History of Present Illness:  Kathy Murphy is a 85 y.o. female returning in follow-up roughly 2 years 14 months placement of 2 overlapping drug-eluting stents in the subtotally occluded right coronary artery (2.5 x 38 mm Synergy). She also had high-grade stenoses in the diagonal artery, ramus intermedius branch, distal LAD which were left for medical therapy. Presence of widespread coronary lesions may explain the "false negative" perfusion pattern but with depressed left ventricular systolic function (78%). Upper limit of normal LVEDP at cath.   She does not have any cardiac complaints.  She has occasional left arm pain that occurs at rest (lying in bed or driving).  This never occurs with physical activity and is different from her previous angina which was retrosternal discomfort.  In September 2018 she underwent lumpectomy for cancer of the right breast.  She did not receive radiation or chemotherapy.  She has some issues related residual swelling in that area, but doing well from a cancer point of view.  She had right knee replacement in 2019.  She had a skin cancer removed from her left shoulder in December 2019.   Past Medical History:  Diagnosis Date  . Arthritis   . Breast cancer ([place])    Right  . Cancer ([place])    thyroid  . GERD (gastroesophageal reflux disease)   . GI bleed 1980   AFTER POLYP EXCISION  . Heart disease, hypertensive   . Hypercholesterolemia   . Hypertension   . Hypothyroidism   . Incontinence of urine   . Myalgia   . Myocardial infarction ([place])   . Obesity   . Pain in joints   . Retinal detachment   . Unsteady gait   . UTI (urinary tract infection)   . Vision loss of left eye   . Vomiting     Past Surgical History:    Procedure Laterality Date  . ABDOMINAL HYSTERECTOMY  1977  . BREAST BIOPSY    . BREAST LUMPECTOMY     x5  . CARDIAC CATHETERIZATION N/A 02/08/2016   Procedure: Left Heart Cath and Coronary Angiography;  Surgeon: Belva Crome, MD;  Location: [facility];  Service: Cardiovascular;  Laterality: N/A;  . CARDIAC CATHETERIZATION N/A 02/11/2016   Procedure: Coronary Stent Intervention;  Surgeon: Belva Crome, MD;  Location: [facility];  Service: Cardiovascular;  Laterality: N/A;  . CATARACT EXTRACTION, BILATERAL    . CERVICAL LAMINECTOMY    . [place]  . EVACUATION BREAST HEMATOMA Right 01/09/2017   Procedure: EVACUATION RIGHT MASTECTOMY FLUID COLLECTION;  Surgeon: Rolm Bookbinder, MD;  Location: [facility];  Service: General;  Laterality: Right;  . FOOT SURGERY    . NECK SURGERY    . THYROIDECTOMY  1957  . TOTAL HIP ARTHROPLASTY  2012   left  . TOTAL KNEE ARTHROPLASTY Right 05/18/2017   Procedure: RIGHT TOTAL KNEE ARTHROPLASTY;  Surgeon: Netta Cedars, MD;  Location: [facility];  Service: Orthopedics;  Laterality: Right;  . TOTAL MASTECTOMY Right 11/27/2016  . TOTAL MASTECTOMY Right 11/27/2016   Procedure: RIGHT TOTAL MASTECTOMY;  Surgeon: Rolm Bookbinder, MD;  Location: [facility];  Service: General;  Laterality: Right;  Current Medications: Outpatient Medications Prior to Visit  Medication Sig Dispense Refill  . acetaminophen (TYLENOL) 650 MG CR tablet Take 1,300 mg by mouth every 8 (eight) hours as needed for pain.    Marland Kitchen anastrozole (ARIMIDEX) 1 MG tablet TAKE 1 TABLET DAILY 90 tablet 4  . aspirin EC 81 MG tablet Take 81 mg by mouth daily.    . Carboxymethylcellulose Sodium (REFRESH TEARS OP) Apply 1 drop to eye daily.    . Cholecalciferol (VITAMIN D3) 125 MCG (5000 UT) CAPS Take 1 capsule by mouth daily.    . diazepam (VALIUM) 2 MG tablet Take 2 mg by mouth every 6 (six) hours as needed for anxiety. 1/2 tab BID prn    . Diclofenac Sodium CR 100 MG 24 hr tablet Take  100 mg by mouth daily.    Marland Kitchen levothyroxine (SYNTHROID, LEVOTHROID) 125 MCG tablet Take 125 mcg by mouth daily before breakfast.    . losartan (COZAAR) 100 MG tablet Take 100 mg by mouth daily.     . metoprolol succinate (TOPROL-XL) 100 MG 24 hr tablet TAKE 1 TABLET DAILY. TAKE WITH OR IMMEDIATELY FOLLOWING A MEAL 90 tablet 3  . pantoprazole (PROTONIX) 40 MG tablet Take 40 mg by mouth daily.      . rosuvastatin (CRESTOR) 10 MG tablet Take 10 mg by mouth every morning.    Marland Kitchen aspirin 81 MG chewable tablet Chew 1 tablet (81 mg total) by mouth 2 (two) times daily. (Patient taking differently: Chew 81 mg by mouth daily. ) 60 tablet 0  . hydrochlorothiazide (MICROZIDE) 12.5 MG capsule Take 1 capsule (12.5 mg total) by mouth 3 (three) times a week. (Patient not taking: Reported on 04/12/2018) 45 capsule 3  . oxyCODONE (ROXICODONE) 5 MG immediate release tablet Take 1 tablet (5 mg total) by mouth every 4 (four) hours as needed for severe pain. (Patient not taking: Reported on 12/28/2017) 30 tablet 0   No facility-administered medications prior to visit.      Allergies:   Adhesive [tape]; Other; and Latex   Social History   Socioeconomic History  . Marital status: Widowed    Spouse name: Not on file  . Number of children: Not on file  . Years of education: Not on file  . Highest education level: Not on file  Occupational History  . Not on file  Social Needs  . Financial resource strain: Not on file  . Food insecurity:    Worry: Not on file    Inability: Not on file  . Transportation needs:    Medical: Not on file    Non-medical: Not on file  Tobacco Use  . Smoking status: Never Smoker  . Smokeless tobacco: Never Used  Substance and Sexual Activity  . Alcohol use: No  . Drug use: No  . Sexual activity: Not on file  Lifestyle  . Physical activity:    Days per week: Not on file    Minutes per session: Not on file  . Stress: Not on file  Relationships  . Social connections:    Talks on  phone: Not on file    Gets together: Not on file    Attends religious service: Not on file    Active member of club or organization: Not on file    Attends meetings of clubs or organizations: Not on file    Relationship status: Not on file  Other Topics Concern  . Not on file  Social History Narrative  . Not on file  Family History:  The patient's family history includes Heart disease in her mother.   ROS:   Please see the history of present illness.    ROS all other systems are reviewed and are negative  PHYSICAL EXAM:   VS:  BP 140/70   Pulse 63   Ht 5\' 7"  (1.702 m)   Wt 184 lb (83.5 kg)   BMI 28.82 kg/m       General: Alert, oriented x3, no distress, appears well Head: no evidence of trauma, PERRL, EOMI, no exophtalmos or lid lag, no myxedema, no xanthelasma; normal ears, nose and oropharynx Neck: normal jugular venous pulsations and no hepatojugular reflux; brisk carotid pulses without delay and no carotid bruits Chest: clear to auscultation, no signs of consolidation by percussion or palpation, normal fremitus, symmetrical and full respiratory excursions.  Status post right mastectomy Cardiovascular: normal position and quality of the apical impulse, regular rhythm, normal first and second heart sounds, no murmurs, rubs or gallops Abdomen: no tenderness or distention, no masses by palpation, no abnormal pulsatility or arterial bruits, normal bowel sounds, no hepatosplenomegaly Extremities: no clubbing, cyanosis or edema; 2+ radial, ulnar and brachial pulses bilaterally; 2+ right femoral, posterior tibial and dorsalis pedis pulses; 2+ left femoral, posterior tibial and dorsalis pedis pulses; no subclavian or femoral bruits Neurological: grossly nonfocal Psych: Normal mood and affect   Wt Readings from Last 3 Encounters:  04/12/18 184 lb (83.5 kg)  12/28/17 182 lb 8 oz (82.8 kg)  05/18/17 183 lb (83 kg)      Studies/Labs Reviewed:   EKG:  EKG is ordered today.  Shows normal sinus rhythm with first-degree AV block (PR 254 ms), left atrial abnormality, left ventricular hypertrophy, QS pattern V1-V2  Labs: Lipid Panel     Component Value Date/Time   CHOL 116 04/16/2017 1037   TRIG 70 04/16/2017 1037   HDL 45 04/16/2017 1037   CHOLHDL 2.6 04/16/2017 1037   CHOLHDL 2.6 04/10/2016 0857   VLDL 13 04/10/2016 0857   LDLCALC 57 04/16/2017 1037   BMET    Component Value Date/Time   NA 141 12/28/2017 1158   NA 140 01/22/2017 0946   K 4.0 12/28/2017 1158   K 4.1 01/22/2017 0946   CL 104 12/28/2017 1158   CO2 28 12/28/2017 1158   CO2 29 01/22/2017 0946   GLUCOSE 80 12/28/2017 1158   GLUCOSE 101 01/22/2017 0946   BUN 18 12/28/2017 1158   BUN 17.9 01/22/2017 0946   CREATININE 0.80 12/28/2017 1158   CREATININE 0.8 01/22/2017 0946   CALCIUM 9.5 12/28/2017 1158   CALCIUM 9.1 01/22/2017 0946   GFRNONAA >60 12/28/2017 1158   GFRAA >60 12/28/2017 1158     ASSESSMENT:    1. CHF (congestive heart failure), NYHA class I, chronic, combined ([place])   2. Coronary artery disease involving native coronary artery of native heart without angina pectoris   3. Hypercholesterolemia   4. Essential hypertension   5. Overweight      PLAN:  In order of problems listed above:  1. CHF: Even after revascularization left ventricular systolic function only mildly depressed with EF 40-45%.  NYHA functional class I.  She is clinically euvolemic and does not require loop diuretics.  She is on maximum dose of angiotensin receptor blocker as well as a good dose of beta-blocker. 2. CAD: On aspirin and statin.  Clopidogrel has been stopped.  Asymptomatic.  Notes that her nuclear stress test did not show evidence of ischemia (possibly due to  some degree of balance defects?). 3. HLP: On statin with LDL at target. 4. HTN: Fair control.  Borderline elevated blood pressure today but at home typically around 130/70. 5. Overweight: Mildly overweight.  Medication  Adjustments/Labs and Tests Ordered: Current medicines are reviewed at length with the patient today.  Concerns regarding medicines are outlined above.  Medication changes, Labs and Tests ordered today are listed in the Patient Instructions below. Patient Instructions  Medication Instructions:  Continue current medications If you need a refill on your cardiac medications before your next appointment, please call your pharmacy.   Follow-Up: At [facility], you and your health needs are our priority.  As part of our continuing mission to provide you with exceptional heart care, we have created designated Provider Care Teams.  These Care Teams include your primary Cardiologist (physician) and Advanced Practice Providers (APPs -  Physician Assistants and Nurse Practitioners) who all work together to provide you with the care you need, when you need it. You will need a follow up appointment in 12 months.  Please call our office 2 months in advance to schedule this appointment.  You may see Dr. Sallyanne Kuster or one of the following Advanced Practice Providers on your designated Care Team: Almyra Deforest, [place] . Fabian Sharp, PA-C  Any Other Special Instructions Will Be Listed Below (If Applicable).       Signed, Sanda Klein, MD  04/12/2018 12:57 PM    [address], [place],   [address] Phone: [phone]; Fax: [phone]

## 2018-04-22 DIAGNOSIS — Z1231 Encounter for screening mammogram for malignant neoplasm of breast: Secondary | ICD-10-CM | POA: Diagnosis not present

## 2018-04-22 DIAGNOSIS — Z853 Personal history of malignant neoplasm of breast: Secondary | ICD-10-CM | POA: Diagnosis not present

## 2018-04-28 ENCOUNTER — Other Ambulatory Visit: Payer: Self-pay | Admitting: Cardiovascular Disease

## 2018-04-29 NOTE — Telephone Encounter (Signed)
Rx(s) sent to pharmacy electronically.  

## 2018-04-30 DIAGNOSIS — R921 Mammographic calcification found on diagnostic imaging of breast: Secondary | ICD-10-CM | POA: Diagnosis not present

## 2018-05-06 ENCOUNTER — Other Ambulatory Visit: Payer: Self-pay | Admitting: Radiology

## 2018-05-06 DIAGNOSIS — N6489 Other specified disorders of breast: Secondary | ICD-10-CM | POA: Diagnosis not present

## 2018-05-06 DIAGNOSIS — R921 Mammographic calcification found on diagnostic imaging of breast: Secondary | ICD-10-CM | POA: Diagnosis not present

## 2018-05-06 DIAGNOSIS — Z0389 Encounter for observation for other suspected diseases and conditions ruled out: Secondary | ICD-10-CM | POA: Diagnosis not present

## 2018-05-06 DIAGNOSIS — D242 Benign neoplasm of left breast: Secondary | ICD-10-CM | POA: Diagnosis not present

## 2018-07-17 DIAGNOSIS — Z961 Presence of intraocular lens: Secondary | ICD-10-CM | POA: Diagnosis not present

## 2018-07-17 DIAGNOSIS — H43811 Vitreous degeneration, right eye: Secondary | ICD-10-CM | POA: Diagnosis not present

## 2018-07-17 DIAGNOSIS — H31002 Unspecified chorioretinal scars, left eye: Secondary | ICD-10-CM | POA: Diagnosis not present

## 2018-07-17 DIAGNOSIS — H52201 Unspecified astigmatism, right eye: Secondary | ICD-10-CM | POA: Diagnosis not present

## 2018-07-29 DIAGNOSIS — Z85820 Personal history of malignant melanoma of skin: Secondary | ICD-10-CM | POA: Diagnosis not present

## 2018-07-29 DIAGNOSIS — L821 Other seborrheic keratosis: Secondary | ICD-10-CM | POA: Diagnosis not present

## 2018-07-29 DIAGNOSIS — D485 Neoplasm of uncertain behavior of skin: Secondary | ICD-10-CM | POA: Diagnosis not present

## 2018-07-29 DIAGNOSIS — Z85828 Personal history of other malignant neoplasm of skin: Secondary | ICD-10-CM | POA: Diagnosis not present

## 2018-07-29 DIAGNOSIS — D2272 Melanocytic nevi of left lower limb, including hip: Secondary | ICD-10-CM | POA: Diagnosis not present

## 2018-10-31 DIAGNOSIS — E7849 Other hyperlipidemia: Secondary | ICD-10-CM | POA: Diagnosis not present

## 2018-10-31 DIAGNOSIS — E038 Other specified hypothyroidism: Secondary | ICD-10-CM | POA: Diagnosis not present

## 2018-10-31 DIAGNOSIS — I10 Essential (primary) hypertension: Secondary | ICD-10-CM | POA: Diagnosis not present

## 2018-11-07 DIAGNOSIS — M169 Osteoarthritis of hip, unspecified: Secondary | ICD-10-CM | POA: Diagnosis not present

## 2018-11-07 DIAGNOSIS — E039 Hypothyroidism, unspecified: Secondary | ICD-10-CM | POA: Diagnosis not present

## 2018-11-07 DIAGNOSIS — K219 Gastro-esophageal reflux disease without esophagitis: Secondary | ICD-10-CM | POA: Diagnosis not present

## 2018-11-07 DIAGNOSIS — I10 Essential (primary) hypertension: Secondary | ICD-10-CM | POA: Diagnosis not present

## 2018-11-07 DIAGNOSIS — Q762 Congenital spondylolisthesis: Secondary | ICD-10-CM | POA: Diagnosis not present

## 2018-11-07 DIAGNOSIS — M25461 Effusion, right knee: Secondary | ICD-10-CM | POA: Diagnosis not present

## 2018-11-07 DIAGNOSIS — Z0000 Encounter for general adult medical examination without abnormal findings: Secondary | ICD-10-CM | POA: Diagnosis not present

## 2018-11-07 DIAGNOSIS — L03031 Cellulitis of right toe: Secondary | ICD-10-CM | POA: Diagnosis not present

## 2018-11-07 DIAGNOSIS — M546 Pain in thoracic spine: Secondary | ICD-10-CM | POA: Diagnosis not present

## 2018-11-07 DIAGNOSIS — E785 Hyperlipidemia, unspecified: Secondary | ICD-10-CM | POA: Diagnosis not present

## 2018-11-07 DIAGNOSIS — I2510 Atherosclerotic heart disease of native coronary artery without angina pectoris: Secondary | ICD-10-CM | POA: Diagnosis not present

## 2018-11-07 DIAGNOSIS — M25571 Pain in right ankle and joints of right foot: Secondary | ICD-10-CM | POA: Diagnosis not present

## 2018-11-27 DIAGNOSIS — E7849 Other hyperlipidemia: Secondary | ICD-10-CM | POA: Diagnosis not present

## 2018-11-27 DIAGNOSIS — R82998 Other abnormal findings in urine: Secondary | ICD-10-CM | POA: Diagnosis not present

## 2018-12-04 DIAGNOSIS — Z1212 Encounter for screening for malignant neoplasm of rectum: Secondary | ICD-10-CM | POA: Diagnosis not present

## 2019-01-01 ENCOUNTER — Other Ambulatory Visit: Payer: Self-pay

## 2019-01-01 ENCOUNTER — Inpatient Hospital Stay: Payer: Medicare Other | Attending: Oncology

## 2019-01-01 ENCOUNTER — Inpatient Hospital Stay (HOSPITAL_BASED_OUTPATIENT_CLINIC_OR_DEPARTMENT_OTHER): Payer: Medicare Other | Admitting: Oncology

## 2019-01-01 VITALS — BP 151/99 | HR 65 | Temp 98.5°F | Resp 18 | Wt 187.1 lb

## 2019-01-01 DIAGNOSIS — Z79811 Long term (current) use of aromatase inhibitors: Secondary | ICD-10-CM | POA: Insufficient documentation

## 2019-01-01 DIAGNOSIS — Z170 Estrogen receptor positive status [ER+]: Secondary | ICD-10-CM | POA: Diagnosis not present

## 2019-01-01 DIAGNOSIS — E039 Hypothyroidism, unspecified: Secondary | ICD-10-CM | POA: Diagnosis not present

## 2019-01-01 DIAGNOSIS — I2510 Atherosclerotic heart disease of native coronary artery without angina pectoris: Secondary | ICD-10-CM | POA: Diagnosis not present

## 2019-01-01 DIAGNOSIS — C50411 Malignant neoplasm of upper-outer quadrant of right female breast: Secondary | ICD-10-CM | POA: Diagnosis not present

## 2019-01-01 DIAGNOSIS — I10 Essential (primary) hypertension: Secondary | ICD-10-CM | POA: Insufficient documentation

## 2019-01-01 DIAGNOSIS — Z79899 Other long term (current) drug therapy: Secondary | ICD-10-CM | POA: Insufficient documentation

## 2019-01-01 LAB — CBC WITH DIFFERENTIAL/PLATELET
Abs Immature Granulocytes: 0.03 10*3/uL (ref 0.00–0.07)
Basophils Absolute: 0 10*3/uL (ref 0.0–0.1)
Basophils Relative: 0 %
Eosinophils Absolute: 0.2 10*3/uL (ref 0.0–0.5)
Eosinophils Relative: 3 %
HCT: 41.8 % (ref 36.0–46.0)
Hemoglobin: 13.7 g/dL (ref 12.0–15.0)
Immature Granulocytes: 0 %
Lymphocytes Relative: 22 %
Lymphs Abs: 1.5 10*3/uL (ref 0.7–4.0)
MCH: 30.6 pg (ref 26.0–34.0)
MCHC: 32.8 g/dL (ref 30.0–36.0)
MCV: 93.3 fL (ref 80.0–100.0)
Monocytes Absolute: 0.6 10*3/uL (ref 0.1–1.0)
Monocytes Relative: 9 %
Neutro Abs: 4.4 10*3/uL (ref 1.7–7.7)
Neutrophils Relative %: 66 %
Platelets: 151 10*3/uL (ref 150–400)
RBC: 4.48 MIL/uL (ref 3.87–5.11)
RDW: 13.3 % (ref 11.5–15.5)
WBC: 6.7 10*3/uL (ref 4.0–10.5)
nRBC: 0 % (ref 0.0–0.2)

## 2019-01-01 LAB — COMPREHENSIVE METABOLIC PANEL
ALT: 10 U/L (ref 0–44)
AST: 12 U/L — ABNORMAL LOW (ref 15–41)
Albumin: 4 g/dL (ref 3.5–5.0)
Alkaline Phosphatase: 92 U/L (ref 38–126)
Anion gap: 10 (ref 5–15)
BUN: 22 mg/dL (ref 8–23)
CO2: 25 mmol/L (ref 22–32)
Calcium: 9.3 mg/dL (ref 8.9–10.3)
Chloride: 105 mmol/L (ref 98–111)
Creatinine, Ser: 1.01 mg/dL — ABNORMAL HIGH (ref 0.44–1.00)
GFR calc Af Amer: 57 mL/min — ABNORMAL LOW (ref >60)
GFR calc non Af Amer: 49 mL/min — ABNORMAL LOW (ref >60)
Glucose, Bld: 140 mg/dL — ABNORMAL HIGH (ref 70–99)
Potassium: 4 mmol/L (ref 3.5–5.1)
Sodium: 140 mmol/L (ref 135–145)
Total Bilirubin: 0.7 mg/dL (ref 0.3–1.2)
Total Protein: 7 g/dL (ref 6.5–8.1)

## 2019-01-01 MED ORDER — ANASTROZOLE 1 MG PO TABS: 1.0000 mg | ORAL_TABLET | Freq: Every day | ORAL | 4 refills | Status: DC

## 2019-01-01 NOTE — Progress Notes (Signed)
[place]  Telephone:([phone]) [phone] Fax:(336) [phone]     ID: Kathy Murphy DOB: May 12, 1927  MR#: [id number]  JHE#:[id number]  Patient Care Team: Burnard Bunting, MD as PCP - General (Internal Medicine) Sanda Klein, MD as PCP - Cardiology (Cardiology) Caira Poche, Virgie Dad, MD as Consulting Physician (Oncology) Rolm Bookbinder, MD as Consulting Physician (General Surgery) Croitoru, Dani Gobble, MD as Consulting Physician (Cardiology) Netta Cedars, MD as Consulting Physician (Orthopedic Surgery) OTHER MD:  CHIEF COMPLAINT: Estrogen receptor positive invasive lobular breast cancer  CURRENT TREATMENT: Anastrozole   BREAST CANCER HISTORY: From the original intake note:  Rama had a change in her right breast and was referred to [facility] where on 04/14/2016 bilateral diagnostic mammography with tomography and right breast ultrasound was obtained. The breast density was category B. In the central right breast there was an oval mass with no other findings of concern. Ultrasound located a benign 0.3 cm cyst in the upper inner quadrant of the right breast correlating with the mammography findings. Routine mammography was recommended for one year.  However with further changes in the right breast note by the patient and her primary care MD, repeat right diagnostic mammography with tomography and repeat right breast ultrasonography 08/21/2016 now found a 3 cm area of asymmetry in the upper outer right breast which on physical exam measured approximately 2-1/2 cm at the 10:00 location 8 cm from the nipple. Ultrasound of this area showed a 0.5 cm hypoechoic mass with a larger 3 cm area of hazy isoechoic tissue with abnormal architecture. The right axilla showed 2 normal-appearing lymph nodes.  Biopsy of the right breast upper outer quadrant mass 08/28/2016 showed ([id number]) an invasive lobular carcinoma, E-cadherin negative, estrogen receptor 85% positive, progesterone receptor 100%  positive, both with strong staining intensity, with an MIB-1 of 5%, and no HER-2 implication, the signals ratio being 1.49 and the number per cell 3.21.  The patient's subsequent history is as detailed below.   INTERVAL HISTORY: Kathy Murphy returns today for follow-up and treatment of her estrogen receptor positive breast cancer. She was last seen here on 12/28/2017.   She continues on anastrozole.  She tolerates this with no side effects that she is aware of  Kathy Murphy's last bone density screening on 01/16/2018, showed a T-score of -1.6, which is considered osteopenic.    Since her last visit here, she underwent evaluation for some changes in the left breast, with biopsy 05/06/2018 showing fibroadenomatoid change with calcifications, no evidence of malignancy.   REVIEW OF SYSTEMS: [place]  Occasionally people come in after dark because they run out of gas or for other reasons.  She says someone rang her bell around 3 in the morning the other day.  She does not have a dog but she does have a good alarm system and a good video system for protection.  She obviously is very careful.  She denies any falls recently.  She does use a walker.  She does all her own housework.  She gets hot flushes 2 or 3 times a day.  She does not describe them as particularly bothersome.  She is taking appropriate pandemic precautions.  A detailed review of systems today was otherwise stable.   PAST MEDICAL HISTORY: Past Medical History:  Diagnosis Date  . Arthritis   . Breast cancer ([place])    Right  . Cancer ([place])    thyroid  . GERD (gastroesophageal reflux disease)   . GI bleed 1980  AFTER POLYP EXCISION  . Heart disease, hypertensive   . Hypercholesterolemia   . Hypertension   . Hypothyroidism   . Incontinence of urine   . Myalgia   . Myocardial infarction ([place])   . Obesity   . Pain in joints   . Retinal detachment   . Unsteady gait   . UTI (urinary tract infection)   . Vision  loss of left eye   . Vomiting     PAST SURGICAL HISTORY: Past Surgical History:  Procedure Laterality Date  . ABDOMINAL HYSTERECTOMY  1977  . BREAST BIOPSY    . BREAST LUMPECTOMY     x5  . CARDIAC CATHETERIZATION N/A 02/08/2016   Procedure: Left Heart Cath and Coronary Angiography;  Surgeon: Belva Crome, MD;  Location: [facility];  Service: Cardiovascular;  Laterality: N/A;  . CARDIAC CATHETERIZATION N/A 02/11/2016   Procedure: Coronary Stent Intervention;  Surgeon: Belva Crome, MD;  Location: [facility];  Service: Cardiovascular;  Laterality: N/A;  . CATARACT EXTRACTION, BILATERAL    . CERVICAL LAMINECTOMY    . [place]  . EVACUATION BREAST HEMATOMA Right 01/09/2017   Procedure: EVACUATION RIGHT MASTECTOMY FLUID COLLECTION;  Surgeon: Rolm Bookbinder, MD;  Location: [place];  Service: General;  Laterality: Right;  . FOOT SURGERY    . NECK SURGERY    . THYROIDECTOMY  1957  . TOTAL HIP ARTHROPLASTY  Jun 03, 2010   left  . TOTAL KNEE ARTHROPLASTY Right 05/18/2017   Procedure: RIGHT TOTAL KNEE ARTHROPLASTY;  Surgeon: Netta Cedars, MD;  Location: [place];  Service: Orthopedics;  Laterality: Right;  . TOTAL MASTECTOMY Right 11/27/2016  . TOTAL MASTECTOMY Right 11/27/2016   Procedure: RIGHT TOTAL MASTECTOMY;  Surgeon: Rolm Bookbinder, MD;  Location: [place];  Service: General;  Laterality: Right;    FAMILY HISTORY Family History  Problem Relation Age of Onset  . Heart disease Mother   The patient has little information regarding her father. Her mother died at age 57 in a nursing home from what the patient thinks may have been heart disease. The patient had no brothers and no sisters.  GYNECOLOGIC HISTORY:  No LMP recorded. Patient is postmenopausal. She thinks her first menstrual period may have been age 72. She never carried a child to term. She underwent hysterectomy with bilateral salpingo-oophorectomy in her 51s. She was on estrogen replacement until July  2018  SOCIAL HISTORY:  She is a retired Glass blower/designer for [employer]. Her husband died in 06-02-12. She lives by herself, with no pets, in a fairly large house on 6 acres she says. She does much of the house work herself. She also pays all her bills and keeps all her accounts.    ADVANCED DIRECTIVES: In place. She has named her close friend [name] as her healthcare power of attorney. He can be reached at (cell) [phone] or (home) [phone]   HEALTH MAINTENANCE: Social History   Tobacco Use  . Smoking status: Never Smoker  . Smokeless tobacco: Never Used  Substance Use Topics  . Alcohol use: No  . Drug use: No     Colonoscopy: November 2011/[facility]  PAP: 06/03/14  Bone density: 10/04/2016 at [facility] showed a T score of -1.4 osteopenic   Allergies  Allergen Reactions  . Adhesive [Tape] Other (See Comments)  . Other Other (See Comments)    SOME OF THE "MYCINS"...UNKNOWN REACTION.  Marland Kitchen Latex Rash    Current Outpatient Medications  Medication Sig Dispense Refill  .  anastrozole (ARIMIDEX) 1 MG tablet TAKE 1 TABLET DAILY 90 tablet 4  . aspirin EC 81 MG tablet Take 81 mg by mouth daily.    . Carboxymethylcellulose Sodium (REFRESH TEARS OP) Apply 1 drop to eye daily.    . Cholecalciferol (VITAMIN D3) 125 MCG (5000 UT) CAPS Take 1 capsule by mouth daily.    . Diclofenac Sodium CR 100 MG 24 hr tablet Take 100 mg by mouth daily.    Marland Kitchen levothyroxine (SYNTHROID, LEVOTHROID) 125 MCG tablet Take 125 mcg by mouth daily before breakfast.    . losartan (COZAAR) 100 MG tablet Take 100 mg by mouth daily.     . metoprolol succinate (TOPROL-XL) 100 MG 24 hr tablet Take 1 tablet (100 mg total) by mouth daily. 90 tablet 3  . pantoprazole (PROTONIX) 40 MG tablet Take 40 mg by mouth daily.      . rosuvastatin (CRESTOR) 10 MG tablet Take 10 mg by mouth every morning.     No current facility-administered medications for this visit.     OBJECTIVE: Older white woman using a walker   Vitals:   01/01/19 1305  BP: (!) 151/99  Pulse: 65  Resp: 18  Temp: 98.5 F (36.9 C)  SpO2: 96%     Body mass index is 29.3 kg/m.   Wt Readings from Last 3 Encounters:  01/01/19 187 lb 1.6 oz (84.9 kg)  04/12/18 184 lb (83.5 kg)  12/28/17 182 lb 8 oz (82.8 kg)      ECOG FS:2 - Symptomatic, <50% confined to bed  Sclerae unicteric, EOMs intact Wearing a mask No cervical or supraclavicular adenopathy Lungs no rales or rhonchi Heart regular rate and rhythm Abd soft, nontender, positive bowel sounds MSK some kyphosis but no focal spinal tenderness, no upper extremity lymphedema Neuro: nonfocal, well oriented, appropriate affect Breasts: The right breast is status post mastectomy, without reconstruction.  There is no evidence of local recurrence.  The left breast is benign.   LAB RESULTS:  CMP     Component Value Date/Time   NA 140 01/01/2019 1241   NA 140 01/22/2017 0946   K 4.0 01/01/2019 1241   K 4.1 01/22/2017 0946   CL 105 01/01/2019 1241   CO2 25 01/01/2019 1241   CO2 29 01/22/2017 0946   GLUCOSE 140 (H) 01/01/2019 1241   GLUCOSE 101 01/22/2017 0946   BUN 22 01/01/2019 1241   BUN 17.9 01/22/2017 0946   CREATININE 1.01 (H) 01/01/2019 1241   CREATININE 0.8 01/22/2017 0946   CALCIUM 9.3 01/01/2019 1241   CALCIUM 9.1 01/22/2017 0946   PROT 7.0 01/01/2019 1241   PROT 6.5 01/22/2017 0946   ALBUMIN 4.0 01/01/2019 1241   ALBUMIN 3.1 (L) 01/22/2017 0946   AST 12 (L) 01/01/2019 1241   AST 10 01/22/2017 0946   ALT 10 01/01/2019 1241   ALT 12 01/22/2017 0946   ALKPHOS 92 01/01/2019 1241   ALKPHOS 92 01/22/2017 0946   BILITOT 0.7 01/01/2019 1241   BILITOT 0.37 01/22/2017 0946   GFRNONAA 49 (L) 01/01/2019 1241   GFRAA 57 (L) 01/01/2019 1241    No results found for: TOTALPROTELP, ALBUMINELP, A1GS, A2GS, BETS, BETA2SER, GAMS, MSPIKE, SPEI  No results found for: KPAFRELGTCHN, LAMBDASER, KAPLAMBRATIO  Lab Results  Component Value Date   WBC 6.7 01/01/2019    NEUTROABS 4.4 01/01/2019   HGB 13.7 01/01/2019   HCT 41.8 01/01/2019   MCV 93.3 01/01/2019   PLT 151 01/01/2019      Chemistry  Component Value Date/Time   NA 140 01/01/2019 1241   NA 140 01/22/2017 0946   K 4.0 01/01/2019 1241   K 4.1 01/22/2017 0946   CL 105 01/01/2019 1241   CO2 25 01/01/2019 1241   CO2 29 01/22/2017 0946   BUN 22 01/01/2019 1241   BUN 17.9 01/22/2017 0946   CREATININE 1.01 (H) 01/01/2019 1241   CREATININE 0.8 01/22/2017 0946      Component Value Date/Time   CALCIUM 9.3 01/01/2019 1241   CALCIUM 9.1 01/22/2017 0946   ALKPHOS 92 01/01/2019 1241   ALKPHOS 92 01/22/2017 0946   AST 12 (L) 01/01/2019 1241   AST 10 01/22/2017 0946   ALT 10 01/01/2019 1241   ALT 12 01/22/2017 0946   BILITOT 0.7 01/01/2019 1241   BILITOT 0.37 01/22/2017 0946       No results found for: LABCA2  No components found for: NLGXQJ194  No results for input(s): INR in the last 168 hours.  Urinalysis    Component Value Date/Time   COLORURINE YELLOW 12/26/2012 0843   APPEARANCEUR CLOUDY (A) 12/26/2012 0843   LABSPEC 1.019 12/26/2012 0843   PHURINE 6.0 12/26/2012 0843   GLUCOSEU NEGATIVE 12/26/2012 0843   HGBUR SMALL (A) 12/26/2012 0843   BILIRUBINUR NEGATIVE 12/26/2012 0843   KETONESUR NEGATIVE 12/26/2012 0843   PROTEINUR NEGATIVE 12/26/2012 0843   UROBILINOGEN 0.2 12/26/2012 0843   NITRITE NEGATIVE 12/26/2012 0843   LEUKOCYTESUR NEGATIVE 12/26/2012 0843     STUDIES:  Mammography at [facility] is up-to-date.  ELIGIBLE FOR AVAILABLE RESEARCH PROTOCOL: No  ASSESSMENT: 85 y.o. [place], [place] woman status post right breast upper outer quadrant biopsy 08/28/2016 for a clinical T2 N0, stage IB invasive lobular carcinoma, grade not stated, estrogen and progesterone strongly positive, HER-2 nonamplified, with an MIB-1 of 5%.  (1) anastrozole started 09/25/2016  (a) bone density on 10/04/2016 at [facility] showed a T score of -1.4 osteopenic  (b) repeat bone density  01/16/2018 showed a T score of -1.6.  (2) Status post right mastectomy without sentinel lymph node sampling 11/27/2016 for a pT2 cN0, stage IB invasive lobular carcinoma, grade 1, with negative margins.  (3) no adjuvant radiation indicated  PLAN:  Kathy Murphy is doing remarkably well for her age and she is tolerating anastrozole well except for the hot flashes.  We discussed some options there but she really just wants to "live with it" and of course she is 2 years into her 5 years of treatment already.  I am more concerned about falls and we discussed that extensively.  She does use a cane at home and occasionally a walker.  She has not had any falls recently.  The other issue of course is [place]  I think she should get a dog.  She should test found the yard so she does not have to take the dog out but I think it would afford her some company and some protection.  Otherwise she will see me again in 1 year.  She will be 89 then.  She knows to call for any other issue that may develop before that visit. Emilene Roma, Virgie Dad, MD  01/01/19 1:43 PM Medical Oncology and Hematology [facility] [place], [place] [address] Tel. [phone]    Fax. [phone]  I, Jacqualyn Posey am acting as a Education administrator for Chauncey Cruel, MD.   I, Lurline Del MD, have reviewed the above documentation for accuracy and completeness, and I  agree with the above.

## 2019-01-03 ENCOUNTER — Telehealth: Payer: Self-pay | Admitting: Oncology

## 2019-01-03 NOTE — Telephone Encounter (Signed)
I talk with patient regarding schedule  

## 2019-02-03 DIAGNOSIS — Z85828 Personal history of other malignant neoplasm of skin: Secondary | ICD-10-CM | POA: Diagnosis not present

## 2019-02-03 DIAGNOSIS — L821 Other seborrheic keratosis: Secondary | ICD-10-CM | POA: Diagnosis not present

## 2019-02-03 DIAGNOSIS — D1801 Hemangioma of skin and subcutaneous tissue: Secondary | ICD-10-CM | POA: Diagnosis not present

## 2019-02-03 DIAGNOSIS — Z85820 Personal history of malignant melanoma of skin: Secondary | ICD-10-CM | POA: Diagnosis not present

## 2019-04-03 ENCOUNTER — Ambulatory Visit: Payer: Medicare Other | Attending: Internal Medicine

## 2019-04-03 DIAGNOSIS — Z23 Encounter for immunization: Secondary | ICD-10-CM | POA: Insufficient documentation

## 2019-04-03 NOTE — Progress Notes (Signed)
   Covid-19 Vaccination Clinic  Name:  Kathy Murphy    MRN: [id number] DOB: 1927-11-18  04/03/2019  Ms. [name] was observed post Covid-19 immunization for 15 minutes without incidence. She was provided with Vaccine Information Sheet and instruction to access the V-Safe system.   Ms. [name] was instructed to call 911 with any severe reactions post vaccine: Marland Kitchen Difficulty breathing  . Swelling of your face and throat  . A fast heartbeat  . A bad rash all over your body  . Dizziness and weakness    Immunizations Administered    Name Date Dose VIS Date Route   Pfizer COVID-19 Vaccine 04/03/2019  1:59 PM 0.3 mL 02/21/2019 Intramuscular   Manufacturer: [place]   Lot: [id number]   [place]: [id number]

## 2019-04-22 DIAGNOSIS — M25551 Pain in right hip: Secondary | ICD-10-CM | POA: Diagnosis not present

## 2019-04-22 DIAGNOSIS — C50919 Malignant neoplasm of unspecified site of unspecified female breast: Secondary | ICD-10-CM | POA: Diagnosis not present

## 2019-04-22 DIAGNOSIS — M169 Osteoarthritis of hip, unspecified: Secondary | ICD-10-CM | POA: Diagnosis not present

## 2019-04-23 ENCOUNTER — Other Ambulatory Visit: Payer: Self-pay

## 2019-04-23 ENCOUNTER — Ambulatory Visit (INDEPENDENT_AMBULATORY_CARE_PROVIDER_SITE_OTHER): Payer: Medicare Other | Admitting: Cardiovascular Disease

## 2019-04-23 ENCOUNTER — Encounter: Payer: Self-pay | Admitting: Cardiovascular Disease

## 2019-04-23 VITALS — BP 136/71 | HR 73 | Ht 67.0 in | Wt 188.6 lb

## 2019-04-23 DIAGNOSIS — I5022 Chronic systolic (congestive) heart failure: Secondary | ICD-10-CM

## 2019-04-23 DIAGNOSIS — E7800 Pure hypercholesterolemia, unspecified: Secondary | ICD-10-CM | POA: Diagnosis not present

## 2019-04-23 DIAGNOSIS — I2510 Atherosclerotic heart disease of native coronary artery without angina pectoris: Secondary | ICD-10-CM | POA: Diagnosis not present

## 2019-04-23 DIAGNOSIS — I10 Essential (primary) hypertension: Secondary | ICD-10-CM | POA: Diagnosis not present

## 2019-04-23 DIAGNOSIS — E663 Overweight: Secondary | ICD-10-CM

## 2019-04-23 NOTE — Progress Notes (Signed)
Cardiology Office Note    Date:  04/30/2019   ID:  CHARIYA DEOLIVEIRA, DOB 07-06-1927, MRN [id number]  PCP:  Burnard Bunting, MD  Cardiologist:   Sanda Klein, MD   Chief Complaint  Patient presents with  . Coronary Artery Disease     History of Present Illness:  Kathy Murphy is a 86 y.o. female returning in follow-up roughly 2 years 14 months placement of 2 overlapping drug-eluting stents in the subtotally occluded right coronary artery (2.5 x 38 mm Synergy). She also had high-grade stenoses in the diagonal artery, ramus intermedius branch, distal LAD which were left for medical therapy. Presence of widespread coronary lesions may explain the "false negative" perfusion pattern but with depressed left ventricular systolic function ([id number]). Upper limit of normal LVEDP at cath.   She feels well.  She does not have any cardiovascular complaints.  Because of right hip pain she has been using her walker a lot and is not moving around as much.  She has a smart watch and has been monitoring her heart rate.  The patient specifically denies any chest pain at rest exertion, dyspnea at rest or with exertion, orthopnea, paroxysmal nocturnal dyspnea, syncope, palpitations, focal neurological deficits, intermittent claudication, lower extremity edema, unexplained weight gain, cough, hemoptysis or wheezing.  In September 2018 she underwent lumpectomy for cancer of the right breast.  She did not receive radiation or chemotherapy.    She had right knee replacement in 2019.  She had a skin cancer removed from her left shoulder in December 2019.   Past Medical History:  Diagnosis Date  . Arthritis   . Breast cancer ([place])    Right  . Cancer ([place])    thyroid  . GERD (gastroesophageal reflux disease)   . GI bleed 1980   AFTER POLYP EXCISION  . Heart disease, hypertensive   . Hypercholesterolemia   . Hypertension   . Hypothyroidism   . Incontinence of urine   . Myalgia   . Myocardial infarction  ([place])   . Obesity   . Pain in joints   . Retinal detachment   . Unsteady gait   . UTI (urinary tract infection)   . Vision loss of left eye   . Vomiting     Past Surgical History:  Procedure Laterality Date  . ABDOMINAL HYSTERECTOMY  1977  . BREAST BIOPSY    . BREAST LUMPECTOMY     x5  . CARDIAC CATHETERIZATION N/A 02/08/2016   Procedure: Left Heart Cath and Coronary Angiography;  Surgeon: Belva Crome, MD;  Location: [facility];  Service: Cardiovascular;  Laterality: N/A;  . CARDIAC CATHETERIZATION N/A 02/11/2016   Procedure: Coronary Stent Intervention;  Surgeon: Belva Crome, MD;  Location: [facility];  Service: Cardiovascular;  Laterality: N/A;  . CATARACT EXTRACTION, BILATERAL    . CERVICAL LAMINECTOMY    . [place]  . EVACUATION BREAST HEMATOMA Right 01/09/2017   Procedure: EVACUATION RIGHT MASTECTOMY FLUID COLLECTION;  Surgeon: Rolm Bookbinder, MD;  Location: [facility];  Service: General;  Laterality: Right;  . FOOT SURGERY    . NECK SURGERY    . THYROIDECTOMY  1957  . TOTAL HIP ARTHROPLASTY  2012   left  . TOTAL KNEE ARTHROPLASTY Right 05/18/2017   Procedure: RIGHT TOTAL KNEE ARTHROPLASTY;  Surgeon: Netta Cedars, MD;  Location: [facility];  Service: Orthopedics;  Laterality: Right;  . TOTAL MASTECTOMY Right 11/27/2016  . TOTAL MASTECTOMY Right 11/27/2016  Procedure: RIGHT TOTAL MASTECTOMY;  Surgeon: Rolm Bookbinder, MD;  Location: [facility];  Service: General;  Laterality: Right;    Current Medications: Outpatient Medications Prior to Visit  Medication Sig Dispense Refill  . acetaminophen (TYLENOL) 650 MG CR tablet Take 650 mg by mouth every 8 (eight) hours as needed for pain.    Marland Kitchen anastrozole (ARIMIDEX) 1 MG tablet Take 1 tablet (1 mg total) by mouth daily. 90 tablet 4  . aspirin EC 81 MG tablet Take 81 mg by mouth daily.    . Carboxymethylcellulose Sodium (REFRESH TEARS OP) Apply 1 drop to eye daily.    . Cholecalciferol (VITAMIN D3) 125 MCG  (5000 UT) CAPS Take 1 capsule by mouth daily.    . diazepam (VALIUM) 2 MG tablet Take 2 mg by mouth every 6 (six) hours as needed for anxiety.    . Diclofenac Sodium CR 100 MG 24 hr tablet Take 100 mg by mouth daily.    Marland Kitchen levothyroxine (SYNTHROID, LEVOTHROID) 125 MCG tablet Take 125 mcg by mouth daily before breakfast.    . losartan (COZAAR) 100 MG tablet Take 100 mg by mouth daily.     . metoprolol succinate (TOPROL-XL) 100 MG 24 hr tablet Take 1 tablet (100 mg total) by mouth daily. 90 tablet 3  . pantoprazole (PROTONIX) 40 MG tablet Take 40 mg by mouth daily.      . rosuvastatin (CRESTOR) 10 MG tablet Take 10 mg by mouth every morning.     No facility-administered medications prior to visit.     Allergies:   Adhesive [tape], Other, and Latex   Social History   Socioeconomic History  . Marital status: Widowed    Spouse name: Not on file  . Number of children: Not on file  . Years of education: Not on file  . Highest education level: Not on file  Occupational History  . Not on file  Tobacco Use  . Smoking status: Never Smoker  . Smokeless tobacco: Never Used  Substance and Sexual Activity  . Alcohol use: No  . Drug use: No  . Sexual activity: Not on file  Other Topics Concern  . Not on file  Social History Narrative  . Not on file   Social Determinants of Health   Financial Resource Strain:   . Difficulty of Paying Living Expenses: Not on file  Food Insecurity:   . Worried About Charity fundraiser in the Last Year: Not on file  . Ran Out of Food in the Last Year: Not on file  Transportation Needs:   . Lack of Transportation (Medical): Not on file  . Lack of Transportation (Non-Medical): Not on file  Physical Activity:   . Days of Exercise per Week: Not on file  . Minutes of Exercise per Session: Not on file  Stress:   . Feeling of Stress : Not on file  Social Connections:   . Frequency of Communication with Friends and Family: Not on file  . Frequency of Social  Gatherings with Friends and Family: Not on file  . Attends Religious Services: Not on file  . Active Member of Clubs or Organizations: Not on file  . Attends Archivist Meetings: Not on file  . Marital Status: Not on file     Family History:  The patient's family history includes Heart disease in her mother.   ROS:   Please see the history of present illness.    ROS all other systems are reviewed and are  negative  PHYSICAL EXAM:   VS:  BP 136/71   Pulse 73   Ht 5\' 7"  (1.702 m)   Wt 188 lb 9.6 oz (85.5 kg)   BMI 29.54 kg/m     General: Alert, oriented x3, no distress, overweight Head: no evidence of trauma, PERRL, EOMI, no exophtalmos or lid lag, no myxedema, no xanthelasma; normal ears, nose and oropharynx Neck: normal jugular venous pulsations and no hepatojugular reflux; brisk carotid pulses without delay and no carotid bruits Chest: clear to auscultation, no signs of consolidation by percussion or palpation, normal fremitus, symmetrical and full respiratory excursions Cardiovascular: normal position and quality of the apical impulse, regular rhythm, normal first and second heart sounds, no murmurs, rubs or gallops Abdomen: no tenderness or distention, no masses by palpation, no abnormal pulsatility or arterial bruits, normal bowel sounds, no hepatosplenomegaly Extremities: no clubbing, cyanosis or edema; 2+ radial, ulnar and brachial pulses bilaterally; 2+ right femoral, posterior tibial and dorsalis pedis pulses; 2+ left femoral, posterior tibial and dorsalis pedis pulses; no subclavian or femoral bruits Neurological: grossly nonfocal Psych: Normal mood and affect    Wt Readings from Last 3 Encounters:  04/23/19 188 lb 9.6 oz (85.5 kg)  01/01/19 187 lb 1.6 oz (84.9 kg)  04/12/18 184 lb (83.5 kg)      Studies/Labs Reviewed:   EKG:  EKG is ordered today.  It shows sinus rhythm with first-degree AV block and a single PVC, left atrial abnormality, LVH with  secondary repolarization changes, pre-existing QS pattern in leads V1 and V2  Labs: Lipid Panel     Component Value Date/Time   CHOL 116 04/16/2017 1037   TRIG 70 04/16/2017 1037   HDL 45 04/16/2017 1037   CHOLHDL 2.6 04/16/2017 1037   CHOLHDL 2.6 04/10/2016 0857   VLDL 13 04/10/2016 0857   LDLCALC 57 04/16/2017 1037   10/31/2018 Total cholesterol 116, HDL 38, LDL 66, triglycerides 59 Hemoglobin 13.7 on 01/01/2019  BMET    Component Value Date/Time   NA 140 01/01/2019 1241   NA 140 01/22/2017 0946   K 4.0 01/01/2019 1241   K 4.1 01/22/2017 0946   CL 105 01/01/2019 1241   CO2 25 01/01/2019 1241   CO2 29 01/22/2017 0946   GLUCOSE 140 (H) 01/01/2019 1241   GLUCOSE 101 01/22/2017 0946   BUN 22 01/01/2019 1241   BUN 17.9 01/22/2017 0946   CREATININE 1.01 (H) 01/01/2019 1241   CREATININE 0.8 01/22/2017 0946   CALCIUM 9.3 01/01/2019 1241   CALCIUM 9.1 01/22/2017 0946   GFRNONAA 49 (L) 01/01/2019 1241   GFRAA 57 (L) 01/01/2019 1241     ASSESSMENT:    1. Chronic systolic heart failure ([place])   2. Coronary artery disease involving native coronary artery of native heart without angina pectoris   3. Hypercholesterolemia   4. Essential hypertension   5. Overweight (BMI 25.0-29.9)      PLAN:  In order of problems listed above:  1. CHF: Mildly depressed left ventricular systolic function with EF 40-45%, but appears to have NYHA functional class I (B it recently very sedentary and harder to say).  On ARB and beta-blocker therapy and does not require loop diuretics. 2. CAD: Asymptomatic.  On aspirin and statin.  Important to remember that her nuclear stress test did not show evidence of ischemia (possibly due to some degree of balanced defects?). 3. HLP: On statin with LDL cholesterol at target 4. HTN: Adequate control.  Reminded her about sodium restriction.  It  is possible that her blood pressure is a little higher since she is less physically active. 5. Overweight: Try to  limit carbs and in general calorie intake while she is less physically active.  Medication Adjustments/Labs and Tests Ordered: Current medicines are reviewed at length with the patient today.  Concerns regarding medicines are outlined above.  Medication changes, Labs and Tests ordered today are listed in the Patient Instructions below. Patient Instructions  Medication Instructions:  No changes *If you need a refill on your cardiac medications before your next appointment, please call your pharmacy*  Lab Work: None ordered If you have labs (blood work) drawn today and your tests are completely normal, you will receive your results only by: Marland Kitchen MyChart Message (if you have MyChart) OR . A paper copy in the mail If you have any lab test that is abnormal or we need to change your treatment, we will call you to review the results.  Testing/Procedures: None ordered  Follow-Up: At [facility], you and your health needs are our priority.  As part of our continuing mission to provide you with exceptional heart care, we have created designated Provider Care Teams.  These Care Teams include your primary Cardiologist (physician) and Advanced Practice Providers (APPs -  Physician Assistants and Nurse Practitioners) who all work together to provide you with the care you need, when you need it.  Your next appointment:   12 month(s)  The format for your next appointment:   In Person  Provider:   You may see Sanda Klein, MD or one of the following Advanced Practice Providers on your designated Care Team:    Almyra Deforest, PA-C  Fabian Sharp, [place] or   Roby Lofts, PA-C     Signed, Sanda Klein, MD  04/30/2019 6:31 PM    [address], [place], [place]  [address] Phone: [phone]; Fax: [phone]

## 2019-04-23 NOTE — Patient Instructions (Signed)
Medication Instructions:  No changes *If you need a refill on your cardiac medications before your next appointment, please call your pharmacy*  Lab Work: None ordered If you have labs (blood work) drawn today and your tests are completely normal, you will receive your results only by: . MyChart Message (if you have MyChart) OR . A paper copy in the mail If you have any lab test that is abnormal or we need to change your treatment, we will call you to review the results.  Testing/Procedures: None ordered  Follow-Up: At [facility], you and your health needs are our priority.  As part of our continuing mission to provide you with exceptional heart care, we have created designated Provider Care Teams.  These Care Teams include your primary Cardiologist (physician) and Advanced Practice Providers (APPs -  Physician Assistants and Nurse Practitioners) who all work together to provide you with the care you need, when you need it.  Your next appointment:   12 month(s)  The format for your next appointment:   In Person  Provider:   You may see Mihai Croitoru, MD or one of the following Advanced Practice Providers on your designated Care Team:    Hao Meng, PA-C  Angela Duke, PA-C or   Krista Kroeger, PA-C  

## 2019-04-24 ENCOUNTER — Ambulatory Visit: Payer: Medicare Other | Attending: Internal Medicine

## 2019-04-24 DIAGNOSIS — Z23 Encounter for immunization: Secondary | ICD-10-CM | POA: Insufficient documentation

## 2019-04-24 NOTE — Progress Notes (Signed)
   Covid-19 Vaccination Clinic  Name:  Kathy Murphy    MRN: [id number] DOB: 05/08/1927  04/24/2019  Kathy Murphy was observed post Covid-19 immunization for 15 minutes without incidence. She was provided with Vaccine Information Sheet and instruction to access the V-Safe system.   Kathy Murphy was instructed to call 911 with any severe reactions post vaccine: Marland Kitchen Difficulty breathing  . Swelling of your face and throat  . A fast heartbeat  . A bad rash all over your body  . Dizziness and weakness    Immunizations Administered    Name Date Dose VIS Date Route   Pfizer COVID-19 Vaccine 04/24/2019  2:34 PM 0.3 mL 02/21/2019 Intramuscular   Manufacturer: [place]   Lot: [id number]   [place]: [id number]

## 2019-04-28 DIAGNOSIS — M25551 Pain in right hip: Secondary | ICD-10-CM | POA: Insufficient documentation

## 2019-04-30 ENCOUNTER — Encounter: Payer: Self-pay | Admitting: Cardiovascular Disease

## 2019-05-03 ENCOUNTER — Other Ambulatory Visit: Payer: Self-pay | Admitting: Cardiovascular Disease

## 2019-05-06 DIAGNOSIS — M25551 Pain in right hip: Secondary | ICD-10-CM | POA: Diagnosis not present

## 2019-05-06 DIAGNOSIS — M48062 Spinal stenosis, lumbar region with neurogenic claudication: Secondary | ICD-10-CM | POA: Diagnosis not present

## 2019-05-06 DIAGNOSIS — Z96651 Presence of right artificial knee joint: Secondary | ICD-10-CM | POA: Diagnosis not present

## 2019-05-06 DIAGNOSIS — M1711 Unilateral primary osteoarthritis, right knee: Secondary | ICD-10-CM | POA: Diagnosis not present

## 2019-05-08 DIAGNOSIS — R928 Other abnormal and inconclusive findings on diagnostic imaging of breast: Secondary | ICD-10-CM | POA: Diagnosis not present

## 2019-05-08 DIAGNOSIS — N6082 Other benign mammary dysplasias of left breast: Secondary | ICD-10-CM | POA: Diagnosis not present

## 2019-05-13 DIAGNOSIS — Z9181 History of falling: Secondary | ICD-10-CM | POA: Diagnosis not present

## 2019-05-13 DIAGNOSIS — R531 Weakness: Secondary | ICD-10-CM | POA: Diagnosis not present

## 2019-05-13 DIAGNOSIS — R2689 Other abnormalities of gait and mobility: Secondary | ICD-10-CM | POA: Diagnosis not present

## 2019-05-13 DIAGNOSIS — M25551 Pain in right hip: Secondary | ICD-10-CM | POA: Diagnosis not present

## 2019-05-15 DIAGNOSIS — M25461 Effusion, right knee: Secondary | ICD-10-CM | POA: Diagnosis not present

## 2019-05-15 DIAGNOSIS — R351 Nocturia: Secondary | ICD-10-CM | POA: Insufficient documentation

## 2019-05-15 DIAGNOSIS — Z1331 Encounter for screening for depression: Secondary | ICD-10-CM | POA: Diagnosis not present

## 2019-05-15 DIAGNOSIS — R2689 Other abnormalities of gait and mobility: Secondary | ICD-10-CM | POA: Diagnosis not present

## 2019-05-15 DIAGNOSIS — I10 Essential (primary) hypertension: Secondary | ICD-10-CM | POA: Diagnosis not present

## 2019-05-15 DIAGNOSIS — E663 Overweight: Secondary | ICD-10-CM | POA: Diagnosis not present

## 2019-05-15 DIAGNOSIS — K219 Gastro-esophageal reflux disease without esophagitis: Secondary | ICD-10-CM | POA: Diagnosis not present

## 2019-05-15 DIAGNOSIS — M169 Osteoarthritis of hip, unspecified: Secondary | ICD-10-CM | POA: Diagnosis not present

## 2019-05-20 DIAGNOSIS — M25551 Pain in right hip: Secondary | ICD-10-CM | POA: Diagnosis not present

## 2019-05-20 DIAGNOSIS — Z9181 History of falling: Secondary | ICD-10-CM | POA: Diagnosis not present

## 2019-05-20 DIAGNOSIS — R531 Weakness: Secondary | ICD-10-CM | POA: Diagnosis not present

## 2019-05-20 DIAGNOSIS — R2689 Other abnormalities of gait and mobility: Secondary | ICD-10-CM | POA: Diagnosis not present

## 2019-05-22 DIAGNOSIS — R2689 Other abnormalities of gait and mobility: Secondary | ICD-10-CM | POA: Diagnosis not present

## 2019-05-22 DIAGNOSIS — M25551 Pain in right hip: Secondary | ICD-10-CM | POA: Diagnosis not present

## 2019-05-22 DIAGNOSIS — R531 Weakness: Secondary | ICD-10-CM | POA: Diagnosis not present

## 2019-05-22 DIAGNOSIS — Z9181 History of falling: Secondary | ICD-10-CM | POA: Diagnosis not present

## 2019-05-27 DIAGNOSIS — R2689 Other abnormalities of gait and mobility: Secondary | ICD-10-CM | POA: Diagnosis not present

## 2019-05-27 DIAGNOSIS — R531 Weakness: Secondary | ICD-10-CM | POA: Diagnosis not present

## 2019-05-27 DIAGNOSIS — M25551 Pain in right hip: Secondary | ICD-10-CM | POA: Diagnosis not present

## 2019-05-27 DIAGNOSIS — Z9181 History of falling: Secondary | ICD-10-CM | POA: Diagnosis not present

## 2019-06-03 DIAGNOSIS — R2689 Other abnormalities of gait and mobility: Secondary | ICD-10-CM | POA: Diagnosis not present

## 2019-06-03 DIAGNOSIS — Z9181 History of falling: Secondary | ICD-10-CM | POA: Diagnosis not present

## 2019-06-03 DIAGNOSIS — M25551 Pain in right hip: Secondary | ICD-10-CM | POA: Diagnosis not present

## 2019-06-03 DIAGNOSIS — R531 Weakness: Secondary | ICD-10-CM | POA: Diagnosis not present

## 2019-06-05 DIAGNOSIS — R531 Weakness: Secondary | ICD-10-CM | POA: Diagnosis not present

## 2019-06-05 DIAGNOSIS — M25551 Pain in right hip: Secondary | ICD-10-CM | POA: Diagnosis not present

## 2019-06-05 DIAGNOSIS — Z9181 History of falling: Secondary | ICD-10-CM | POA: Diagnosis not present

## 2019-06-05 DIAGNOSIS — R2689 Other abnormalities of gait and mobility: Secondary | ICD-10-CM | POA: Diagnosis not present

## 2019-06-10 DIAGNOSIS — R2689 Other abnormalities of gait and mobility: Secondary | ICD-10-CM | POA: Diagnosis not present

## 2019-06-10 DIAGNOSIS — R531 Weakness: Secondary | ICD-10-CM | POA: Diagnosis not present

## 2019-06-10 DIAGNOSIS — M25551 Pain in right hip: Secondary | ICD-10-CM | POA: Diagnosis not present

## 2019-06-10 DIAGNOSIS — Z9181 History of falling: Secondary | ICD-10-CM | POA: Diagnosis not present

## 2019-06-12 DIAGNOSIS — M25551 Pain in right hip: Secondary | ICD-10-CM | POA: Diagnosis not present

## 2019-06-12 DIAGNOSIS — R2689 Other abnormalities of gait and mobility: Secondary | ICD-10-CM | POA: Diagnosis not present

## 2019-06-12 DIAGNOSIS — Z9181 History of falling: Secondary | ICD-10-CM | POA: Diagnosis not present

## 2019-06-12 DIAGNOSIS — R531 Weakness: Secondary | ICD-10-CM | POA: Diagnosis not present

## 2019-06-17 DIAGNOSIS — Z9181 History of falling: Secondary | ICD-10-CM | POA: Diagnosis not present

## 2019-06-17 DIAGNOSIS — R2689 Other abnormalities of gait and mobility: Secondary | ICD-10-CM | POA: Diagnosis not present

## 2019-06-17 DIAGNOSIS — R531 Weakness: Secondary | ICD-10-CM | POA: Diagnosis not present

## 2019-06-17 DIAGNOSIS — M25551 Pain in right hip: Secondary | ICD-10-CM | POA: Diagnosis not present

## 2019-06-19 DIAGNOSIS — Z9181 History of falling: Secondary | ICD-10-CM | POA: Diagnosis not present

## 2019-06-19 DIAGNOSIS — M25551 Pain in right hip: Secondary | ICD-10-CM | POA: Diagnosis not present

## 2019-06-19 DIAGNOSIS — R2689 Other abnormalities of gait and mobility: Secondary | ICD-10-CM | POA: Diagnosis not present

## 2019-06-19 DIAGNOSIS — R531 Weakness: Secondary | ICD-10-CM | POA: Diagnosis not present

## 2019-06-24 DIAGNOSIS — Z9181 History of falling: Secondary | ICD-10-CM | POA: Diagnosis not present

## 2019-06-24 DIAGNOSIS — M25551 Pain in right hip: Secondary | ICD-10-CM | POA: Diagnosis not present

## 2019-06-24 DIAGNOSIS — R531 Weakness: Secondary | ICD-10-CM | POA: Diagnosis not present

## 2019-06-24 DIAGNOSIS — R2689 Other abnormalities of gait and mobility: Secondary | ICD-10-CM | POA: Diagnosis not present

## 2019-06-26 DIAGNOSIS — R2689 Other abnormalities of gait and mobility: Secondary | ICD-10-CM | POA: Diagnosis not present

## 2019-06-26 DIAGNOSIS — M25551 Pain in right hip: Secondary | ICD-10-CM | POA: Diagnosis not present

## 2019-06-26 DIAGNOSIS — Z9181 History of falling: Secondary | ICD-10-CM | POA: Diagnosis not present

## 2019-06-26 DIAGNOSIS — R531 Weakness: Secondary | ICD-10-CM | POA: Diagnosis not present

## 2019-07-01 DIAGNOSIS — M25551 Pain in right hip: Secondary | ICD-10-CM | POA: Diagnosis not present

## 2019-07-01 DIAGNOSIS — R531 Weakness: Secondary | ICD-10-CM | POA: Diagnosis not present

## 2019-07-01 DIAGNOSIS — Z9181 History of falling: Secondary | ICD-10-CM | POA: Diagnosis not present

## 2019-07-01 DIAGNOSIS — R2689 Other abnormalities of gait and mobility: Secondary | ICD-10-CM | POA: Diagnosis not present

## 2019-07-03 DIAGNOSIS — R531 Weakness: Secondary | ICD-10-CM | POA: Diagnosis not present

## 2019-07-03 DIAGNOSIS — M25551 Pain in right hip: Secondary | ICD-10-CM | POA: Diagnosis not present

## 2019-07-03 DIAGNOSIS — R2689 Other abnormalities of gait and mobility: Secondary | ICD-10-CM | POA: Diagnosis not present

## 2019-07-03 DIAGNOSIS — Z9181 History of falling: Secondary | ICD-10-CM | POA: Diagnosis not present

## 2019-07-08 DIAGNOSIS — Z9181 History of falling: Secondary | ICD-10-CM | POA: Diagnosis not present

## 2019-07-08 DIAGNOSIS — R2689 Other abnormalities of gait and mobility: Secondary | ICD-10-CM | POA: Diagnosis not present

## 2019-07-08 DIAGNOSIS — R531 Weakness: Secondary | ICD-10-CM | POA: Diagnosis not present

## 2019-07-08 DIAGNOSIS — M25551 Pain in right hip: Secondary | ICD-10-CM | POA: Diagnosis not present

## 2019-07-10 DIAGNOSIS — Z9181 History of falling: Secondary | ICD-10-CM | POA: Diagnosis not present

## 2019-07-10 DIAGNOSIS — M25551 Pain in right hip: Secondary | ICD-10-CM | POA: Diagnosis not present

## 2019-07-10 DIAGNOSIS — R2689 Other abnormalities of gait and mobility: Secondary | ICD-10-CM | POA: Diagnosis not present

## 2019-07-10 DIAGNOSIS — R531 Weakness: Secondary | ICD-10-CM | POA: Diagnosis not present

## 2019-07-21 DIAGNOSIS — Z961 Presence of intraocular lens: Secondary | ICD-10-CM | POA: Diagnosis not present

## 2019-07-21 DIAGNOSIS — H52201 Unspecified astigmatism, right eye: Secondary | ICD-10-CM | POA: Diagnosis not present

## 2019-07-21 DIAGNOSIS — H43811 Vitreous degeneration, right eye: Secondary | ICD-10-CM | POA: Diagnosis not present

## 2019-07-21 DIAGNOSIS — H31002 Unspecified chorioretinal scars, left eye: Secondary | ICD-10-CM | POA: Diagnosis not present

## 2019-11-04 DIAGNOSIS — E785 Hyperlipidemia, unspecified: Secondary | ICD-10-CM | POA: Diagnosis not present

## 2019-11-04 DIAGNOSIS — E039 Hypothyroidism, unspecified: Secondary | ICD-10-CM | POA: Diagnosis not present

## 2019-11-10 DIAGNOSIS — E039 Hypothyroidism, unspecified: Secondary | ICD-10-CM | POA: Diagnosis not present

## 2019-11-10 DIAGNOSIS — K921 Melena: Secondary | ICD-10-CM | POA: Diagnosis not present

## 2019-11-10 DIAGNOSIS — E663 Overweight: Secondary | ICD-10-CM | POA: Diagnosis not present

## 2019-11-10 DIAGNOSIS — N3281 Overactive bladder: Secondary | ICD-10-CM | POA: Diagnosis not present

## 2019-11-10 DIAGNOSIS — K219 Gastro-esophageal reflux disease without esophagitis: Secondary | ICD-10-CM | POA: Diagnosis not present

## 2019-11-10 DIAGNOSIS — I10 Essential (primary) hypertension: Secondary | ICD-10-CM | POA: Diagnosis not present

## 2019-11-10 DIAGNOSIS — I2510 Atherosclerotic heart disease of native coronary artery without angina pectoris: Secondary | ICD-10-CM | POA: Diagnosis not present

## 2019-11-10 DIAGNOSIS — G4709 Other insomnia: Secondary | ICD-10-CM | POA: Diagnosis not present

## 2019-11-10 DIAGNOSIS — R2689 Other abnormalities of gait and mobility: Secondary | ICD-10-CM | POA: Diagnosis not present

## 2019-11-10 DIAGNOSIS — K589 Irritable bowel syndrome without diarrhea: Secondary | ICD-10-CM | POA: Diagnosis not present

## 2019-11-10 DIAGNOSIS — R82998 Other abnormal findings in urine: Secondary | ICD-10-CM | POA: Diagnosis not present

## 2019-11-10 DIAGNOSIS — E785 Hyperlipidemia, unspecified: Secondary | ICD-10-CM | POA: Diagnosis not present

## 2019-11-10 DIAGNOSIS — Z0000 Encounter for general adult medical examination without abnormal findings: Secondary | ICD-10-CM | POA: Diagnosis not present

## 2019-12-16 ENCOUNTER — Ambulatory Visit: Payer: Medicare Other | Attending: Internal Medicine

## 2019-12-16 DIAGNOSIS — Z23 Encounter for immunization: Secondary | ICD-10-CM

## 2019-12-16 NOTE — Progress Notes (Signed)
   Covid-19 Vaccination Clinic  Name:  Kathy Murphy    MRN: [id number] DOB: Mar 27, 1927  12/16/2019  Kathy Murphy was observed post Covid-19 immunization for 15 minutes without incident. She was provided with Vaccine Information Sheet and instruction to access the V-Safe system.   Kathy Murphy was instructed to call 911 with any severe reactions post vaccine: Marland Kitchen Difficulty breathing  . Swelling of face and throat  . A fast heartbeat  . A bad rash all over body  . Dizziness and weakness

## 2020-01-04 NOTE — Progress Notes (Signed)
[place]  Telephone:(336) [phone] Fax:(336) [phone]     ID: Kathy Murphy DOB: October 25, 1927  MR#: [id number]  XAJ#:[id number]  Patient Care Team: Burnard Bunting, MD as PCP - General (Internal Medicine) Sanda Klein, MD as PCP - Cardiology (Cardiology) Brodie Correll, Virgie Dad, MD as Consulting Physician (Oncology) Rolm Bookbinder, MD as Consulting Physician (General Surgery) Croitoru, Dani Gobble, MD as Consulting Physician (Cardiology) Netta Cedars, MD as Consulting Physician (Orthopedic Surgery) OTHER MD:  CHIEF COMPLAINT: Estrogen receptor positive invasive lobular breast cancer  CURRENT TREATMENT: Anastrozole   INTERVAL HISTORY: Kathy Murphy was scheduled today for follow-up of her estrogen receptor positive breast cancer.   Kathy Murphy's last bone density screening on 01/16/2018, showed a T-score of -1.6, which is considered osteopenic.      REVIEW OF SYSTEMS: Kathy Murphy    BREAST CANCER HISTORY: From the original intake note:  Kathy Murphy had a change in her right breast and was referred to [facility] where on 04/14/2016 bilateral diagnostic mammography with tomography and right breast ultrasound was obtained. The breast density was category B. In the central right breast there was an oval mass with no other findings of concern. Ultrasound located a benign 0.3 cm cyst in the upper inner quadrant of the right breast correlating with the mammography findings. Routine mammography was recommended for one year.  However with further changes in the right breast note by the patient and her primary care MD, repeat right diagnostic mammography with tomography and repeat right breast ultrasonography 08/21/2016 now found a 3 cm area of asymmetry in the upper outer right breast which on physical exam measured approximately 2-1/2 cm at the 10:00 location 8 cm from the nipple. Ultrasound of this area showed a 0.5 cm hypoechoic mass with a larger 3 cm area of hazy isoechoic tissue with abnormal  architecture. The right axilla showed 2 normal-appearing lymph nodes.  Biopsy of the right breast upper outer quadrant mass 08/28/2016 showed ([id number]) an invasive lobular carcinoma, E-cadherin negative, estrogen receptor 85% positive, progesterone receptor 100% positive, both with strong staining intensity, with an MIB-1 of 5%, and no HER-2 implication, the signals ratio being 1.49 and the number per cell 3.21.  The patient's subsequent history is as detailed below.   PAST MEDICAL HISTORY: Past Medical History:  Diagnosis Date   Arthritis    Breast cancer ([place])    Right   Cancer ([place])    thyroid   GERD (gastroesophageal reflux disease)    GI bleed 1980   AFTER POLYP EXCISION   Heart disease, hypertensive    Hypercholesterolemia    Hypertension    Hypothyroidism    Incontinence of urine    Myalgia    Myocardial infarction ([facility])    Obesity    Pain in joints    Retinal detachment    Unsteady gait    UTI (urinary tract infection)    Vision loss of left eye    Vomiting     PAST SURGICAL HISTORY: Past Surgical History:  Procedure Laterality Date   ABDOMINAL HYSTERECTOMY  1977   BREAST BIOPSY     BREAST LUMPECTOMY     x5   CARDIAC CATHETERIZATION N/A 02/08/2016   Procedure: Left Heart Cath and Coronary Angiography;  Surgeon: Belva Crome, MD;  Location: [place];  Service: Cardiovascular;  Laterality: N/A;   CARDIAC CATHETERIZATION N/A 02/11/2016   Procedure: Coronary Stent Intervention;  Surgeon: Belva Crome, MD;  Location: [place] [facility];  Service: Cardiovascular;  Laterality: N/A;  CATARACT EXTRACTION, BILATERAL     CERVICAL LAMINECTOMY     DENTAL SURGERY  1952   EVACUATION BREAST HEMATOMA Right 01/09/2017   Procedure: EVACUATION RIGHT MASTECTOMY FLUID COLLECTION;  Surgeon: Rolm Bookbinder, MD;  Location: [place];  Service: General;  Laterality: Right;   [place]  16-May-2010   left   TOTAL KNEE ARTHROPLASTY Right 05/18/2017   Procedure: RIGHT TOTAL KNEE ARTHROPLASTY;  Surgeon: Netta Cedars, MD;  Location: [place];  Service: Orthopedics;  Laterality: Right;   TOTAL MASTECTOMY Right 11/27/2016   TOTAL MASTECTOMY Right 11/27/2016   Procedure: RIGHT TOTAL MASTECTOMY;  Surgeon: Rolm Bookbinder, MD;  Location: [place];  Service: General;  Laterality: Right;    FAMILY HISTORY Family History  Problem Relation Age of Onset   Heart disease Mother   The patient has little information regarding her father. Her mother died at age 40 in a nursing home from what the patient thinks may have been heart disease. The patient had no brothers and no sisters.   GYNECOLOGIC HISTORY:  No LMP recorded. Patient is postmenopausal. She thinks her first menstrual period may have been age 41. She never carried a child to term. She underwent hysterectomy with bilateral salpingo-oophorectomy in her 39s. She was on estrogen replacement until July 2018   SOCIAL HISTORY:  She is a retired Glass blower/designer for [employer]. Her husband died in May 15, 2012. She lives by herself, with no pets, in a fairly large house on [place] acres she says. She does much of the house work herself. She also pays all her bills and keeps all her accounts.     ADVANCED DIRECTIVES: In place. She has named her close friend Kathy Murphy as her healthcare power of attorney. He can be reached at (cell) [phone] or (home) [place]: Social History   Tobacco Use   Smoking status: Never Smoker   Smokeless tobacco: Never Used  Vaping Use   Vaping Use: Never used  Substance Use Topics   Alcohol use: No   Drug use: No     Colonoscopy: November 2011/[facility]  PAP: May 16, 2014  Bone density: 10/04/2016 at [facility] showed a T score of -1.4 osteopenic   Allergies  Allergen Reactions   Adhesive [Tape] Other (See Comments)   Other Other (See Comments)    SOME OF THE  "MYCINS"...UNKNOWN REACTION.   Latex Rash    Current Outpatient Medications  Medication Sig Dispense Refill   acetaminophen (TYLENOL) 650 MG CR tablet Take 650 mg by mouth every 8 (eight) hours as needed for pain.     anastrozole (ARIMIDEX) 1 MG tablet Take 1 tablet (1 mg total) by mouth daily. 90 tablet 4   aspirin EC 81 MG tablet Take 81 mg by mouth daily.     Carboxymethylcellulose Sodium (REFRESH TEARS OP) Apply 1 drop to eye daily.     Cholecalciferol (VITAMIN D3) 125 MCG (5000 UT) CAPS Take 1 capsule by mouth daily.     diazepam (VALIUM) 2 MG tablet Take 2 mg by mouth every 6 (six) hours as needed for anxiety.     Diclofenac Sodium CR 100 MG 24 hr tablet Take 100 mg by mouth daily.     levothyroxine (SYNTHROID, LEVOTHROID) 125 MCG tablet Take 125 mcg by mouth daily before breakfast.     losartan (COZAAR) 100 MG tablet Take 100 mg by mouth daily.  metoprolol succinate (TOPROL-XL) 100 MG 24 hr tablet TAKE 1 TABLET DAILY 90 tablet 3   pantoprazole (PROTONIX) 40 MG tablet Take 40 mg by mouth daily.       rosuvastatin (CRESTOR) 10 MG tablet Take 10 mg by mouth every morning.     No current facility-administered medications for this visit.    OBJECTIVE:   There were no vitals filed for this visit.   There is no height or weight on file to calculate BMI.   Wt Readings from Last 3 Encounters:  04/23/19 188 lb 9.6 oz (85.5 kg)  01/01/19 187 lb 1.6 oz (84.9 kg)  04/12/18 184 lb (83.5 kg)      ECOG FS:     LAB RESULTS:  CMP     Component Value Date/Time   NA 140 01/01/2019 1241   NA 140 01/22/2017 0946   K 4.0 01/01/2019 1241   K 4.1 01/22/2017 0946   CL 105 01/01/2019 1241   CO2 25 01/01/2019 1241   CO2 29 01/22/2017 0946   GLUCOSE 140 (H) 01/01/2019 1241   GLUCOSE 101 01/22/2017 0946   BUN 22 01/01/2019 1241   BUN 17.9 01/22/2017 0946   CREATININE 1.01 (H) 01/01/2019 1241   CREATININE 0.8 01/22/2017 0946   CALCIUM 9.3 01/01/2019 1241   CALCIUM 9.1  01/22/2017 0946   PROT 7.0 01/01/2019 1241   PROT 6.5 01/22/2017 0946   ALBUMIN 4.0 01/01/2019 1241   ALBUMIN 3.1 (L) 01/22/2017 0946   AST 12 (L) 01/01/2019 1241   AST 10 01/22/2017 0946   ALT 10 01/01/2019 1241   ALT 12 01/22/2017 0946   ALKPHOS 92 01/01/2019 1241   ALKPHOS 92 01/22/2017 0946   BILITOT 0.7 01/01/2019 1241   BILITOT 0.37 01/22/2017 0946   GFRNONAA 49 (L) 01/01/2019 1241   GFRAA 57 (L) 01/01/2019 1241    No results found for: Ronnald Ramp, A1GS, A2GS, BETS, BETA2SER, GAMS, MSPIKE, SPEI  No results found for: Nils Pyle, [facility]  Lab Results  Component Value Date   WBC 6.7 01/01/2019   NEUTROABS 4.4 01/01/2019   HGB 13.7 01/01/2019   HCT 41.8 01/01/2019   MCV 93.3 01/01/2019   PLT 151 01/01/2019      Chemistry      Component Value Date/Time   NA 140 01/01/2019 1241   NA 140 01/22/2017 0946   K 4.0 01/01/2019 1241   K 4.1 01/22/2017 0946   CL 105 01/01/2019 1241   CO2 25 01/01/2019 1241   CO2 29 01/22/2017 0946   BUN 22 01/01/2019 1241   BUN 17.9 01/22/2017 0946   CREATININE 1.01 (H) 01/01/2019 1241   CREATININE 0.8 01/22/2017 0946      Component Value Date/Time   CALCIUM 9.3 01/01/2019 1241   CALCIUM 9.1 01/22/2017 0946   ALKPHOS 92 01/01/2019 1241   ALKPHOS 92 01/22/2017 0946   AST 12 (L) 01/01/2019 1241   AST 10 01/22/2017 0946   ALT 10 01/01/2019 1241   ALT 12 01/22/2017 0946   BILITOT 0.7 01/01/2019 1241   BILITOT 0.37 01/22/2017 0946       No results found for: LABCA2  No components found for: [id number]  No results for input(s): INR in the last 168 hours.  Urinalysis    Component Value Date/Time   COLORURINE YELLOW 12/26/2012 0843   APPEARANCEUR CLOUDY (A) 12/26/2012 0843   LABSPEC 1.019 12/26/2012 0843   PHURINE 6.0 12/26/2012 0843   GLUCOSEU NEGATIVE 12/26/2012 0843   HGBUR SMALL (A)  12/26/2012 [place] 12/26/2012 [place] 12/26/2012 0843   PROTEINUR  NEGATIVE 12/26/2012 0843   UROBILINOGEN 0.2 12/26/2012 0843   NITRITE NEGATIVE 12/26/2012 0843   LEUKOCYTESUR NEGATIVE 12/26/2012 0843    STUDIES:  No results found.   ELIGIBLE FOR AVAILABLE RESEARCH PROTOCOL: No  ASSESSMENT: 86 y.o. [place], [place] woman status post right breast upper outer quadrant biopsy 08/28/2016 for a clinical T2 N0, stage IB invasive lobular carcinoma, grade not stated, estrogen and progesterone strongly positive, HER-2 nonamplified, with an MIB-1 of 5%.  (1) anastrozole started 09/25/2016  (a) bone density on 10/04/2016 at [facility] showed a T score of -1.4 osteopenic  (b) repeat bone density 01/16/2018 showed a T score of -1.6.  (2) Status post right mastectomy without sentinel lymph node sampling 11/27/2016 for a pT2 cN0, stage IB invasive lobular carcinoma, grade 1, with negative margins.  (3) no adjuvant radiation indicated   PLAN: Kathy Murphy did not show for her 01/05/2020 visit.  A follow-up letter has been sent. Kathy Murphy, Virgie Dad, MD  01/04/20 12:51 PM Medical Oncology and Hematology [facility] [place], [place] [address] Tel. [phone]    Fax. [phone]   I, Wilburn Mylar, am acting as scribe for Dr. Virgie Dad. Nnamdi Dacus.     *Total Encounter Time as defined by the Centers for Medicare and Medicaid Services includes, in addition to the face-to-face time of a patient visit (documented in the note above) non-face-to-face time: obtaining and reviewing outside history, ordering and reviewing medications, tests or procedures, care coordination (communications with other health care professionals or caregivers) and documentation in the medical record.

## 2020-01-05 ENCOUNTER — Inpatient Hospital Stay: Payer: Medicare Other | Attending: Oncology | Admitting: Oncology

## 2020-01-05 ENCOUNTER — Inpatient Hospital Stay: Payer: Medicare Other

## 2020-01-05 ENCOUNTER — Encounter: Payer: Self-pay | Admitting: Oncology

## 2020-01-05 DIAGNOSIS — Z170 Estrogen receptor positive status [ER+]: Secondary | ICD-10-CM

## 2020-01-05 DIAGNOSIS — C50411 Malignant neoplasm of upper-outer quadrant of right female breast: Secondary | ICD-10-CM

## 2020-01-10 DIAGNOSIS — Z23 Encounter for immunization: Secondary | ICD-10-CM | POA: Diagnosis not present

## 2020-02-03 DIAGNOSIS — D1801 Hemangioma of skin and subcutaneous tissue: Secondary | ICD-10-CM | POA: Diagnosis not present

## 2020-02-03 DIAGNOSIS — Z85820 Personal history of malignant melanoma of skin: Secondary | ICD-10-CM | POA: Diagnosis not present

## 2020-02-03 DIAGNOSIS — L821 Other seborrheic keratosis: Secondary | ICD-10-CM | POA: Diagnosis not present

## 2020-02-03 DIAGNOSIS — L820 Inflamed seborrheic keratosis: Secondary | ICD-10-CM | POA: Diagnosis not present

## 2020-02-03 DIAGNOSIS — Z85828 Personal history of other malignant neoplasm of skin: Secondary | ICD-10-CM | POA: Diagnosis not present

## 2020-02-26 DIAGNOSIS — Z1212 Encounter for screening for malignant neoplasm of rectum: Secondary | ICD-10-CM | POA: Diagnosis not present

## 2020-03-20 ENCOUNTER — Other Ambulatory Visit: Payer: Self-pay | Admitting: Oncology

## 2020-04-12 ENCOUNTER — Other Ambulatory Visit: Payer: Self-pay | Admitting: Cardiovascular Disease

## 2020-04-22 DIAGNOSIS — H52201 Unspecified astigmatism, right eye: Secondary | ICD-10-CM | POA: Diagnosis not present

## 2020-04-22 DIAGNOSIS — Z961 Presence of intraocular lens: Secondary | ICD-10-CM | POA: Diagnosis not present

## 2020-04-22 DIAGNOSIS — H26491 Other secondary cataract, right eye: Secondary | ICD-10-CM | POA: Diagnosis not present

## 2020-04-22 DIAGNOSIS — H31002 Unspecified chorioretinal scars, left eye: Secondary | ICD-10-CM | POA: Diagnosis not present

## 2020-05-14 DIAGNOSIS — Z1231 Encounter for screening mammogram for malignant neoplasm of breast: Secondary | ICD-10-CM | POA: Diagnosis not present

## 2020-05-19 DIAGNOSIS — I2510 Atherosclerotic heart disease of native coronary artery without angina pectoris: Secondary | ICD-10-CM | POA: Diagnosis not present

## 2020-05-19 DIAGNOSIS — E039 Hypothyroidism, unspecified: Secondary | ICD-10-CM | POA: Diagnosis not present

## 2020-05-19 DIAGNOSIS — M25561 Pain in right knee: Secondary | ICD-10-CM | POA: Diagnosis not present

## 2020-05-19 DIAGNOSIS — I10 Essential (primary) hypertension: Secondary | ICD-10-CM | POA: Diagnosis not present

## 2020-06-09 ENCOUNTER — Other Ambulatory Visit: Payer: Self-pay

## 2020-06-09 ENCOUNTER — Ambulatory Visit (INDEPENDENT_AMBULATORY_CARE_PROVIDER_SITE_OTHER): Payer: Medicare Other | Admitting: Cardiovascular Disease

## 2020-06-09 ENCOUNTER — Encounter: Payer: Self-pay | Admitting: Cardiovascular Disease

## 2020-06-09 VITALS — BP 124/72 | HR 71 | Ht 68.0 in | Wt 184.0 lb

## 2020-06-09 DIAGNOSIS — E7800 Pure hypercholesterolemia, unspecified: Secondary | ICD-10-CM | POA: Diagnosis not present

## 2020-06-09 DIAGNOSIS — R0602 Shortness of breath: Secondary | ICD-10-CM

## 2020-06-09 DIAGNOSIS — I10 Essential (primary) hypertension: Secondary | ICD-10-CM | POA: Diagnosis not present

## 2020-06-09 DIAGNOSIS — E663 Overweight: Secondary | ICD-10-CM | POA: Diagnosis not present

## 2020-06-09 DIAGNOSIS — I5042 Chronic combined systolic (congestive) and diastolic (congestive) heart failure: Secondary | ICD-10-CM

## 2020-06-09 DIAGNOSIS — I2510 Atherosclerotic heart disease of native coronary artery without angina pectoris: Secondary | ICD-10-CM | POA: Diagnosis not present

## 2020-06-09 NOTE — Progress Notes (Signed)
Cardiology Office Note    Date:  06/10/2020   ID:  Kathy Murphy, DOB Jun 14, 1927, MRN [id number]  PCP:  Burnard Bunting, MD  Cardiologist:   Sanda Klein, MD   Chief Complaint  Patient presents with  . Shortness of Breath     History of Present Illness:  Kathy Murphy is a 87 y.o. female returning in follow-up roughly 4.5 years after placement of 2 overlapping drug-eluting stents in the subtotally occluded right coronary artery (2.5 x 38 mm Synergy). She also had high-grade stenoses in the diagonal artery, ramus intermedius branch, distal LAD which were left for medical therapy. Presence of widespread coronary lesions may explain the "false negative" perfusion pattern but with depressed left ventricular systolic function (70%). Upper limit of normal LVEDP at cath.   Recently she has developed some problems with shortness of breath with activity and lower extremity edema.  It has been going on for 2 or 3 months.  She denies orthopnea or PND, becomes short of breath walking through the house (she has to use a walker due to right hip pain and poor gait stability).  She has a smart watch with an optical sensor that has not shown irregular rhythms but does show faster than usual heart rates with activity.  She does not have palpitations and denies dizziness, syncope or focal neurological complaints.  She denies claudication.  She has not had any chest discomfort either at rest or with activity.  She has noticed short-term memory problems and wonders whether she has cognitive issues.  One of her biggest complaints is very serious problems with urinary frequency and poor continence.  She spends a lot of money on adult diaper supplies.  In September 2018 she underwent lumpectomy for cancer of the right breast.  She did not receive radiation or chemotherapy.    She had right knee replacement in 2019.  She had a skin cancer removed from her left shoulder in December 2019.   Past Medical History:   Diagnosis Date  . Arthritis   . Breast cancer ([place])    Right  . Cancer ([place])    thyroid  . GERD (gastroesophageal reflux disease)   . GI bleed 1980   AFTER POLYP EXCISION  . Heart disease, hypertensive   . Hypercholesterolemia   . Hypertension   . Hypothyroidism   . Incontinence of urine   . Myalgia   . Myocardial infarction ([place])   . Obesity   . Pain in joints   . Retinal detachment   . Unsteady gait   . UTI (urinary tract infection)   . Vision loss of left eye   . Vomiting     Past Surgical History:  Procedure Laterality Date  . ABDOMINAL HYSTERECTOMY  1977  . BREAST BIOPSY    . BREAST LUMPECTOMY     x5  . CARDIAC CATHETERIZATION N/A 02/08/2016   Procedure: Left Heart Cath and Coronary Angiography;  Surgeon: Belva Crome, MD;  Location: [facility];  Service: Cardiovascular;  Laterality: N/A;  . CARDIAC CATHETERIZATION N/A 02/11/2016   Procedure: Coronary Stent Intervention;  Surgeon: Belva Crome, MD;  Location: [facility];  Service: Cardiovascular;  Laterality: N/A;  . CATARACT EXTRACTION, BILATERAL    . CERVICAL LAMINECTOMY    . [place]  . EVACUATION BREAST HEMATOMA Right 01/09/2017   Procedure: EVACUATION RIGHT MASTECTOMY FLUID COLLECTION;  Surgeon: Rolm Bookbinder, MD;  Location: [facility];  Service: General;  Laterality:  Right;  Marland Kitchen FOOT SURGERY    . NECK SURGERY    . THYROIDECTOMY  1957  . TOTAL HIP ARTHROPLASTY  2012   left  . TOTAL KNEE ARTHROPLASTY Right 05/18/2017   Procedure: RIGHT TOTAL KNEE ARTHROPLASTY;  Surgeon: Netta Cedars, MD;  Location: [facility];  Service: Orthopedics;  Laterality: Right;  . TOTAL MASTECTOMY Right 11/27/2016  . TOTAL MASTECTOMY Right 11/27/2016   Procedure: RIGHT TOTAL MASTECTOMY;  Surgeon: Rolm Bookbinder, MD;  Location: [facility];  Service: General;  Laterality: Right;    Current Medications: Outpatient Medications Prior to Visit  Medication Sig Dispense Refill  . acetaminophen (TYLENOL) 650 MG CR  tablet Take 650 mg by mouth every 8 (eight) hours as needed for pain.    Marland Kitchen anastrozole (ARIMIDEX) 1 MG tablet TAKE 1 TABLET DAILY 90 tablet 3  . aspirin EC 81 MG tablet Take 81 mg by mouth daily.    . Carboxymethylcellulose Sodium (REFRESH TEARS OP) Apply 1 drop to eye daily.    . Cholecalciferol (VITAMIN D3) 125 MCG (5000 UT) CAPS Take 1 capsule by mouth daily.    . diazepam (VALIUM) 2 MG tablet Take 2 mg by mouth every 6 (six) hours as needed for anxiety.    . Diclofenac Sodium CR 100 MG 24 hr tablet Take 100 mg by mouth daily.    Marland Kitchen levothyroxine (SYNTHROID, LEVOTHROID) 125 MCG tablet Take 125 mcg by mouth daily before breakfast.    . losartan (COZAAR) 100 MG tablet Take 100 mg by mouth daily.     . metoprolol succinate (TOPROL-XL) 100 MG 24 hr tablet TAKE 1 TABLET DAILY 90 tablet 3  . pantoprazole (PROTONIX) 40 MG tablet Take 40 mg by mouth daily.    . rosuvastatin (CRESTOR) 10 MG tablet Take 10 mg by mouth every morning.    Marland Kitchen acetaminophen (TYLENOL) 650 MG CR tablet Take 650 mg by mouth every 8 (eight) hours as needed for pain.     No facility-administered medications prior to visit.     Allergies:   Adhesive [tape], Other, and Latex   Social History   Socioeconomic History  . Marital status: Widowed    Spouse name: Not on file  . Number of children: Not on file  . Years of education: Not on file  . Highest education level: Not on file  Occupational History  . Not on file  Tobacco Use  . Smoking status: Never Smoker  . Smokeless tobacco: Never Used  Vaping Use  . Vaping Use: Never used  Substance and Sexual Activity  . Alcohol use: No  . Drug use: No  . Sexual activity: Not on file  Other Topics Concern  . Not on file  Social History Narrative  . Not on file   Social Determinants of Health   Financial Resource Strain: Not on file  Food Insecurity: Not on file  Transportation Needs: Not on file  Physical Activity: Not on file  Stress: Not on file  Social  Connections: Not on file     Family History:  The patient's family history includes Heart disease in her mother.   ROS:   Please see the history of present illness.    ROS All other systems are reviewed and are negative.   PHYSICAL EXAM:   VS:  BP 124/72 (BP Location: Left Arm, Patient Position: Sitting)   Pulse 71   Ht 5\' 8"  (1.727 m)   Wt 184 lb (83.5 kg)   SpO2 95%   BMI  27.98 kg/m      General: Alert, oriented x3, no distress, mildly overweight. Head: no evidence of trauma, PERRL, EOMI, no exophtalmos or lid lag, no myxedema, no xanthelasma; normal ears, nose and oropharynx Neck: normal jugular venous pulsations and no hepatojugular reflux; brisk carotid pulses without delay and no carotid bruits Chest: clear to auscultation, no signs of consolidation by percussion or palpation, normal fremitus, symmetrical and full respiratory excursions Cardiovascular: normal position and quality of the apical impulse, regular rhythm, normal first and second heart sounds, no murmurs, rubs or gallops Abdomen: no tenderness or distention, no masses by palpation, no abnormal pulsatility or arterial bruits, normal bowel sounds, no hepatosplenomegaly Extremities: no clubbing, cyanosis , trivial bilateral ankle edema; 2+ radial, ulnar and brachial pulses bilaterally; 2+ right femoral, posterior tibial and dorsalis pedis pulses; 2+ left femoral, posterior tibial and dorsalis pedis pulses; no subclavian or femoral bruits Neurological: grossly nonfocal.  She uses a walker and favors her left side, but is moving quite briskly and does not appear to have any respiratory difficulty walking through the clinic. Psych: Normal mood and affect  Wt Readings from Last 3 Encounters:  06/09/20 184 lb (83.5 kg)  04/23/19 188 lb 9.6 oz (85.5 kg)  01/01/19 187 lb 1.6 oz (84.9 kg)    Studies/Labs Reviewed:   EKG:  EKG is ordered today.  It order sinus rhythm QS pattern in leads V1 V2 (old), LVH with secondary  repolarization abnormalities, QTC 439 ms.  It is really not changed from previous tracings.   Labs: Lipid Panel     Component Value Date/Time   CHOL 116 04/16/2017 1037   TRIG 70 04/16/2017 1037   HDL 45 04/16/2017 1037   CHOLHDL 2.6 04/16/2017 1037   CHOLHDL 2.6 04/10/2016 0857   VLDL 13 04/10/2016 0857   LDLCALC 57 04/16/2017 1037   10/31/2018 Total cholesterol 116, HDL 38, LDL 66, triglycerides 59 Hemoglobin 13.7 on 01/01/2019  11/04/2019 Cholesterol 119, HDL 43, LDL 61, triglycerides 75 Potassium 3.9, normal liver function tests, TSH 0.38  BMET    Component Value Date/Time   NA 140 01/01/2019 1241   NA 140 01/22/2017 0946   K 4.0 01/01/2019 1241   K 4.1 01/22/2017 0946   CL 105 01/01/2019 1241   CO2 25 01/01/2019 1241   CO2 29 01/22/2017 0946   GLUCOSE 140 (H) 01/01/2019 1241   GLUCOSE 101 01/22/2017 0946   BUN 22 01/01/2019 1241   BUN 17.9 01/22/2017 0946   CREATININE 1.01 (H) 01/01/2019 1241   CREATININE 0.8 01/22/2017 0946   CALCIUM 9.3 01/01/2019 1241   CALCIUM 9.1 01/22/2017 0946   GFRNONAA 49 (L) 01/01/2019 1241   GFRAA 57 (L) 01/01/2019 1241     ASSESSMENT:    1. Shortness of breath   2. CHF (congestive heart failure), NYHA class I, chronic, combined ([place])   3. Coronary artery disease involving native coronary artery of native heart without angina pectoris   4. Hypercholesterolemia   5. Essential hypertension   6. Overweight (BMI 25.0-29.9)      PLAN:  In order of problems listed above:  1. CHF: Previously asymptomatic but now has developed shortness of breath with household activities.  Does not appear to be in any distress whatsoever today and not grossly volume overloaded on exam.  She has less than perfect compliance with sodium restriction; we will try to improve this first before we add diuretics..  She does not monitor her weight regularly, but weighs less today than  she has in the past despite reports of edema (on my exam there is actually  very little swelling).  Mildly depressed left ventricular systolic function with EF 40-45% in the past, will repeat her echocardiogram on ARB and beta-blocker therapy.  We will add low-dose loop diuretic therapy if the echo confirms findings consistent with elevated filling pressures. 2. CAD: She does not have angina pectoris.  On aspirin and statin.  Important to remember that her nuclear stress test did not show evidence of ischemia (possibly due to some degree of balanced defects?). 3. HLP: Excellent lipid profile on current medications. 4. HTN: Well-controlled. 5. Overweight: She actually weighs less than she did at her last appointment.  Medication Adjustments/Labs and Tests Ordered: Current medicines are reviewed at length with the patient today.  Concerns regarding medicines are outlined above.  Medication changes, Labs and Tests ordered today are listed in the Patient Instructions below. Patient Instructions  Medication Instructions:  No changes *If you need a refill on your cardiac medications before your next appointment, please call your pharmacy*   Lab Work: None ordered If you have labs (blood work) drawn today and your tests are completely normal, you will receive your results only by: Marland Kitchen MyChart Message (if you have MyChart) OR . A paper copy in the mail If you have any lab test that is abnormal or we need to change your treatment, we will call you to review the results.   Testing/Procedures: Your physician has requested that you have an echocardiogram. Echocardiography is a painless test that uses sound waves to create images of your heart. It provides your doctor with information about the size and shape of your heart and how well your heart's chambers and valves are working. You may receive an ultrasound enhancing agent through an IV if needed to better visualize your heart during the echo.This procedure takes approximately one hour. There are no restrictions for this  procedure. This will take place at the [address], [address].     Follow-Up: At [facility], you and your health needs are our priority.  As part of our continuing mission to provide you with exceptional heart care, we have created designated Provider Care Teams.  These Care Teams include your primary Cardiologist (physician) and Advanced Practice Providers (APPs -  Physician Assistants and Nurse Practitioners) who all work together to provide you with the care you need, when you need it.  We recommend signing up for the patient portal called "MyChart".  Sign up information is provided on this After Visit Summary.  MyChart is used to connect with patients for Virtual Visits (Telemedicine).  Patients are able to view lab/test results, encounter notes, upcoming appointments, etc.  Non-urgent messages can be sent to your provider as well.   To learn more about what you can do with MyChart, go to [email or web address].    Your next appointment:   12 month(s)  The format for your next appointment:   In Person  Provider:   You may see Sanda Klein, MD or one of the following Advanced Practice Providers on your designated Care Team:    Almyra Deforest, PA-C  Fabian Sharp, [place] or   Roby Lofts, PA-C      Signed, Sanda Klein, MD  06/10/2020 4:47 PM    [facility] [place], [place], [place]  [address] Phone: [phone]; Fax: [phone]

## 2020-06-09 NOTE — Patient Instructions (Signed)
Medication Instructions:  No changes *If you need a refill on your cardiac medications before your next appointment, please call your pharmacy*   Lab Work: None ordered If you have labs (blood work) drawn today and your tests are completely normal, you will receive your results only by: Marland Kitchen MyChart Message (if you have MyChart) OR . A paper copy in the mail If you have any lab test that is abnormal or we need to change your treatment, we will call you to review the results.   Testing/Procedures: Your physician has requested that you have an echocardiogram. Echocardiography is a painless test that uses sound waves to create images of your heart. It provides your doctor with information about the size and shape of your heart and how well your heart's chambers and valves are working. You may receive an ultrasound enhancing agent through an IV if needed to better visualize your heart during the echo.This procedure takes approximately one hour. There are no restrictions for this procedure. This will take place at the [address].     Follow-Up: At [facility], you and your health needs are our priority.  As part of our continuing mission to provide you with exceptional heart care, we have created designated Provider Care Teams.  These Care Teams include your primary Cardiologist (physician) and Advanced Practice Providers (APPs -  Physician Assistants and Nurse Practitioners) who all work together to provide you with the care you need, when you need it.  We recommend signing up for the patient portal called "MyChart".  Sign up information is provided on this After Visit Summary.  MyChart is used to connect with patients for Virtual Visits (Telemedicine).  Patients are able to view lab/test results, encounter notes, upcoming appointments, etc.  Non-urgent messages can be sent to your provider as well.   To learn more about what you can do with MyChart, go to [email or web address].     Your next appointment:   12 month(s)  The format for your next appointment:   In Person  Provider:   You may see Sanda Klein, MD or one of the following Advanced Practice Providers on your designated Care Team:    Almyra Deforest, PA-C  Fabian Sharp, PA-C or   Roby Lofts, [place]

## 2020-06-10 ENCOUNTER — Encounter: Payer: Self-pay | Admitting: Cardiovascular Disease

## 2020-07-01 ENCOUNTER — Emergency Department (HOSPITAL_BASED_OUTPATIENT_CLINIC_OR_DEPARTMENT_OTHER)
Admission: EM | Admit: 2020-07-01 | Discharge: 2020-07-02 | Disposition: A | Discharge status: Home / Self Care | Payer: Medicare Other | Attending: Emergency Medicine | Admitting: Emergency Medicine

## 2020-07-01 ENCOUNTER — Encounter (HOSPITAL_BASED_OUTPATIENT_CLINIC_OR_DEPARTMENT_OTHER): Payer: Self-pay

## 2020-07-01 ENCOUNTER — Other Ambulatory Visit: Payer: Self-pay

## 2020-07-01 DIAGNOSIS — Z96642 Presence of left artificial hip joint: Secondary | ICD-10-CM | POA: Insufficient documentation

## 2020-07-01 DIAGNOSIS — M542 Cervicalgia: Secondary | ICD-10-CM | POA: Insufficient documentation

## 2020-07-01 DIAGNOSIS — R5383 Other fatigue: Secondary | ICD-10-CM | POA: Insufficient documentation

## 2020-07-01 DIAGNOSIS — R519 Headache, unspecified: Secondary | ICD-10-CM | POA: Diagnosis not present

## 2020-07-01 DIAGNOSIS — Z79899 Other long term (current) drug therapy: Secondary | ICD-10-CM | POA: Insufficient documentation

## 2020-07-01 DIAGNOSIS — E039 Hypothyroidism, unspecified: Secondary | ICD-10-CM | POA: Diagnosis not present

## 2020-07-01 DIAGNOSIS — Z91040 Latex allergy status: Secondary | ICD-10-CM | POA: Insufficient documentation

## 2020-07-01 DIAGNOSIS — I110 Hypertensive heart disease with heart failure: Secondary | ICD-10-CM | POA: Insufficient documentation

## 2020-07-01 DIAGNOSIS — I25110 Atherosclerotic heart disease of native coronary artery with unstable angina pectoris: Secondary | ICD-10-CM | POA: Insufficient documentation

## 2020-07-01 DIAGNOSIS — Z85850 Personal history of malignant neoplasm of thyroid: Secondary | ICD-10-CM | POA: Insufficient documentation

## 2020-07-01 DIAGNOSIS — Z96651 Presence of right artificial knee joint: Secondary | ICD-10-CM | POA: Insufficient documentation

## 2020-07-01 DIAGNOSIS — Z7982 Long term (current) use of aspirin: Secondary | ICD-10-CM | POA: Insufficient documentation

## 2020-07-01 DIAGNOSIS — I5042 Chronic combined systolic (congestive) and diastolic (congestive) heart failure: Secondary | ICD-10-CM | POA: Diagnosis not present

## 2020-07-01 DIAGNOSIS — N390 Urinary tract infection, site not specified: Secondary | ICD-10-CM

## 2020-07-01 DIAGNOSIS — H5500 Unspecified nystagmus: Secondary | ICD-10-CM | POA: Diagnosis not present

## 2020-07-01 DIAGNOSIS — R42 Dizziness and giddiness: Secondary | ICD-10-CM | POA: Insufficient documentation

## 2020-07-01 DIAGNOSIS — Z853 Personal history of malignant neoplasm of breast: Secondary | ICD-10-CM | POA: Insufficient documentation

## 2020-07-01 LAB — BASIC METABOLIC PANEL
Anion gap: 9 (ref 5–15)
BUN: 25 mg/dL — ABNORMAL HIGH (ref 8–23)
CO2: 27 mmol/L (ref 22–32)
Calcium: 9.8 mg/dL (ref 8.9–10.3)
Chloride: 105 mmol/L (ref 98–111)
Creatinine, Ser: 0.89 mg/dL (ref 0.44–1.00)
GFR, Estimated: >60 mL/min (ref >60)
Glucose, Bld: 135 mg/dL — ABNORMAL HIGH (ref 70–99)
Potassium: 3.4 mmol/L — ABNORMAL LOW (ref 3.5–5.1)
Sodium: 141 mmol/L (ref 135–145)

## 2020-07-01 LAB — CBC
HCT: 46.6 % — ABNORMAL HIGH (ref 36.0–46.0)
Hemoglobin: 14.9 g/dL (ref 12.0–15.0)
MCH: 30.7 pg (ref 26.0–34.0)
MCHC: 32 g/dL (ref 30.0–36.0)
MCV: 96.1 fL (ref 80.0–100.0)
Platelets: 162 10*3/uL (ref 150–400)
RBC: 4.85 MIL/uL (ref 3.87–5.11)
RDW: 13.1 % (ref 11.5–15.5)
WBC: 8.1 10*3/uL (ref 4.0–10.5)
nRBC: 0 % (ref 0.0–0.2)

## 2020-07-01 LAB — URINALYSIS, ROUTINE W REFLEX MICROSCOPIC
Bilirubin Urine: NEGATIVE
Glucose, UA: NEGATIVE mg/dL
Ketones, ur: NEGATIVE mg/dL
Nitrite: POSITIVE — AB
Specific Gravity, Urine: 1.024 (ref 1.005–1.030)
pH: 5.5 (ref 5.0–8.0)

## 2020-07-01 LAB — CBG MONITORING, ED: Glucose-Capillary: 129 mg/dL — ABNORMAL HIGH (ref 70–99)

## 2020-07-01 MED ORDER — MECLIZINE HCL 25 MG PO TABS
25.0000 mg | ORAL_TABLET | Freq: Once | ORAL | Status: AC
  Administered 2020-07-01: 25 mg via ORAL
  Filled 2020-07-01: qty 1

## 2020-07-01 MED ORDER — SODIUM CHLORIDE 0.9 % IV BOLUS
1000.0000 mL | Freq: Once | INTRAVENOUS | Status: AC
  Administered 2020-07-02: 1000 mL via INTRAVENOUS

## 2020-07-01 MED ORDER — SODIUM CHLORIDE 0.9 % IV SOLN
1.0000 g | Freq: Once | INTRAVENOUS | Status: AC
  Administered 2020-07-02: 1 g via INTRAVENOUS
  Filled 2020-07-01: qty 10

## 2020-07-01 NOTE — ED Provider Notes (Signed)
[facility] EMERGENCY DEPT Provider Note   CSN: [id number] Arrival date & time: 07/01/20  2008     History Chief Complaint  Patient presents with  . Dizziness    Kathy Murphy is a 87 y.o. female.  The history is provided by the patient, a relative and medical records. No language interpreter was used.  Dizziness Quality:  Head spinning, room spinning and imbalance Severity:  Moderate Onset quality:  Gradual Duration:  5 days Timing:  Constant Progression:  Waxing and waning Chronicity:  New Relieved by:  Nothing Worsened by:  Nothing Ineffective treatments:  None tried Associated symptoms: headaches   Associated symptoms: no chest pain, no diarrhea, no nausea, no palpitations, no shortness of breath, no syncope, no vision changes, no vomiting and no weakness   Risk factors: hx of vertigo        Past Medical History:  Diagnosis Date  . Arthritis   . Breast cancer ([place])    Right  . Cancer ([place])    thyroid  . GERD (gastroesophageal reflux disease)   . GI bleed 1980   AFTER POLYP EXCISION  . Heart disease, hypertensive   . Hypercholesterolemia   . Hypertension   . Hypothyroidism   . Incontinence of urine   . Myalgia   . Myocardial infarction ([place])   . Obesity   . Pain in joints   . Retinal detachment   . Unsteady gait   . UTI (urinary tract infection)   . Vision loss of left eye   . Vomiting     Patient Active Problem List   Diagnosis Date Noted  . Status post total knee replacement, right 05/18/2017  . Dyslipidemia 04/05/2017  . Obesity   . Myocardial infarction ([place])   . Incontinence of urine   . Hypertension   . Seroma of breast 01/08/2017  . Malignant neoplasm of upper-outer quadrant of right breast in female, estrogen receptor positive ([place]) 08/31/2016  . CHF (congestive heart failure), NYHA class I, chronic, combined ([place]) 04/25/2016  . Essential hypertension 04/25/2016  . CAD (coronary artery disease), native coronary artery  02/11/2016  . Chronic total occlusion of coronary artery   . Coronary artery disease   . Unstable angina pectoris ([place])   . Chest pain 02/07/2016  . Abnormal nuclear cardiac imaging test 02/07/2016  . Nipple discharge in female 04/17/2011  . Heart disease, hypertensive   . Hypercholesterolemia   . Arthritis   . Unsteady gait   . Myalgia   . Pain in joints   . Retinal detachment   . Hypothyroidism   . GERD (gastroesophageal reflux disease)   . UTI (urinary tract infection)   . Vomiting   . Overweight (BMI 25.0-29.9)   . GI bleed     Past Surgical History:  Procedure Laterality Date  . ABDOMINAL HYSTERECTOMY  1977  . BREAST BIOPSY    . BREAST LUMPECTOMY     x5  . CARDIAC CATHETERIZATION N/A 02/08/2016   Procedure: Left Heart Cath and Coronary Angiography;  Surgeon: Belva Crome, MD;  Location: [facility];  Service: Cardiovascular;  Laterality: N/A;  . CARDIAC CATHETERIZATION N/A 02/11/2016   Procedure: Coronary Stent Intervention;  Surgeon: Belva Crome, MD;  Location: [facility];  Service: Cardiovascular;  Laterality: N/A;  . CATARACT EXTRACTION, BILATERAL    . CERVICAL LAMINECTOMY    . [place]  . EVACUATION BREAST HEMATOMA Right 01/09/2017   Procedure: EVACUATION RIGHT MASTECTOMY FLUID COLLECTION;  Surgeon: Rolm Bookbinder, MD;  Location: [facility];  Service: General;  Laterality: Right;  . FOOT SURGERY    . NECK SURGERY    . THYROIDECTOMY  1957  . TOTAL HIP ARTHROPLASTY  2012   left  . TOTAL KNEE ARTHROPLASTY Right 05/18/2017   Procedure: RIGHT TOTAL KNEE ARTHROPLASTY;  Surgeon: Netta Cedars, MD;  Location: [facility];  Service: Orthopedics;  Laterality: Right;  . TOTAL MASTECTOMY Right 11/27/2016  . TOTAL MASTECTOMY Right 11/27/2016   Procedure: RIGHT TOTAL MASTECTOMY;  Surgeon: Rolm Bookbinder, MD;  Location: [facility];  Service: General;  Laterality: Right;     OB History   No obstetric history on file.     Family History  Problem  Relation Age of Onset  . Heart disease Mother     Social History   Tobacco Use  . Smoking status: Never Smoker  . Smokeless tobacco: Never Used  Vaping Use  . Vaping Use: Never used  Substance Use Topics  . Alcohol use: No  . Drug use: No    Home Medications Prior to Admission medications   Medication Sig Start Date End Date Taking? Authorizing Provider  acetaminophen (TYLENOL) 650 MG CR tablet Take 650 mg by mouth every 8 (eight) hours as needed for pain.    [provider]  anastrozole (ARIMIDEX) 1 MG tablet TAKE 1 TABLET DAILY 03/22/20   Magrinat, Virgie Dad, MD  aspirin EC 81 MG tablet Take 81 mg by mouth daily.    [provider]  Carboxymethylcellulose Sodium (REFRESH TEARS OP) Apply 1 drop to eye daily.    [provider]  Cholecalciferol (VITAMIN D3) 125 MCG (5000 UT) CAPS Take 1 capsule by mouth daily.    [provider]  diazepam (VALIUM) 2 MG tablet Take 2 mg by mouth every 6 (six) hours as needed for anxiety.    [provider]  Diclofenac Sodium CR 100 MG 24 hr tablet Take 100 mg by mouth daily. 09/02/16   [provider]  levothyroxine (SYNTHROID, LEVOTHROID) 125 MCG tablet Take 125 mcg by mouth daily before breakfast.    [provider]  losartan (COZAAR) 100 MG tablet Take 100 mg by mouth daily.  03/30/11   [provider]  metoprolol succinate (TOPROL-XL) 100 MG 24 hr tablet TAKE 1 TABLET DAILY 04/13/20   Croitoru, Mihai, MD  pantoprazole (PROTONIX) 40 MG tablet Take 40 mg by mouth daily.    [provider]  rosuvastatin (CRESTOR) 10 MG tablet Take 10 mg by mouth every morning.    [provider]    Allergies    Adhesive [tape], Other, and Latex  Review of Systems   Review of Systems  Constitutional: Positive for fatigue. Negative for chills and fever.  HENT: Negative for congestion.   Eyes: Negative for visual disturbance.  Respiratory: Negative for cough, chest tightness and  shortness of breath.   Cardiovascular: Negative for chest pain, palpitations and syncope.  Gastrointestinal: Negative for abdominal pain, diarrhea, nausea and vomiting.  Genitourinary: Negative for dysuria and flank pain.  Musculoskeletal: Positive for neck pain. Negative for back pain and neck stiffness.  Neurological: Positive for dizziness, light-headedness and headaches. Negative for seizures, syncope, facial asymmetry, speech difficulty, weakness and numbness.  Psychiatric/Behavioral: Negative for agitation and confusion.  All other systems reviewed and are negative.   Physical Exam Updated Vital Signs BP 136/62   Pulse 63   Temp 97.7 F (36.5 C) (Oral)   Resp 18   Ht 5'  9" (1.753 m)   Wt 83.5 kg   SpO2 97%   BMI 27.17 kg/m   Physical Exam Vitals and nursing note reviewed.  Constitutional:      General: She is not in acute distress.    Appearance: She is well-developed. She is not ill-appearing, toxic-appearing or diaphoretic.  HENT:     Head: Normocephalic and atraumatic.     Nose: No congestion or rhinorrhea.     Mouth/Throat:     Mouth: Mucous membranes are dry.     Pharynx: No oropharyngeal exudate or posterior oropharyngeal erythema.  Eyes:     Extraocular Movements:     Right eye: Nystagmus present.     Left eye: Nystagmus present.     Conjunctiva/sclera: Conjunctivae normal.     Comments: Some nystagmus when looking to the right.  Left pupil is more sluggish on the right which patient reports is chronic as she has poor vision in left eye.  Neck:     Vascular: No carotid bruit.  Cardiovascular:     Rate and Rhythm: Normal rate and regular rhythm.     Heart sounds: No murmur heard.   Pulmonary:     Effort: Pulmonary effort is normal. No respiratory distress.     Breath sounds: Normal breath sounds.  Abdominal:     Palpations: Abdomen is soft.     Tenderness: There is no abdominal tenderness. There is no right CVA tenderness, left CVA tenderness, guarding  or rebound.  Musculoskeletal:     Cervical back: Neck supple. No rigidity or tenderness.  Skin:    General: Skin is warm and dry.     Capillary Refill: Capillary refill takes less than 2 seconds.  Neurological:     Mental Status: She is alert.     Cranial Nerves: No dysarthria or facial asymmetry.     Sensory: No sensory deficit.     Motor: No weakness, tremor, abnormal muscle tone or seizure activity.     Coordination: Finger-Nose-Finger Test normal.     Comments: Gait deferred initially due to dizziness reported.  Psychiatric:        Mood and Affect: Mood normal.     ED Results / Procedures / Treatments   Labs (all labs ordered are listed, but only abnormal results are displayed) Labs Reviewed  BASIC METABOLIC PANEL - Abnormal; Notable for the following components:      Result Value   Potassium 3.4 (*)    Glucose, Bld 135 (*)    BUN 25 (*)    All other components within normal limits  CBC - Abnormal; Notable for the following components:   HCT 46.6 (*)    All other components within normal limits  URINALYSIS, ROUTINE W REFLEX MICROSCOPIC - Abnormal; Notable for the following components:   Hgb urine dipstick SMALL (*)    Protein, ur TRACE (*)    Nitrite POSITIVE (*)    Leukocytes,Ua MODERATE (*)    Bacteria, UA MANY (*)    All other components within normal limits  CBG MONITORING, ED - Abnormal; Notable for the following components:   Glucose-Capillary 129 (*)    All other components within normal limits  URINE CULTURE    EKG EKG Interpretation  Date/Time:  Thursday July 01 2020 23:18:55 EDT Ventricular Rate:  61 PR Interval:  271 QRS Duration: 110 QT Interval:  430 QTC Calculation: 434 R Axis:   15 Text Interpretation: Sinus rhythm Prolonged PR interval LVH with secondary repolarization abnormality Anterior Q waves,  possibly due to LVH When compared to prior, overall similar appearance. No STEMI Confirmed by Antony Blackbird [phone]) on 07/01/2020 11:34:51  PM   Radiology No results found.  Procedures Procedures   Medications Ordered in ED Medications  sodium chloride 0.9 % bolus 1,000 mL (has no administration in time range)  cefTRIAXone (ROCEPHIN) 1 g in sodium chloride 0.9 % 100 mL IVPB (has no administration in time range)  meclizine (ANTIVERT) tablet 25 mg (25 mg Oral Given 07/01/20 2311)    ED Course  I have reviewed the triage vital signs and the nursing notes.  Pertinent labs & imaging results that were available during my care of the patient were reviewed by me and considered in my medical decision making (see chart for details).    MDM Rules/Calculators/A&P                          PRIM MORACE is a 87 y.o. female with a past medical history significant for hypertension, hypothyroidism, thyroid cancer, breast cancer, CAD with prior MI, GERD, hypercholesterolemia, left eye vision loss at baseline, prior GI bleed, and report of remote vertigo who presents with 5 days of pain in her left neck going into her head, headache, dizziness, and gait abnormality.  Patient reports that she has had dizziness feeling that either head or the world is spinning at times.  She has felt occasionally lightheaded.  She reports that years ago she had vertigo but has not had symptoms in quite some time.  She is unsure if it feels like that.  She reports no recent neck manipulation, chiropractor use, or neck massage.  She reports the pain is going from her neck into her head and denies any history of vertebral artery dissection or other neck problems.  She reports no new vision changes as she chronically has vision difficulties and is scheduled to see her ophthalmologist in the next week or 2.  She denies any nausea or vomiting.  She denies any new numbness, tingling, or weakness of extremities.  She reports she has had some difficulty with ambulation when she had vertigo in the past but has been doing well recently.  This week her symptoms have worsened  including stay.  On exam, lungs clear and chest nontender.  Abdomen nontender.  Normal sensation and strength in extremities normal finger-nose-finger testing bilaterally.  Pupils are symmetric and right is more reactive than left which patient reports is at baseline.  She does have some nystagmus looking to the right.  Normal finger-nose-finger testing.  Normal exam otherwise.  No carotid bruit appreciated.  Given patient's report of symptoms I am somewhat concerned we need to rule out acute vertebral artery dissection or intracranial abnormality.  I suspect that patient may have some other abnormality such as UTI, dehydration, or electrolyte imbalance causing recurrent vertigo.  We will give her some fluids and some meclizine to see if this helps.  We will get urinalysis and labs.  We will get the CTA of the head neck to look for intracranial abnormality.  If symptoms improve and imaging is reassuring, anticipate discharge home to follow-up PCP however, if there is evidence of dissection, stroke, or symptoms do not improve with fluids and medication, patient may require transfer for MRI to rule out acute stroke as cause of her dizziness and unsteadiness.  Care transferred to oncoming team awaiting for results of work-up.  Anticipate reassessment after work-up is completed.  Final Clinical Impression(s) / ED Diagnoses Final diagnoses:  Fatigue, unspecified type  Dizziness  Neck pain  Acute nonintractable headache, unspecified headache type    Clinical Impression: 1. Fatigue, unspecified type   2. Dizziness   3. Neck pain   4. Acute nonintractable headache, unspecified headache type     Disposition: Care transferred to oncoming team awaiting for results of work-up.  Anticipate reassessment after work-up is completed.  This note was prepared with assistance of Systems analyst. Occasional wrong-word or sound-a-like substitutions may have occurred due to the inherent  limitations of voice recognition software.      Desia Saban, Gwenyth Allegra, MD 07/02/20 [phone]

## 2020-07-01 NOTE — ED Notes (Signed)
This RN attempted to insert a PIV twice with no success.

## 2020-07-01 NOTE — ED Provider Notes (Incomplete)
I assumed care of this patient.  Please see previous provider note for further details of Hx, PE.  Briefly patient is a 87 y.o. female who presented here for dizziness. Work up thus far is notable for UTI.  No significant electrolyte derangements or renal insufficiency.  Mild hyperglycemia without evidence of DKA.  No anemia.  No leukocytosis.  Currently pending CTA head and neck to rule out vascular process.  Will follow up imaging and ***

## 2020-07-01 NOTE — ED Provider Notes (Signed)
I assumed care of this patient.  Please see previous provider note for further details of Hx, PE.  Briefly patient is a 87 y.o. female who presented here for dizziness. Work up thus far is notable for UTI.  No significant electrolyte derangements or renal insufficiency.  Mild hyperglycemia without evidence of DKA.  No anemia.  No leukocytosis.  Currently pending CTA head and neck to rule out vascular process.  Will follow up imaging and reassess.  Ultrasound ED Peripheral IV (Provider)  Date/Time: 07/02/2020 1:45 AM Performed by: Fatima Blank, MD Authorized by: Fatima Blank, MD   Procedure details:    Indications: multiple failed IV attempts     Skin Prep: chlorhexidine gluconate     Location:  Left AC   Angiocath:  20 G   Bedside Ultrasound Guided: Yes     Images: archived     Patient tolerated procedure without complications: Yes     Dressing applied: Yes       After antivert, dizziness significantly improved. She was give rocephin for UTI. CTA w/o dissection or significant occlusion. Patient ready for discharge. She ambulated around the ED w/ minimal assistance. Will DC with antivert and keflex.  The patient appears reasonably screened and/or stabilized for discharge and I doubt any other medical condition or other [facility] requiring further screening, evaluation, or treatment in the ED at this time prior to discharge. Safe for discharge with strict return precautions.  Disposition: Discharge  Condition: Good  I have discussed the results, Dx and Tx plan with the patient/family who expressed understanding and agree(s) with the plan. Discharge instructions discussed at length. The patient/family was given strict return precautions who verbalized understanding of the instructions. No further questions at time of discharge.    ED Discharge Orders         Ordered    meclizine (ANTIVERT) 12.5 MG tablet  3 times daily PRN        07/02/20 0148    cephALEXin  (KEFLEX) 500 MG capsule  2 times daily        07/02/20 0148          Follow Up: Burnard Bunting, MD [place] [id number] [phone]  Call  to schedule an appointment for close follow up      Adilynne Fitzwater, Grayce Sessions, MD 07/02/20 [phone]

## 2020-07-01 NOTE — ED Triage Notes (Signed)
Patient here POV from Home with Dizziness that is worse with certain movement.   Patient has been experiencing dizziness since Saturday (06/26/2020).  Dizziness has been persistent since along with Dull headache.   No Recent Trauma or Pain.  GCS 15.

## 2020-07-02 ENCOUNTER — Encounter (HOSPITAL_BASED_OUTPATIENT_CLINIC_OR_DEPARTMENT_OTHER): Payer: Self-pay | Admitting: Radiology

## 2020-07-02 ENCOUNTER — Emergency Department (HOSPITAL_BASED_OUTPATIENT_CLINIC_OR_DEPARTMENT_OTHER): Payer: Medicare Other

## 2020-07-02 DIAGNOSIS — R42 Dizziness and giddiness: Secondary | ICD-10-CM | POA: Diagnosis not present

## 2020-07-02 MED ORDER — IOHEXOL 350 MG/ML SOLN
100.0000 mL | Freq: Once | INTRAVENOUS | Status: AC | PRN
  Administered 2020-07-02: 75 mL via INTRAVENOUS

## 2020-07-02 MED ORDER — CEPHALEXIN 500 MG PO CAPS: 500.0000 mg | ORAL_CAPSULE | Freq: Two times a day (BID) | ORAL | 0 refills | Status: AC

## 2020-07-02 MED ORDER — MECLIZINE HCL 12.5 MG PO TABS: 12.5000 mg | ORAL_TABLET | Freq: Three times a day (TID) | ORAL | 0 refills | Status: DC | PRN

## 2020-07-02 NOTE — ED Notes (Signed)
Patient transported to Radiology at this time.

## 2020-07-02 NOTE — ED Notes (Signed)
This nurse assisted patient to walk around the department with her cane. Patient states she feels weak and a little dizzy, but nothing compared to how she felt earlier. Patient gait was steady with cane as assistance. Patient states she uses a walker at home.

## 2020-07-04 LAB — URINE CULTURE: Culture: >=100000 — AB

## 2020-07-05 ENCOUNTER — Telehealth: Payer: Self-pay | Admitting: Emergency Medicine

## 2020-07-05 NOTE — Telephone Encounter (Signed)
Post ED Visit - Positive Culture Follow-up  Culture report reviewed by antimicrobial stewardship pharmacist: [facility] Team []  Elenor Quinones, Pharm.D. []  Heide Guile, Pharm.D., BCPS AQ-ID []  Parks Neptune, Pharm.D., BCPS []  Alycia Rossetti, Pharm.D., BCPS []  Cartwright, Pharm.D., BCPS, AAHIVP []  Legrand Como, Pharm.D., BCPS, AAHIVP []  Salome Arnt, PharmD, BCPS []  Johnnette Gourd, PharmD, BCPS []  Hughes Better, PharmD, BCPS []  Leeroy Cha, PharmD []  Laqueta Linden, PharmD, BCPS []  Albertina Parr, PharmD  [facility] []  Leodis Sias, PharmD []  Lindell Spar, PharmD []  Royetta Asal, PharmD []  Graylin Shiver, Rph []  Rema Fendt) Glennon Mac, PharmD []  Arlyn Dunning, PharmD []  Netta Cedars, PharmD []  Dia Sitter, PharmD []  Leone Haven, PharmD []  Gretta Arab, PharmD []  Theodis Shove, PharmD []  Peggyann Juba, PharmD []  Reuel Boom, PharmD   Positive urine culture Treated with cephalexin, organism sensitive to the same and no further patient follow-up is required at this time.  Hazle Nordmann 07/05/2020, 3:21 PM

## 2020-07-08 DIAGNOSIS — H26491 Other secondary cataract, right eye: Secondary | ICD-10-CM | POA: Diagnosis not present

## 2020-07-13 ENCOUNTER — Ambulatory Visit (HOSPITAL_COMMUNITY): Payer: Medicare Other | Attending: Cardiovascular Disease

## 2020-07-13 ENCOUNTER — Other Ambulatory Visit: Payer: Self-pay

## 2020-07-13 DIAGNOSIS — R0602 Shortness of breath: Secondary | ICD-10-CM | POA: Insufficient documentation

## 2020-07-13 LAB — ECHOCARDIOGRAM COMPLETE
Area-P 1/2: 2.16 cm2
S' Lateral: 3.8 cm

## 2020-07-19 DIAGNOSIS — R42 Dizziness and giddiness: Secondary | ICD-10-CM | POA: Diagnosis not present

## 2020-07-19 DIAGNOSIS — R0781 Pleurodynia: Secondary | ICD-10-CM | POA: Diagnosis not present

## 2020-07-27 DIAGNOSIS — H6593 Unspecified nonsuppurative otitis media, bilateral: Secondary | ICD-10-CM | POA: Diagnosis not present

## 2020-07-27 DIAGNOSIS — I10 Essential (primary) hypertension: Secondary | ICD-10-CM | POA: Diagnosis not present

## 2020-07-27 DIAGNOSIS — W19XXXA Unspecified fall, initial encounter: Secondary | ICD-10-CM | POA: Diagnosis not present

## 2020-07-27 DIAGNOSIS — R42 Dizziness and giddiness: Secondary | ICD-10-CM | POA: Diagnosis not present

## 2020-07-27 DIAGNOSIS — N390 Urinary tract infection, site not specified: Secondary | ICD-10-CM | POA: Diagnosis not present

## 2020-07-27 DIAGNOSIS — R269 Unspecified abnormalities of gait and mobility: Secondary | ICD-10-CM | POA: Diagnosis not present

## 2020-09-15 DIAGNOSIS — R293 Abnormal posture: Secondary | ICD-10-CM | POA: Diagnosis not present

## 2020-09-15 DIAGNOSIS — M6281 Muscle weakness (generalized): Secondary | ICD-10-CM | POA: Diagnosis not present

## 2020-09-15 DIAGNOSIS — R2689 Other abnormalities of gait and mobility: Secondary | ICD-10-CM | POA: Diagnosis not present

## 2020-09-15 DIAGNOSIS — Z9181 History of falling: Secondary | ICD-10-CM | POA: Diagnosis not present

## 2020-09-21 DIAGNOSIS — R293 Abnormal posture: Secondary | ICD-10-CM | POA: Diagnosis not present

## 2020-09-21 DIAGNOSIS — Z9181 History of falling: Secondary | ICD-10-CM | POA: Diagnosis not present

## 2020-09-21 DIAGNOSIS — M6281 Muscle weakness (generalized): Secondary | ICD-10-CM | POA: Diagnosis not present

## 2020-09-21 DIAGNOSIS — R2689 Other abnormalities of gait and mobility: Secondary | ICD-10-CM | POA: Diagnosis not present

## 2020-09-23 DIAGNOSIS — M6281 Muscle weakness (generalized): Secondary | ICD-10-CM | POA: Diagnosis not present

## 2020-09-23 DIAGNOSIS — Z9181 History of falling: Secondary | ICD-10-CM | POA: Diagnosis not present

## 2020-09-23 DIAGNOSIS — R293 Abnormal posture: Secondary | ICD-10-CM | POA: Diagnosis not present

## 2020-09-23 DIAGNOSIS — R2689 Other abnormalities of gait and mobility: Secondary | ICD-10-CM | POA: Diagnosis not present

## 2020-09-28 DIAGNOSIS — R293 Abnormal posture: Secondary | ICD-10-CM | POA: Diagnosis not present

## 2020-09-28 DIAGNOSIS — R2689 Other abnormalities of gait and mobility: Secondary | ICD-10-CM | POA: Diagnosis not present

## 2020-09-28 DIAGNOSIS — M6281 Muscle weakness (generalized): Secondary | ICD-10-CM | POA: Diagnosis not present

## 2020-09-28 DIAGNOSIS — Z9181 History of falling: Secondary | ICD-10-CM | POA: Diagnosis not present

## 2020-10-05 DIAGNOSIS — M6281 Muscle weakness (generalized): Secondary | ICD-10-CM | POA: Diagnosis not present

## 2020-10-05 DIAGNOSIS — Z9181 History of falling: Secondary | ICD-10-CM | POA: Diagnosis not present

## 2020-10-05 DIAGNOSIS — R293 Abnormal posture: Secondary | ICD-10-CM | POA: Diagnosis not present

## 2020-10-05 DIAGNOSIS — R2689 Other abnormalities of gait and mobility: Secondary | ICD-10-CM | POA: Diagnosis not present

## 2020-10-07 DIAGNOSIS — R2689 Other abnormalities of gait and mobility: Secondary | ICD-10-CM | POA: Diagnosis not present

## 2020-10-07 DIAGNOSIS — Z9181 History of falling: Secondary | ICD-10-CM | POA: Diagnosis not present

## 2020-10-07 DIAGNOSIS — R293 Abnormal posture: Secondary | ICD-10-CM | POA: Diagnosis not present

## 2020-10-07 DIAGNOSIS — M6281 Muscle weakness (generalized): Secondary | ICD-10-CM | POA: Diagnosis not present

## 2020-10-12 DIAGNOSIS — R2689 Other abnormalities of gait and mobility: Secondary | ICD-10-CM | POA: Diagnosis not present

## 2020-10-12 DIAGNOSIS — R293 Abnormal posture: Secondary | ICD-10-CM | POA: Diagnosis not present

## 2020-10-12 DIAGNOSIS — Z9181 History of falling: Secondary | ICD-10-CM | POA: Diagnosis not present

## 2020-10-12 DIAGNOSIS — M6281 Muscle weakness (generalized): Secondary | ICD-10-CM | POA: Diagnosis not present

## 2020-10-14 DIAGNOSIS — H43811 Vitreous degeneration, right eye: Secondary | ICD-10-CM | POA: Diagnosis not present

## 2020-10-19 DIAGNOSIS — Z9181 History of falling: Secondary | ICD-10-CM | POA: Diagnosis not present

## 2020-10-19 DIAGNOSIS — R2689 Other abnormalities of gait and mobility: Secondary | ICD-10-CM | POA: Diagnosis not present

## 2020-10-19 DIAGNOSIS — M6281 Muscle weakness (generalized): Secondary | ICD-10-CM | POA: Diagnosis not present

## 2020-10-19 DIAGNOSIS — R293 Abnormal posture: Secondary | ICD-10-CM | POA: Diagnosis not present

## 2020-10-21 DIAGNOSIS — Z9181 History of falling: Secondary | ICD-10-CM | POA: Diagnosis not present

## 2020-10-21 DIAGNOSIS — R293 Abnormal posture: Secondary | ICD-10-CM | POA: Diagnosis not present

## 2020-10-21 DIAGNOSIS — M6281 Muscle weakness (generalized): Secondary | ICD-10-CM | POA: Diagnosis not present

## 2020-10-21 DIAGNOSIS — R2689 Other abnormalities of gait and mobility: Secondary | ICD-10-CM | POA: Diagnosis not present

## 2020-10-26 DIAGNOSIS — M6281 Muscle weakness (generalized): Secondary | ICD-10-CM | POA: Diagnosis not present

## 2020-10-26 DIAGNOSIS — R293 Abnormal posture: Secondary | ICD-10-CM | POA: Diagnosis not present

## 2020-10-26 DIAGNOSIS — Z9181 History of falling: Secondary | ICD-10-CM | POA: Diagnosis not present

## 2020-10-26 DIAGNOSIS — R2689 Other abnormalities of gait and mobility: Secondary | ICD-10-CM | POA: Diagnosis not present

## 2020-11-02 DIAGNOSIS — R293 Abnormal posture: Secondary | ICD-10-CM | POA: Diagnosis not present

## 2020-11-02 DIAGNOSIS — R2689 Other abnormalities of gait and mobility: Secondary | ICD-10-CM | POA: Diagnosis not present

## 2020-11-02 DIAGNOSIS — M6281 Muscle weakness (generalized): Secondary | ICD-10-CM | POA: Diagnosis not present

## 2020-11-02 DIAGNOSIS — Z9181 History of falling: Secondary | ICD-10-CM | POA: Diagnosis not present

## 2020-11-04 DIAGNOSIS — R2689 Other abnormalities of gait and mobility: Secondary | ICD-10-CM | POA: Diagnosis not present

## 2020-11-04 DIAGNOSIS — Z9181 History of falling: Secondary | ICD-10-CM | POA: Diagnosis not present

## 2020-11-04 DIAGNOSIS — R293 Abnormal posture: Secondary | ICD-10-CM | POA: Diagnosis not present

## 2020-11-04 DIAGNOSIS — M6281 Muscle weakness (generalized): Secondary | ICD-10-CM | POA: Diagnosis not present

## 2020-11-09 DIAGNOSIS — M6281 Muscle weakness (generalized): Secondary | ICD-10-CM | POA: Diagnosis not present

## 2020-11-09 DIAGNOSIS — R2689 Other abnormalities of gait and mobility: Secondary | ICD-10-CM | POA: Diagnosis not present

## 2020-11-09 DIAGNOSIS — Z9181 History of falling: Secondary | ICD-10-CM | POA: Diagnosis not present

## 2020-11-09 DIAGNOSIS — R293 Abnormal posture: Secondary | ICD-10-CM | POA: Diagnosis not present

## 2020-12-09 DIAGNOSIS — E039 Hypothyroidism, unspecified: Secondary | ICD-10-CM | POA: Diagnosis not present

## 2020-12-09 DIAGNOSIS — E785 Hyperlipidemia, unspecified: Secondary | ICD-10-CM | POA: Diagnosis not present

## 2020-12-11 ENCOUNTER — Encounter (HOSPITAL_BASED_OUTPATIENT_CLINIC_OR_DEPARTMENT_OTHER): Payer: Self-pay

## 2020-12-11 ENCOUNTER — Other Ambulatory Visit: Payer: Self-pay

## 2020-12-11 ENCOUNTER — Inpatient Hospital Stay (HOSPITAL_BASED_OUTPATIENT_CLINIC_OR_DEPARTMENT_OTHER)
Admission: EM | Admit: 2020-12-11 | Discharge: 2020-12-15 | DRG: 280 | Disposition: A | Discharge status: Home Health | Payer: Medicare Other | Attending: Internal Medicine | Admitting: Internal Medicine

## 2020-12-11 ENCOUNTER — Emergency Department (HOSPITAL_BASED_OUTPATIENT_CLINIC_OR_DEPARTMENT_OTHER): Payer: Medicare Other

## 2020-12-11 DIAGNOSIS — I25110 Atherosclerotic heart disease of native coronary artery with unstable angina pectoris: Secondary | ICD-10-CM | POA: Diagnosis not present

## 2020-12-11 DIAGNOSIS — H5462 Unqualified visual loss, left eye, normal vision right eye: Secondary | ICD-10-CM | POA: Diagnosis present

## 2020-12-11 DIAGNOSIS — E039 Hypothyroidism, unspecified: Secondary | ICD-10-CM | POA: Diagnosis present

## 2020-12-11 DIAGNOSIS — S0990XA Unspecified injury of head, initial encounter: Secondary | ICD-10-CM | POA: Diagnosis not present

## 2020-12-11 DIAGNOSIS — Z9011 Acquired absence of right breast and nipple: Secondary | ICD-10-CM

## 2020-12-11 DIAGNOSIS — Z20822 Contact with and (suspected) exposure to covid-19: Secondary | ICD-10-CM | POA: Diagnosis not present

## 2020-12-11 DIAGNOSIS — D72825 Bandemia: Secondary | ICD-10-CM | POA: Diagnosis not present

## 2020-12-11 DIAGNOSIS — Z79811 Long term (current) use of aromatase inhibitors: Secondary | ICD-10-CM

## 2020-12-11 DIAGNOSIS — D72829 Elevated white blood cell count, unspecified: Secondary | ICD-10-CM | POA: Diagnosis present

## 2020-12-11 DIAGNOSIS — Z79890 Hormone replacement therapy (postmenopausal): Secondary | ICD-10-CM

## 2020-12-11 DIAGNOSIS — R682 Dry mouth, unspecified: Secondary | ICD-10-CM | POA: Diagnosis not present

## 2020-12-11 DIAGNOSIS — I10 Essential (primary) hypertension: Secondary | ICD-10-CM | POA: Diagnosis not present

## 2020-12-11 DIAGNOSIS — R4182 Altered mental status, unspecified: Secondary | ICD-10-CM | POA: Diagnosis not present

## 2020-12-11 DIAGNOSIS — I110 Hypertensive heart disease with heart failure: Secondary | ICD-10-CM | POA: Diagnosis present

## 2020-12-11 DIAGNOSIS — I214 Non-ST elevation (NSTEMI) myocardial infarction: Secondary | ICD-10-CM | POA: Diagnosis not present

## 2020-12-11 DIAGNOSIS — E785 Hyperlipidemia, unspecified: Secondary | ICD-10-CM | POA: Diagnosis present

## 2020-12-11 DIAGNOSIS — K219 Gastro-esophageal reflux disease without esophagitis: Secondary | ICD-10-CM | POA: Diagnosis not present

## 2020-12-11 DIAGNOSIS — Z9181 History of falling: Secondary | ICD-10-CM

## 2020-12-11 DIAGNOSIS — I440 Atrioventricular block, first degree: Secondary | ICD-10-CM | POA: Diagnosis present

## 2020-12-11 DIAGNOSIS — J9811 Atelectasis: Secondary | ICD-10-CM | POA: Diagnosis not present

## 2020-12-11 DIAGNOSIS — R079 Chest pain, unspecified: Secondary | ICD-10-CM | POA: Diagnosis not present

## 2020-12-11 DIAGNOSIS — I252 Old myocardial infarction: Secondary | ICD-10-CM

## 2020-12-11 DIAGNOSIS — I7121 Aneurysm of the ascending aorta, without rupture: Secondary | ICD-10-CM | POA: Diagnosis present

## 2020-12-11 DIAGNOSIS — I2510 Atherosclerotic heart disease of native coronary artery without angina pectoris: Secondary | ICD-10-CM | POA: Diagnosis present

## 2020-12-11 DIAGNOSIS — C50911 Malignant neoplasm of unspecified site of right female breast: Secondary | ICD-10-CM | POA: Diagnosis not present

## 2020-12-11 DIAGNOSIS — Z170 Estrogen receptor positive status [ER+]: Secondary | ICD-10-CM

## 2020-12-11 DIAGNOSIS — E876 Hypokalemia: Secondary | ICD-10-CM | POA: Diagnosis not present

## 2020-12-11 DIAGNOSIS — R0789 Other chest pain: Secondary | ICD-10-CM | POA: Diagnosis not present

## 2020-12-11 DIAGNOSIS — I472 Ventricular tachycardia, unspecified: Secondary | ICD-10-CM | POA: Diagnosis not present

## 2020-12-11 DIAGNOSIS — I447 Left bundle-branch block, unspecified: Secondary | ICD-10-CM | POA: Diagnosis present

## 2020-12-11 DIAGNOSIS — E7800 Pure hypercholesterolemia, unspecified: Secondary | ICD-10-CM | POA: Diagnosis present

## 2020-12-11 DIAGNOSIS — Z91040 Latex allergy status: Secondary | ICD-10-CM | POA: Diagnosis not present

## 2020-12-11 DIAGNOSIS — D696 Thrombocytopenia, unspecified: Secondary | ICD-10-CM | POA: Diagnosis present

## 2020-12-11 DIAGNOSIS — Z7982 Long term (current) use of aspirin: Secondary | ICD-10-CM

## 2020-12-11 DIAGNOSIS — Z9842 Cataract extraction status, left eye: Secondary | ICD-10-CM

## 2020-12-11 DIAGNOSIS — B9620 Unspecified Escherichia coli [E. coli] as the cause of diseases classified elsewhere: Secondary | ICD-10-CM | POA: Diagnosis present

## 2020-12-11 DIAGNOSIS — G9341 Metabolic encephalopathy: Secondary | ICD-10-CM | POA: Diagnosis not present

## 2020-12-11 DIAGNOSIS — G2581 Restless legs syndrome: Secondary | ICD-10-CM | POA: Diagnosis present

## 2020-12-11 DIAGNOSIS — Z955 Presence of coronary angioplasty implant and graft: Secondary | ICD-10-CM

## 2020-12-11 DIAGNOSIS — N390 Urinary tract infection, site not specified: Secondary | ICD-10-CM | POA: Diagnosis not present

## 2020-12-11 DIAGNOSIS — R269 Unspecified abnormalities of gait and mobility: Secondary | ICD-10-CM | POA: Diagnosis present

## 2020-12-11 DIAGNOSIS — Z9841 Cataract extraction status, right eye: Secondary | ICD-10-CM

## 2020-12-11 DIAGNOSIS — R296 Repeated falls: Secondary | ICD-10-CM

## 2020-12-11 DIAGNOSIS — Z888 Allergy status to other drugs, medicaments and biological substances status: Secondary | ICD-10-CM

## 2020-12-11 DIAGNOSIS — Z90710 Acquired absence of both cervix and uterus: Secondary | ICD-10-CM

## 2020-12-11 DIAGNOSIS — R0781 Pleurodynia: Secondary | ICD-10-CM

## 2020-12-11 DIAGNOSIS — G319 Degenerative disease of nervous system, unspecified: Secondary | ICD-10-CM | POA: Diagnosis not present

## 2020-12-11 DIAGNOSIS — Z8249 Family history of ischemic heart disease and other diseases of the circulatory system: Secondary | ICD-10-CM

## 2020-12-11 DIAGNOSIS — I5042 Chronic combined systolic (congestive) and diastolic (congestive) heart failure: Secondary | ICD-10-CM | POA: Diagnosis not present

## 2020-12-11 DIAGNOSIS — Z79899 Other long term (current) drug therapy: Secondary | ICD-10-CM

## 2020-12-11 DIAGNOSIS — R5381 Other malaise: Secondary | ICD-10-CM | POA: Diagnosis present

## 2020-12-11 DIAGNOSIS — I4430 Unspecified atrioventricular block: Secondary | ICD-10-CM | POA: Diagnosis not present

## 2020-12-11 DIAGNOSIS — Z96651 Presence of right artificial knee joint: Secondary | ICD-10-CM | POA: Diagnosis present

## 2020-12-11 DIAGNOSIS — Z85850 Personal history of malignant neoplasm of thyroid: Secondary | ICD-10-CM

## 2020-12-11 DIAGNOSIS — R319 Hematuria, unspecified: Secondary | ICD-10-CM | POA: Diagnosis not present

## 2020-12-11 DIAGNOSIS — Z96642 Presence of left artificial hip joint: Secondary | ICD-10-CM | POA: Diagnosis present

## 2020-12-11 HISTORY — DX: Atherosclerotic heart disease of native coronary artery without angina pectoris: I25.10

## 2020-12-11 HISTORY — DX: Chronic combined systolic (congestive) and diastolic (congestive) heart failure: I50.42

## 2020-12-11 HISTORY — DX: Atrioventricular block, first degree: I44.0

## 2020-12-11 HISTORY — DX: Thoracic aortic ectasia: I77.810

## 2020-12-11 LAB — CBC
HCT: 40.1 % (ref 36.0–46.0)
Hemoglobin: 13 g/dL (ref 12.0–15.0)
MCH: 30.9 pg (ref 26.0–34.0)
MCHC: 32.4 g/dL (ref 30.0–36.0)
MCV: 95.2 fL (ref 80.0–100.0)
Platelets: 128 10*3/uL — ABNORMAL LOW (ref 150–400)
RBC: 4.21 MIL/uL (ref 3.87–5.11)
RDW: 13.2 % (ref 11.5–15.5)
WBC: 11.4 10*3/uL — ABNORMAL HIGH (ref 4.0–10.5)
nRBC: 0 % (ref 0.0–0.2)

## 2020-12-11 LAB — BASIC METABOLIC PANEL
Anion gap: 9 (ref 5–15)
BUN: 28 mg/dL — ABNORMAL HIGH (ref 8–23)
CO2: 25 mmol/L (ref 22–32)
Calcium: 8.9 mg/dL (ref 8.9–10.3)
Chloride: 105 mmol/L (ref 98–111)
Creatinine, Ser: 0.77 mg/dL (ref 0.44–1.00)
GFR, Estimated: >60 mL/min (ref >60)
Glucose, Bld: 185 mg/dL — ABNORMAL HIGH (ref 70–99)
Potassium: 3.5 mmol/L (ref 3.5–5.1)
Sodium: 139 mmol/L (ref 135–145)

## 2020-12-11 LAB — TROPONIN I (HIGH SENSITIVITY): Troponin I (High Sensitivity): 13 ng/L (ref <18)

## 2020-12-11 LAB — CBG MONITORING, ED: Glucose-Capillary: 126 mg/dL — ABNORMAL HIGH (ref 70–99)

## 2020-12-11 MED ORDER — ASPIRIN 81 MG PO CHEW
324.0000 mg | CHEWABLE_TABLET | Freq: Once | ORAL | Status: AC
  Administered 2020-12-11: 324 mg via ORAL
  Filled 2020-12-11: qty 4

## 2020-12-11 MED ORDER — NITROGLYCERIN 0.4 MG SL SUBL
0.4000 mg | SUBLINGUAL_TABLET | SUBLINGUAL | Status: DC | PRN
  Administered 2020-12-11: 0.4 mg via SUBLINGUAL
  Filled 2020-12-11: qty 1

## 2020-12-11 MED ORDER — ONDANSETRON HCL 4 MG/2ML IJ SOLN
4.0000 mg | Freq: Once | INTRAMUSCULAR | Status: AC
  Administered 2020-12-11: 4 mg via INTRAVENOUS
  Filled 2020-12-11: qty 2

## 2020-12-11 NOTE — ED Triage Notes (Addendum)
Pt is present for central chest pain that radiates to her left shoulder that started this evening. Pt felt nauseated all day but pain started this evening. Pt describes the pain as a pressure and "someone is sitting on her chest". Pt states she has never had this type of pain before. Pt also states she became diaphoretic this evening but "she always does". Hx of MI.

## 2020-12-11 NOTE — ED Provider Notes (Signed)
[facility] Provider Note   CSN: [id number] Arrival date & time: 12/11/20  2254     History Chief Complaint  Patient presents with   Chest Pain    Kathy Murphy is a 87 y.o. female.  86 year old female with a chief complaint of chest pain.  Going on for couple days but really worsening this evening.  Worse with exertion and better with rest.  Having some shortness of breath with this.  Has diaphoresis off and on at baseline does not think it is worse with this.  Has a very minimal cough UTI.  Denies fevers or chills.  Has a history of stents placed but was asymptomatic at the time and was placed when found on screening for other procedures.  She denies history of PE or DVT denies unilateral lower extremity edema denies hemoptysis recent surgery or hospitalization.  The history is provided by the patient.  Chest Pain Pain location:  Substernal area Pain radiates to:  Does not radiate Pain severity:  Moderate Onset quality:  Gradual Duration:  2 days Timing:  Constant Progression:  Worsening Chronicity:  New Relieved by:  Nothing Worsened by:  Nothing Ineffective treatments:  None tried Associated symptoms: no dizziness, no fever, no headache, no nausea, no palpitations, no shortness of breath and no vomiting       Past Medical History:  Diagnosis Date   Arthritis    Breast cancer ([place])    Right   Cancer ([place])    thyroid   GERD (gastroesophageal reflux disease)    GI bleed 1980   AFTER POLYP EXCISION   Heart disease, hypertensive    Hypercholesterolemia    Hypertension    Hypothyroidism    Incontinence of urine    Myalgia    Myocardial infarction ([facility])    Obesity    Pain in joints    Retinal detachment    Unsteady gait    UTI (urinary tract infection)    Vision loss of left eye    Vomiting     Patient Active Problem List   Diagnosis Date Noted   Status post total knee replacement, right 05/18/2017   Dyslipidemia 04/05/2017    Obesity    Myocardial infarction ([place])    Incontinence of urine    Hypertension    Seroma of breast 01/08/2017   Malignant neoplasm of upper-outer quadrant of right breast in female, estrogen receptor positive ([place]) 08/31/2016   CHF (congestive heart failure), NYHA class I, chronic, combined ([place]) 04/25/2016   Essential hypertension 04/25/2016   CAD (coronary artery disease), native coronary artery 02/11/2016   Chronic total occlusion of coronary artery    Coronary artery disease    Unstable angina pectoris (H[facility])    Chest pain 02/07/2016   Abnormal nuclear cardiac imaging test 02/07/2016   Nipple discharge in female 04/17/2011   Heart disease, hypertensive    Hypercholesterolemia    Arthritis    Unsteady gait    Myalgia    Pain in joints    Retinal detachment    Hypothyroidism    GERD (gastroesophageal reflux disease)    UTI (urinary tract infection)    Vomiting    Overweight (BMI 25.0-29.9)    GI bleed     Past Surgical History:  Procedure Laterality Date   ABDOMINAL HYSTERECTOMY  1977   BREAST BIOPSY     BREAST LUMPECTOMY     x5   CARDIAC CATHETERIZATION N/A 02/08/2016   Procedure: Left Heart Cath and Coronary  Angiography;  Surgeon: Belva Crome, MD;  Location: [facility];  Service: Cardiovascular;  Laterality: N/A;   CARDIAC CATHETERIZATION N/A 02/11/2016   Procedure: Coronary Stent Intervention;  Surgeon: Belva Crome, MD;  Location: [facility];  Service: Cardiovascular;  Laterality: N/A;   CATARACT EXTRACTION, BILATERAL     CERVICAL LAMINECTOMY     DENTAL SURGERY  1952   EVACUATION BREAST HEMATOMA Right 01/09/2017   Procedure: EVACUATION RIGHT MASTECTOMY FLUID COLLECTION;  Surgeon: Rolm Bookbinder, MD;  Location: [facility];  Service: General;  Laterality: Right;   [place]  2012   left   TOTAL KNEE ARTHROPLASTY Right 05/18/2017   Procedure: RIGHT TOTAL KNEE ARTHROPLASTY;   Surgeon: Netta Cedars, MD;  Location: [facility];  Service: Orthopedics;  Laterality: Right;   TOTAL MASTECTOMY Right 11/27/2016   TOTAL MASTECTOMY Right 11/27/2016   Procedure: RIGHT TOTAL MASTECTOMY;  Surgeon: Rolm Bookbinder, MD;  Location: [facility];  Service: General;  Laterality: Right;     OB History   No obstetric history on file.     Family History  Problem Relation Age of Onset   Heart disease Mother     Social History   Tobacco Use   Smoking status: Never   Smokeless tobacco: Never  Vaping Use   Vaping Use: Never used  Substance Use Topics   Alcohol use: No   Drug use: No    Home Medications Prior to Admission medications   Medication Sig Start Date End Date Taking? Authorizing Provider  acetaminophen (TYLENOL) 650 MG CR tablet Take 650 mg by mouth every 8 (eight) hours as needed for pain.    [provider]  anastrozole (ARIMIDEX) 1 MG tablet TAKE 1 TABLET DAILY 03/22/20   Magrinat, Virgie Dad, MD  aspirin EC 81 MG tablet Take 81 mg by mouth daily.    [provider]  Carboxymethylcellulose Sodium (REFRESH TEARS OP) Apply 1 drop to eye daily.    [provider]  Cholecalciferol (VITAMIN D3) 125 MCG (5000 UT) CAPS Take 1 capsule by mouth daily.    [provider]  diazepam (VALIUM) 2 MG tablet Take 2 mg by mouth every 6 (six) hours as needed for anxiety.    [provider]  Diclofenac Sodium CR 100 MG 24 hr tablet Take 100 mg by mouth daily. 09/02/16   [provider]  levothyroxine (SYNTHROID, LEVOTHROID) 125 MCG tablet Take 125 mcg by mouth daily before breakfast.    [provider]  losartan (COZAAR) 100 MG tablet Take 100 mg by mouth daily.  03/30/11   [provider]  meclizine (ANTIVERT) 12.5 MG tablet Take 1 tablet (12.5 mg total) by mouth 3 (three) times daily as needed for dizziness. 07/02/20   Fatima Blank, MD  metoprolol succinate (TOPROL-XL) 100 MG 24 hr tablet TAKE 1 TABLET DAILY  04/13/20   Croitoru, Mihai, MD  pantoprazole (PROTONIX) 40 MG tablet Take 40 mg by mouth daily.    [provider]  rosuvastatin (CRESTOR) 10 MG tablet Take 10 mg by mouth every morning.    [provider]    Allergies    Adhesive [tape], Other, and Latex  Review of Systems   Review of Systems  Constitutional:  Negative for chills and fever.  HENT:  Negative for congestion and rhinorrhea.   Eyes:  Negative for redness and visual disturbance.  Respiratory:  Negative for shortness of breath and wheezing.   Cardiovascular:  Positive for chest pain. Negative for palpitations.  Gastrointestinal:  Negative for nausea and vomiting.  Genitourinary:  Negative for dysuria and urgency.  Musculoskeletal:  Negative for arthralgias and myalgias.  Skin:  Negative for pallor and wound.  Neurological:  Negative for dizziness and headaches.   Physical Exam Updated Vital Signs BP (!) 151/63   Pulse 70   Temp 98.3 F (36.8 C)   Resp 13   Ht 5\' 9"  (1.753 m)   Wt 86.2 kg   SpO2 95%   BMI 28.06 kg/m   Physical Exam Vitals and nursing note reviewed.  Constitutional:      General: She is not in acute distress.    Appearance: She is well-developed. She is not diaphoretic.  HENT:     Head: Normocephalic and atraumatic.  Eyes:     Pupils: Pupils are equal, round, and reactive to light.  Cardiovascular:     Rate and Rhythm: Normal rate and regular rhythm.     Heart sounds: No murmur heard.   No friction rub. No gallop.  Pulmonary:     Effort: Pulmonary effort is normal.     Breath sounds: No wheezing or rales.  Abdominal:     General: There is no distension.     Palpations: Abdomen is soft.     Tenderness: There is no abdominal tenderness.  Musculoskeletal:        General: No tenderness.     Cervical back: Normal range of motion and neck supple.  Skin:    General: Skin is warm and dry.  Neurological:     Mental Status: She is alert and oriented to person, place, and  time.  Psychiatric:        Behavior: Behavior normal.    ED Results / Procedures / Treatments   Labs (all labs ordered are listed, but only abnormal results are displayed) Labs Reviewed  BASIC METABOLIC PANEL - Abnormal; Notable for the following components:      Result Value   Glucose, Bld 185 (*)    BUN 28 (*)    All other components within normal limits  CBC - Abnormal; Notable for the following components:   WBC 11.4 (*)    Platelets 128 (*)    All other components within normal limits  CBG MONITORING, ED - Abnormal; Notable for the following components:   Glucose-Capillary 126 (*)    All other components within normal limits  RESP PANEL BY RT-PCR (FLU A&B, COVID) ARPGX2  HEPATIC FUNCTION PANEL  LIPASE, BLOOD  TROPONIN I (HIGH SENSITIVITY)    EKG EKG Interpretation  Date/Time:  Saturday December 11 2020 23:02:56 EDT Ventricular Rate:  78 PR Interval:  262 QRS Duration: 104 QT Interval:  363 QTC Calculation: 414 R Axis:   3 Text Interpretation: Sinus rhythm Prolonged PR interval Consider left atrial enlargement Probable LVH with secondary repol abnrm Anterior Q waves, possibly due to LVH negative deflection of st segments in inferior and lateral leads seen on prior though more pronounced Otherwise no significant change Confirmed by Deno Etienne [phone]) on 12/11/2020 11:04:56 PM  Radiology DG Chest Portable 1 View  Result Date: 12/11/2020 CLINICAL DATA:  Chest pain. EXAM: PORTABLE CHEST 1 VIEW COMPARISON:  CT chest 09/09/2010. FINDINGS: Surgical clips overlie the lower neck and right axilla. Aorta is tortuous. The heart is borderline enlarged. There is prominence of the right upper paratracheal region corresponding to ectatic vasculature on  CT. There is no focal lung consolidation, pleural effusion or pneumothorax. There is minimal left basilar atelectasis. No acute fractures are seen. IMPRESSION: Left basilar atelectasis. Electronically Signed   By: Ronney Asters M.D.   On:  12/11/2020 23:49    Procedures Procedures   Medications Ordered in ED Medications  nitroGLYCERIN (NITROSTAT) SL tablet 0.4 mg (0.4 mg Sublingual Given 12/11/20 2322)  aspirin chewable tablet 324 mg (324 mg Oral Given 12/11/20 2322)  ondansetron (ZOFRAN) injection 4 mg (4 mg Intravenous Given 12/11/20 2339)    ED Course  I have reviewed the triage vital signs and the nursing notes.  Pertinent labs & imaging results that were available during my care of the patient were reviewed by me and considered in my medical decision making (see chart for details).    MDM Rules/Calculators/A&P                           87 yo F with chief complaints of chest pain.  Patient's chest pain unfortunately is very typical of ACS and she has a history of multiple stents placed in the past.  EKG with some downsloping ST changes though seem to be more negative deflection based on the widened QRS.  We will obtain a laboratory evaluation will give aspirin and nitro.  Will discuss with cardiology.  I discussed the case with Dr. Marcelle Smiling, cards fellow on-call.  He independently evaluated the EKG and agree that there are no obvious ischemic changes.  Plan for hospitalist admission [facility] at [facility] and cardiology would consult.  Patient's pain is significantly improved with nitro and aspirin.  CRITICAL CARE Performed by: Cecilio Asper   Total critical care time: 35 minutes  Critical care time was exclusive of separately billable procedures and treating other patients.  Critical care was necessary to treat or prevent imminent or life-threatening deterioration.  Critical care was time spent personally by me on the following activities: development of treatment plan with patient and/or surrogate as well as nursing, discussions with consultants, evaluation of patient's response to treatment, examination of patient, obtaining history from patient or surrogate, ordering and performing treatments and interventions,  ordering and review of laboratory studies, ordering and review of radiographic studies, pulse oximetry and re-evaluation of patient's condition.   The patients results and plan were reviewed and discussed.   Any x-rays performed were independently reviewed by myself.   Differential diagnosis were considered with the presenting HPI.  Medications  nitroGLYCERIN (NITROSTAT) SL tablet 0.4 mg (0.4 mg Sublingual Given 12/11/20 2322)  aspirin chewable tablet 324 mg (324 mg Oral Given 12/11/20 2322)  ondansetron (ZOFRAN) injection 4 mg (4 mg Intravenous Given 12/11/20 2339)    Vitals:   12/11/20 2301 12/11/20 2303 12/11/20 2315 12/11/20 2348  BP:  (!) 158/68 (!) 151/63   Pulse:  77 70 70  Resp:  17 19 13   Temp:  98.3 F (36.8 C)    SpO2:  96% 95% 95%  Weight: 86.2 kg     Height: 5\' 9"  (1.753 m)       Final diagnoses:  Chest pain with high risk for cardiac etiology    Admission/ observation were discussed with the admitting physician, patient and/or family and they are comfortable with the plan.   Final Clinical Impression(s) / ED Diagnoses Final diagnoses:  Chest pain with high risk for cardiac etiology    Rx / DC Orders ED Discharge Orders     None  Deno Etienne, DO 12/12/20 [phone]

## 2020-12-12 ENCOUNTER — Encounter (HOSPITAL_COMMUNITY): Payer: Self-pay | Admitting: Internal Medicine

## 2020-12-12 ENCOUNTER — Observation Stay (HOSPITAL_COMMUNITY): Payer: Medicare Other

## 2020-12-12 DIAGNOSIS — I214 Non-ST elevation (NSTEMI) myocardial infarction: Principal | ICD-10-CM

## 2020-12-12 DIAGNOSIS — R4182 Altered mental status, unspecified: Secondary | ICD-10-CM | POA: Diagnosis not present

## 2020-12-12 DIAGNOSIS — E7800 Pure hypercholesterolemia, unspecified: Secondary | ICD-10-CM | POA: Diagnosis present

## 2020-12-12 DIAGNOSIS — H5462 Unqualified visual loss, left eye, normal vision right eye: Secondary | ICD-10-CM | POA: Diagnosis present

## 2020-12-12 DIAGNOSIS — I5042 Chronic combined systolic (congestive) and diastolic (congestive) heart failure: Secondary | ICD-10-CM

## 2020-12-12 DIAGNOSIS — I25110 Atherosclerotic heart disease of native coronary artery with unstable angina pectoris: Secondary | ICD-10-CM | POA: Diagnosis present

## 2020-12-12 DIAGNOSIS — D696 Thrombocytopenia, unspecified: Secondary | ICD-10-CM | POA: Diagnosis present

## 2020-12-12 DIAGNOSIS — G319 Degenerative disease of nervous system, unspecified: Secondary | ICD-10-CM | POA: Diagnosis not present

## 2020-12-12 DIAGNOSIS — Z20822 Contact with and (suspected) exposure to covid-19: Secondary | ICD-10-CM | POA: Diagnosis present

## 2020-12-12 DIAGNOSIS — E876 Hypokalemia: Secondary | ICD-10-CM | POA: Diagnosis not present

## 2020-12-12 DIAGNOSIS — Z7982 Long term (current) use of aspirin: Secondary | ICD-10-CM | POA: Diagnosis not present

## 2020-12-12 DIAGNOSIS — R296 Repeated falls: Secondary | ICD-10-CM | POA: Diagnosis not present

## 2020-12-12 DIAGNOSIS — B9620 Unspecified Escherichia coli [E. coli] as the cause of diseases classified elsewhere: Secondary | ICD-10-CM | POA: Diagnosis present

## 2020-12-12 DIAGNOSIS — I440 Atrioventricular block, first degree: Secondary | ICD-10-CM | POA: Diagnosis present

## 2020-12-12 DIAGNOSIS — R319 Hematuria, unspecified: Secondary | ICD-10-CM | POA: Diagnosis not present

## 2020-12-12 DIAGNOSIS — N390 Urinary tract infection, site not specified: Secondary | ICD-10-CM | POA: Diagnosis present

## 2020-12-12 DIAGNOSIS — D72829 Elevated white blood cell count, unspecified: Secondary | ICD-10-CM | POA: Diagnosis present

## 2020-12-12 DIAGNOSIS — R079 Chest pain, unspecified: Secondary | ICD-10-CM | POA: Diagnosis present

## 2020-12-12 DIAGNOSIS — I4430 Unspecified atrioventricular block: Secondary | ICD-10-CM | POA: Diagnosis not present

## 2020-12-12 DIAGNOSIS — E039 Hypothyroidism, unspecified: Secondary | ICD-10-CM | POA: Diagnosis present

## 2020-12-12 DIAGNOSIS — S0990XA Unspecified injury of head, initial encounter: Secondary | ICD-10-CM | POA: Diagnosis not present

## 2020-12-12 DIAGNOSIS — C50911 Malignant neoplasm of unspecified site of right female breast: Secondary | ICD-10-CM | POA: Diagnosis present

## 2020-12-12 DIAGNOSIS — R0781 Pleurodynia: Secondary | ICD-10-CM

## 2020-12-12 DIAGNOSIS — D72825 Bandemia: Secondary | ICD-10-CM | POA: Diagnosis not present

## 2020-12-12 DIAGNOSIS — I7121 Aneurysm of the ascending aorta, without rupture: Secondary | ICD-10-CM | POA: Diagnosis present

## 2020-12-12 DIAGNOSIS — I10 Essential (primary) hypertension: Secondary | ICD-10-CM | POA: Diagnosis not present

## 2020-12-12 DIAGNOSIS — I252 Old myocardial infarction: Secondary | ICD-10-CM | POA: Diagnosis not present

## 2020-12-12 DIAGNOSIS — I2510 Atherosclerotic heart disease of native coronary artery without angina pectoris: Secondary | ICD-10-CM

## 2020-12-12 DIAGNOSIS — I472 Ventricular tachycardia, unspecified: Secondary | ICD-10-CM | POA: Diagnosis present

## 2020-12-12 DIAGNOSIS — G2581 Restless legs syndrome: Secondary | ICD-10-CM | POA: Diagnosis present

## 2020-12-12 DIAGNOSIS — Z91040 Latex allergy status: Secondary | ICD-10-CM | POA: Diagnosis not present

## 2020-12-12 DIAGNOSIS — Z79890 Hormone replacement therapy (postmenopausal): Secondary | ICD-10-CM | POA: Diagnosis not present

## 2020-12-12 DIAGNOSIS — Z79899 Other long term (current) drug therapy: Secondary | ICD-10-CM | POA: Diagnosis not present

## 2020-12-12 DIAGNOSIS — Z888 Allergy status to other drugs, medicaments and biological substances status: Secondary | ICD-10-CM | POA: Diagnosis not present

## 2020-12-12 DIAGNOSIS — I110 Hypertensive heart disease with heart failure: Secondary | ICD-10-CM | POA: Diagnosis present

## 2020-12-12 DIAGNOSIS — G9341 Metabolic encephalopathy: Secondary | ICD-10-CM | POA: Diagnosis not present

## 2020-12-12 DIAGNOSIS — K219 Gastro-esophageal reflux disease without esophagitis: Secondary | ICD-10-CM | POA: Diagnosis not present

## 2020-12-12 LAB — HEPATIC FUNCTION PANEL
ALT: 11 U/L (ref 0–44)
AST: 15 U/L (ref 15–41)
Albumin: 4.1 g/dL (ref 3.5–5.0)
Alkaline Phosphatase: 83 U/L (ref 38–126)
Bilirubin, Direct: 0.2 mg/dL (ref 0.0–0.2)
Indirect Bilirubin: 0.7 mg/dL (ref 0.3–0.9)
Total Bilirubin: 0.9 mg/dL (ref 0.3–1.2)
Total Protein: 6.7 g/dL (ref 6.5–8.1)

## 2020-12-12 LAB — URINALYSIS, ROUTINE W REFLEX MICROSCOPIC
Bilirubin Urine: NEGATIVE
Glucose, UA: 50 mg/dL — AB
Ketones, ur: NEGATIVE mg/dL
Nitrite: POSITIVE — AB
Protein, ur: 100 mg/dL — AB
Specific Gravity, Urine: 1.018 (ref 1.005–1.030)
WBC, UA: >50 WBC/hpf — ABNORMAL HIGH (ref 0–5)
pH: 5 (ref 5.0–8.0)

## 2020-12-12 LAB — BASIC METABOLIC PANEL
Anion gap: 12 (ref 5–15)
BUN: 22 mg/dL (ref 8–23)
CO2: 20 mmol/L — ABNORMAL LOW (ref 22–32)
Calcium: 8.9 mg/dL (ref 8.9–10.3)
Chloride: 108 mmol/L (ref 98–111)
Creatinine, Ser: 0.9 mg/dL (ref 0.44–1.00)
GFR, Estimated: >60 mL/min (ref >60)
Glucose, Bld: 143 mg/dL — ABNORMAL HIGH (ref 70–99)
Potassium: 3.6 mmol/L (ref 3.5–5.1)
Sodium: 140 mmol/L (ref 135–145)

## 2020-12-12 LAB — ECHOCARDIOGRAM COMPLETE
AR max vel: 1.96 cm2
AV Area VTI: 2.1 cm2
AV Area mean vel: 1.79 cm2
AV Mean grad: 5 mmHg
AV Peak grad: 8 mmHg
Ao pk vel: 1.41 m/s
Area-P 1/2: 4.8 cm2
Height: 69 in
MV VTI: 2.02 cm2
Weight: 3040 oz

## 2020-12-12 LAB — HEPARIN LEVEL (UNFRACTIONATED)
Heparin Unfractionated: 0.26 IU/mL — ABNORMAL LOW (ref 0.30–0.70)
Heparin Unfractionated: 0.34 IU/mL (ref 0.30–0.70)

## 2020-12-12 LAB — HEMOGLOBIN A1C
Hgb A1c MFr Bld: 5.5 % (ref 4.8–5.6)
Mean Plasma Glucose: 111.15 mg/dL

## 2020-12-12 LAB — TROPONIN I (HIGH SENSITIVITY)
Troponin I (High Sensitivity): 64 ng/L — ABNORMAL HIGH (ref <18)
Troponin I (High Sensitivity): 64 ng/L — ABNORMAL HIGH (ref <18)
Troponin I (High Sensitivity): 95 ng/L — ABNORMAL HIGH (ref <18)
Troponin I (High Sensitivity): 168 ng/L (ref <18)
Troponin I (High Sensitivity): 169 ng/L (ref <18)

## 2020-12-12 LAB — MAGNESIUM: Magnesium: 2 mg/dL (ref 1.7–2.4)

## 2020-12-12 LAB — RESP PANEL BY RT-PCR (FLU A&B, COVID) ARPGX2
Influenza A by PCR: NEGATIVE
Influenza B by PCR: NEGATIVE
SARS Coronavirus 2 by RT PCR: NEGATIVE

## 2020-12-12 LAB — TSH: TSH: 0.432 u[IU]/mL (ref 0.350–4.500)

## 2020-12-12 LAB — LIPASE, BLOOD: Lipase: 30 U/L (ref 11–51)

## 2020-12-12 MED ORDER — SODIUM CHLORIDE 0.9 % IV SOLN: 250.0000 mL | INTRAVENOUS | Status: DC | PRN

## 2020-12-12 MED ORDER — PANTOPRAZOLE SODIUM 40 MG PO TBEC
40.0000 mg | DELAYED_RELEASE_TABLET | Freq: Every day | ORAL | Status: DC
  Administered 2020-12-12 – 2020-12-15 (×4): 40 mg via ORAL
  Filled 2020-12-12 (×3): qty 1

## 2020-12-12 MED ORDER — ANASTROZOLE 1 MG PO TABS
1.0000 mg | ORAL_TABLET | Freq: Every day | ORAL | Status: DC
  Administered 2020-12-12 – 2020-12-15 (×4): 1 mg via ORAL
  Filled 2020-12-12 (×4): qty 1

## 2020-12-12 MED ORDER — POLYVINYL ALCOHOL 1.4 % OP SOLN
1.0000 [drp] | Freq: Every day | OPHTHALMIC | Status: DC
  Administered 2020-12-13 – 2020-12-15 (×3): 1 [drp] via OPHTHALMIC
  Filled 2020-12-12: qty 15

## 2020-12-12 MED ORDER — SODIUM CHLORIDE 0.9 % WEIGHT BASED INFUSION
1.0000 mL/kg/h | INTRAVENOUS | Status: DC
  Administered 2020-12-13: 1 mL/kg/h via INTRAVENOUS

## 2020-12-12 MED ORDER — ASPIRIN EC 81 MG PO TBEC
81.0000 mg | DELAYED_RELEASE_TABLET | Freq: Every day | ORAL | Status: DC
  Administered 2020-12-13 – 2020-12-15 (×3): 81 mg via ORAL
  Filled 2020-12-12 (×3): qty 1

## 2020-12-12 MED ORDER — MORPHINE SULFATE (PF) 2 MG/ML IV SOLN: 1.0000 mg | INTRAVENOUS | Status: DC | PRN

## 2020-12-12 MED ORDER — SODIUM CHLORIDE 0.9% FLUSH
3.0000 mL | Freq: Two times a day (BID) | INTRAVENOUS | Status: DC
  Administered 2020-12-12 – 2020-12-13 (×2): 3 mL via INTRAVENOUS

## 2020-12-12 MED ORDER — HEPARIN BOLUS VIA INFUSION
4000.0000 [IU] | Freq: Once | INTRAVENOUS | Status: AC
  Administered 2020-12-12: 4000 [IU] via INTRAVENOUS

## 2020-12-12 MED ORDER — SODIUM CHLORIDE 0.9% FLUSH: 3.0000 mL | INTRAVENOUS | Status: DC | PRN

## 2020-12-12 MED ORDER — ASPIRIN 81 MG PO CHEW
81.0000 mg | CHEWABLE_TABLET | ORAL | Status: AC
Start: 2020-12-13 — End: 2020-12-13
  Administered 2020-12-13: 81 mg via ORAL
  Filled 2020-12-12: qty 1

## 2020-12-12 MED ORDER — DIAZEPAM 2 MG PO TABS
2.0000 mg | ORAL_TABLET | Freq: Four times a day (QID) | ORAL | Status: DC | PRN
  Administered 2020-12-13 – 2020-12-14 (×2): 2 mg via ORAL
  Filled 2020-12-12 (×2): qty 1

## 2020-12-12 MED ORDER — SODIUM CHLORIDE 0.9 % WEIGHT BASED INFUSION
3.0000 mL/kg/h | INTRAVENOUS | Status: AC
  Administered 2020-12-13: 3 mL/kg/h via INTRAVENOUS

## 2020-12-12 MED ORDER — HEPARIN (PORCINE) 25000 UT/250ML-% IV SOLN
1350.0000 [IU]/h | INTRAVENOUS | Status: DC
  Administered 2020-12-12: 1100 [IU]/h via INTRAVENOUS
  Administered 2020-12-12: 1250 [IU]/h via INTRAVENOUS
  Administered 2020-12-13: 1350 [IU]/h via INTRAVENOUS
  Filled 2020-12-12 (×3): qty 250

## 2020-12-12 MED ORDER — LORAZEPAM 2 MG/ML IJ SOLN
0.2500 mg | Freq: Once | INTRAMUSCULAR | Status: AC
  Administered 2020-12-13: 0.25 mg via INTRAVENOUS
  Filled 2020-12-12: qty 1

## 2020-12-12 MED ORDER — ONDANSETRON HCL 4 MG/2ML IJ SOLN: 4.0000 mg | Freq: Four times a day (QID) | INTRAMUSCULAR | Status: DC | PRN

## 2020-12-12 MED ORDER — ROSUVASTATIN CALCIUM 5 MG PO TABS
10.0000 mg | ORAL_TABLET | Freq: Every day | ORAL | Status: DC
  Administered 2020-12-13 – 2020-12-15 (×3): 10 mg via ORAL
  Filled 2020-12-12 (×4): qty 2

## 2020-12-12 MED ORDER — ACETAMINOPHEN 325 MG PO TABS
650.0000 mg | ORAL_TABLET | ORAL | Status: DC | PRN
  Administered 2020-12-13: 650 mg via ORAL
  Filled 2020-12-12: qty 2

## 2020-12-12 MED ORDER — LEVOTHYROXINE SODIUM 25 MCG PO TABS
125.0000 ug | ORAL_TABLET | Freq: Every day | ORAL | Status: DC
  Administered 2020-12-13 – 2020-12-15 (×3): 125 ug via ORAL
  Filled 2020-12-12 (×3): qty 1

## 2020-12-12 MED ORDER — METOPROLOL SUCCINATE ER 50 MG PO TB24
50.0000 mg | ORAL_TABLET | Freq: Every day | ORAL | Status: DC
  Administered 2020-12-12 – 2020-12-13 (×2): 50 mg via ORAL
  Filled 2020-12-12 (×2): qty 1

## 2020-12-12 MED ORDER — VITAMIN D 25 MCG (1000 UNIT) PO TABS
5000.0000 [IU] | ORAL_TABLET | Freq: Every day | ORAL | Status: DC
  Administered 2020-12-13 – 2020-12-15 (×3): 5000 [IU] via ORAL
  Filled 2020-12-12 (×3): qty 5

## 2020-12-12 MED ORDER — SODIUM CHLORIDE 0.9% FLUSH
3.0000 mL | Freq: Two times a day (BID) | INTRAVENOUS | Status: DC
  Administered 2020-12-13 – 2020-12-15 (×4): 3 mL via INTRAVENOUS

## 2020-12-12 NOTE — Progress Notes (Signed)
Critical troponin of 169 called by lab. Notified MD A. Wolfe.

## 2020-12-12 NOTE — ED Notes (Signed)
CRITICAL VALUE STICKER  CRITICAL VALUE:trop  RECEIVER (on-site recipient of call):Shermar Friedland,RN  DATE & TIME NOTIFIED: 12/12/2020 1028   MESSENGER (representative from lab):  MD NOTIFIED: dixon  TIME OF NOTIFICATION:12/12/2020 1029  RESPONSE: orders received

## 2020-12-12 NOTE — Progress Notes (Signed)
ANTICOAGULATION CONSULT NOTE - Initial Consult  Pharmacy Consult for Heparin Indication: chest pain/ACS  Allergies  Allergen Reactions   Adhesive [Tape] Other (See Comments)   Other Other (See Comments)    SOME OF THE "MYCINS"...UNKNOWN REACTION.   Latex Rash    Patient Measurements: Height: 5\' 9"  (175.3 cm) Weight: 86.2 kg (190 lb) IBW/kg (Calculated) : 66.2 Heparin Dosing Weight: 84 kg  Vital Signs: Temp: 98.3 F (36.8 C) (10/01 2303) BP: 154/68 (10/02 0000) Pulse Rate: 67 (10/02 0000)  Labs: Recent Labs    12/11/20 2313 12/12/20 0113  HGB 13.0  --   HCT 40.1  --   PLT 128*  --   CREATININE 0.77  --   TROPONINIHS 13 64*    Estimated Creatinine Clearance: 53.7 mL/min (by C-G formula based on SCr of 0.77 mg/dL).   Medical History: Past Medical History:  Diagnosis Date   Arthritis    Breast cancer ([place])    Right   Cancer ([place])    thyroid   GERD (gastroesophageal reflux disease)    GI bleed 1980   AFTER POLYP EXCISION   Heart disease, hypertensive    Hypercholesterolemia    Hypertension    Hypothyroidism    Incontinence of urine    Myalgia    Myocardial infarction (H[facility])    Obesity    Pain in joints    Retinal detachment    Unsteady gait    UTI (urinary tract infection)    Vision loss of left eye    Vomiting     Medications:  See electronic med rec  Assessment: 87 y.o. F presents with CP. No AC PTA. H/H ok on admission, plt 128. To begin heparin for ACS.  Goal of Therapy:  Heparin level 0.3-0.7 units/ml Monitor platelets by anticoagulation protocol: Yes   Plan:  Heparin IV bolus 4000 Heparin gtt at 1100 units/hr Will f/u heparin level in 8 hours Daily heparin level and CBC  Sherlon Handing, PharmD, BCPS Please see amion for complete clinical pharmacist phone list 12/12/2020,2:09 AM

## 2020-12-12 NOTE — Progress Notes (Addendum)
[place] for Heparin Indication: chest pain/ACS  Allergies  Allergen Reactions   Other Other (See Comments)    SOME OF THE "MYCINS"...UNKNOWN REACTION.   Adhesive [Tape] Rash   Latex Rash    Patient Measurements: Height: 5\' 9"  (175.3 cm) Weight: 86.2 kg (190 lb) IBW/kg (Calculated) : 66.2 Heparin Dosing Weight: 84 kg  Vital Signs: Temp: 98.5 F (36.9 C) (10/02 1925) Temp Source: Oral (10/02 1925) BP: 140/64 (10/02 1925) Pulse Rate: 80 (10/02 1925)  Labs: Recent Labs    12/11/20 2313 12/12/20 0113 12/12/20 0514 12/12/20 0925 12/12/20 1144 12/12/20 2220  HGB 13.0  --   --   --   --   --   HCT 40.1  --   --   --   --   --   PLT 128*  --   --   --   --   --   HEPARINUNFRC  --   --   --   --  0.26* 0.34  CREATININE 0.77  --   --   --  0.90  --   TROPONINIHS 13   < > 95* 168* 169*  --    < > = values in this interval not displayed.     Estimated Creatinine Clearance: 47.7 mL/min (by C-G formula based on SCr of 0.9 mg/dL).   Assessment: 87 y.o. F presents with CP. No AC PTA. H/H ok on admission, plt 128. To begin heparin for ACS.  Heparin level therapeutic (0.34) on gtt at 1250 units/hr. No bleeding noted.  Goal of Therapy:  Heparin level 0.3-0.7 units/ml Monitor platelets by anticoagulation protocol: Yes   Plan:  Continue heparin gtt at 1250 units/hr F/u daily heparin level and CBC  Sherlon Handing, PharmD, BCPS Please see amion for complete clinical pharmacist phone list 12/12/2020,11:22 PM

## 2020-12-12 NOTE — Consult Note (Signed)
Cardiology Consultation:   Patient ID: Kathy Murphy MRN: [id number]; DOB: 1927-04-03  Admit date: 12/11/2020 Date of Consult: 12/12/2020  PCP:  Kathy Bunting, MD   [facility] Providers Cardiologist:  Kathy Klein, MD        Patient Profile:   Kathy Murphy is a 87 y.o. female with a hx of CAD, chronic combined CHF (with normalization of LVEF), moderate dilation of aortic root, breast CA s/p lumpectomy, GERD, thyroid CA, HTN, HLD, hypothyroidism, obesity, unsteady gait/falls who is being seen 12/12/2020 for the evaluation of chest pain/possible NSTEMI at the request of Kathy Murphy.  History of Present Illness:   [name] is followed by Dr. Sallyanne Murphy. She established care in 2017 for chest pain. Nuclear stress test had shown no ischemia or infarction, but EF 43%. 2D echo 01/2016 showed EF 40-45%. There was some concern for balanced ischemia so cardiac cath was recommended. This was performed 01/2016 showing multivessel CAD with initial unsuccessful PCI of the RCA. She returned for planned CTO PCI 02/2016 and underwent 2 overlapping drug eluting stents to the RCA. Her high-grade stenoses in the diagonal artery, ramus intermedius branch, distal LAD were left for medical therapy. At last OV 05/2020, patient was troubled by urinary continence issues and some short-term memory issues. She was also noticing increased SOB. Repeat echo 07/2020 showed normalization of EF to 55-60%, grade 1 DD, moderate dilation of aortic root.  She returned to the hospital late yesterday evening with chest pain. She is accompanied by a family friend Kathy Murphy who vowed to look after her after her husband died. Kathy Murphy indicates the patient's short term memory is not great, but long term memory is excellent. She is able to provide majority of the history but is somewhat vague with dates and length of symptoms. A week ago she was preparing her house for visitors and doing more activity than usual. She was under a lot of  stress with this. She felt some shortness of breath with exertion. 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 She cannot really describe this further. In the following few days she began to have intermittent chest pains lasting "a right good while." She does not recall any particular triggers (exertion, palpation, inspiration, etc). Yesterday evening the pain worsened unlike anything she'd had before for several hours and notified Kathy Murphy - she told him it felt like an elephant was sitting on her chest with severe left arm pain. She also has history of shoulder problems but this was more severe than usual. She was SOB and nauseated. They came to the ER where she received ASA, Zofran, SL NTG, and started on IV heparin per pharmacy. The patient does not think SL NTG made a difference but Kathy Murphy states she started feeling better about 45 minutes' time. She remains chest pain free at this time but still queasy intermittently. Labs show hsTroponins [id number], mildly elevated WBC 11.4 and thromobcytopenia of 128, mildly elevated BUN 28, Cr 0.77, Covid negative. Vitals show initial elevated BP then 91/79 then most recent BP 122/81. CXR with left basilar atelectasis. She also reports several falls over the past few weeks, mainly mechanical (i.e. while mopping), but one episode she had leaned back, felt dizzy, and lost her balance at [employer] 2 months prior. She has not had any syncope or palpitations. CT head here showed moderate small vessel ischemic disease but otherwise no evidence of intracranial abnormality.   Past Medical History:  Diagnosis Date  Arthritis    Breast cancer ([place])    Right   CAD (coronary artery disease)    Cancer (HCC)    thyroid   Chronic combined systolic and diastolic CHF (congestive heart failure) (HCC)    Dilated aortic root (HCC)    First degree AV block    GERD (gastroesophageal reflux disease)    GI bleed 1980   AFTER  POLYP EXCISION   Heart disease, hypertensive    Hypercholesterolemia    Hypertension    Hypothyroidism    Incontinence of urine    Myalgia    Myocardial infarction ([place])    Obesity    Pain in joints    Retinal detachment    Unsteady gait    UTI (urinary tract infection)    Vision loss of left eye    Vomiting     Past Surgical History:  Procedure Laterality Date   ABDOMINAL HYSTERECTOMY  1977   BREAST BIOPSY     BREAST LUMPECTOMY     x5   CARDIAC CATHETERIZATION N/A 02/08/2016   Procedure: Left Heart Cath and Coronary Angiography;  Surgeon: Belva Crome, MD;  Location: [facility];  Service: Cardiovascular;  Laterality: N/A;   CARDIAC CATHETERIZATION N/A 02/11/2016   Procedure: Coronary Stent Intervention;  Surgeon: Belva Crome, MD;  Location: [place] [facility];  Service: Cardiovascular;  Laterality: N/A;   CATARACT EXTRACTION, BILATERAL     CERVICAL LAMINECTOMY     DENTAL SURGERY  1952   EVACUATION BREAST HEMATOMA Right 01/09/2017   Procedure: EVACUATION RIGHT MASTECTOMY FLUID COLLECTION;  Surgeon: Rolm Bookbinder, MD;  Location: [facility];  Service: General;  Laterality: Right;   [place]  2012   left   TOTAL KNEE ARTHROPLASTY Right 05/18/2017   Procedure: RIGHT TOTAL KNEE ARTHROPLASTY;  Surgeon: Netta Cedars, MD;  Location: [place];  Service: Orthopedics;  Laterality: Right;   TOTAL MASTECTOMY Right 11/27/2016   TOTAL MASTECTOMY Right 11/27/2016   Procedure: RIGHT TOTAL MASTECTOMY;  Surgeon: Rolm Bookbinder, MD;  Location: [facility];  Service: General;  Laterality: Right;     Home Medications:  Prior to Admission medications   Medication Sig Start Date End Date Taking? Authorizing Provider  acetaminophen (TYLENOL) 650 MG CR tablet Take 650 mg by mouth every 8 (eight) hours as needed for pain.   Yes [provider]  anastrozole (ARIMIDEX) 1 MG tablet TAKE 1 TABLET DAILY Patient taking  differently: Take 1 mg by mouth daily. 03/22/20  Yes Magrinat, Virgie Dad, MD  aspirin EC 81 MG tablet Take 81 mg by mouth daily.   Yes [provider]  Carboxymethylcellulose Sodium (REFRESH TEARS OP) Apply 1-2 drops to eye daily.   Yes [provider]  Cholecalciferol (VITAMIN D3) 125 MCG (5000 UT) CAPS Take 1 capsule by mouth daily.   Yes [provider]  diazepam (VALIUM) 2 MG tablet Take 2 mg by mouth every 6 (six) hours as needed for anxiety.   Yes [provider]  Diclofenac Sodium CR 100 MG 24 hr tablet Take 100 mg by mouth daily. 09/02/16  Yes [provider]  levothyroxine (SYNTHROID, LEVOTHROID) 125 MCG tablet Take 125 mcg by mouth daily before breakfast.   Yes [provider]  losartan (COZAAR) 100 MG tablet Take 100 mg by mouth daily.  03/30/11  Yes [provider]  metoprolol succinate (TOPROL-XL) 100 MG  24 hr tablet TAKE 1 TABLET DAILY Patient taking differently: Take 100 mg by mouth daily. 04/13/20  Yes Croitoru, Mihai, MD  pantoprazole (PROTONIX) 40 MG tablet Take 40 mg by mouth daily.   Yes [provider]  rosuvastatin (CRESTOR) 10 MG tablet Take 10 mg by mouth every morning.   Yes [provider]  meclizine (ANTIVERT) 12.5 MG tablet Take 1 tablet (12.5 mg total) by mouth 3 (three) times daily as needed for dizziness. Patient not taking: No sig reported 07/02/20   Fatima Blank, MD    Inpatient Medications: Scheduled Meds:  [START ON 12/13/2020] aspirin EC  81 mg Oral Daily   sodium chloride flush  3 mL Intravenous Q12H   Continuous Infusions:  sodium chloride     heparin 1,100 Units/hr (12/12/20 0222)   PRN Meds: sodium chloride, acetaminophen, morphine injection, nitroGLYCERIN, ondansetron (ZOFRAN) IV, sodium chloride flush  Allergies:    Allergies  Allergen Reactions   Other Other (See Comments)    SOME OF THE "MYCINS"...UNKNOWN REACTION.   Adhesive [Tape] Rash   Latex Rash     Social History:   Social History   Socioeconomic History   Marital status: Widowed    Spouse name: Not on file   Number of children: Not on file   Years of education: Not on file   Highest education level: Not on file  Occupational History   Not on file  Tobacco Use   Smoking status: Never   Smokeless tobacco: Never  Vaping Use   Vaping Use: Never used  Substance and Sexual Activity   Alcohol use: No   Drug use: No   Sexual activity: Not on file  Other Topics Concern   Not on file  Social History Narrative   Not on file   Social Determinants of Health   Financial Resource Strain: Not on file  Food Insecurity: Not on file  Transportation Needs: Not on file  Physical Activity: Not on file  Stress: Not on file  Social Connections: Not on file  Intimate Partner Violence: Not on file    Family History:   Family History  Problem Relation Age of Onset   Heart disease Mother      ROS:  Please see the history of present illness.  Denies fever or chills. Has chronic hot flashes. All other ROS reviewed and negative.     Physical Exam/Data:   Vitals:   12/12/20 0400 12/12/20 0730 12/12/20 0800 12/12/20 1147  BP: (!) 139/45 (!) 142/89 91/79 122/81  Pulse: 65 78 81 77  Resp: 18 18 18    Temp:    99.4 F (37.4 C)  TempSrc:    Oral  SpO2: 94% 96% 94% 98%  Weight:      Height:       No intake or output data in the 24 hours ending 12/12/20 1454 Last 3 Weights 12/11/2020 07/01/2020 06/09/2020  Weight (lbs) 190 lb 184 lb 184 lb  Weight (kg) 86.183 kg 83.462 kg 83.462 kg     Body mass index is 28.06 kg/m.  General: Well developed, well nourished WF, in no acute distress. Head: Normocephalic, atraumatic, sclera non-icteric, no xanthomas, nares are without discharge. Neck: Negative for carotid bruits. JVP not elevated. Lungs: Clear bilaterally to auscultation without wheezes, rales, or rhonchi. Breathing is unlabored. Heart: RRR S1 S2 without murmurs, rubs, or  gallops.  Abdomen: Soft, non-tender, non-distended with normoactive bowel sounds. No rebound/guarding. Extremities: No clubbing or cyanosis. No edema. Distal pedal pulses  are 2+ and equal bilaterally. Neuro: Alert and oriented X 3. Moves all extremities spontaneously. Psych:  Responds to questions appropriately with a normal affect.  EKG:  The EKG was personally reviewed and demonstrates:   NSR 78bpm, NSIVCD, first degree AVB, diffuse nonspecific STTW changes - these were persent in I, II, avL, V5-V6 previously but more significant ST depression/TW changes V5-V6 on this tracing  Telemetry:  Telemetry was personally reviewed and demonstrates:  NSR rare PVC  Relevant CV Studies: 2D echo 07/2020  1. Left ventricular ejection fraction, by estimation, is 55 to 60%. The  left ventricle has normal function. The left ventricle has no regional  wall motion abnormalities. There is moderate asymmetric left ventricular  hypertrophy of the basal-septal  segment. Left ventricular diastolic parameters are consistent with Grade I  diastolic dysfunction (impaired relaxation).   2. Right ventricular systolic function is normal. The right ventricular  size is normal. There is normal pulmonary artery systolic pressure. The  estimated right ventricular systolic pressure is 41.7 mmHg.   3. The mitral valve is grossly normal. Trivial mitral valve  regurgitation. No evidence of mitral stenosis.   4. The aortic valve is tricuspid. There is mild calcification of the  aortic valve. There is mild thickening of the aortic valve. Aortic valve  regurgitation is trivial. Mild to moderate aortic valve  sclerosis/calcification is present, without any  evidence of aortic stenosis.   5. Aortic dilatation noted. There is moderate dilatation of the aortic  root, measuring 43 mm.   6. The inferior vena cava is normal in size with greater than 50%  respiratory variability, suggesting right atrial pressure of 3 mmHg.    Comparison(s): Prior images unable to be directly viewed, comparison made  by report only. Changes from prior study are noted. LVEF 55-60% on this  study. Anterior WMA has resolved.    First  Cath 01/2016 Prox LAD to Mid LAD lesion, 50 %stenosed.   Significant coronary artery disease with subtotally occluded mid right coronary collateralized from the left coronary artery (LAD and circumflex). Severe ostial obstruction in the second diagonal, 75% stenosis in the first diagonal, 70% stenosis within a region of tortuosity in the ramus intermedius, and 50% narrowing in the proximal to mid LAD. The distal LAD is diffusely diseased without focal obstruction. Low normal LV function with EF estimated to be 50%. Failed PTCA of the mid right coronary due to poor guide catheter support.   RECOMMENDATION: Begin dual antiplatelet therapy. Re-attempt PCI of the right coronary from the femoral approach on 02/11/2016. Will discuss with CTO team as well. Intensify anti-ischemic therapy.   PCI 02/2016 Mid RCA chronic total occlusion collateralized from the left coronary artery. Successful overlapping stents from the distal RCA no the margin of the PDA back to the proximal RCA using two 38 mm long Synergy drug-eluting stents postdilated to 3.0 mm in diameter with 100% stenosis reduced to 0%. Grade 3 flow was noted. Angio-Seal was used for successful hemostasis.   RECOMMENDATIONS:   Discharge in a.m. on aspirin and Plavix. Ambulate later this evening    Laboratory Data:  High Sensitivity Troponin:   Recent Labs  Lab 12/12/20 0113 12/12/20 0329 12/12/20 0514 12/12/20 0925 12/12/20 1144  TROPONINIHS 64* 64* 95* 168* 169*     Chemistry Recent Labs  Lab 12/11/20 2313 12/12/20 1144  NA 139  --   K 3.5  --   CL 105  --   CO2 25  --   GLUCOSE 185*  --  BUN 28*  --   CREATININE 0.77  --   CALCIUM 8.9  --   MG  --  2.0  GFRNONAA >60  --   ANIONGAP 9  --     Recent Labs  Lab  12/11/20 2313  PROT 6.7  ALBUMIN 4.1  AST 15  ALT 11  ALKPHOS 83  BILITOT 0.9   Lipids No results for input(s): CHOL, TRIG, HDL, LABVLDL, LDLCALC, CHOLHDL in the last 168 hours.  Hematology Recent Labs  Lab 12/11/20 2313  WBC 11.4*  RBC 4.21  HGB 13.0  HCT 40.1  MCV 95.2  MCH 30.9  MCHC 32.4  RDW 13.2  PLT 128*   Thyroid No results for input(s): TSH, FREET4 in the last 168 hours.  BNPNo results for input(s): BNP, PROBNP in the last 168 hours.  DDimer No results for input(s): DDIMER in the last 168 hours.   Radiology/Studies:  CT HEAD WO CONTRAST (5MM)  Result Date: 12/12/2020 CLINICAL DATA:  Head trauma. EXAM: CT HEAD WITHOUT CONTRAST TECHNIQUE: Contiguous axial images were obtained from the base of the skull through the vertex without intravenous contrast. COMPARISON:  Head and neck CTA 07/02/2020 FINDINGS: Brain: There is no evidence of an acute infarct, intracranial hemorrhage, mass, midline shift, or extra-axial fluid collection. Mild cerebral atrophy is unchanged. Hypodensities in the cerebral white matter bilaterally are unchanged and nonspecific but compatible with moderate chronic small vessel ischemic disease. Vascular: Calcified atherosclerosis at the skull base. No hyperdense vessel. Skull: No fracture or suspicious osseous lesion. Sinuses/Orbits: Paranasal sinuses and mastoid air cells are clear. Bilateral cataract extraction. Other: None. IMPRESSION: 1. No evidence of acute intracranial abnormality. 2. Moderate chronic small vessel ischemic disease. Electronically Signed   By: Logan Bores M.D.   On: 12/12/2020 14:24   DG Chest Portable 1 View  Result Date: 12/11/2020 CLINICAL DATA:  Chest pain. EXAM: PORTABLE CHEST 1 VIEW COMPARISON:  CT chest 09/09/2010. FINDINGS: Surgical clips overlie the lower neck and right axilla. Aorta is tortuous. The heart is borderline enlarged. There is prominence of the right upper paratracheal region corresponding to ectatic vasculature  on CT. There is no focal lung consolidation, pleural effusion or pneumothorax. There is minimal left basilar atelectasis. No acute fractures are seen. IMPRESSION: Left basilar atelectasis. Electronically Signed   By: Ronney Asters M.D.   On: 12/11/2020 23:49     Assessment and Plan:   1. Chest pain suspicious for USA/NSTEMI - hsTroponins checked with peak of 169, EKG with accentuation of prior STTW changes - certainly has the substrate for angina in the setting of known multivessel CAD from prior cath - agree with ASA, heparin, statin - primary team reducing Toprol and holding losartan due to softer BP - 2D echo planned - will discuss further management with MD - although advanced age and falls makes invasive evaluation less ideal, her symptoms do sound possibly anginal in nature so question is pursuit of interventional management versus medical management  2. CAD s/p prior PCI as above, HLD goal LDL <70 - continue ASA, BB, rosuvastatin - lipids in AM  3. Chronic combined CHF - resolved LV dysfunction by last echo 07/2020, has not required standing diuretic previously - volume status looks OK  4. Moderate dilation of aortic root - repeat echo pending  Other findings as noted, per primary team - mild leukocytosis, thrombocytopenia - falling - chronic thyroid disease  Risk Assessment/Risk Scores:     TIMI Risk Score for Unstable Angina or Non-ST Elevation  MI:   The patient's TIMI risk score is  , which indicates a  % risk of all cause mortality, new or recurrent myocardial infarction or need for urgent revascularization in the next 14 days.  New York Heart Association (NYHA) Functional Class 2-3    For questions or updates, please contact [facility] Please consult [email or web address] for contact info under    Signed, Charlie Pitter, PA-C  12/12/2020 2:54 PM

## 2020-12-12 NOTE — Plan of Care (Signed)
  Problem: Education: Goal: Knowledge of General Education information will improve Description Including pain rating scale, medication(s)/side effects and non-pharmacologic comfort measures Outcome: Progressing   

## 2020-12-12 NOTE — ED Notes (Signed)
Kathy Murphy: Engineer, maintenance Son of Patient  [phone]

## 2020-12-12 NOTE — ED Notes (Signed)
Report called to cara hassleck,rn on [place].

## 2020-12-12 NOTE — H&P (Addendum)
History and Physical    Kathy Murphy HKV:[id number] DOB: 07/31/1927 DOA: 12/11/2020  PCP: Burnard Bunting, MD Consultants:  cardiology: Dr. Sallyanne Kuster, oncology: Dr. Jana Hakim,  Patient coming from: [facility] - lives alone, but her caretaker lives right down road and helps.   Chief Complaint: chest pain   HPI: Kathy Murphy is a 87 y.o. female with medical history significant of hyperlipidemia, hypertension, CHF combined, GERD, hypothyroidism, CAD with history of 2 overlapping DES in the subtotally occluded RCA and high-grade stenosis in the diagonal artery, ramus intermedius branch, distal LAD which were left for medical therapy, and history of estrogen receptor positive breast cancer of right breast in 2018 who presented to the ED with chest pain. She states hte pain started a week ago and it got worse to the point she has never experienced anything like this before. She had some associated nausea and shortness of breath at this time. She had cleaned her house and got awful winded. Pain was substernal and described as heavy on her chest with radiation to her left shoulder. She states the pain has been constant, but just varies in intensity. Tends to get worse with exertion. Pain rated as an 8-9/10. It did scare her, but she waited a week until she called her caretaker. She called him last night and had excruciating chest pain. She was short of breath.   She has had 3+ falls recently where she loses her balance and tends to fall backwards. When she stands up now she feels like she is falling backwards. She has had a number of spells over her lifetime of this off balance feeling. Her last fall was a week ago where she fell backwards onto her head. She can't recall if it was at the same time as her chest pain. She denies any vision changes/headaches/neuro deficits. Her caregiver states she had some mild confusion last night, but not right after the fall.   Denies any recent fever/chills, N/V/D,  stomach pain, leg swelling, coughing, vision changes, headaches, dysuria/urgency or frequency.    ED Course: Vitals: Afebrile, blood pressure 158/68, heart rate 77, respiratory rate 17, oxygen 96% on room air. Pertinent labs: WBC 11.4, platelets 128, BUN 28, initial troponin 13>64>64>95>168,   Chest x-ray shows minimal left basilar atelectasis. Given aspirin nitroglycerin and started on a heparin drip.  Audiology was consulted by EDP and we were asked to admit.  Review of Systems: As per HPI; otherwise review of systems reviewed and negative.   Ambulatory Status:  Ambulates with cane and walker     Past Medical History:  Diagnosis Date   Arthritis    Breast cancer ([place])    Right   Cancer ([place])    thyroid   GERD (gastroesophageal reflux disease)    GI bleed 1980   AFTER POLYP EXCISION   Heart disease, hypertensive    Hypercholesterolemia    Hypertension    Hypothyroidism    Incontinence of urine    Myalgia    Myocardial infarction ([place])    Obesity    Pain in joints    Retinal detachment    Unsteady gait    UTI (urinary tract infection)    Vision loss of left eye    Vomiting     Past Surgical History:  Procedure Laterality Date   ABDOMINAL HYSTERECTOMY  1977   BREAST BIOPSY     BREAST LUMPECTOMY     x5   CARDIAC CATHETERIZATION N/A 02/08/2016   Procedure: Left Heart  Cath and Coronary Angiography;  Surgeon: Belva Crome, MD;  Location: [place] [facility];  Service: Cardiovascular;  Laterality: N/A;   CARDIAC CATHETERIZATION N/A 02/11/2016   Procedure: Coronary Stent Intervention;  Surgeon: Belva Crome, MD;  Location: [place] [facility];  Service: Cardiovascular;  Laterality: N/A;   CATARACT EXTRACTION, BILATERAL     CERVICAL LAMINECTOMY     DENTAL SURGERY  1952   EVACUATION BREAST HEMATOMA Right 01/09/2017   Procedure: EVACUATION RIGHT MASTECTOMY FLUID COLLECTION;  Surgeon: Rolm Bookbinder, MD;  Location: [place];  Service: General;  Laterality: Right;   [place]  2012   left   TOTAL KNEE ARTHROPLASTY Right 05/18/2017   Procedure: RIGHT TOTAL KNEE ARTHROPLASTY;  Surgeon: Netta Cedars, MD;  Location: [place];  Service: Orthopedics;  Laterality: Right;   TOTAL MASTECTOMY Right 11/27/2016   TOTAL MASTECTOMY Right 11/27/2016   Procedure: RIGHT TOTAL MASTECTOMY;  Surgeon: Rolm Bookbinder, MD;  Location: [place];  Service: General;  Laterality: Right;    Social History   Socioeconomic History   Marital status: Widowed    Spouse name: Not on file   Number of children: Not on file   Years of education: Not on file   Highest education level: Not on file  Occupational History   Not on file  Tobacco Use   Smoking status: Never   Smokeless tobacco: Never  Vaping Use   Vaping Use: Never used  Substance and Sexual Activity   Alcohol use: No   Drug use: No   Sexual activity: Not on file  Other Topics Concern   Not on file  Social History Narrative   Not on file   Social Determinants of Health   Financial Resource Strain: Not on file  Food Insecurity: Not on file  Transportation Needs: Not on file  Physical Activity: Not on file  Stress: Not on file  Social Connections: Not on file  Intimate Partner Violence: Not on file    Allergies  Allergen Reactions   Other Other (See Comments)    SOME OF THE "MYCINS"...UNKNOWN REACTION.   Adhesive [Tape] Rash   Latex Rash    Family History  Problem Relation Age of Onset   Heart disease Mother     Prior to Admission medications   Medication Sig Start Date End Date Taking? Authorizing Provider  acetaminophen (TYLENOL) 650 MG CR tablet Take 650 mg by mouth every 8 (eight) hours as needed for pain.   Yes [provider]  anastrozole (ARIMIDEX) 1 MG tablet TAKE 1 TABLET DAILY Patient taking differently: Take 1 mg by mouth daily. 03/22/20  Yes Magrinat, Virgie Dad, MD  aspirin EC 81 MG tablet Take 81 mg by mouth daily.    Yes [provider]  Carboxymethylcellulose Sodium (REFRESH TEARS OP) Apply 1-2 drops to eye daily.   Yes [provider]  Cholecalciferol (VITAMIN D3) 125 MCG (5000 UT) CAPS Take 1 capsule by mouth daily.   Yes [provider]  diazepam (VALIUM) 2 MG tablet Take 2 mg by mouth every 6 (six) hours as needed for anxiety.   Yes [provider]  Diclofenac Sodium CR 100 MG 24 hr tablet Take 100 mg by mouth daily. 09/02/16  Yes [provider]  levothyroxine (SYNTHROID, LEVOTHROID) 125 MCG tablet Take 125 mcg by mouth daily before breakfast.   Yes [provider]  losartan (COZAAR) 100 MG tablet Take 100 mg by mouth daily.  03/30/11  Yes [provider]  metoprolol succinate (TOPROL-XL) 100 MG 24 hr tablet TAKE 1 TABLET DAILY Patient taking differently: Take 100 mg by mouth daily. 04/13/20  Yes Croitoru, Mihai, MD  pantoprazole (PROTONIX) 40 MG tablet Take 40 mg by mouth daily.   Yes [provider]  rosuvastatin (CRESTOR) 10 MG tablet Take 10 mg by mouth every morning.   Yes [provider]  meclizine (ANTIVERT) 12.5 MG tablet Take 1 tablet (12.5 mg total) by mouth 3 (three) times daily as needed for dizziness. Patient not taking: No sig reported 07/02/20   Fatima Blank, MD    Physical Exam: Vitals:   12/12/20 0400 12/12/20 0730 12/12/20 0800 12/12/20 1147  BP: (!) 139/45 (!) 142/89 91/79 122/81  Pulse: 65 78 81 77  Resp: 18 18 18    Temp:    99.4 F (37.4 C)  TempSrc:    Oral  SpO2: 94% 96% 94% 98%  Weight:      Height:         General:  Appears calm and comfortable and is in NAD Eyes:  PERRL, EOMI, normal lids, iris ENT:  grossly normal hearing, lips & tongue, mmm; appropriate dentition Neck:  no LAD, masses or thyromegaly; no carotid bruits Cardiovascular:  RRR, no m/r/g. No LE edema.  Respiratory:   CTA bilaterally with no wheezes/rales/rhonchi.  Normal respiratory effort. Abdomen:  soft, NT,  ND, NABS Back:   normal alignment, no CVAT Skin:  no rash or induration seen on limited exam. Bruising on lower back and right lateral abdomen.  Musculoskeletal:  grossly normal tone BUE/BLE, good ROM, no bony abnormality Lower extremity:  No LE edema.  Limited foot exam with no ulcerations.  2+ distal pulses. Psychiatric:  grossly normal mood and affect, speech fluent and appropriate, AOx3 Neurologic:  CN 2-12 grossly intact, moves all extremities in coordinated fashion, sensation intact    Radiological Exams on Admission: Independently reviewed - see discussion in A/P where applicable  DG Chest Portable 1 View  Result Date: 12/11/2020 CLINICAL DATA:  Chest pain. EXAM: PORTABLE CHEST 1 VIEW COMPARISON:  CT chest 09/09/2010. FINDINGS: Surgical clips overlie the lower neck and right axilla. Aorta is tortuous. The heart is borderline enlarged. There is prominence of the right upper paratracheal region corresponding to ectatic vasculature on CT. There is no focal lung consolidation, pleural effusion or pneumothorax. There is minimal left basilar atelectasis. No acute fractures are seen. IMPRESSION: Left basilar atelectasis. Electronically Signed   By: Ronney Asters M.D.   On: 12/11/2020 23:49    EKG: Independently reviewed.  NSR with rate 78; nonspecific ST changes with no evidence of acute ischemia. ST depression more pronounced in anterior/inferior leads than on previous ekg.    Labs on Admission: I have personally reviewed the available labs and imaging studies at the time of the admission.  Pertinent labs:  WBC 11.4,  platelets 128,  BUN 28,  initial troponin 13>64>64>95>168,      Assessment/Plan Principal Problem:   NSTEMI (non-ST elevated myocardial infarction) [facility])  -87 year-old female with known CAD presenting with chest pain and elevated troponin level. history of 2 overlapping DES in the subtotally occluded RCA and high-grade stenosis in the diagonal artery, ramus  intermedius branch, distal LAD which were left for medical therapy -Admit to cardiac telemetry -Continue heparin GTT -Continue nitroglycerin as needed -Continue beta-blocker and aspirin. Decrease toprol down to 50mg  due  to softer BP.  -morphine as needed for severe pain -Echocardiogram ordered -repeat fasting lipid panel in AM. Last one in 2019 with LDL of 57, continue statin  -Cardiology has been called consulted and will follow up on recommendations  Active Problems: Frequent falls/ambulatory dysfunction -recent fall back onto head with 3 falls in failry short time. Has feeling of unsteadiness/off balance Will check head CT, although do not think she has a brain bleed with no headache/vision changes or neuro deficits -PT to eval/tx    Thrombocytopenia (HCC) No history of thrombocytopenia. Lab error vs. Transient vs. Pathological Continue to trend, persistently low would w/u outpatient    Leukocytosis Likely reactive/hemoconcentrated. No fever or signs of infections Trend cbc, follow fever curve     CHF (congestive heart failure), NYHA class I, chronic, combined ([place]) Appears euvolemic  Watch intake and output and daily weights Continue medical management with beta-blocker and ARB Last echo March 2022: EF of 55 to 60% with normal left ventricular function.  Moderate asymmetric left ventricular hypertrophy of the basal septal segment.  Grade 1 diastolic dysfunction. Repeat echo pending in setting of NSTEMI    Hypertension Soft BP. Hold home losartan and decrease toprol XL down to 50mg . Monitor and adjust as needed.      Hypercholesterolemia Continue Crestor 10 mg daily    Hypothyroidism Continue Synthroid Check tSH     GERD (gastroesophageal reflux disease) Continue Protonix  Estrogen positive breast cancer Continue arimidex     Body mass index is 28.06 kg/m.    Level of care: Telemetry Cardiac DVT prophylaxis:  heparin gtt  Code Status:  Full - confirmed with  patient Family Communication: caretaker present at bedside: Shorty Sizemore Disposition Plan:  The patient is from: home  Anticipated d/c is to: home per  Requires inpatient hospitalization and is at significant risk of worsening, requires constant monitoring, assessment and MDM with specialists.  Patient is currently: stable Consults called: cardiology  Admission status:  observation  Dragon dictation used in completing this note.    Orma Flaming MD [facility]   How to contact the [facility] Attending or Consulting provider [place] or covering provider during after hours [place], for this patient?  Check the care team in [facility] and look for a) attending/consulting [facility] provider listed and b) the [facility] team listed Log into [email or web address] and use [place]'s universal password to access. If you do not have the password, please contact the hospital operator. Locate the [facility] provider you are looking for under [facility] and page to a number that you can be directly reached. If you still have difficulty reaching the provider, please page the [facility] (Director on Call) for the Hospitalists listed on amion for assistance.   12/12/2020, 12:49 PM

## 2020-12-12 NOTE — ED Notes (Signed)
Patient denies pain and is resting comfortably.  

## 2020-12-12 NOTE — Progress Notes (Signed)
PT Cancellation Note  Patient Details Name: Kathy Murphy MRN: [id number] DOB: 1927/04/27   Cancelled Treatment:    Reason Eval/Treat Not Completed: Patient not medically ready. Pt with elevated troponin and chest pain. Pt received heparin at 02:09 am 10/2 with heparin level being at 0.26 (subtherapeutic) at 11:44 am 10/2. Contacted Cardio PA with her expressing desire to hold off on PT Eval today pending a disposition as pt may need heart cath. Will plan to follow-up tomorrow.    Moishe Spice, PT, DPT Acute Rehabilitation Services  Pager: [phone] Office: [place] 12/12/2020, 3:43 PM

## 2020-12-13 ENCOUNTER — Inpatient Hospital Stay (HOSPITAL_COMMUNITY): Payer: Medicare Other

## 2020-12-13 ENCOUNTER — Encounter (HOSPITAL_COMMUNITY)
Admission: EM | Disposition: A | Discharge status: Home Health | Payer: Self-pay | Source: Home / Self Care | Attending: Student

## 2020-12-13 DIAGNOSIS — G9341 Metabolic encephalopathy: Secondary | ICD-10-CM

## 2020-12-13 DIAGNOSIS — R296 Repeated falls: Secondary | ICD-10-CM

## 2020-12-13 DIAGNOSIS — I25110 Atherosclerotic heart disease of native coronary artery with unstable angina pectoris: Secondary | ICD-10-CM

## 2020-12-13 DIAGNOSIS — E7800 Pure hypercholesterolemia, unspecified: Secondary | ICD-10-CM | POA: Diagnosis not present

## 2020-12-13 DIAGNOSIS — R319 Hematuria, unspecified: Secondary | ICD-10-CM

## 2020-12-13 DIAGNOSIS — I4430 Unspecified atrioventricular block: Secondary | ICD-10-CM

## 2020-12-13 DIAGNOSIS — I10 Essential (primary) hypertension: Secondary | ICD-10-CM | POA: Diagnosis not present

## 2020-12-13 DIAGNOSIS — I214 Non-ST elevation (NSTEMI) myocardial infarction: Secondary | ICD-10-CM | POA: Diagnosis not present

## 2020-12-13 DIAGNOSIS — N390 Urinary tract infection, site not specified: Secondary | ICD-10-CM

## 2020-12-13 DIAGNOSIS — D696 Thrombocytopenia, unspecified: Secondary | ICD-10-CM

## 2020-12-13 DIAGNOSIS — D72825 Bandemia: Secondary | ICD-10-CM

## 2020-12-13 LAB — CBC
HCT: 36.4 % (ref 36.0–46.0)
Hemoglobin: 12 g/dL (ref 12.0–15.0)
MCH: 31.4 pg (ref 26.0–34.0)
MCHC: 33 g/dL (ref 30.0–36.0)
MCV: 95.3 fL (ref 80.0–100.0)
Platelets: 104 10*3/uL — ABNORMAL LOW (ref 150–400)
RBC: 3.82 MIL/uL — ABNORMAL LOW (ref 3.87–5.11)
RDW: 13.2 % (ref 11.5–15.5)
WBC: 12.8 10*3/uL — ABNORMAL HIGH (ref 4.0–10.5)
nRBC: 0 % (ref 0.0–0.2)

## 2020-12-13 LAB — BASIC METABOLIC PANEL
Anion gap: 9 (ref 5–15)
BUN: 21 mg/dL (ref 8–23)
CO2: 24 mmol/L (ref 22–32)
Calcium: 8.9 mg/dL (ref 8.9–10.3)
Chloride: 105 mmol/L (ref 98–111)
Creatinine, Ser: 0.84 mg/dL (ref 0.44–1.00)
GFR, Estimated: >60 mL/min (ref >60)
Glucose, Bld: 137 mg/dL — ABNORMAL HIGH (ref 70–99)
Potassium: 4.2 mmol/L (ref 3.5–5.1)
Sodium: 138 mmol/L (ref 135–145)

## 2020-12-13 LAB — LIPID PANEL
Cholesterol: 82 mg/dL (ref 0–200)
HDL: 40 mg/dL — ABNORMAL LOW (ref >40)
LDL Cholesterol: 36 mg/dL (ref 0–99)
Total CHOL/HDL Ratio: 2.1 RATIO
Triglycerides: 31 mg/dL (ref <150)
VLDL: 6 mg/dL (ref 0–40)

## 2020-12-13 LAB — MAGNESIUM: Magnesium: 1.9 mg/dL (ref 1.7–2.4)

## 2020-12-13 LAB — HEPARIN LEVEL (UNFRACTIONATED): Heparin Unfractionated: 0.19 IU/mL — ABNORMAL LOW (ref 0.30–0.70)

## 2020-12-13 SURGERY — LEFT HEART CATH AND CORONARY ANGIOGRAPHY
Anesthesia: LOCAL

## 2020-12-13 MED ORDER — LIDOCAINE HCL (PF) 1 % IJ SOLN
INTRAMUSCULAR | Status: AC
  Filled 2020-12-13: qty 30

## 2020-12-13 MED ORDER — HEPARIN (PORCINE) IN NACL 1000-0.9 UT/500ML-% IV SOLN
INTRAVENOUS | Status: AC
  Filled 2020-12-13: qty 1000

## 2020-12-13 MED ORDER — CEFTRIAXONE SODIUM 1 G IJ SOLR
1.0000 g | INTRAMUSCULAR | Status: DC
  Administered 2020-12-13 – 2020-12-15 (×3): 1 g via INTRAVENOUS
  Filled 2020-12-13 (×3): qty 10

## 2020-12-13 MED ORDER — METOPROLOL SUCCINATE ER 100 MG PO TB24
100.0000 mg | ORAL_TABLET | Freq: Every day | ORAL | Status: DC
  Administered 2020-12-14 – 2020-12-15 (×2): 100 mg via ORAL
  Filled 2020-12-13 (×2): qty 1

## 2020-12-13 MED ORDER — GABAPENTIN 100 MG PO CAPS
200.0000 mg | ORAL_CAPSULE | Freq: Every day | ORAL | Status: DC
  Administered 2020-12-13 – 2020-12-14 (×2): 200 mg via ORAL
  Filled 2020-12-13 (×2): qty 2

## 2020-12-13 NOTE — Progress Notes (Addendum)
ANTICOAGULATION CONSULT NOTE - Follow Up Consult  Pharmacy Consult for Heparin Indication: chest pain/ACS  Allergies  Allergen Reactions   Other Other (See Comments)    SOME OF THE "MYCINS"...UNKNOWN REACTION.   Adhesive [Tape] Rash   Latex Rash    Patient Measurements: Height: 5\' 9"  (175.3 cm) Weight: 86.2 kg (190 lb) IBW/kg (Calculated) : 66.2 Heparin Dosing Weight: 84 kg  Vital Signs: Temp: 100.2 F (37.9 C) (10/03 0733) Temp Source: Oral (10/03 0733) BP: 158/61 (10/03 0733) Pulse Rate: 81 (10/03 0743)  Labs: Recent Labs    12/11/20 2313 12/12/20 0113 12/12/20 0514 12/12/20 0925 12/12/20 1144 12/12/20 2220 12/13/20 0042 12/13/20 0536 12/13/20 1247  HGB 13.0  --   --   --   --   --  ACCURACY OF RESULTS QUESTIONABLE. RECOMMEND RECOLLECT TO VERIFY. 12.0  --   HCT 40.1  --   --   --   --   --  ACCURACY OF RESULTS QUESTIONABLE. RECOMMEND RECOLLECT TO VERIFY. 36.4  --   PLT 128*  --   --   --   --   --  ACCURACY OF RESULTS QUESTIONABLE. RECOMMEND RECOLLECT TO VERIFY. 104*  --   HEPARINUNFRC  --   --   --   --  0.26* 0.34  --   --  0.19*  CREATININE 0.77  --   --   --  0.90  --   --  0.84  --   TROPONINIHS 13   < > 95* 168* 169*  --   --   --   --    < > = values in this interval not displayed.    Estimated Creatinine Clearance: 49.4 mL/min (by C-G formula based on SCr of 0.84 mg/dL).  Assessment: 87 y.o. F presented 12/11/20 pm with CP. No AC PTA. H/H ok on admission, platelets 128. Pharmacy consulted for heparin dosing for ACS.  Heparin level low therapeutic (0.34) last night on 1250 units/hr.  RN reported overnight that patient had pulled on IV lines and drip was not running for about 2 hours.  Resumed at 2:30am and running since that time.    Heparin level is now subtherapeutic (0.19) on 1250 units/hr. Platelet count 128 > 104.  No bleeding reported.  For cardiac cath today.  Goal of Therapy:  Heparin level 0.3-0.7 units/ml Monitor platelets by  anticoagulation protocol: Yes   Plan:  Increase heparin drip to 1350 units/hr Going for cath this afternoon, so didn't order f/u level. Will follow up post-cath. Daily heparin level and CBC while on heparin.  Arty Baumgartner, RPh 12/13/2020,2:32 PM

## 2020-12-13 NOTE — Progress Notes (Signed)
PROGRESS NOTE  Kathy Murphy PFX:[id number] DOB: 1928/03/05   PCP: Burnard Bunting, MD  Patient is from: Home.  Lives alone.  Independently ambulates at baseline.  DOA: 12/11/2020 LOS: 1  Chief complaints:  Chief Complaint  Patient presents with   Chest Pain     Brief Narrative / Interim history: 87 year old F with PMH of combined CHF, CAD s/p DES, breast cancer in 2018, HTN, HLD and urinary incontinence presenting with progressive typical chest pain for 1 week with associated dyspnea and nausea, and admitted for non-STEMI.  Troponin trended from 13-169.  She was a started on IV heparin, BB, aspirin.  Cardiology consulted, and planning LHC.  TTE with LVEF of 55 to 60%, R WMA,  G1-DD and RVSP of 48.4 mmHg.  Patient had an episode of encephalopathy with confusion and combativeness overnight.  UA concerning for UTI.  Urine culture obtained and she was started on ceftriaxone.  Subjective: Seen and examined earlier this morning.  Appears calm this morning.  Complains pain in her legs.  No further chest pain, shortness of breath or focal neuro symptoms.  She denies GI or UTI symptoms either.  She is awake and oriented x4.  No focal neurodeficit but seems to be restless  Objective: Vitals:   12/13/20 0408 12/13/20 0500 12/13/20 0733 12/13/20 0743  BP: (!) 160/62  (!) 158/61   Pulse: 83  81 81  Resp:   (!) 21 18  Temp:  98.9 F (37.2 C) 100.2 F (37.9 C)   TempSrc:  Oral Oral   SpO2:  93% 90% 93%  Weight: 80.3 kg     Height:        Intake/Output Summary (Last 24 hours) at 12/13/2020 1330 Last data filed at 12/13/2020 0400 Gross per 24 hour  Intake 1112.59 ml  Output 600 ml  Net 512.59 ml   Filed Weights   12/11/20 2301 12/13/20 0408  Weight: 86.2 kg 80.3 kg    Examination:  GENERAL: No apparent distress.  Nontoxic. HEENT: MMM.  Vision and hearing grossly intact.  NECK: Supple.  No apparent JVD.  RESP: 93% on RA.  No IWOB.  Fair aeration bilaterally. CVS:  RRR. Heart  sounds normal.  ABD/GI/GU: BS+. Abd soft, NTND.  MSK/EXT:  Moves extremities. No apparent deformity. No edema.  SKIN: no apparent skin lesion or wound NEURO: Awake, alert and oriented x4.  No apparent focal neuro deficit but looks restless.Marland Kitchen PSYCH: Calm. Normal affect.   Procedures:  None  Microbiology summarized: [id number] and influenza PCR nonreactive. Urine culture pending.  Assessment & Plan: Typical chest pain/Non-STEMI-history of CAD with DES stent.  Troponin trended from 13-169.  EKG on 10/1 with first-degree AVB and downslanting ST depression in lateral leads which seems to be new.  Repeat EKG this morning with first-degree AV block but no acute ischemic finding.  TTE TTE with LVEF of 55 to 60%, R WMA,  G1-DD and RVSP of 48.4 mmHg.  LDL 36.  Currently chest pain-free. -Continue IV heparin per cardiology -Continue aspirin and as needed nitroglycerin -Would be cautious with beta-blocker given if first degree AVB but defer this to cardiology -Plan for [facility] today   Frequent falls/ambulatory dysfunction-reportedly had 3 falls recently.  No signs of injury.  CT head without acute finding.  No focal neurodeficit but seems to be restless.  Lives alone. -PT/OT eval  Chronic combined CHF with recovered EF: TTE as above.  Appears euvolemic.  Not on diuretics at home. -Monitor fluid status  Possible  UTI: UA with nitrite, large LE and many bacteria.  Has history of incontinence but she denies dysuria,, suprapubic tenderness, new back pain, fever... She has leukocytosis and encephalopathy overnight. -Continue IV ceftriaxone -Follow urine culture  Acute metabolic encephalopathy-could be due to UTI or delirium.  She is takes Valium 2 mg every 6 hours as needed.  She had 1 dose last night.  Seems to have resolved. -Reorientation and delirium precautions -Treat treatable causes -Consider cutting down on Valium moving forward  Essential hypertension: -Cardiac meds as above.    Hypothyroidism: TSH within normal. -Continue home Synthroid   Mild leukocytosis: Could be infectious or reactive. -Continue monitoring  Thrombocytopenia: Slightly lower.  Could be due to gram-negative infection/UTI. -Continue monitoring  GERD -Continue Protonix   Estrogen positive breast cancer Continue arimidex    Bilateral lower extremity pain-symptoms of restless leg -Tylenol as needed -May try low-dose gabapentin tonight.  Body mass index is 26.14 kg/m.         DVT prophylaxis:    On IV heparin for non-STEMI  Code Status: Full code Family Communication: Patient has no family.  Updated her good friend from church at bedside. Level of care: Telemetry Cardiac Status is: Inpatient  Remains inpatient appropriate because:Ongoing diagnostic testing needed not appropriate for outpatient work up, IV treatments appropriate due to intensity of illness or inability to take PO, and Inpatient level of care appropriate due to severity of illness  Dispo: The patient is from: Home              Anticipated d/c is to:  To be determined              Patient currently is not medically stable to d/c.   Difficult to place patient No       Consultants:  Cardiology   Sch Meds:  Scheduled Meds:  anastrozole  1 mg Oral Daily   aspirin EC  81 mg Oral Daily   cholecalciferol  5,000 Units Oral Daily   levothyroxine  125 mcg Oral Q0600   [START ON 12/14/2020] metoprolol succinate  100 mg Oral Daily   pantoprazole  40 mg Oral Daily   polyvinyl alcohol  1 drop Both Eyes Daily   rosuvastatin  10 mg Oral Daily   sodium chloride flush  3 mL Intravenous Q12H   sodium chloride flush  3 mL Intravenous Q12H   Continuous Infusions:  sodium chloride     sodium chloride     sodium chloride 1 mL/kg/hr (12/13/20 0545)   cefTRIAXone (ROCEPHIN)  IV 1 g (12/13/20 0602)   heparin 1,250 Units/hr (12/13/20 0230)   PRN Meds:.sodium chloride, sodium chloride, acetaminophen, diazepam, morphine  injection, nitroGLYCERIN, ondansetron (ZOFRAN) IV, sodium chloride flush, sodium chloride flush  Antimicrobials: Anti-infectives (From admission, onward)    Start     Dose/Rate Route Frequency Ordered Stop   12/13/20 0500  cefTRIAXone (ROCEPHIN) 1 g in sodium chloride 0.9 % 100 mL IVPB        1 g 200 mL/hr over 30 Minutes Intravenous Every 24 hours 12/13/20 0429          I have personally reviewed the following labs and images: CBC: Recent Labs  Lab 12/11/20 2313 12/13/20 0042 12/13/20 0536  WBC 11.4* ACCURACY OF RESULTS QUESTIONABLE. RECOMMEND RECOLLECT TO VERIFY. 12.8*  HGB 13.0 ACCURACY OF RESULTS QUESTIONABLE. RECOMMEND RECOLLECT TO VERIFY. 12.0  HCT 40.1 ACCURACY OF RESULTS QUESTIONABLE. RECOMMEND RECOLLECT TO VERIFY. 36.4  MCV 95.2 ACCURACY OF RESULTS QUESTIONABLE. RECOMMEND RECOLLECT  TO VERIFY. 95.3  PLT 128* ACCURACY OF RESULTS QUESTIONABLE. RECOMMEND RECOLLECT TO VERIFY. 104*   BMP &GFR Recent Labs  Lab 12/11/20 2313 12/12/20 1144 12/13/20 0536 12/13/20 0943  NA 139 140 138  --   K 3.5 3.6 4.2  --   CL 105 108 105  --   CO2 25 20* 24  --   GLUCOSE 185* 143* 137*  --   BUN 28* 22 21  --   CREATININE 0.77 0.90 0.84  --   CALCIUM 8.9 8.9 8.9  --   MG  --  2.0  --  1.9   Estimated Creatinine Clearance: 49.4 mL/min (by C-G formula based on SCr of 0.84 mg/dL). Liver & Pancreas: Recent Labs  Lab 12/11/20 2313  AST 15  ALT 11  ALKPHOS 83  BILITOT 0.9  PROT 6.7  ALBUMIN 4.1   Recent Labs  Lab 12/11/20 2313  LIPASE 30   No results for input(s): AMMONIA in the last 168 hours. Diabetic: Recent Labs    12/12/20 1144  HGBA1C 5.5   Recent Labs  Lab 12/11/20 2334  GLUCAP 126*   Cardiac Enzymes: No results for input(s): CKTOTAL, CKMB, CKMBINDEX, TROPONINI in the last 168 hours. No results for input(s): PROBNP in the last 8760 hours. Coagulation Profile: No results for input(s): INR, PROTIME in the last 168 hours. Thyroid Function Tests: Recent  Labs    12/12/20 1435  TSH 0.432   Lipid Profile: Recent Labs    12/13/20 0536  CHOL 82  HDL 40*  LDLCALC 36  TRIG 31  CHOLHDL 2.1   Anemia Panel: No results for input(s): VITAMINB12, FOLATE, FERRITIN, TIBC, IRON, RETICCTPCT in the last 72 hours. Urine analysis:    Component Value Date/Time   COLORURINE AMBER (A) 12/12/2020 2337   APPEARANCEUR CLOUDY (A) 12/12/2020 2337   LABSPEC 1.018 12/12/2020 2337   PHURINE 5.0 12/12/2020 2337   GLUCOSEU 50 (A) 12/12/2020 2337   HGBUR LARGE (A) 12/12/2020 2337   BILIRUBINUR NEGATIVE 12/12/2020 2337   [place] 12/12/2020 2337   PROTEINUR 100 (A) 12/12/2020 2337   UROBILINOGEN 0.2 12/26/2012 0843   NITRITE POSITIVE (A) 12/12/2020 2337   LEUKOCYTESUR LARGE (A) 12/12/2020 2337   Sepsis Labs: Invalid input(s): PROCALCITONIN, [place]  Microbiology: Recent Results (from the past 240 hour(s))  Resp Panel by RT-PCR (Flu A&B, Covid) Nasopharyngeal Swab     Status: None   Collection Time: 12/12/20 12:03 AM   Specimen: Nasopharyngeal Swab; Nasopharyngeal(NP) swabs in vial transport medium  Result Value Ref Range Status   SARS Coronavirus 2 by RT PCR NEGATIVE NEGATIVE Final    Comment: (NOTE) SARS-CoV-2 target nucleic acids are NOT DETECTED.  The SARS-CoV-2 RNA is generally detectable in upper respiratory specimens during the acute phase of infection. The lowest concentration of SARS-CoV-2 viral copies this assay can detect is 138 copies/mL. A negative result does not preclude SARS-Cov-2 infection and should not be used as the sole basis for treatment or other patient management decisions. A negative result may occur with  improper specimen collection/handling, submission of specimen other than nasopharyngeal swab, presence of viral mutation(s) within the areas targeted by this assay, and inadequate number of viral copies(<138 copies/mL). A negative result must be combined with clinical observations, patient history, and  epidemiological information. The expected result is Negative.  Fact Sheet for Patients:  EntrepreneurPulse.com.au  Fact Sheet for Healthcare Providers:  IncredibleEmployment.be  This test is no t yet approved or cleared by the [place] and  has been authorized for detection and/or diagnosis of SARS-CoV-2 by FDA under an Emergency Use Authorization (EUA). This EUA will remain  in effect (meaning this test can be used) for the duration of the COVID-19 declaration under Section 564(b)(1) of the Act, 21 U.S.C.section 360bbb-3(b)(1), unless the authorization is terminated  or revoked sooner.       Influenza A by PCR NEGATIVE NEGATIVE Final   Influenza B by PCR NEGATIVE NEGATIVE Final    Comment: (NOTE) The Xpert Xpress SARS-CoV-2/FLU/RSV plus assay is intended as an aid in the diagnosis of influenza from Nasopharyngeal swab specimens and should not be used as a sole basis for treatment. Nasal washings and aspirates are unacceptable for Xpert Xpress SARS-CoV-2/FLU/RSV testing.  Fact Sheet for Patients: EntrepreneurPulse.com.au  Fact Sheet for Healthcare Providers: IncredibleEmployment.be  This test is not yet approved or cleared by the [place] FDA and has been authorized for detection and/or diagnosis of SARS-CoV-2 by FDA under an Emergency Use Authorization (EUA). This EUA will remain in effect (meaning this test can be used) for the duration of the COVID-19 declaration under Section 564(b)(1) of the Act, 21 U.S.C. section 360bbb-3(b)(1), unless the authorization is terminated or revoked.  Performed at [facility], [address], [place], [place] [address]     Radiology Studies: CT HEAD WO CONTRAST (5MM)  Result Date: 12/13/2020 CLINICAL DATA:  Altered mental status EXAM: CT HEAD WITHOUT CONTRAST TECHNIQUE: Contiguous axial images were obtained from the base of the  skull through the vertex without intravenous contrast. COMPARISON:  Exam from the previous day. FINDINGS: Brain: No evidence of acute infarction, hemorrhage, hydrocephalus, extra-axial collection or mass lesion/mass effect. Chronic atrophic and ischemic changes are noted. Vascular: No hyperdense vessel or unexpected calcification. Skull: Normal. Negative for fracture or focal lesion. Sinuses/Orbits: No acute finding. Other: None. IMPRESSION: Chronic atrophic and ischemic changes without acute abnormality. Electronically Signed   By: Inez Catalina M.D.   On: 12/13/2020 03:48   CT HEAD WO CONTRAST (5MM)  Result Date: 12/12/2020 CLINICAL DATA:  Head trauma. EXAM: CT HEAD WITHOUT CONTRAST TECHNIQUE: Contiguous axial images were obtained from the base of the skull through the vertex without intravenous contrast. COMPARISON:  Head and neck CTA 07/02/2020 FINDINGS: Brain: There is no evidence of an acute infarct, intracranial hemorrhage, mass, midline shift, or extra-axial fluid collection. Mild cerebral atrophy is unchanged. Hypodensities in the cerebral white matter bilaterally are unchanged and nonspecific but compatible with moderate chronic small vessel ischemic disease. Vascular: Calcified atherosclerosis at the skull base. No hyperdense vessel. Skull: No fracture or suspicious osseous lesion. Sinuses/Orbits: Paranasal sinuses and mastoid air cells are clear. Bilateral cataract extraction. Other: None. IMPRESSION: 1. No evidence of acute intracranial abnormality. 2. Moderate chronic small vessel ischemic disease. Electronically Signed   By: Logan Bores M.D.   On: 12/12/2020 14:24   ECHOCARDIOGRAM COMPLETE  Result Date: 12/12/2020    ECHOCARDIOGRAM REPORT   Patient Name:   Kathy Murphy [facility] Date of Exam: 12/12/2020 Medical Rec #:  [id number]      Height:       69.0 in Accession #:    [id number]     Weight:       190.0 lb Date of Birth:  1927-10-24      BSA:          2.021 m Patient Age:    38 years       BP:            122/81 mmHg Patient  Gender: F              HR:           76 bpm. Exam Location:  Inpatient Procedure: 2D Echo, Cardiac Doppler and Color Doppler Indications:    NSTEMI  History:        Patient has prior history of Echocardiogram examinations, most                 recent 07/13/2020. CHF, Previous Myocardial Infarction and CAD;                 Risk Factors:Hypertension and Dyslipidemia.  Sonographer:    Clayton Lefort RDCS (AE) Referring Phys: [id number] [place]  1. Left ventricular ejection fraction, by estimation, is 55 to 60%. The left ventricle has normal function. The left ventricle demonstrates regional wall motion abnormalities (see scoring diagram/findings for description). There is moderate asymmetric left ventricular hypertrophy of the septal segment. Left ventricular diastolic parameters are consistent with Grade I diastolic dysfunction (impaired relaxation).  2. Right ventricular systolic function is normal. The right ventricular size is normal. There is moderately elevated pulmonary artery systolic pressure. The estimated right ventricular systolic pressure is 62.6 mmHg.  3. Left atrial size was mildly dilated.  4. The mitral valve is abnormal. Mild mitral valve regurgitation. Moderate mitral annular calcification.  5. The aortic valve is tricuspid. Aortic valve regurgitation is trivial. Mild aortic valve sclerosis is present, with no evidence of aortic valve stenosis. Aortic valve mean gradient measures 5.0 mmHg.  6. Aortic dilatation noted. There is mild dilatation of the aortic root, measuring 42 mm.  7. The inferior vena cava is normal in size with greater than 50% respiratory variability, suggesting right atrial pressure of 3 mmHg. Comparison(s): Prior images reviewed side by side. LVEF remains normal range, wall motion abnomalities are similar to previous images. FINDINGS  Left Ventricle: Left ventricular ejection fraction, by estimation, is 55 to 60%. The left ventricle has normal  function. The left ventricle demonstrates regional wall motion abnormalities. The left ventricular internal cavity size was normal in size. There is moderate asymmetric left ventricular hypertrophy of the septal segment. Left ventricular diastolic parameters are consistent with Grade I diastolic dysfunction (impaired relaxation).  LV Wall Scoring: The basal inferior segment is akinetic. The posterior wall is hypokinetic. The entire anterior wall, antero-lateral wall, entire septum, entire apex, and mid and distal inferior wall are normal. Right Ventricle: The right ventricular size is normal. No increase in right ventricular wall thickness. Right ventricular systolic function is normal. There is moderately elevated pulmonary artery systolic pressure. The tricuspid regurgitant velocity is 3.37 m/s, and with an assumed right atrial pressure of 3 mmHg, the estimated right ventricular systolic pressure is 94.8 mmHg. Left Atrium: Left atrial size was mildly dilated. Right Atrium: Right atrial size was normal in size. Pericardium: There is no evidence of pericardial effusion. Mitral Valve: The mitral valve is abnormal. Moderate mitral annular calcification. Mild mitral valve regurgitation. MV peak gradient, 5.5 mmHg. The mean mitral valve gradient is 2.0 mmHg. Tricuspid Valve: The tricuspid valve is normal in structure. Tricuspid valve regurgitation is mild. Aortic Valve: The aortic valve is tricuspid. There is moderate aortic valve annular calcification. Aortic valve regurgitation is trivial. Mild aortic valve sclerosis is present, with no evidence of aortic valve stenosis. Aortic valve mean gradient measures 5.0 mmHg. Aortic valve peak gradient measures 8.0 mmHg. Aortic valve area, by VTI measures 2.10 cm. Pulmonic Valve: The pulmonic valve was grossly normal.  Pulmonic valve regurgitation is trivial. Aorta: Aortic dilatation noted. There is mild dilatation of the aortic root, measuring 42 mm. Venous: The inferior vena  cava is normal in size with greater than 50% respiratory variability, suggesting right atrial pressure of 3 mmHg. IAS/Shunts: No atrial level shunt detected by color flow Doppler.  LEFT VENTRICLE PLAX 2D LVIDd:         5.20 cm  Diastology LV PW:         1.30 cm  LV e' medial:    4.46 cm/s LV IVS:        1.60 cm  LV E/e' medial:  17.7 LVOT diam:     2.30 cm  LV e' lateral:   5.44 cm/s LV SV:         61       LV E/e' lateral: 14.5 LV SV Index:   30 LVOT Area:     4.15 cm  RIGHT VENTRICLE            IVC RV Basal diam:  3.10 cm    IVC diam: 1.10 cm RV S prime:     9.68 cm/s TAPSE (M-mode): 2.5 cm LEFT ATRIUM             Index       RIGHT ATRIUM           Index LA diam:        3.80 cm 1.88 cm/m  RA Area:     16.50 cm LA Vol (A2C):   72.9 ml 36.06 ml/m RA Volume:   38.60 ml  19.10 ml/m LA Vol (A4C):   69.8 ml 34.53 ml/m LA Biplane Vol: 71.2 ml 35.22 ml/m  AORTIC VALVE AV Area (Vmax):    1.96 cm AV Area (Vmean):   1.79 cm AV Area (VTI):     2.10 cm AV Vmax:           141.00 cm/s AV Vmean:          109.000 cm/s AV VTI:            0.291 m AV Peak Grad:      8.0 mmHg AV Mean Grad:      5.0 mmHg LVOT Vmax:         66.60 cm/s LVOT Vmean:        47.000 cm/s LVOT VTI:          0.147 m LVOT/AV VTI ratio: 0.51  AORTA Ao Root diam: 4.20 cm Ao Asc diam:  3.60 cm MITRAL VALVE                TRICUSPID VALVE MV Area (PHT): 4.80 cm     TR Peak grad:   45.4 mmHg MV Area VTI:   2.02 cm     TR Vmax:        337.00 cm/s MV Peak grad:  5.5 mmHg MV Mean grad:  2.0 mmHg     SHUNTS MV Vmax:       1.17 m/s     Systemic VTI:  0.15 m MV Vmean:      69.4 cm/s    Systemic Diam: 2.30 cm MV Decel Time: 158 msec MV E velocity: 79.10 cm/s MV A velocity: 123.00 cm/s MV E/A ratio:  0.64 Rozann Lesches MD Electronically signed by Rozann Lesches MD Signature Date/Time: 12/12/2020/5:03:03 PM    Final        Carrianne Hyun T. [place]  If 7PM-7AM, please contact night-coverage [email or web address] 12/13/2020, 1:30  PM

## 2020-12-13 NOTE — Interval H&P Note (Signed)
Cath Lab Visit (complete for each Cath Lab visit)  Clinical Evaluation Leading to the Procedure:   ACS: Yes.    Non-ACS:    Anginal Classification: CCS II  Anti-ischemic medical therapy: No Therapy  Non-Invasive Test Results: No non-invasive testing performed  Prior CABG: No previous CABG      History and Physical Interval Note:  12/13/2020 3:05 PM  Louellen Molder  has presented today for surgery, with the diagnosis of NSTEMI.  The various methods of treatment have been discussed with the patient and family. After consideration of risks, benefits and other options for treatment, the patient has consented to  Procedure(s): LEFT HEART CATH AND CORONARY ANGIOGRAPHY (N/A) as a surgical intervention.  The patient's history has been reviewed, patient examined, no change in status, stable for surgery.  I have reviewed the patient's chart and labs.  Questions were answered to the patient's satisfaction.     Quay Burow

## 2020-12-13 NOTE — Progress Notes (Signed)
PT Cancellation Note  Patient Details Name: Kathy Murphy MRN: [id number] DOB: [birth year]   Cancelled Treatment:    Reason Eval/Treat Not Completed: Medical issues which prohibited therapy Checked with RN this morning and pt sleeping - requested to let her sleep (she has been mobilizing in room with nursing).  Pt scheduled for cardiac cath in afternoon due to unstable angina.  Will hold PT today. Abran Richard, PT Acute Rehab Services Pager [phone] [facility] [place] 12/13/2020, 1:49 PM

## 2020-12-13 NOTE — H&P (View-Only) (Signed)
Progress Note  Patient Name: Kathy Murphy Date of Encounter: 12/13/2020  [facility] Cardiologist: Sanda Klein, MD   Subjective   Patient states she is feeling well, no chest pain, SOB, dizziness this morning. Her friend is at bedside. She does recall overnight getting agitated and confused to some degree. She is oriented to person/place/time this morning, able to follow commands appropriately. She is agreeable with cardiac cath as previously consented.   Inpatient Medications    Scheduled Meds:  anastrozole  1 mg Oral Daily   aspirin EC  81 mg Oral Daily   cholecalciferol  5,000 Units Oral Daily   levothyroxine  125 mcg Oral Q0600   metoprolol succinate  50 mg Oral Daily   pantoprazole  40 mg Oral Daily   polyvinyl alcohol  1 drop Both Eyes Daily   rosuvastatin  10 mg Oral Daily   sodium chloride flush  3 mL Intravenous Q12H   sodium chloride flush  3 mL Intravenous Q12H   Continuous Infusions:  sodium chloride     sodium chloride     sodium chloride 1 mL/kg/hr (12/13/20 0545)   cefTRIAXone (ROCEPHIN)  IV 1 g (12/13/20 0602)   heparin 1,250 Units/hr (12/13/20 0230)   PRN Meds: sodium chloride, sodium chloride, acetaminophen, diazepam, morphine injection, nitroGLYCERIN, ondansetron (ZOFRAN) IV, sodium chloride flush, sodium chloride flush   Vital Signs    Vitals:   12/13/20 0408 12/13/20 0500 12/13/20 0733 12/13/20 0743  BP: (!) 160/62  (!) 158/61   Pulse: 83  81 81  Resp:   (!) 21 18  Temp:  98.9 F (37.2 C) 100.2 F (37.9 C)   TempSrc:  Oral Oral   SpO2:  93% 90% 93%  Weight: 80.3 kg     Height:        Intake/Output Summary (Last 24 hours) at 12/13/2020 0919 Last data filed at 12/13/2020 0400 Gross per 24 hour  Intake 1112.59 ml  Output 600 ml  Net 512.59 ml   Last 3 Weights 12/13/2020 12/11/2020 07/01/2020  Weight (lbs) 177 lb 0.5 oz 190 lb 184 lb  Weight (kg) 80.3 kg 86.183 kg 83.462 kg      Telemetry    SR 80s with occasional PVCs , NSVT up  to 4 runs noted x1- Personally Reviewed  ECG    EKG today showed SR 77 bpm, non-specific IVCD, STD noted of lead I and aVL - Personally Reviewed  Physical Exam   GEN: No acute distress. Well nourished   Neck: No JVD Cardiac: RRR, no murmurs, rubs, or gallops.  Respiratory: Clear to auscultation bilaterally. On room air, Speaks full sentence, pox 95%  GI: Soft, nontender, non-distended  MS: No BLE edema; No deformity. Neuro:  Alert and oriented x3, no cognitive deficit, follow commands appropriately  Psych: Normal affect   Labs    High Sensitivity Troponin:   Recent Labs  Lab 12/12/20 0113 12/12/20 0329 12/12/20 0514 12/12/20 0925 12/12/20 1144  TROPONINIHS 64* 64* 95* 168* 169*     Chemistry Recent Labs  Lab 12/11/20 2313 12/12/20 1144 12/13/20 0536  NA 139 140 138  K 3.5 3.6 4.2  CL 105 108 105  CO2 25 20* 24  GLUCOSE 185* 143* 137*  BUN 28* 22 21  CREATININE 0.77 0.90 0.84  CALCIUM 8.9 8.9 8.9  MG  --  2.0  --   PROT 6.7  --   --   ALBUMIN 4.1  --   --   AST 15  --   --  ALT 11  --   --   ALKPHOS 83  --   --   BILITOT 0.9  --   --   GFRNONAA >60 >60 >60  ANIONGAP 9 12 9     Lipids  Recent Labs  Lab 12/13/20 0536  CHOL 82  TRIG 31  HDL 40*  LDLCALC 36  CHOLHDL 2.1    Hematology Recent Labs  Lab 12/11/20 2313 12/13/20 0042 12/13/20 0536  WBC 11.4* ACCURACY OF RESULTS QUESTIONABLE. RECOMMEND RECOLLECT TO VERIFY. 12.8*  RBC 4.21 ACCURACY OF RESULTS QUESTIONABLE. RECOMMEND RECOLLECT TO VERIFY. 3.82*  HGB 13.0 ACCURACY OF RESULTS QUESTIONABLE. RECOMMEND RECOLLECT TO VERIFY. 12.0  HCT 40.1 ACCURACY OF RESULTS QUESTIONABLE. RECOMMEND RECOLLECT TO VERIFY. 36.4  MCV 95.2 ACCURACY OF RESULTS QUESTIONABLE. RECOMMEND RECOLLECT TO VERIFY. 95.3  MCH 30.9 ACCURACY OF RESULTS QUESTIONABLE. RECOMMEND RECOLLECT TO VERIFY. 31.4  MCHC 32.4 ACCURACY OF RESULTS QUESTIONABLE. RECOMMEND RECOLLECT TO VERIFY. 33.0  RDW 13.2 ACCURACY OF RESULTS QUESTIONABLE.  RECOMMEND RECOLLECT TO VERIFY. 13.2  PLT 128* ACCURACY OF RESULTS QUESTIONABLE. RECOMMEND RECOLLECT TO VERIFY. 104*   Thyroid  Recent Labs  Lab 12/12/20 1435  TSH 0.432    BNPNo results for input(s): BNP, PROBNP in the last 168 hours.  DDimer No results for input(s): DDIMER in the last 168 hours.   Radiology    CT HEAD WO CONTRAST (5MM)  Result Date: 12/13/2020 CLINICAL DATA:  Altered mental status EXAM: CT HEAD WITHOUT CONTRAST TECHNIQUE: Contiguous axial images were obtained from the base of the skull through the vertex without intravenous contrast. COMPARISON:  Exam from the previous day. FINDINGS: Brain: No evidence of acute infarction, hemorrhage, hydrocephalus, extra-axial collection or mass lesion/mass effect. Chronic atrophic and ischemic changes are noted. Vascular: No hyperdense vessel or unexpected calcification. Skull: Normal. Negative for fracture or focal lesion. Sinuses/Orbits: No acute finding. Other: None. IMPRESSION: Chronic atrophic and ischemic changes without acute abnormality. Electronically Signed   By: Inez Catalina M.D.   On: 12/13/2020 03:48   CT HEAD WO CONTRAST (5MM)  Result Date: 12/12/2020 CLINICAL DATA:  Head trauma. EXAM: CT HEAD WITHOUT CONTRAST TECHNIQUE: Contiguous axial images were obtained from the base of the skull through the vertex without intravenous contrast. COMPARISON:  Head and neck CTA 07/02/2020 FINDINGS: Brain: There is no evidence of an acute infarct, intracranial hemorrhage, mass, midline shift, or extra-axial fluid collection. Mild cerebral atrophy is unchanged. Hypodensities in the cerebral white matter bilaterally are unchanged and nonspecific but compatible with moderate chronic small vessel ischemic disease. Vascular: Calcified atherosclerosis at the skull base. No hyperdense vessel. Skull: No fracture or suspicious osseous lesion. Sinuses/Orbits: Paranasal sinuses and mastoid air cells are clear. Bilateral cataract extraction. Other: None.  IMPRESSION: 1. No evidence of acute intracranial abnormality. 2. Moderate chronic small vessel ischemic disease. Electronically Signed   By: Logan Bores M.D.   On: 12/12/2020 14:24   DG Chest Portable 1 View  Result Date: 12/11/2020 CLINICAL DATA:  Chest pain. EXAM: PORTABLE CHEST 1 VIEW COMPARISON:  CT chest 09/09/2010. FINDINGS: Surgical clips overlie the lower neck and right axilla. Aorta is tortuous. The heart is borderline enlarged. There is prominence of the right upper paratracheal region corresponding to ectatic vasculature on CT. There is no focal lung consolidation, pleural effusion or pneumothorax. There is minimal left basilar atelectasis. No acute fractures are seen. IMPRESSION: Left basilar atelectasis. Electronically Signed   By: Ronney Asters M.D.   On: 12/11/2020 23:49   ECHOCARDIOGRAM COMPLETE  Result Date: 12/12/2020  ECHOCARDIOGRAM REPORT   Patient Name:   JERELENE SALAAM [facility] Date of Exam: 12/12/2020 Medical Rec #:  [id number]      Height:       69.0 in Accession #:    [id number]     Weight:       190.0 lb Date of Birth:  April 15, 1927      BSA:          2.021 m Patient Age:    13 years       BP:           122/81 mmHg Patient Gender: F              HR:           76 bpm. Exam Location:  Inpatient Procedure: 2D Echo, Cardiac Doppler and Color Doppler Indications:    NSTEMI  History:        Patient has prior history of Echocardiogram examinations, most                 recent 07/13/2020. CHF, Previous Myocardial Infarction and CAD;                 Risk Factors:Hypertension and Dyslipidemia.  Sonographer:    Clayton Lefort RDCS (AE) Referring Phys: [id number] [place]  1. Left ventricular ejection fraction, by estimation, is 55 to 60%. The left ventricle has normal function. The left ventricle demonstrates regional wall motion abnormalities (see scoring diagram/findings for description). There is moderate asymmetric left ventricular hypertrophy of the septal segment. Left ventricular  diastolic parameters are consistent with Grade I diastolic dysfunction (impaired relaxation).  2. Right ventricular systolic function is normal. The right ventricular size is normal. There is moderately elevated pulmonary artery systolic pressure. The estimated right ventricular systolic pressure is 91.6 mmHg.  3. Left atrial size was mildly dilated.  4. The mitral valve is abnormal. Mild mitral valve regurgitation. Moderate mitral annular calcification.  5. The aortic valve is tricuspid. Aortic valve regurgitation is trivial. Mild aortic valve sclerosis is present, with no evidence of aortic valve stenosis. Aortic valve mean gradient measures 5.0 mmHg.  6. Aortic dilatation noted. There is mild dilatation of the aortic root, measuring 42 mm.  7. The inferior vena cava is normal in size with greater than 50% respiratory variability, suggesting right atrial pressure of 3 mmHg. Comparison(s): Prior images reviewed side by side. LVEF remains normal range, wall motion abnomalities are similar to previous images. FINDINGS  Left Ventricle: Left ventricular ejection fraction, by estimation, is 55 to 60%. The left ventricle has normal function. The left ventricle demonstrates regional wall motion abnormalities. The left ventricular internal cavity size was normal in size. There is moderate asymmetric left ventricular hypertrophy of the septal segment. Left ventricular diastolic parameters are consistent with Grade I diastolic dysfunction (impaired relaxation).  LV Wall Scoring: The basal inferior segment is akinetic. The posterior wall is hypokinetic. The entire anterior wall, antero-lateral wall, entire septum, entire apex, and mid and distal inferior wall are normal. Right Ventricle: The right ventricular size is normal. No increase in right ventricular wall thickness. Right ventricular systolic function is normal. There is moderately elevated pulmonary artery systolic pressure. The tricuspid regurgitant velocity is 3.37  m/s, and with an assumed right atrial pressure of 3 mmHg, the estimated right ventricular systolic pressure is 38.4 mmHg. Left Atrium: Left atrial size was mildly dilated. Right Atrium: Right atrial size was normal in size. Pericardium: There is no evidence of pericardial effusion. Mitral  Valve: The mitral valve is abnormal. Moderate mitral annular calcification. Mild mitral valve regurgitation. MV peak gradient, 5.5 mmHg. The mean mitral valve gradient is 2.0 mmHg. Tricuspid Valve: The tricuspid valve is normal in structure. Tricuspid valve regurgitation is mild. Aortic Valve: The aortic valve is tricuspid. There is moderate aortic valve annular calcification. Aortic valve regurgitation is trivial. Mild aortic valve sclerosis is present, with no evidence of aortic valve stenosis. Aortic valve mean gradient measures 5.0 mmHg. Aortic valve peak gradient measures 8.0 mmHg. Aortic valve area, by VTI measures 2.10 cm. Pulmonic Valve: The pulmonic valve was grossly normal. Pulmonic valve regurgitation is trivial. Aorta: Aortic dilatation noted. There is mild dilatation of the aortic root, measuring 42 mm. Venous: The inferior vena cava is normal in size with greater than 50% respiratory variability, suggesting right atrial pressure of 3 mmHg. IAS/Shunts: No atrial level shunt detected by color flow Doppler.  LEFT VENTRICLE PLAX 2D LVIDd:         5.20 cm  Diastology LV PW:         1.30 cm  LV e' medial:    4.46 cm/s LV IVS:        1.60 cm  LV E/e' medial:  17.7 LVOT diam:     2.30 cm  LV e' lateral:   5.44 cm/s LV SV:         61       LV E/e' lateral: 14.5 LV SV Index:   30 LVOT Area:     4.15 cm  RIGHT VENTRICLE            IVC RV Basal diam:  3.10 cm    IVC diam: 1.10 cm RV S prime:     9.68 cm/s TAPSE (M-mode): 2.5 cm LEFT ATRIUM             Index       RIGHT ATRIUM           Index LA diam:        3.80 cm 1.88 cm/m  RA Area:     16.50 cm LA Vol (A2C):   72.9 ml 36.06 ml/m RA Volume:   38.60 ml  19.10 ml/m LA Vol  (A4C):   69.8 ml 34.53 ml/m LA Biplane Vol: 71.2 ml 35.22 ml/m  AORTIC VALVE AV Area (Vmax):    1.96 cm AV Area (Vmean):   1.79 cm AV Area (VTI):     2.10 cm AV Vmax:           141.00 cm/s AV Vmean:          109.000 cm/s AV VTI:            0.291 m AV Peak Grad:      8.0 mmHg AV Mean Grad:      5.0 mmHg LVOT Vmax:         66.60 cm/s LVOT Vmean:        47.000 cm/s LVOT VTI:          0.147 m LVOT/AV VTI ratio: 0.51  AORTA Ao Root diam: 4.20 cm Ao Asc diam:  3.60 cm MITRAL VALVE                TRICUSPID VALVE MV Area (PHT): 4.80 cm     TR Peak grad:   45.4 mmHg MV Area VTI:   2.02 cm     TR Vmax:        337.00 cm/s MV Peak grad:  5.5 mmHg MV  Mean grad:  2.0 mmHg     SHUNTS MV Vmax:       1.17 m/s     Systemic VTI:  0.15 m MV Vmean:      69.4 cm/s    Systemic Diam: 2.30 cm MV Decel Time: 158 msec MV E velocity: 79.10 cm/s MV A velocity: 123.00 cm/s MV E/A ratio:  0.64 Rozann Lesches MD Electronically signed by Rozann Lesches MD Signature Date/Time: 12/12/2020/5:03:03 PM    Final     Cardiac Studies   Echo from 12/12/20:  1. Left ventricular ejection fraction, by estimation, is 55 to 60%. The  left ventricle has normal function. The left ventricle demonstrates  regional wall motion abnormalities (see scoring diagram/findings for  description). There is moderate asymmetric  left ventricular hypertrophy of the septal segment. Left ventricular  diastolic parameters are consistent with Grade I diastolic dysfunction  (impaired relaxation).   2. Right ventricular systolic function is normal. The right ventricular  size is normal. There is moderately elevated pulmonary artery systolic  pressure. The estimated right ventricular systolic pressure is 31.5 mmHg.   3. Left atrial size was mildly dilated.   4. The mitral valve is abnormal. Mild mitral valve regurgitation.  Moderate mitral annular calcification.   5. The aortic valve is tricuspid. Aortic valve regurgitation is trivial.  Mild aortic valve  sclerosis is present, with no evidence of aortic valve  stenosis. Aortic valve mean gradient measures 5.0 mmHg.   6. Aortic dilatation noted. There is mild dilatation of the aortic root,  measuring 42 mm.   7. The inferior vena cava is normal in size with greater than 50%  respiratory variability, suggesting right atrial pressure of 3 mmHg.   Comparison(s): Prior images reviewed side by side. LVEF remains normal  range, wall motion abnomalities are similar to previous images.   LHC on 02/08/2016:  Prox LAD to Mid LAD lesion, 50 %stenosed.   Significant coronary artery disease with subtotally occluded mid right coronary collateralized from the left coronary artery (LAD and circumflex). Severe ostial obstruction in the second diagonal, 75% stenosis in the first diagonal, 70% stenosis within a region of tortuosity in the ramus intermedius, and 50% narrowing in the proximal to mid LAD. The distal LAD is diffusely diseased without focal obstruction. Low normal LV function with EF estimated to be 50%. Failed PTCA of the mid right coronary due to poor guide catheter support.   RECOMMENDATION: Begin dual antiplatelet therapy. Re-attempt PCI of the right coronary from the femoral approach on 02/11/2016. Will discuss with CTO team as well. Intensify anti-ischemic therapy.  Patient Profile     Kathy Murphy is a 87 y.o. female with a hx of CAD, chronic combined CHF (with normalization of LVEF), moderate dilation of aortic root, breast CA s/p lumpectomy, GERD, thyroid CA, HTN, HLD, hypothyroidism, obesity, unsteady gait/falls who is being seen 12/12/2020 for the evaluation of chest pain/possible NSTEMI.   Assessment & Plan     Chest pain, suspected unstable angina  CAD with hx of PCIs  - presented with chest pain - hsTroponins checked with peak of 169 - EKG at admission NSR with LVH and repolarization abnormality that is more pronounced   - continue medical therapy with ASA, heparin gtt,  crestor, and metoprolol XL  - noted losartan held and metoprolol reduced from 100 to 50 by IM due to soft BP , BP now elevated, may resume home meds / dose  - Echo from 12/12/20 showed EF 55-60%, see  report as above  - cardiac cath is planned today, NPO, further recommendation pending cath , continue gentle IVF   NSVT - noted up to 4 beats - check Mag  - continue metoprolol, will resume home dose metoprolol XL 100mg  daily   HLD - goal LDL <70, LDL 36 from 12/13/20, at goal  - continue crestor    Hx of ischemic CM 2017  - resolved LV dysfunction with EF recovered by last echo 07/2020, has not required standing diuretic previously - Echo 10/2 as above  - euvolemic today  - GDMT: continue metoprolol XL, resume losartan when BP stable   HTN - BP elevated, may resume losartan     Moderate dilation of aortic root - mild dilatation of the aortic root measuring 42 mm on Echo from 12/12/20, follow up outpatient    UTI Hx of nocturnal delirium 10/2 resolved  Hypothyroidism GERD [place] cancer Debility  - managed per IM     For questions or updates, please contact [facility] Please consult [email or web address] for contact info under        Signed, Margie Billet, NP  12/13/2020, 9:19 AM

## 2020-12-13 NOTE — Progress Notes (Signed)
MD, pt is combative and verbally abusive to staff, pt is positive for UTI and is now on Rocephin IV.  Pt is refusing the R groin prep for the cath, may have to be done in cath lab, thanks Arvella Nigh RN.

## 2020-12-13 NOTE — Progress Notes (Addendum)
Progress Note  Patient Name: Kathy Murphy Date of Encounter: 12/13/2020  [facility] Cardiologist: Sanda Klein, MD   Subjective   Patient states she is feeling well, no chest pain, SOB, dizziness this morning. Her friend is at bedside. She does recall overnight getting agitated and confused to some degree. She is oriented to person/place/time this morning, able to follow commands appropriately. She is agreeable with cardiac cath as previously consented.   Inpatient Medications    Scheduled Meds:  anastrozole  1 mg Oral Daily   aspirin EC  81 mg Oral Daily   cholecalciferol  5,000 Units Oral Daily   levothyroxine  125 mcg Oral Q0600   metoprolol succinate  50 mg Oral Daily   pantoprazole  40 mg Oral Daily   polyvinyl alcohol  1 drop Both Eyes Daily   rosuvastatin  10 mg Oral Daily   sodium chloride flush  3 mL Intravenous Q12H   sodium chloride flush  3 mL Intravenous Q12H   Continuous Infusions:  sodium chloride     sodium chloride     sodium chloride 1 mL/kg/hr (12/13/20 0545)   cefTRIAXone (ROCEPHIN)  IV 1 g (12/13/20 0602)   heparin 1,250 Units/hr (12/13/20 0230)   PRN Meds: sodium chloride, sodium chloride, acetaminophen, diazepam, morphine injection, nitroGLYCERIN, ondansetron (ZOFRAN) IV, sodium chloride flush, sodium chloride flush   Vital Signs    Vitals:   12/13/20 0408 12/13/20 0500 12/13/20 0733 12/13/20 0743  BP: (!) 160/62  (!) 158/61   Pulse: 83  81 81  Resp:   (!) 21 18  Temp:  98.9 F (37.2 C) 100.2 F (37.9 C)   TempSrc:  Oral Oral   SpO2:  93% 90% 93%  Weight: 80.3 kg     Height:        Intake/Output Summary (Last 24 hours) at 12/13/2020 0919 Last data filed at 12/13/2020 0400 Gross per 24 hour  Intake 1112.59 ml  Output 600 ml  Net 512.59 ml   Last 3 Weights 12/13/2020 12/11/2020 07/01/2020  Weight (lbs) 177 lb 0.5 oz 190 lb 184 lb  Weight (kg) 80.3 kg 86.183 kg 83.462 kg      Telemetry    SR 80s with occasional PVCs , NSVT up  to 4 runs noted x1- Personally Reviewed  ECG    EKG today showed SR 77 bpm, non-specific IVCD, STD noted of lead I and aVL - Personally Reviewed  Physical Exam   GEN: No acute distress. Well nourished   Neck: No JVD Cardiac: RRR, no murmurs, rubs, or gallops.  Respiratory: Clear to auscultation bilaterally. On room air, Speaks full sentence, pox 95%  GI: Soft, nontender, non-distended  MS: No BLE edema; No deformity. Neuro:  Alert and oriented x3, no cognitive deficit, follow commands appropriately  Psych: Normal affect   Labs    High Sensitivity Troponin:   Recent Labs  Lab 12/12/20 0113 12/12/20 0329 12/12/20 0514 12/12/20 0925 12/12/20 1144  TROPONINIHS 64* 64* 95* 168* 169*     Chemistry Recent Labs  Lab 12/11/20 2313 12/12/20 1144 12/13/20 0536  NA 139 140 138  K 3.5 3.6 4.2  CL 105 108 105  CO2 25 20* 24  GLUCOSE 185* 143* 137*  BUN 28* 22 21  CREATININE 0.77 0.90 0.84  CALCIUM 8.9 8.9 8.9  MG  --  2.0  --   PROT 6.7  --   --   ALBUMIN 4.1  --   --   AST 15  --   --  ALT 11  --   --   ALKPHOS 83  --   --   BILITOT 0.9  --   --   GFRNONAA >60 >60 >60  ANIONGAP 9 12 9     Lipids  Recent Labs  Lab 12/13/20 0536  CHOL 82  TRIG 31  HDL 40*  LDLCALC 36  CHOLHDL 2.1    Hematology Recent Labs  Lab 12/11/20 2313 12/13/20 0042 12/13/20 0536  WBC 11.4* ACCURACY OF RESULTS QUESTIONABLE. RECOMMEND RECOLLECT TO VERIFY. 12.8*  RBC 4.21 ACCURACY OF RESULTS QUESTIONABLE. RECOMMEND RECOLLECT TO VERIFY. 3.82*  HGB 13.0 ACCURACY OF RESULTS QUESTIONABLE. RECOMMEND RECOLLECT TO VERIFY. 12.0  HCT 40.1 ACCURACY OF RESULTS QUESTIONABLE. RECOMMEND RECOLLECT TO VERIFY. 36.4  MCV 95.2 ACCURACY OF RESULTS QUESTIONABLE. RECOMMEND RECOLLECT TO VERIFY. 95.3  MCH 30.9 ACCURACY OF RESULTS QUESTIONABLE. RECOMMEND RECOLLECT TO VERIFY. 31.4  MCHC 32.4 ACCURACY OF RESULTS QUESTIONABLE. RECOMMEND RECOLLECT TO VERIFY. 33.0  RDW 13.2 ACCURACY OF RESULTS QUESTIONABLE.  RECOMMEND RECOLLECT TO VERIFY. 13.2  PLT 128* ACCURACY OF RESULTS QUESTIONABLE. RECOMMEND RECOLLECT TO VERIFY. 104*   Thyroid  Recent Labs  Lab 12/12/20 1435  TSH 0.432    BNPNo results for input(s): BNP, PROBNP in the last 168 hours.  DDimer No results for input(s): DDIMER in the last 168 hours.   Radiology    CT HEAD WO CONTRAST (5MM)  Result Date: 12/13/2020 CLINICAL DATA:  Altered mental status EXAM: CT HEAD WITHOUT CONTRAST TECHNIQUE: Contiguous axial images were obtained from the base of the skull through the vertex without intravenous contrast. COMPARISON:  Exam from the previous day. FINDINGS: Brain: No evidence of acute infarction, hemorrhage, hydrocephalus, extra-axial collection or mass lesion/mass effect. Chronic atrophic and ischemic changes are noted. Vascular: No hyperdense vessel or unexpected calcification. Skull: Normal. Negative for fracture or focal lesion. Sinuses/Orbits: No acute finding. Other: None. IMPRESSION: Chronic atrophic and ischemic changes without acute abnormality. Electronically Signed   By: Inez Catalina M.D.   On: 12/13/2020 03:48   CT HEAD WO CONTRAST (5MM)  Result Date: 12/12/2020 CLINICAL DATA:  Head trauma. EXAM: CT HEAD WITHOUT CONTRAST TECHNIQUE: Contiguous axial images were obtained from the base of the skull through the vertex without intravenous contrast. COMPARISON:  Head and neck CTA 07/02/2020 FINDINGS: Brain: There is no evidence of an acute infarct, intracranial hemorrhage, mass, midline shift, or extra-axial fluid collection. Mild cerebral atrophy is unchanged. Hypodensities in the cerebral white matter bilaterally are unchanged and nonspecific but compatible with moderate chronic small vessel ischemic disease. Vascular: Calcified atherosclerosis at the skull base. No hyperdense vessel. Skull: No fracture or suspicious osseous lesion. Sinuses/Orbits: Paranasal sinuses and mastoid air cells are clear. Bilateral cataract extraction. Other: None.  IMPRESSION: 1. No evidence of acute intracranial abnormality. 2. Moderate chronic small vessel ischemic disease. Electronically Signed   By: Logan Bores M.D.   On: 12/12/2020 14:24   DG Chest Portable 1 View  Result Date: 12/11/2020 CLINICAL DATA:  Chest pain. EXAM: PORTABLE CHEST 1 VIEW COMPARISON:  CT chest 09/09/2010. FINDINGS: Surgical clips overlie the lower neck and right axilla. Aorta is tortuous. The heart is borderline enlarged. There is prominence of the right upper paratracheal region corresponding to ectatic vasculature on CT. There is no focal lung consolidation, pleural effusion or pneumothorax. There is minimal left basilar atelectasis. No acute fractures are seen. IMPRESSION: Left basilar atelectasis. Electronically Signed   By: Ronney Asters M.D.   On: 12/11/2020 23:49   ECHOCARDIOGRAM COMPLETE  Result Date: 12/12/2020  ECHOCARDIOGRAM REPORT   Patient Name:   Kathy Murphy [facility] Date of Exam: 12/12/2020 Medical Rec #:  [id number]      Height:       69.0 in Accession #:    [id number]     Weight:       190.0 lb Date of Birth:  12-Mar-1928      BSA:          2.021 m Patient Age:    58 years       BP:           122/81 mmHg Patient Gender: F              HR:           76 bpm. Exam Location:  Inpatient Procedure: 2D Echo, Cardiac Doppler and Color Doppler Indications:    NSTEMI  History:        Patient has prior history of Echocardiogram examinations, most                 recent 07/13/2020. CHF, Previous Myocardial Infarction and CAD;                 Risk Factors:Hypertension and Dyslipidemia.  Sonographer:    Clayton Lefort RDCS (AE) Referring Phys: [id number] [place]  1. Left ventricular ejection fraction, by estimation, is 55 to 60%. The left ventricle has normal function. The left ventricle demonstrates regional wall motion abnormalities (see scoring diagram/findings for description). There is moderate asymmetric left ventricular hypertrophy of the septal segment. Left ventricular  diastolic parameters are consistent with Grade I diastolic dysfunction (impaired relaxation).  2. Right ventricular systolic function is normal. The right ventricular size is normal. There is moderately elevated pulmonary artery systolic pressure. The estimated right ventricular systolic pressure is 25.0 mmHg.  3. Left atrial size was mildly dilated.  4. The mitral valve is abnormal. Mild mitral valve regurgitation. Moderate mitral annular calcification.  5. The aortic valve is tricuspid. Aortic valve regurgitation is trivial. Mild aortic valve sclerosis is present, with no evidence of aortic valve stenosis. Aortic valve mean gradient measures 5.0 mmHg.  6. Aortic dilatation noted. There is mild dilatation of the aortic root, measuring 42 mm.  7. The inferior vena cava is normal in size with greater than 50% respiratory variability, suggesting right atrial pressure of 3 mmHg. Comparison(s): Prior images reviewed side by side. LVEF remains normal range, wall motion abnomalities are similar to previous images. FINDINGS  Left Ventricle: Left ventricular ejection fraction, by estimation, is 55 to 60%. The left ventricle has normal function. The left ventricle demonstrates regional wall motion abnormalities. The left ventricular internal cavity size was normal in size. There is moderate asymmetric left ventricular hypertrophy of the septal segment. Left ventricular diastolic parameters are consistent with Grade I diastolic dysfunction (impaired relaxation).  LV Wall Scoring: The basal inferior segment is akinetic. The posterior wall is hypokinetic. The entire anterior wall, antero-lateral wall, entire septum, entire apex, and mid and distal inferior wall are normal. Right Ventricle: The right ventricular size is normal. No increase in right ventricular wall thickness. Right ventricular systolic function is normal. There is moderately elevated pulmonary artery systolic pressure. The tricuspid regurgitant velocity is 3.37  m/s, and with an assumed right atrial pressure of 3 mmHg, the estimated right ventricular systolic pressure is 53.9 mmHg. Left Atrium: Left atrial size was mildly dilated. Right Atrium: Right atrial size was normal in size. Pericardium: There is no evidence of pericardial effusion. Mitral  Valve: The mitral valve is abnormal. Moderate mitral annular calcification. Mild mitral valve regurgitation. MV peak gradient, 5.5 mmHg. The mean mitral valve gradient is 2.0 mmHg. Tricuspid Valve: The tricuspid valve is normal in structure. Tricuspid valve regurgitation is mild. Aortic Valve: The aortic valve is tricuspid. There is moderate aortic valve annular calcification. Aortic valve regurgitation is trivial. Mild aortic valve sclerosis is present, with no evidence of aortic valve stenosis. Aortic valve mean gradient measures 5.0 mmHg. Aortic valve peak gradient measures 8.0 mmHg. Aortic valve area, by VTI measures 2.10 cm. Pulmonic Valve: The pulmonic valve was grossly normal. Pulmonic valve regurgitation is trivial. Aorta: Aortic dilatation noted. There is mild dilatation of the aortic root, measuring 42 mm. Venous: The inferior vena cava is normal in size with greater than 50% respiratory variability, suggesting right atrial pressure of 3 mmHg. IAS/Shunts: No atrial level shunt detected by color flow Doppler.  LEFT VENTRICLE PLAX 2D LVIDd:         5.20 cm  Diastology LV PW:         1.30 cm  LV e' medial:    4.46 cm/s LV IVS:        1.60 cm  LV E/e' medial:  17.7 LVOT diam:     2.30 cm  LV e' lateral:   5.44 cm/s LV SV:         61       LV E/e' lateral: 14.5 LV SV Index:   30 LVOT Area:     4.15 cm  RIGHT VENTRICLE            IVC RV Basal diam:  3.10 cm    IVC diam: 1.10 cm RV S prime:     9.68 cm/s TAPSE (M-mode): 2.5 cm LEFT ATRIUM             Index       RIGHT ATRIUM           Index LA diam:        3.80 cm 1.88 cm/m  RA Area:     16.50 cm LA Vol (A2C):   72.9 ml 36.06 ml/m RA Volume:   38.60 ml  19.10 ml/m LA Vol  (A4C):   69.8 ml 34.53 ml/m LA Biplane Vol: 71.2 ml 35.22 ml/m  AORTIC VALVE AV Area (Vmax):    1.96 cm AV Area (Vmean):   1.79 cm AV Area (VTI):     2.10 cm AV Vmax:           141.00 cm/s AV Vmean:          109.000 cm/s AV VTI:            0.291 m AV Peak Grad:      8.0 mmHg AV Mean Grad:      5.0 mmHg LVOT Vmax:         66.60 cm/s LVOT Vmean:        47.000 cm/s LVOT VTI:          0.147 m LVOT/AV VTI ratio: 0.51  AORTA Ao Root diam: 4.20 cm Ao Asc diam:  3.60 cm MITRAL VALVE                TRICUSPID VALVE MV Area (PHT): 4.80 cm     TR Peak grad:   45.4 mmHg MV Area VTI:   2.02 cm     TR Vmax:        337.00 cm/s MV Peak grad:  5.5 mmHg MV  Mean grad:  2.0 mmHg     SHUNTS MV Vmax:       1.17 m/s     Systemic VTI:  0.15 m MV Vmean:      69.4 cm/s    Systemic Diam: 2.30 cm MV Decel Time: 158 msec MV E velocity: 79.10 cm/s MV A velocity: 123.00 cm/s MV E/A ratio:  0.64 Rozann Lesches MD Electronically signed by Rozann Lesches MD Signature Date/Time: 12/12/2020/5:03:03 PM    Final     Cardiac Studies   Echo from 12/12/20:  1. Left ventricular ejection fraction, by estimation, is 55 to 60%. The  left ventricle has normal function. The left ventricle demonstrates  regional wall motion abnormalities (see scoring diagram/findings for  description). There is moderate asymmetric  left ventricular hypertrophy of the septal segment. Left ventricular  diastolic parameters are consistent with Grade I diastolic dysfunction  (impaired relaxation).   2. Right ventricular systolic function is normal. The right ventricular  size is normal. There is moderately elevated pulmonary artery systolic  pressure. The estimated right ventricular systolic pressure is 96.2 mmHg.   3. Left atrial size was mildly dilated.   4. The mitral valve is abnormal. Mild mitral valve regurgitation.  Moderate mitral annular calcification.   5. The aortic valve is tricuspid. Aortic valve regurgitation is trivial.  Mild aortic valve  sclerosis is present, with no evidence of aortic valve  stenosis. Aortic valve mean gradient measures 5.0 mmHg.   6. Aortic dilatation noted. There is mild dilatation of the aortic root,  measuring 42 mm.   7. The inferior vena cava is normal in size with greater than 50%  respiratory variability, suggesting right atrial pressure of 3 mmHg.   Comparison(s): Prior images reviewed side by side. LVEF remains normal  range, wall motion abnomalities are similar to previous images.   LHC on 02/08/2016:  Prox LAD to Mid LAD lesion, 50 %stenosed.   Significant coronary artery disease with subtotally occluded mid right coronary collateralized from the left coronary artery (LAD and circumflex). Severe ostial obstruction in the second diagonal, 75% stenosis in the first diagonal, 70% stenosis within a region of tortuosity in the ramus intermedius, and 50% narrowing in the proximal to mid LAD. The distal LAD is diffusely diseased without focal obstruction. Low normal LV function with EF estimated to be 50%. Failed PTCA of the mid right coronary due to poor guide catheter support.   RECOMMENDATION: Begin dual antiplatelet therapy. Re-attempt PCI of the right coronary from the femoral approach on 02/11/2016. Will discuss with CTO team as well. Intensify anti-ischemic therapy.  Patient Profile     Kathy Murphy is a 87 y.o. female with a hx of CAD, chronic combined CHF (with normalization of LVEF), moderate dilation of aortic root, breast CA s/p lumpectomy, GERD, thyroid CA, HTN, HLD, hypothyroidism, obesity, unsteady gait/falls who is being seen 12/12/2020 for the evaluation of chest pain/possible NSTEMI.   Assessment & Plan     Chest pain, suspected unstable angina  CAD with hx of PCIs  - presented with chest pain - hsTroponins checked with peak of 169 - EKG at admission NSR with LVH and repolarization abnormality that is more pronounced   - continue medical therapy with ASA, heparin gtt,  crestor, and metoprolol XL  - noted losartan held and metoprolol reduced from 100 to 50 by IM due to soft BP , BP now elevated, may resume home meds / dose  - Echo from 12/12/20 showed EF 55-60%, see  report as above  - cardiac cath is planned today, NPO, further recommendation pending cath , continue gentle IVF   NSVT - noted up to 4 beats - check Mag  - continue metoprolol, will resume home dose metoprolol XL 100mg  daily   HLD - goal LDL <70, LDL 36 from 12/13/20, at goal  - continue crestor    Hx of ischemic CM 2017  - resolved LV dysfunction with EF recovered by last echo 07/2020, has not required standing diuretic previously - Echo 10/2 as above  - euvolemic today  - GDMT: continue metoprolol XL, resume losartan when BP stable   HTN - BP elevated, may resume losartan     Moderate dilation of aortic root - mild dilatation of the aortic root measuring 42 mm on Echo from 12/12/20, follow up outpatient    UTI Hx of nocturnal delirium 10/2 resolved  Hypothyroidism GERD B[place] cancer Debility  - managed per IM     For questions or updates, please contact [facility] Please consult [email or web address] for contact info under        Signed, Margie Billet, NP  12/13/2020, 9:19 AM
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
## 2020-12-14 DIAGNOSIS — I10 Essential (primary) hypertension: Secondary | ICD-10-CM | POA: Diagnosis not present

## 2020-12-14 DIAGNOSIS — E7800 Pure hypercholesterolemia, unspecified: Secondary | ICD-10-CM | POA: Diagnosis not present

## 2020-12-14 DIAGNOSIS — E876 Hypokalemia: Secondary | ICD-10-CM

## 2020-12-14 DIAGNOSIS — B9620 Unspecified Escherichia coli [E. coli] as the cause of diseases classified elsewhere: Secondary | ICD-10-CM

## 2020-12-14 DIAGNOSIS — I214 Non-ST elevation (NSTEMI) myocardial infarction: Secondary | ICD-10-CM | POA: Diagnosis not present

## 2020-12-14 LAB — CBC
HCT: 36.4 % (ref 36.0–46.0)
Hemoglobin: 11.7 g/dL — ABNORMAL LOW (ref 12.0–15.0)
MCH: 30.9 pg (ref 26.0–34.0)
MCHC: 32.1 g/dL (ref 30.0–36.0)
MCV: 96 fL (ref 80.0–100.0)
Platelets: 99 10*3/uL — ABNORMAL LOW (ref 150–400)
RBC: 3.79 MIL/uL — ABNORMAL LOW (ref 3.87–5.11)
RDW: 13.2 % (ref 11.5–15.5)
WBC: 6.9 10*3/uL (ref 4.0–10.5)
nRBC: 0 % (ref 0.0–0.2)

## 2020-12-14 LAB — RENAL FUNCTION PANEL
Albumin: 3.1 g/dL — ABNORMAL LOW (ref 3.5–5.0)
Anion gap: 8 (ref 5–15)
BUN: 25 mg/dL — ABNORMAL HIGH (ref 8–23)
CO2: 28 mmol/L (ref 22–32)
Calcium: 8.8 mg/dL — ABNORMAL LOW (ref 8.9–10.3)
Chloride: 104 mmol/L (ref 98–111)
Creatinine, Ser: 0.82 mg/dL (ref 0.44–1.00)
GFR, Estimated: >60 mL/min (ref >60)
Glucose, Bld: 109 mg/dL — ABNORMAL HIGH (ref 70–99)
Phosphorus: 2.4 mg/dL — ABNORMAL LOW (ref 2.5–4.6)
Potassium: 3.2 mmol/L — ABNORMAL LOW (ref 3.5–5.1)
Sodium: 140 mmol/L (ref 135–145)

## 2020-12-14 LAB — HEPARIN LEVEL (UNFRACTIONATED): Heparin Unfractionated: 0.39 IU/mL (ref 0.30–0.70)

## 2020-12-14 LAB — MAGNESIUM: Magnesium: 2.1 mg/dL (ref 1.7–2.4)

## 2020-12-14 MED ORDER — ENOXAPARIN SODIUM 40 MG/0.4ML IJ SOSY
40.0000 mg | PREFILLED_SYRINGE | Freq: Once | INTRAMUSCULAR | Status: AC
  Administered 2020-12-14: 40 mg via SUBCUTANEOUS
  Filled 2020-12-14: qty 0.4

## 2020-12-14 MED ORDER — CLOPIDOGREL BISULFATE 75 MG PO TABS
75.0000 mg | ORAL_TABLET | Freq: Every day | ORAL | Status: DC
  Administered 2020-12-14 – 2020-12-15 (×2): 75 mg via ORAL
  Filled 2020-12-14 (×2): qty 1

## 2020-12-14 MED ORDER — DIAZEPAM 2 MG PO TABS: 2.0000 mg | ORAL_TABLET | Freq: Two times a day (BID) | ORAL | Status: DC | PRN

## 2020-12-14 MED ORDER — ISOSORBIDE MONONITRATE ER 30 MG PO TB24
30.0000 mg | ORAL_TABLET | Freq: Every day | ORAL | Status: DC
  Administered 2020-12-14 – 2020-12-15 (×2): 30 mg via ORAL
  Filled 2020-12-14 (×2): qty 1

## 2020-12-14 MED ORDER — POTASSIUM CHLORIDE CRYS ER 20 MEQ PO TBCR
40.0000 meq | EXTENDED_RELEASE_TABLET | ORAL | Status: AC
  Administered 2020-12-14 (×2): 40 meq via ORAL
  Filled 2020-12-14: qty 2

## 2020-12-14 MED ORDER — AMLODIPINE BESYLATE 2.5 MG PO TABS
2.5000 mg | ORAL_TABLET | Freq: Every day | ORAL | Status: DC
  Administered 2020-12-14 – 2020-12-15 (×2): 2.5 mg via ORAL
  Filled 2020-12-14 (×2): qty 1

## 2020-12-14 NOTE — Evaluation (Signed)
Physical Therapy Evaluation Patient Details Name: Kathy Murphy MRN: [id number] DOB: Jan 09, 1928 Today's Date: 12/14/2020  History of Present Illness  87 year old F presenting  with progressive typical chest pain for 1 week with associated dyspnea and nausea, and admitted 12/12/20 for non-STEMI.  Troponin trended from 13-169.  She was a started on IV heparin, BB, aspirin.  Cardiology consulted, s/p LHC 10/3 . UA concerning for UTI PMH of combined CHF, CAD s/p DES, breast cancer in 2018, HTN, HLD and urinary incontinence  Clinical Impression  PTA pt reports living alone in single story home with 3 steps to enter. Pt reports independence with ambulation with RW, medication management, self care, shopping and driving. Pt limited in safe mobility by poor cognition especially very decreased safety awareness and poor knowledge of DME use, poor command follow in presence of decreased strength, balance and endurance. Pt is min guard for bed mobility, mod A for transfers and modA for 30 feet ambulation with increasing assist with distance. PT recommending SNF level rehab. PT will continue to follow acutely.      Recommendations for follow up therapy are one component of a multi-disciplinary discharge planning process, led by the attending physician.  Recommendations may be updated based on patient status, additional functional criteria and insurance authorization.  Follow Up Recommendations SNF    Equipment Recommendations  None recommended by PT (has necessary equipment)    Recommendations for Other Services OT consult     Precautions / Restrictions Precautions Precautions: Fall Restrictions Weight Bearing Restrictions: No      Mobility  Bed Mobility Overal bed mobility: Needs Assistance Bed Mobility: Supine to Sit     Supine to sit: HOB elevated;Min guard     General bed mobility comments: increased time and effort to come to EoB, however does not need any outside assist     Transfers Overall transfer level: Needs assistance Equipment used: Rolling walker (2 wheeled) Transfers: Sit to/from Stand Sit to Stand: Min assist         General transfer comment: vc for hand placement for safe power up, able to power up but relies heavily on posterior support on LE from bed and needs min A for bringing CoG over BoS  Ambulation/Gait Ambulation/Gait assistance: Mod assist Gait Distance (Feet): 30 Feet Assistive device: Rolling walker (2 wheeled) Gait Pattern/deviations: Step-through pattern;Decreased step length - right;Decreased step length - left;Drifts right/left;Narrow base of support;Scissoring;Trunk flexed Gait velocity: slowed Gait velocity interpretation: <1.31 ft/sec, indicative of household ambulator General Gait Details: pt with overall poor use of RW, constant cuing for staying inside RW, improved proximity and upright posture, pt with limited ability to follow commands, when pt begins turning in hallway pt with both LE fully outside RW requires physical assist to regain position in RW, with return to room pt with scissoring gait with B hip weakness         Balance Overall balance assessment: Needs assistance Sitting-balance support: Feet supported;Bilateral upper extremity supported;No upper extremity supported Sitting balance-Leahy Scale: Fair     Standing balance support: Bilateral upper extremity supported Standing balance-Leahy Scale: Poor Standing balance comment: requires UE support and outside assist to maintain balance                             Pertinent Vitals/Pain Pain Assessment: No/denies pain    Home Living Family/patient expects to be discharged to:: Private residence Living Arrangements: Alone Available Help at Discharge: Friend(s);Available PRN/intermittently Type  of Home: House Home Access: Stairs to enter Entrance Stairs-Rails: Left Entrance Stairs-Number of Steps: 3 Home Layout: One level Home  Equipment: Cane - single point;Walker - 2 wheels;Shower seat - built in;Grab bars - tub/shower;Hand held shower head      Prior Function Level of Independence: Independent with assistive device(s)         Comments: reports she ambulates with RW, manages her medications, heats microwave meals, and drives to store and doctors appointments     Extremity/Trunk Assessment   Upper Extremity Assessment Upper Extremity Assessment: Generalized weakness    Lower Extremity Assessment Lower Extremity Assessment: RLE deficits/detail;LLE deficits/detail RLE Deficits / Details: R TKA, decreased ROM, strength grossly 3+/5 RLE Coordination: decreased fine motor;decreased gross motor LLE Deficits / Details: ROM WFL, strength grossly 3+/5 LLE Coordination: decreased fine motor;decreased gross motor       Communication   Communication: No difficulties  Cognition Arousal/Alertness: Awake/alert Behavior During Therapy: WFL for tasks assessed/performed Overall Cognitive Status: Impaired/Different from baseline Area of Impairment: Following commands;Safety/judgement;Awareness;Problem solving                       Following Commands: Follows multi-step commands inconsistently;Follows one step commands consistently Safety/Judgement: Decreased awareness of safety;Decreased awareness of deficits Awareness: Emergent Problem Solving: Requires verbal cues;Requires tactile cues;Difficulty sequencing General Comments: A&Ox4, however requires increased cuing for safe mobility      General Comments General comments (skin integrity, edema, etc.): with sleeping on RA pt SaO2 drops  into 70%O2 and recovers back to 90%O2 with breathing, with waking pt able to maintain SaO2 >94%O2 on RA, prior to ambulation 125/109 HR 75 bpm, after ambulation HR max noted 82 bpm, BP after ambulation 138/96        Assessment/Plan    PT Assessment Patient needs continued PT services  PT Problem List Decreased  strength;Decreased range of motion;Decreased activity tolerance;Decreased balance;Decreased mobility;Decreased coordination;Decreased cognition;Decreased safety awareness;Decreased knowledge of use of DME;Cardiopulmonary status limiting activity;Pain       PT Treatment Interventions DME instruction;Gait training;Functional mobility training;Therapeutic activities;Therapeutic exercise;Balance training;Cognitive remediation;Patient/family education    PT Goals (Current goals can be found in the Care Plan section)  Acute Rehab PT Goals Patient Stated Goal: go home PT Goal Formulation: With patient Time For Goal Achievement: 12/28/20 Potential to Achieve Goals: Fair    Frequency Min 2X/week   Barriers to discharge Decreased caregiver support         AM-PAC PT "6 Clicks" Mobility  Outcome Measure Help needed turning from your back to your side while in a flat bed without using bedrails?: None Help needed moving from lying on your back to sitting on the side of a flat bed without using bedrails?: None Help needed moving to and from a bed to a chair (including a wheelchair)?: A Lot Help needed standing up from a chair using your arms (e.g., wheelchair or bedside chair)?: A Lot Help needed to walk in hospital room?: A Lot Help needed climbing 3-5 steps with a railing? : Total 6 Click Score: 15    End of Session Equipment Utilized During Treatment: Gait belt Activity Tolerance: Patient limited by fatigue Patient left: in chair;with call bell/phone within reach;with chair alarm set Nurse Communication: Mobility status PT Visit Diagnosis: Unsteadiness on feet (R26.81);Other abnormalities of gait and mobility (R26.89);Muscle weakness (generalized) (M62.81);History of falling (Z91.81);Difficulty in walking, not elsewhere classified (R26.2)    Time: 5361-4431 PT Time Calculation (min) (ACUTE ONLY): 26 min   Charges:  PT Evaluation $PT Eval Moderate Complexity: 1 Mod PT  Treatments $Gait Training: 8-22 mins        Kamaiya Antilla B. Migdalia Dk PT, DPT Acute Rehabilitation Services Pager [phone] Office [phone]   [place] 12/14/2020, 12:33 PM

## 2020-12-14 NOTE — NC FL2 (Signed)
[place] LEVEL OF CARE SCREENING TOOL     IDENTIFICATION  Patient Name: Kathy Murphy Birthdate: 14-Oct-1927 Sex: female Admission Date (Current Location): 12/11/2020  [facility] and [place] Number:  Herbalist and Address:  The . [facility], [place] [address], [place], [place] [address]      Provider Number: [id number]  Attending Physician Name and Address:  Mercy Riding, MD  Relative Name and Phone Number:  [name] [facility])   [phone]    Current Level of Care: Hospital Recommended Level of Care: [place] Prior Approval Number:    Date Approved/Denied:   PASRR Number:    Discharge Plan: SNF    Current Diagnoses: Patient Active Problem List   Diagnosis Date Noted   Thrombocytopenia ([place]) 12/12/2020   Leukocytosis 12/12/2020   Frequent falls 12/12/2020   Chest pain 12/12/2020   Chest pain with high risk for cardiac etiology    Status post total knee replacement, right 05/18/2017   Dyslipidemia 04/05/2017   Obesity    Myocardial infarction [facility])    Incontinence of urine    Hypertension    Seroma of breast 01/08/2017   Malignant neoplasm of upper-outer quadrant of right breast in female, estrogen receptor positive ([place]) 08/31/2016   CHF (congestive heart failure), NYHA class I, chronic, combined ([place]) 04/25/2016   Essential hypertension 04/25/2016   CAD (coronary artery disease), native coronary artery 02/11/2016   Chronic total occlusion of coronary artery    Coronary artery disease    Unstable angina pectoris (H[facility])    NSTEMI (non-ST elevated myocardial infarction) ([place]) 02/07/2016   Abnormal nuclear cardiac imaging test 02/07/2016   Nipple discharge in female 04/17/2011   Heart disease, hypertensive    Hypercholesterolemia    Arthritis    Unsteady gait    Myalgia    Pain in joints    Retinal detachment    Hypothyroidism    GERD (gastroesophageal reflux disease)    UTI (urinary tract  infection)    Vomiting    Overweight (BMI 25.0-29.9)    GI bleed     Orientation RESPIRATION BLADDER Height & Weight     Self, Time, Situation, Place  Normal Continent, External catheter Weight: 183 lb 13.8 oz (83.4 kg) Height:  5\' 9"  (175.3 cm)  BEHAVIORAL SYMPTOMS/MOOD NEUROLOGICAL BOWEL NUTRITION STATUS      Continent Diet  AMBULATORY STATUS COMMUNICATION OF NEEDS Skin   Limited Assist Verbally Normal                       Personal Care Assistance Level of Assistance  Bathing, Feeding, Dressing Bathing Assistance: Limited assistance Feeding assistance: Independent Dressing Assistance: Limited assistance     Functional Limitations Info  Sight, Speech, Hearing Sight Info: Impaired Hearing Info: Adequate Speech Info: Adequate    SPECIAL CARE FACTORS FREQUENCY  PT (By licensed PT), OT (By licensed OT)     PT Frequency: 5x a week OT Frequency: 5x a week            Contractures Contractures Info: Not present    Additional Factors Info  Code Status, Allergies Code Status Info: Full Allergies Info: Other   Adhesive (Tape)   Latex           Current Medications (12/14/2020):  This is the current hospital active medication list Current Facility-Administered Medications  Medication Dose Route Frequency Provider Last Rate Last Admin   0.9 %  sodium chloride infusion  250 mL Intravenous PRN Orma Flaming, MD   Stopped at 12/14/20 1006   0.9 %  sodium chloride infusion  250 mL Intravenous PRN Charlie Pitter, PA-C   Stopped at 12/14/20 1006   0.9% sodium chloride infusion  1 mL/kg/hr Intravenous Continuous Dunn, Dayna N, PA-C   Stopped at 12/14/20 1006   acetaminophen (TYLENOL) tablet 650 mg  650 mg Oral Q4H PRN Orma Flaming, MD   650 mg at 12/13/20 1717   amLODipine (NORVASC) tablet 2.5 mg  2.5 mg Oral Daily Skeet Latch, MD   2.5 mg at 12/14/20 1012   anastrozole (ARIMIDEX) tablet 1 mg  1 mg Oral Daily Orma Flaming, MD   1 mg at 12/14/20 0849   aspirin  EC tablet 81 mg  81 mg Oral Daily Orma Flaming, MD   81 mg at 12/14/20 0849   cefTRIAXone (ROCEPHIN) 1 g in sodium chloride 0.9 % 100 mL IVPB  1 g Intravenous Q24H Rise Patience, MD   Stopped at 12/14/20 0700   cholecalciferol (VITAMIN D3) tablet 5,000 Units  5,000 Units Oral Daily Orma Flaming, MD   5,000 Units at 12/14/20 0848   clopidogrel (PLAVIX) tablet 75 mg  75 mg Oral Daily Skeet Latch, MD   75 mg at 12/14/20 1012   diazepam (VALIUM) tablet 2 mg  2 mg Oral Q6H PRN Orma Flaming, MD   2 mg at 12/14/20 0108   gabapentin (NEURONTIN) capsule 200 mg  200 mg Oral QHS Wendee Beavers T, MD   200 mg at 12/13/20 2300   isosorbide mononitrate (IMDUR) 24 hr tablet 30 mg  30 mg Oral Daily Skeet Latch, MD   30 mg at 12/14/20 1012   levothyroxine (SYNTHROID) tablet 125 mcg  125 mcg Oral Q0600 Orma Flaming, MD   125 mcg at 12/14/20 0524   metoprolol succinate (TOPROL-XL) 24 hr tablet 100 mg  100 mg Oral Daily Margie Billet, NP   100 mg at 12/14/20 0849   morphine 2 MG/ML injection 1 mg  1 mg Intravenous Q4H PRN Orma Flaming, MD       nitroGLYCERIN (NITROSTAT) SL tablet 0.4 mg  0.4 mg Sublingual Q5 Min x 3 PRN Deno Etienne, DO   0.4 mg at 12/11/20 2322   ondansetron (ZOFRAN) injection 4 mg  4 mg Intravenous Q6H PRN Orma Flaming, MD       pantoprazole (PROTONIX) EC tablet 40 mg  40 mg Oral Daily Orma Flaming, MD   40 mg at 12/14/20 0849   polyvinyl alcohol (LIQUIFILM TEARS) 1.4 % ophthalmic solution 1 drop  1 drop Both Eyes Daily Orma Flaming, MD   1 drop at 12/14/20 0849   potassium chloride SA (KLOR-CON) CR tablet 40 mEq  40 mEq Oral Q4H Wendee Beavers T, MD   40 mEq at 12/14/20 1413   rosuvastatin (CRESTOR) tablet 10 mg  10 mg Oral Daily Orma Flaming, MD   10 mg at 12/14/20 0849   sodium chloride flush (NS) 0.9 % injection 3 mL  3 mL Intravenous Q12H Orma Flaming, MD   3 mL at 12/13/20 0931   sodium chloride flush (NS) 0.9 % injection 3 mL  3 mL Intravenous PRN Orma Flaming, MD        sodium chloride flush (NS) 0.9 % injection 3 mL  3 mL Intravenous Q12H Dunn, Dayna N, PA-C   3 mL at 12/14/20 0850   sodium chloride flush (NS) 0.9 % injection 3 mL  3 mL Intravenous PRN Dunn,  Nedra Hai, PA-C         Discharge Medications: Please see discharge summary for a list of discharge medications.  Relevant Imaging Results:  Relevant Lab Results:   Additional Information SS#: [id number]; [place] COVID-19 Vaccine 12/16/2019 , 04/24/2019 , 04/03/2019  Reece Agar, LCSWA

## 2020-12-14 NOTE — Progress Notes (Signed)
PROGRESS NOTE  Kathy Murphy GDJ:[id number] DOB: [birth year]   PCP: Burnard Bunting, MD  Patient is from: Home.  Lives alone.  Independently ambulates at baseline.  DOA: 12/11/2020 LOS: 2  Chief complaints:  Chief Complaint  Patient presents with   Chest Pain     Brief Narrative / Interim history: 87 year old F with PMH of combined CHF, CAD s/p DES, breast cancer in 2018, HTN, HLD and urinary incontinence presenting with progressive typical chest pain for 1 week with associated dyspnea and nausea, and admitted for non-STEMI.  Troponin trended from 13-169.  She was a started on IV heparin, BB, aspirin.  Cardiology consulted, and planning LHC.  TTE with LVEF of 55 to 60%, R WMA,  G1-DD and RVSP of 48.4 mmHg.  Patient had an episode of encephalopathy with confusion and combativeness overnight.  UA concerning for UTI.  Urine culture obtained and she was started on ceftriaxone.  She was restless on cath lab bed and did not undergo LHC.  Now the plan is medical management.   Subjective: Seen and examined earlier this morning.  No major events overnight of this morning.  Somewhat sleepy but wakes to voice easily.  She has no complaints other than dry mouth from breathing through her mouth while asleep.  She denies chest pain, shortness of breath, GI or UTI symptoms.  She is oriented x4 except date.  Objective: Vitals:   12/14/20 0000 12/14/20 0501 12/14/20 0526 12/14/20 1200  BP: (!) 146/82 (!) 150/73  (!) 108/45  Pulse: 68 65  73  Resp: (!) 21 16  19   Temp: 98.1 F (36.7 C)  98.4 F (36.9 C) 98.2 F (36.8 C)  TempSrc: Oral  Oral Oral  SpO2: 100% 99%  94%  Weight:   83.4 kg   Height:        Intake/Output Summary (Last 24 hours) at 12/14/2020 1612 Last data filed at 12/14/2020 1300 Gross per 24 hour  Intake 2321.66 ml  Output 900 ml  Net 1421.66 ml   Filed Weights   12/11/20 2301 12/13/20 0408 12/14/20 0526  Weight: 86.2 kg 80.3 kg 83.4 kg    Examination:  GENERAL: No  apparent distress.  Nontoxic. HEENT: Dry oral mucous membrane.  Vision and hearing grossly intact.  NECK: Supple.  No apparent JVD.  RESP: 94% on RA.  No IWOB.  Fair aeration bilaterally. CVS:  RRR. Heart sounds normal.  ABD/GI/GU: BS+. Abd soft, NTND.  MSK/EXT:  Moves extremities. No apparent deformity. No edema.  SKIN: no apparent skin lesion or wound NEURO: Sleepy but wakes to voice easily.  Oriented x4 except date.  No apparent focal neuro deficit. PSYCH: Calm. Normal affect.   Procedures:  None  Microbiology summarized: [id number] and influenza PCR nonreactive. Urine culture with E. coli  Assessment & Plan: Typical chest pain/Non-STEMI-history of CAD with DES stent.  Troponin trended from 13-169.  EKG on 10/1 with first-degree AVB and downslanting ST depression in lateral leads which seems to be new.  Repeat EKG with AVB-1 but no acute ischemic finding.Marland Kitchen TTE with LVEF of 55 to 60%, R WMA,  G1-DD and RVSP of 48.4 mmHg.  LHC cancelled as she couldn't lie still. LDL 36.  Currently chest pain-free. -Cardiology managing -IV heparin discontinued. -Started on DAPT for 12 months -Started low-dose Imdur  E. coli UTI: Patient with history of incontinence but no acute UTI symptoms.  UA concerning.  Urine culture with E. coli. -Continue ceftriaxone -Follow culture sensitivity and de-escalate antibiotic as  appropriate  Frequent falls/ambulatory dysfunction-reportedly had 3 falls recently.  No signs of injury.  CT head without acute finding.  No focal neurodeficit but seems to be restless.  Lives alone. -Therapy recommended SNF.  Chronic combined CHF with recovered EF: TTE as above.  Appears euvolemic.  Not on diuretics at home. -Monitor fluid status  Acute metabolic encephalopathy-could be due to UTI or delirium.  She is takes Valium 2 mg every 6 hours as needed.  She had 1 dose last night.  Seems to have resolved. -Reorientation and delirium precautions -Treat treatable causes -Consider  cutting down on Valium moving forward  Essential hypertension: -Cardiac meds as above.   Hypothyroidism: TSH within normal. -Continue home Synthroid   Mild leukocytosis: Resolved.  Thrombocytopenia: Slightly lower.  Could be due to E. coli UTI -Continue monitoring  Hypokalemia: K3.2. -K-Dur 40 mill equivalent x2  GERD -Continue Protonix   Estrogen positive breast cancer Continue arimidex    Bilateral lower extremity pain/restlessness-RLS?  Resolved. -Tylenol as needed -Continue low-dose gabapentin at night   Body mass index is 27.15 kg/m.         DVT prophylaxis:  SCD in the setting of thrombocytopenia if he tolerates  Code Status: Full code Family Communication: Updated her good friend from church at bedside on 10/3.  None at bedside today. Level of care: Telemetry Cardiac Status is: Inpatient  Remains inpatient appropriate because:Unsafe d/c plan, IV treatments appropriate due to intensity of illness or inability to take PO, and Inpatient level of care appropriate due to severity of illness  Dispo: The patient is from: Home              Anticipated d/c is to:  SNF              Patient currently is not medically stable to d/c.   Difficult to place patient No       Consultants:  Cardiology   Sch Meds:  Scheduled Meds:  amLODipine  2.5 mg Oral Daily   anastrozole  1 mg Oral Daily   aspirin EC  81 mg Oral Daily   cholecalciferol  5,000 Units Oral Daily   clopidogrel  75 mg Oral Daily   gabapentin  200 mg Oral QHS   isosorbide mononitrate  30 mg Oral Daily   levothyroxine  125 mcg Oral Q0600   metoprolol succinate  100 mg Oral Daily   pantoprazole  40 mg Oral Daily   polyvinyl alcohol  1 drop Both Eyes Daily   potassium chloride  40 mEq Oral Q4H   rosuvastatin  10 mg Oral Daily   sodium chloride flush  3 mL Intravenous Q12H   sodium chloride flush  3 mL Intravenous Q12H   Continuous Infusions:  sodium chloride Stopped (12/14/20 1006)   sodium  chloride Stopped (12/14/20 1006)   sodium chloride Stopped (12/14/20 1006)   cefTRIAXone (ROCEPHIN)  IV Stopped (12/14/20 0700)   PRN Meds:.sodium chloride, sodium chloride, acetaminophen, diazepam, morphine injection, nitroGLYCERIN, ondansetron (ZOFRAN) IV, sodium chloride flush, sodium chloride flush  Antimicrobials: Anti-infectives (From admission, onward)    Start     Dose/Rate Route Frequency Ordered Stop   12/13/20 0500  cefTRIAXone (ROCEPHIN) 1 g in sodium chloride 0.9 % 100 mL IVPB        1 g 200 mL/hr over 30 Minutes Intravenous Every 24 hours 12/13/20 0429          I have personally reviewed the following labs and images: CBC: Recent Labs  Lab  12/11/20 2313 12/13/20 0042 12/13/20 0536 12/14/20 0307  WBC 11.4* ACCURACY OF RESULTS QUESTIONABLE. RECOMMEND RECOLLECT TO VERIFY. 12.8* 6.9  HGB 13.0 ACCURACY OF RESULTS QUESTIONABLE. RECOMMEND RECOLLECT TO VERIFY. 12.0 11.7*  HCT 40.1 ACCURACY OF RESULTS QUESTIONABLE. RECOMMEND RECOLLECT TO VERIFY. 36.4 36.4  MCV 95.2 ACCURACY OF RESULTS QUESTIONABLE. RECOMMEND RECOLLECT TO VERIFY. 95.3 96.0  PLT 128* ACCURACY OF RESULTS QUESTIONABLE. RECOMMEND RECOLLECT TO VERIFY. 104* 99*   BMP &GFR Recent Labs  Lab 12/11/20 2313 12/12/20 1144 12/13/20 0536 12/13/20 0943 12/14/20 0307  NA 139 140 138  --  140  K 3.5 3.6 4.2  --  3.2*  CL 105 108 105  --  104  CO2 25 20* 24  --  28  GLUCOSE 185* 143* 137*  --  109*  BUN 28* 22 21  --  25*  CREATININE 0.77 0.90 0.84  --  0.82  CALCIUM 8.9 8.9 8.9  --  8.8*  MG  --  2.0  --  1.9 2.1  PHOS  --   --   --   --  2.4*   Estimated Creatinine Clearance: 51.6 mL/min (by C-G formula based on SCr of 0.82 mg/dL). Liver & Pancreas: Recent Labs  Lab 12/11/20 2313 12/14/20 0307  AST 15  --   ALT 11  --   ALKPHOS 83  --   BILITOT 0.9  --   PROT 6.7  --   ALBUMIN 4.1 3.1*   Recent Labs  Lab 12/11/20 2313  LIPASE 30   No results for input(s): AMMONIA in the last 168  hours. Diabetic: Recent Labs    12/12/20 1144  HGBA1C 5.5   Recent Labs  Lab 12/11/20 2334  GLUCAP 126*   Cardiac Enzymes: No results for input(s): CKTOTAL, CKMB, CKMBINDEX, TROPONINI in the last 168 hours. No results for input(s): PROBNP in the last 8760 hours. Coagulation Profile: No results for input(s): INR, PROTIME in the last 168 hours. Thyroid Function Tests: Recent Labs    12/12/20 1435  TSH 0.432   Lipid Profile: Recent Labs    12/13/20 0536  CHOL 82  HDL 40*  LDLCALC 36  TRIG 31  CHOLHDL 2.1   Anemia Panel: No results for input(s): VITAMINB12, FOLATE, FERRITIN, TIBC, IRON, RETICCTPCT in the last 72 hours. Urine analysis:    Component Value Date/Time   COLORURINE AMBER (A) 12/12/2020 2337   APPEARANCEUR CLOUDY (A) 12/12/2020 2337   LABSPEC 1.018 12/12/2020 2337   PHURINE 5.0 12/12/2020 2337   GLUCOSEU 50 (A) 12/12/2020 2337   HGBUR LARGE (A) 12/12/2020 2337   BILIRUBINUR NEGATIVE 12/12/2020 2337   [place] 12/12/2020 2337   PROTEINUR 100 (A) 12/12/2020 2337   UROBILINOGEN 0.2 12/26/2012 0843   NITRITE POSITIVE (A) 12/12/2020 2337   LEUKOCYTESUR LARGE (A) 12/12/2020 2337   Sepsis Labs: Invalid input(s): PROCALCITONIN, [place]  Microbiology: Recent Results (from the past 240 hour(s))  Resp Panel by RT-PCR (Flu A&B, Covid) Nasopharyngeal Swab     Status: None   Collection Time: 12/12/20 12:03 AM   Specimen: Nasopharyngeal Swab; Nasopharyngeal(NP) swabs in vial transport medium  Result Value Ref Range Status   SARS Coronavirus 2 by RT PCR NEGATIVE NEGATIVE Final    Comment: (NOTE) SARS-CoV-2 target nucleic acids are NOT DETECTED.  The SARS-CoV-2 RNA is generally detectable in upper respiratory specimens during the acute phase of infection. The lowest concentration of SARS-CoV-2 viral copies this assay can detect is 138 copies/mL. A negative result does not preclude SARS-Cov-2 infection  and should not be used as the sole basis for  treatment or other patient management decisions. A negative result may occur with  improper specimen collection/handling, submission of specimen other than nasopharyngeal swab, presence of viral mutation(s) within the areas targeted by this assay, and inadequate number of viral copies(<138 copies/mL). A negative result must be combined with clinical observations, patient history, and epidemiological information. The expected result is Negative.  Fact Sheet for Patients:  EntrepreneurPulse.com.au  Fact Sheet for Healthcare Providers:  IncredibleEmployment.be  This test is no t yet approved or cleared by the [place] FDA and  has been authorized for detection and/or diagnosis of SARS-CoV-2 by FDA under an Emergency Use Authorization (EUA). This EUA will remain  in effect (meaning this test can be used) for the duration of the COVID-19 declaration under Section 564(b)(1) of the Act, 21 U.S.C.section 360bbb-3(b)(1), unless the authorization is terminated  or revoked sooner.       Influenza A by PCR NEGATIVE NEGATIVE Final   Influenza B by PCR NEGATIVE NEGATIVE Final    Comment: (NOTE) The Xpert Xpress SARS-CoV-2/FLU/RSV plus assay is intended as an aid in the diagnosis of influenza from Nasopharyngeal swab specimens and should not be used as a sole basis for treatment. Nasal washings and aspirates are unacceptable for Xpert Xpress SARS-CoV-2/FLU/RSV testing.  Fact Sheet for Patients: EntrepreneurPulse.com.au  Fact Sheet for Healthcare Providers: IncredibleEmployment.be  This test is not yet approved or cleared by the [place] FDA and has been authorized for detection and/or diagnosis of SARS-CoV-2 by FDA under an Emergency Use Authorization (EUA). This EUA will remain in effect (meaning this test can be used) for the duration of the COVID-19 declaration under Section 564(b)(1) of the Act, 21  U.S.C. section 360bbb-3(b)(1), unless the authorization is terminated or revoked.  Performed at [facility], [address] [place], [place] [address]   Urine Culture     Status: Abnormal (Preliminary result)   Collection Time: 12/12/20 11:21 PM   Specimen: Urine, Clean Catch  Result Value Ref Range Status   Specimen Description URINE, CLEAN CATCH  Final   Special Requests NONE  Final   Culture (A)  Final    >=100,000 COLONIES/mL ESCHERICHIA COLI SUSCEPTIBILITIES TO FOLLOW Performed at [facility], [address], [place], [place] [address]    Report Status PENDING  Incomplete    Radiology Studies: No results found.     Srah Ake T. [place]  If 7PM-7AM, please contact night-coverage [email or web address] 12/14/2020, 4:12 PM

## 2020-12-14 NOTE — TOC Initial Note (Signed)
Transition of Care [facility]) - Initial/Assessment Note    Patient Details  Name: Kathy Murphy MRN: [id number] Date of Birth: [birth year]  [facility] CM/SW Contact:    Tresa Endo Phone Number: 12/14/2020, 3:09 PM  Clinical Narrative:                 CSW received SNF consult. CSW met with pt at bedside pt was still a little confused. CSW reached out to pt emergency contacts who states pt has been to [place] in the past she will likely want to go back there bc she has a friend that lives out there as well. CSW introduced self and explained role at the hospital. Pt reports that PTA the fully took care of herself, driving, cooking, shopping, self care, etc. Pt has 3-5 stairs to enter home. PT reports pt used a two wheeled RW with modA and walked 7f. Pt has a click score of 15.  CSW reviewed PT/OT recommendations for SNF. Pt reports being okay with going to a SNF at [place]. Pt gave CSW permission to fax out to facilities in the area. CSW gave pt medicare.gov rating list to review.   CSW will continue to follow.    Expected Discharge Plan: Skilled Nursing Facility Barriers to Discharge: Continued Medical Work up   Patient Goals and CMS Choice Patient states their goals for this hospitalization and ongoing recovery are:: Rehab CMS Medicare.gov Compare Post Acute Care list provided to:: Patient Choice offered to / list presented to : Patient  Expected Discharge Plan and Services Expected Discharge Plan: [place]house Referral: Clinical Social Work   Post Acute Care [place]: [place] for the past 2 months: Apartment                                      Prior Living Arrangements/Services Living arrangements for the past 2 months: Apartment Lives with:: Self Patient language and need for interpreter reviewed:: Yes Do you feel safe going back to the place where you live?: Yes      Need for Family  Participation in Patient Care: Yes (Comment) Care giver support system in place?: Yes (comment)   Criminal Activity/Legal Involvement Pertinent to Current Situation/Hospitalization: No - Comment as needed  Activities of Daily Living Home Assistive Devices/Equipment: WGilford Rile(specify type) ADL Screening (condition at time of admission) Patient's cognitive ability adequate to safely complete daily activities?: Yes Is the patient deaf or have difficulty hearing?: No Does the patient have difficulty seeing, even when wearing glasses/contacts?: No Does the patient have difficulty concentrating, remembering, or making decisions?: No Patient able to express need for assistance with ADLs?: Yes Does the patient have difficulty dressing or bathing?: No Independently performs ADLs?: Yes (appropriate for developmental age) Does the patient have difficulty walking or climbing stairs?: Yes Weakness of Legs: Both Weakness of Arms/Hands: None  Permission Sought/Granted Permission sought to share information with : Facility CSport and exercise psychologist Other (comment) Permission granted to share information with : Yes, Verbal Permission Granted  Share Information with NAME: AIzora Ribas(Friend)   [phone] Permission granted to share info w AGENCY: SNF  Permission granted to share info w Relationship: AIzora Ribas(Friend)   [phone] Permission granted to share info w Contact Information: AIzora Ribas([facility]   [phone] Emotional Assessment Appearance:: Appears stated age Attitude/Demeanor/Rapport: Unable to Assess Affect (typically observed):  Unable to Assess Orientation: : Oriented to Self, Oriented to Place, Oriented to  Time, Oriented to Situation Alcohol / Substance Use: Not Applicable Psych Involvement: No (comment)  Admission diagnosis:  Chest pain [R07.9] Chest pain with high risk for cardiac etiology [R07.9] Patient Active Problem List   Diagnosis Date Noted    Thrombocytopenia ([place]) 12/12/2020   Leukocytosis 12/12/2020   Frequent falls 12/12/2020   Chest pain 12/12/2020   Chest pain with high risk for cardiac etiology    Status post total knee replacement, right 05/18/2017   Dyslipidemia 04/05/2017   Obesity    Myocardial infarction ([place])    Incontinence of urine    Hypertension    Seroma of breast 01/08/2017   Malignant neoplasm of upper-outer quadrant of right breast in female, estrogen receptor positive ([place]) 08/31/2016   CHF (congestive heart failure), NYHA class I, chronic, combined ([place]) 04/25/2016   Essential hypertension 04/25/2016   CAD (coronary artery disease), native coronary artery 02/11/2016   Chronic total occlusion of coronary artery    Coronary artery disease    Unstable angina pectoris (HCC)    NSTEMI (non-ST elevated myocardial infarction) ([place]) 02/07/2016   Abnormal nuclear cardiac imaging test 02/07/2016   Nipple discharge in female 04/17/2011   Heart disease, hypertensive    Hypercholesterolemia    Arthritis    Unsteady gait    Myalgia    Pain in joints    Retinal detachment    Hypothyroidism    GERD (gastroesophageal reflux disease)    UTI (urinary tract infection)    Vomiting    Overweight (BMI 25.0-29.9)    GI bleed    PCP:  Burnard Bunting, MD Pharmacy:   [facility], [place] [address] Phone: [phone] Fax: [phone]     Social Determinants of Health (SDOH) Interventions    Readmission Risk Interventions No flowsheet data found.

## 2020-12-14 NOTE — Progress Notes (Signed)
Mobility Specialist Progress Note    12/14/20 1558  Mobility  Activity Ambulated in hall  Level of Assistance Minimal assist, patient does 75% or more  Assistive Device Front wheel walker  Distance Ambulated (ft) 480 ft  Mobility Ambulated with assistance in hallway  Mobility Response Tolerated well  Mobility performed by Mobility specialist  Bed Position Chair  $Mobility charge 1 Mobility   Pt received in bed and agreeable to ambulate. Pt requires significant contact guarding and cues to slow down so her feet cross less and to look straight ahead. Pt leans to both sides. Got SOB towards end from talking and being distracted. Returned to chair with lap band and call bell in reach.   Hildred Alamin Mobility Specialist  Mobility Specialist Phone: [phone]

## 2020-12-14 NOTE — Progress Notes (Signed)
ANTICOAGULATION CONSULT NOTE - Follow Up Consult  Pharmacy Consult for Heparin Indication: chest pain/ACS  Allergies  Allergen Reactions   Other Other (See Comments)    SOME OF THE "MYCINS"...UNKNOWN REACTION.   Adhesive [Tape] Rash   Latex Rash    Patient Measurements: Height: 5\' 9"  (175.3 cm) Weight: 83.4 kg (183 lb 13.8 oz) IBW/kg (Calculated) : 66.2 Heparin Dosing Weight: 83 kg  Vital Signs: Temp: 98.4 F (36.9 C) (10/04 0526) Temp Source: Oral (10/04 0526) BP: 150/73 (10/04 0501) Pulse Rate: 65 (10/04 0501)  Labs: Recent Labs    12/11/20 2313 12/12/20 0514 12/12/20 0925 12/12/20 1144 12/12/20 2220 12/13/20 0042 12/13/20 0536 12/13/20 1247 12/14/20 0307  HGB  --   --   --   --   --  ACCURACY OF RESULTS QUESTIONABLE. RECOMMEND RECOLLECT TO VERIFY. 12.0  --  11.7*  HCT  --   --   --   --   --  ACCURACY OF RESULTS QUESTIONABLE. RECOMMEND RECOLLECT TO VERIFY. 36.4  --  36.4  PLT  --   --   --   --   --  ACCURACY OF RESULTS QUESTIONABLE. RECOMMEND RECOLLECT TO VERIFY. 104*  --  99*  HEPARINUNFRC   < >  --   --  0.26* 0.34  --   --  0.19* 0.39  CREATININE  --   --   --  0.90  --   --  0.84  --  0.82  TROPONINIHS  --  95* 168* 169*  --   --   --   --   --    < > = values in this interval not displayed.    Estimated Creatinine Clearance: 51.6 mL/min (by C-G formula based on SCr of 0.82 mg/dL).   Assessment: 87 y.o. female presented 12/11/20 pm with CP. No AC PTA. H/H ok on admission, platelets 128. Pharmacy consulted on 10/2 for heparin dosing for ACS.   Today the heparin level is therapeutic (0.39) on 1350 units/hr. Hgb slight decrease to 11.7,  pltc 128 prior to start of heparin , trending down >104>99. No bleeding reported.  For cardiac cath today.  Goal of Therapy:  Heparin level 0.3-0.7 units/ml Monitor platelets by anticoagulation protocol: Yes   Plan:  Continue heparin drip 1350 units/hr Possible cath today Will follow up post-cath. Daily heparin  level and CBC while on heparin.  Thank you for allowing pharmacy to be part of this patients care team.  Nicole Cella, [place] Pharmacist [phone] 12/14/2020,7:57 AM Please check AMION for all [place] phone numbers After 10:00 PM, call [place] [phone]

## 2020-12-14 NOTE — Progress Notes (Signed)
Progress Note  Patient Name: Kathy Murphy Date of Encounter: 12/14/2020  [facility] Cardiologist: Sanda Klein, MD   Subjective   Feeling OK.  Denies issues with confusion at home.  She reports that she was unable to lay still for cath yesterday because of discomfort in her leg.  It is hard for her to lay flat.  Inpatient Medications    Scheduled Meds:  anastrozole  1 mg Oral Daily   aspirin EC  81 mg Oral Daily   cholecalciferol  5,000 Units Oral Daily   clopidogrel  75 mg Oral Daily   enoxaparin  40 mg Subcutaneous Once   gabapentin  200 mg Oral QHS   isosorbide mononitrate  30 mg Oral Daily   levothyroxine  125 mcg Oral Q0600   metoprolol succinate  100 mg Oral Daily   pantoprazole  40 mg Oral Daily   polyvinyl alcohol  1 drop Both Eyes Daily   rosuvastatin  10 mg Oral Daily   sodium chloride flush  3 mL Intravenous Q12H   sodium chloride flush  3 mL Intravenous Q12H   Continuous Infusions:  sodium chloride     sodium chloride     sodium chloride 0.116 mL/kg/hr (12/14/20 0048)   cefTRIAXone (ROCEPHIN)  IV 1 g (12/14/20 0524)   PRN Meds: sodium chloride, sodium chloride, acetaminophen, diazepam, morphine injection, nitroGLYCERIN, ondansetron (ZOFRAN) IV, sodium chloride flush, sodium chloride flush   Vital Signs    Vitals:   12/13/20 2053 12/14/20 0000 12/14/20 0501 12/14/20 0526  BP: (!) 129/47 (!) 146/82 (!) 150/73   Pulse: 72 68 65   Resp: (!) 23 (!) 21 16   Temp: 97.7 F (36.5 C) 98.1 F (36.7 C)  98.4 F (36.9 C)  TempSrc: Oral Oral  Oral  SpO2: 100% 100% 99%   Weight:    83.4 kg  Height:        Intake/Output Summary (Last 24 hours) at 12/14/2020 0955 Last data filed at 12/14/2020 0524 Gross per 24 hour  Intake 2321.66 ml  Output 900 ml  Net 1421.66 ml   Last 3 Weights 12/14/2020 12/13/2020 12/11/2020  Weight (lbs) 183 lb 13.8 oz 177 lb 0.5 oz 190 lb  Weight (kg) 83.4 kg 80.3 kg 86.183 kg      Telemetry    Sinus rhythm.  PVCs.  -  Personally Reviewed  ECG     Sinus rhythm.  Rate 77 bpm.  First-degree AV block.  LVH with secondary repolarization abnormality incomplete left bundle branch block.- Personally Reviewed  Physical Exam   GEN: No acute distress.   Neck: No JVD Cardiac: RRR, no murmurs, rubs, or gallops.  Respiratory: Clear to auscultation bilaterally. GI: Soft, nontender, non-distended  MS: No edema; No deformity. Neuro:  Nonfocal  Psych: Normal affect   Labs    High Sensitivity Troponin:   Recent Labs  Lab 12/12/20 0113 12/12/20 0329 12/12/20 0514 12/12/20 0925 12/12/20 1144  TROPONINIHS 64* 64* 95* 168* 169*     Chemistry Recent Labs  Lab 12/11/20 2313 12/12/20 1144 12/13/20 0536 12/13/20 0943 12/14/20 0307  NA 139 140 138  --  140  K 3.5 3.6 4.2  --  3.2*  CL 105 108 105  --  104  CO2 25 20* 24  --  28  GLUCOSE 185* 143* 137*  --  109*  BUN 28* 22 21  --  25*  CREATININE 0.77 0.90 0.84  --  0.82  CALCIUM 8.9 8.9 8.9  --  8.8*  MG  --  2.0  --  1.9 2.1  PROT 6.7  --   --   --   --   ALBUMIN 4.1  --   --   --  3.1*  AST 15  --   --   --   --   ALT 11  --   --   --   --   ALKPHOS 83  --   --   --   --   BILITOT 0.9  --   --   --   --   GFRNONAA >60 >60 >60  --  >60  ANIONGAP 9 12 9   --  8    Lipids  Recent Labs  Lab 12/13/20 0536  CHOL 82  TRIG 31  HDL 40*  LDLCALC 36  CHOLHDL 2.1    Hematology Recent Labs  Lab 12/13/20 0042 12/13/20 0536 12/14/20 0307  WBC ACCURACY OF RESULTS QUESTIONABLE. RECOMMEND RECOLLECT TO VERIFY. 12.8* 6.9  RBC ACCURACY OF RESULTS QUESTIONABLE. RECOMMEND RECOLLECT TO VERIFY. 3.82* 3.79*  HGB ACCURACY OF RESULTS QUESTIONABLE. RECOMMEND RECOLLECT TO VERIFY. 12.0 11.7*  HCT ACCURACY OF RESULTS QUESTIONABLE. RECOMMEND RECOLLECT TO VERIFY. 36.4 36.4  MCV ACCURACY OF RESULTS QUESTIONABLE. RECOMMEND RECOLLECT TO VERIFY. 95.3 96.0  MCH ACCURACY OF RESULTS QUESTIONABLE. RECOMMEND RECOLLECT TO VERIFY. 31.4 30.9  MCHC ACCURACY OF RESULTS  QUESTIONABLE. RECOMMEND RECOLLECT TO VERIFY. 33.0 32.1  RDW ACCURACY OF RESULTS QUESTIONABLE. RECOMMEND RECOLLECT TO VERIFY. 13.2 13.2  PLT ACCURACY OF RESULTS QUESTIONABLE. RECOMMEND RECOLLECT TO VERIFY. 104* 99*   Thyroid  Recent Labs  Lab 12/12/20 1435  TSH 0.432    BNPNo results for input(s): BNP, PROBNP in the last 168 hours.  DDimer No results for input(s): DDIMER in the last 168 hours.   Radiology    CT HEAD WO CONTRAST (5MM)  Result Date: 12/13/2020 CLINICAL DATA:  Altered mental status EXAM: CT HEAD WITHOUT CONTRAST TECHNIQUE: Contiguous axial images were obtained from the base of the skull through the vertex without intravenous contrast. COMPARISON:  Exam from the previous day. FINDINGS: Brain: No evidence of acute infarction, hemorrhage, hydrocephalus, extra-axial collection or mass lesion/mass effect. Chronic atrophic and ischemic changes are noted. Vascular: No hyperdense vessel or unexpected calcification. Skull: Normal. Negative for fracture or focal lesion. Sinuses/Orbits: No acute finding. Other: None. IMPRESSION: Chronic atrophic and ischemic changes without acute abnormality. Electronically Signed   By: Inez Catalina M.D.   On: 12/13/2020 03:48   CT HEAD WO CONTRAST (5MM)  Result Date: 12/12/2020 CLINICAL DATA:  Head trauma. EXAM: CT HEAD WITHOUT CONTRAST TECHNIQUE: Contiguous axial images were obtained from the base of the skull through the vertex without intravenous contrast. COMPARISON:  Head and neck CTA 07/02/2020 FINDINGS: Brain: There is no evidence of an acute infarct, intracranial hemorrhage, mass, midline shift, or extra-axial fluid collection. Mild cerebral atrophy is unchanged. Hypodensities in the cerebral white matter bilaterally are unchanged and nonspecific but compatible with moderate chronic small vessel ischemic disease. Vascular: Calcified atherosclerosis at the skull base. No hyperdense vessel. Skull: No fracture or suspicious osseous lesion.  Sinuses/Orbits: Paranasal sinuses and mastoid air cells are clear. Bilateral cataract extraction. Other: None. IMPRESSION: 1. No evidence of acute intracranial abnormality. 2. Moderate chronic small vessel ischemic disease. Electronically Signed   By: Logan Bores M.D.   On: 12/12/2020 14:24   ECHOCARDIOGRAM COMPLETE  Result Date: 12/12/2020    ECHOCARDIOGRAM REPORT   Patient Name:   Kathy [facility] Date of Exam: 12/12/2020  Medical Rec #:  [id number]      Height:       69.0 in Accession #:    [id number]     Weight:       190.0 lb Date of Birth:  1927-04-16      BSA:          2.021 m Patient Age:    87 years       BP:           122/81 mmHg Patient Gender: F              HR:           76 bpm. Exam Location:  Inpatient Procedure: 2D Echo, Cardiac Doppler and Color Doppler Indications:    NSTEMI  History:        Patient has prior history of Echocardiogram examinations, most                 recent 07/13/2020. CHF, Previous Myocardial Infarction and CAD;                 Risk Factors:Hypertension and Dyslipidemia.  Sonographer:    Clayton Lefort RDCS (AE) Referring Phys: [id number] [place]  1. Left ventricular ejection fraction, by estimation, is 55 to 60%. The left ventricle has normal function. The left ventricle demonstrates regional wall motion abnormalities (see scoring diagram/findings for description). There is moderate asymmetric left ventricular hypertrophy of the septal segment. Left ventricular diastolic parameters are consistent with Grade I diastolic dysfunction (impaired relaxation).  2. Right ventricular systolic function is normal. The right ventricular size is normal. There is moderately elevated pulmonary artery systolic pressure. The estimated right ventricular systolic pressure is 67.3 mmHg.  3. Left atrial size was mildly dilated.  4. The mitral valve is abnormal. Mild mitral valve regurgitation. Moderate mitral annular calcification.  5. The aortic valve is tricuspid. Aortic valve  regurgitation is trivial. Mild aortic valve sclerosis is present, with no evidence of aortic valve stenosis. Aortic valve mean gradient measures 5.0 mmHg.  6. Aortic dilatation noted. There is mild dilatation of the aortic root, measuring 42 mm.  7. The inferior vena cava is normal in size with greater than 50% respiratory variability, suggesting right atrial pressure of 3 mmHg. Comparison(s): Prior images reviewed side by side. LVEF remains normal range, wall motion abnomalities are similar to previous images. FINDINGS  Left Ventricle: Left ventricular ejection fraction, by estimation, is 55 to 60%. The left ventricle has normal function. The left ventricle demonstrates regional wall motion abnormalities. The left ventricular internal cavity size was normal in size. There is moderate asymmetric left ventricular hypertrophy of the septal segment. Left ventricular diastolic parameters are consistent with Grade I diastolic dysfunction (impaired relaxation).  LV Wall Scoring: The basal inferior segment is akinetic. The posterior wall is hypokinetic. The entire anterior wall, antero-lateral wall, entire septum, entire apex, and mid and distal inferior wall are normal. Right Ventricle: The right ventricular size is normal. No increase in right ventricular wall thickness. Right ventricular systolic function is normal. There is moderately elevated pulmonary artery systolic pressure. The tricuspid regurgitant velocity is 3.37 m/s, and with an assumed right atrial pressure of 3 mmHg, the estimated right ventricular systolic pressure is 41.9 mmHg. Left Atrium: Left atrial size was mildly dilated. Right Atrium: Right atrial size was normal in size. Pericardium: There is no evidence of pericardial effusion. Mitral Valve: The mitral valve is abnormal. Moderate mitral annular calcification. Mild mitral valve regurgitation. MV peak  gradient, 5.5 mmHg. The mean mitral valve gradient is 2.0 mmHg. Tricuspid Valve: The tricuspid valve  is normal in structure. Tricuspid valve regurgitation is mild. Aortic Valve: The aortic valve is tricuspid. There is moderate aortic valve annular calcification. Aortic valve regurgitation is trivial. Mild aortic valve sclerosis is present, with no evidence of aortic valve stenosis. Aortic valve mean gradient measures 5.0 mmHg. Aortic valve peak gradient measures 8.0 mmHg. Aortic valve area, by VTI measures 2.10 cm. Pulmonic Valve: The pulmonic valve was grossly normal. Pulmonic valve regurgitation is trivial. Aorta: Aortic dilatation noted. There is mild dilatation of the aortic root, measuring 42 mm. Venous: The inferior vena cava is normal in size with greater than 50% respiratory variability, suggesting right atrial pressure of 3 mmHg. IAS/Shunts: No atrial level shunt detected by color flow Doppler.  LEFT VENTRICLE PLAX 2D LVIDd:         5.20 cm  Diastology LV PW:         1.30 cm  LV e' medial:    4.46 cm/s LV IVS:        1.60 cm  LV E/e' medial:  17.7 LVOT diam:     2.30 cm  LV e' lateral:   5.44 cm/s LV SV:         61       LV E/e' lateral: 14.5 LV SV Index:   30 LVOT Area:     4.15 cm  RIGHT VENTRICLE            IVC RV Basal diam:  3.10 cm    IVC diam: 1.10 cm RV S prime:     9.68 cm/s TAPSE (M-mode): 2.5 cm LEFT ATRIUM             Index       RIGHT ATRIUM           Index LA diam:        3.80 cm 1.88 cm/m  RA Area:     16.50 cm LA Vol (A2C):   72.9 ml 36.06 ml/m RA Volume:   38.60 ml  19.10 ml/m LA Vol (A4C):   69.8 ml 34.53 ml/m LA Biplane Vol: 71.2 ml 35.22 ml/m  AORTIC VALVE AV Area (Vmax):    1.96 cm AV Area (Vmean):   1.79 cm AV Area (VTI):     2.10 cm AV Vmax:           141.00 cm/s AV Vmean:          109.000 cm/s AV VTI:            0.291 m AV Peak Grad:      8.0 mmHg AV Mean Grad:      5.0 mmHg LVOT Vmax:         66.60 cm/s LVOT Vmean:        47.000 cm/s LVOT VTI:          0.147 m LVOT/AV VTI ratio: 0.51  AORTA Ao Root diam: 4.20 cm Ao Asc diam:  3.60 cm MITRAL VALVE                TRICUSPID  VALVE MV Area (PHT): 4.80 cm     TR Peak grad:   45.4 mmHg MV Area VTI:   2.02 cm     TR Vmax:        337.00 cm/s MV Peak grad:  5.5 mmHg MV Mean grad:  2.0 mmHg     SHUNTS MV Vmax:  1.17 m/s     Systemic VTI:  0.15 m MV Vmean:      69.4 cm/s    Systemic Diam: 2.30 cm MV Decel Time: 158 msec MV E velocity: 79.10 cm/s MV A velocity: 123.00 cm/s MV E/A ratio:  0.64 Rozann Lesches MD Electronically signed by Rozann Lesches MD Signature Date/Time: 12/12/2020/5:03:03 PM    Final     Cardiac Studies   Echo 12/13/20:  1. Left ventricular ejection fraction, by estimation, is 55 to 60%. The  left ventricle has normal function. The left ventricle demonstrates  regional wall motion abnormalities (see scoring diagram/findings for  description). There is moderate asymmetric  left ventricular hypertrophy of the septal segment. Left ventricular  diastolic parameters are consistent with Grade I diastolic dysfunction  (impaired relaxation).   2. Right ventricular systolic function is normal. The right ventricular  size is normal. There is moderately elevated pulmonary artery systolic  pressure. The estimated right ventricular systolic pressure is 70.3 mmHg.   3. Left atrial size was mildly dilated.   4. The mitral valve is abnormal. Mild mitral valve regurgitation.  Moderate mitral annular calcification.   5. The aortic valve is tricuspid. Aortic valve regurgitation is trivial.  Mild aortic valve sclerosis is present, with no evidence of aortic valve  stenosis. Aortic valve mean gradient measures 5.0 mmHg.   6. Aortic dilatation noted. There is mild dilatation of the aortic root,  measuring 42 mm.   7. The inferior vena cava is normal in size with greater than 50%  respiratory variability, suggesting right atrial pressure of 3 mmHg.   Patient Profile     87 y.o. female with CAD status post RCA PCI in 2017, ascending aortic aneurysm, hypertension, hyperlipidemia, and GERD admitted with  NSTEMI.  Assessment & Plan    #NSTEMI: #CAD status post RCA PCI: # Hyperlipidemia: Ms. Hegstrom presented with chest pain after mopping her kitchen and a fall.  High-sensitivity troponin was elevated to 169.  Echo revealed normal systolic function.  She was sent to the Cath Lab yesterday for left heart cath but was unable to have this due to confusion.  She was moving around on the table and note the procedure was unsafe to proceed.  We will attempt to manage medically.  She has been on heparin for 48 hours.  We will discontinue this and start her on clopidogrel planning for DAPT for 12 months.  We will also add Imdur.  Continue metoprolol.  Lipids are well-controlled.  Her LDL was 36.  Continue rosuvastatin.  She does also have a mild UTI.  Unclear how much this may have contributed to her confusion in the Cath Lab.  We will clarify when family is back at the bedside.  # Ascending aorta aneurysm:  4.2cm on echo.  Continue beta blocker.   # Hypertension:  BP elevated.  Add amlodipine 2.5 mg.      For questions or updates, please contact [place] Please consult [email or web address] for contact info under        Signed, Skeet Latch, MD  12/14/2020, 9:55 AM

## 2020-12-15 DIAGNOSIS — E039 Hypothyroidism, unspecified: Secondary | ICD-10-CM

## 2020-12-15 DIAGNOSIS — I10 Essential (primary) hypertension: Secondary | ICD-10-CM | POA: Diagnosis not present

## 2020-12-15 DIAGNOSIS — E7800 Pure hypercholesterolemia, unspecified: Secondary | ICD-10-CM | POA: Diagnosis not present

## 2020-12-15 DIAGNOSIS — K219 Gastro-esophageal reflux disease without esophagitis: Secondary | ICD-10-CM

## 2020-12-15 DIAGNOSIS — I214 Non-ST elevation (NSTEMI) myocardial infarction: Secondary | ICD-10-CM | POA: Diagnosis not present

## 2020-12-15 LAB — CBC
HCT: 35.3 % — ABNORMAL LOW (ref 36.0–46.0)
Hemoglobin: 11.2 g/dL — ABNORMAL LOW (ref 12.0–15.0)
MCH: 30.4 pg (ref 26.0–34.0)
MCHC: 31.7 g/dL (ref 30.0–36.0)
MCV: 95.7 fL (ref 80.0–100.0)
Platelets: 103 10*3/uL — ABNORMAL LOW (ref 150–400)
RBC: 3.69 MIL/uL — ABNORMAL LOW (ref 3.87–5.11)
RDW: 13.2 % (ref 11.5–15.5)
WBC: 4.7 10*3/uL (ref 4.0–10.5)
nRBC: 0 % (ref 0.0–0.2)

## 2020-12-15 LAB — RENAL FUNCTION PANEL
Albumin: 2.8 g/dL — ABNORMAL LOW (ref 3.5–5.0)
Anion gap: 7 (ref 5–15)
BUN: 23 mg/dL (ref 8–23)
CO2: 25 mmol/L (ref 22–32)
Calcium: 8.5 mg/dL — ABNORMAL LOW (ref 8.9–10.3)
Chloride: 103 mmol/L (ref 98–111)
Creatinine, Ser: 0.72 mg/dL (ref 0.44–1.00)
GFR, Estimated: >60 mL/min (ref >60)
Glucose, Bld: 101 mg/dL — ABNORMAL HIGH (ref 70–99)
Phosphorus: 2.4 mg/dL — ABNORMAL LOW (ref 2.5–4.6)
Potassium: 3.8 mmol/L (ref 3.5–5.1)
Sodium: 135 mmol/L (ref 135–145)

## 2020-12-15 LAB — MAGNESIUM: Magnesium: 2 mg/dL (ref 1.7–2.4)

## 2020-12-15 LAB — URINE CULTURE: Culture: >=100000 — AB

## 2020-12-15 LAB — RESP PANEL BY RT-PCR (FLU A&B, COVID) ARPGX2
Influenza A by PCR: NEGATIVE
Influenza B by PCR: NEGATIVE
SARS Coronavirus 2 by RT PCR: NEGATIVE

## 2020-12-15 LAB — HEPARIN LEVEL (UNFRACTIONATED): Heparin Unfractionated: <0.1 IU/mL — ABNORMAL LOW (ref 0.30–0.70)

## 2020-12-15 MED ORDER — CLOPIDOGREL BISULFATE 75 MG PO TABS: 75.0000 mg | ORAL_TABLET | Freq: Every day | ORAL | 2 refills | Status: DC

## 2020-12-15 MED ORDER — ISOSORBIDE MONONITRATE ER 30 MG PO TB24: 30.0000 mg | ORAL_TABLET | Freq: Every day | ORAL | 2 refills | Status: DC

## 2020-12-15 MED ORDER — AMLODIPINE BESYLATE 2.5 MG PO TABS
2.5000 mg | ORAL_TABLET | Freq: Every day | ORAL | 2 refills | Status: DC
Start: 2020-12-16 — End: 2021-05-13

## 2020-12-15 MED ORDER — DIAZEPAM 2 MG PO TABS: 2.0000 mg | ORAL_TABLET | Freq: Four times a day (QID) | ORAL | 0 refills | Status: AC | PRN

## 2020-12-15 MED ORDER — GABAPENTIN 100 MG PO CAPS: 200.0000 mg | ORAL_CAPSULE | Freq: Every day | ORAL | 2 refills | Status: DC

## 2020-12-15 MED ORDER — NITROGLYCERIN 0.4 MG SL SUBL: 0.4000 mg | SUBLINGUAL_TABLET | SUBLINGUAL | 12 refills | Status: DC | PRN

## 2020-12-15 MED ORDER — CEPHALEXIN 500 MG PO CAPS: 500.0000 mg | ORAL_CAPSULE | Freq: Three times a day (TID) | ORAL | 0 refills | Status: AC

## 2020-12-15 NOTE — TOC Progression Note (Addendum)
Transition of Care [facility]) - Progression Note    Patient Details  Name: Kathy Murphy MRN: [id number] Date of Birth: February 12, 1928  Transition [facility] Care [facility]) CM/SW Contact  Reece Agar, [place] Phone Number: 12/15/2020, 1:25 PM  Clinical Narrative:    CSW contacted Whitney at [facility] for bed availability. Whitney informed CSW that they do not have any bed availability at this time.   CSW spoke with pt in room, pt seemed pleasantly confused and adamant about going home. Pt states she does not want to go to a SNF she has been to [facility] in the past but since they don't have any rooms pt states that is another reason she does not want to go to a SNF. CSW contacted pt friend on emergency contact list [place] [phone]. Pt friend stated that he agrees with pt going home and he can pick the pt up today. Pt friend states that there is someone from pt church who could sit with and assist pt during the day. CSW informed Neoma Laming NCM about pt needs for [facility].     Expected Discharge Plan: [place] Barriers to Discharge: Continued Medical Work up  Expected Discharge Plan and Services Expected Discharge Plan: [place] In-house Referral: Clinical Social Work   Post Acute Care Choice: [place] Living arrangements for the past 2 months: Apartment Expected Discharge Date: 12/15/20                                     Social Determinants of Health (SDOH) Interventions    Readmission Risk Interventions No flowsheet data found.

## 2020-12-15 NOTE — Plan of Care (Signed)
Follow up with cardiology NL office arranged on 01/04/21, see AVS

## 2020-12-15 NOTE — Consult Note (Addendum)
   [facility] Inpatient Consult   12/15/2020  Kathy Murphy [birth year] [id number]  [place] Organization [ACO] Patient:  Medicare CMS DCE  Addendum: updates below due to change in disposition needs, see below  Patient screened for hospitalization with to assess for potential [place] Management service needs for post hospital transition due to minimal support in community as discussed in unit [facility] progression meeting..  Review of patient's medical record reveals patient is for possible skilled nursing facility level of care. Met with the patient at bedside, alert oriented.  She tells this Probation officer she has no food insecurities at home and cooks "when I feel like it" during this conversation her friend  [name]' calls her phone and she tells him that this writer is in the room inquiring about her home needs.  He request at the patient's approval that information be left at the bedside.  A brochure with [place] [employer] information and 24 hour magnet left with patient.  Patient then states to this writer that she is concerned that her primary care provider may not know she is in the hospital and that she would like to reschedule her follow up appointments with Dr. Reynaldo Minium and her primary cardiologist.  This information was shared with the inpatient TOC RNCM. Discussion in progression meeting was regarding potential skilled nursing facility level of care for post hospital needs.   Plan:  Can have [facility] RN PAC to follow for transitioning needs back to community if needed.    Continue to follow until patient's disposition is completed.  Update: 12/15/20 3:30 pm patient has decided to go home with [facility], will request referral to [place] Coordinator to follow up.  For questions contact:   Natividad Brood, RN BSN [facility] Liaison  [phone] business mobile phone Toll free office [phone]  Fax  number: [phone] [email or web address]

## 2020-12-15 NOTE — TOC Initial Note (Addendum)
Transition of Care [facility]) - Initial/Assessment Note    Patient Details  Name: Kathy Murphy MRN: [id number] Date of Birth: [birth year]  Transition of Care [facility]) CM/SW Contact:    Kathy Mayo, RN Phone Number: 12/15/2020, 2:40 PM  Clinical Narrative:                 NCM notified from [place] that patient wants to go home, and Kathy Murphy  her contact person wants her to come home with Home Health, she will need HHRN, HHPT, HHAIDE and Social Work.  NCM contacted Kathy Murphy , offered choice, he does not have a preference.  She has walker and remote w/chair at home.  Kathy Murphy is on his way to pickup patient via car. He states she will have a church member staying with her and if they can not stay with her he will stay with her if he has to. NCM made referral to [facility] with Kathy Murphy for Kathy Murphy, HHPT, HHAIDE and Social Work , he is able to take referral.  Soc will begin 24 to 48 hrs post dc.  Kathy Murphy [phone] will be picking her up to go  home.  Expected Discharge Plan: Skilled Nursing Facility Barriers to Discharge: Continued Medical Work up   Patient Goals and CMS Choice Patient states their goals for this hospitalization and ongoing recovery are:: Rehab CMS Medicare.gov Compare Post Acute Care list provided to:: Patient Choice offered to / list presented to : Patient  Expected Discharge Plan and Services Expected Discharge Plan: [place] In-house Referral: Clinical Social Work   Post Acute Care Choice: Kathy Murphy Living arrangements for the past 2 months: Apartment Expected Discharge Date: 12/15/20                                    Prior Living Arrangements/Services Living arrangements for the past 2 months: Apartment Lives with:: Self Patient language and need for interpreter reviewed:: Yes Do you feel safe going back to the place where you live?: Yes      Need for Family Participation in Patient Care: Yes (Comment) Care giver support system in  place?: Yes (comment)   Criminal Activity/Legal Involvement Pertinent to Current Situation/Hospitalization: No - Comment as needed  Activities of Daily Living Home Assistive Devices/Equipment: [place] Rile (specify type) ADL Screening (condition at time of admission) Patient's cognitive ability adequate to safely complete daily activities?: Yes Is the patient deaf or have difficulty hearing?: No Does the patient have difficulty seeing, even when wearing glasses/contacts?: No Does the patient have difficulty concentrating, remembering, or making decisions?: No Patient able to express need for assistance with ADLs?: Yes Does the patient have difficulty dressing or bathing?: No Independently performs ADLs?: Yes (appropriate for developmental age) Does the patient have difficulty walking or climbing stairs?: Yes Weakness of Legs: Both Weakness of Arms/Hands: None  Permission Sought/Granted Permission sought to share information with : Facility Sport and exercise psychologist, Other (comment) Permission granted to share information with : Yes, Verbal Permission Granted  Share Information with NAME: Kathy Murphy (Friend)   [phone]  Permission granted to share info w AGENCY: SNF  Permission granted to share info w Relationship: Kathy Murphy (Friend)   [phone]  Permission granted to share info w Contact Information: Kathy Murphy [facility])   [phone]  Emotional Assessment Appearance:: Appears stated age Attitude/Demeanor/Rapport: Unable to Assess Affect (typically observed): Unable to Assess Orientation: : Oriented to Self, Oriented to  Place, Oriented to  Time, Oriented to Situation Alcohol / Substance Use: Not Applicable Psych Involvement: No (comment)  Admission diagnosis:  Chest pain [R07.9] Chest pain with high risk for cardiac etiology [R07.9] Patient Active Problem List   Diagnosis Date Noted   Thrombocytopenia ([place]) 12/12/2020   Leukocytosis 12/12/2020   Frequent falls  12/12/2020   Chest pain 12/12/2020   Chest pain with high risk for cardiac etiology    Status post total knee replacement, right 05/18/2017   Dyslipidemia 04/05/2017   Obesity    Myocardial infarction ([place])    Incontinence of urine    Hypertension    Seroma of breast 01/08/2017   Malignant neoplasm of upper-outer quadrant of right breast in female, estrogen receptor positive ([place]) 08/31/2016   CHF (congestive heart failure), NYHA class I, chronic, combined ([place]) 04/25/2016   Essential hypertension 04/25/2016   CAD (coronary artery disease), native coronary artery 02/11/2016   Chronic total occlusion of coronary artery    Coronary artery disease    Unstable angina pectoris (HCC)    NSTEMI (non-ST elevated myocardial infarction) ([place]) 02/07/2016   Abnormal nuclear cardiac imaging test 02/07/2016   Nipple discharge in female 04/17/2011   Heart disease, hypertensive    Hypercholesterolemia    Arthritis    Unsteady gait    Myalgia    Pain in joints    Retinal detachment    Hypothyroidism    GERD (gastroesophageal reflux disease)    UTI (urinary tract infection)    Vomiting    Overweight (BMI 25.0-29.9)    GI bleed    PCP:  Kathy Bunting, MD Pharmacy:   [facility], [address] [place] [address] Phone: [phone] Fax: [phone]     Social Determinants of Health (SDOH) Interventions    Readmission Risk Interventions No flowsheet data found.

## 2020-12-15 NOTE — TOC Transition Note (Signed)
Transition of Care [facility]) - CM/SW Discharge Note   Patient Details  Name: Kathy Murphy MRN: [id number] Date of Birth: May 30, 1927  Transition of Care [facility]) CM/SW Contact:  [name], RN Phone Number: 12/15/2020, 4:11 PM   Clinical Narrative:    NCM notified from [place] that patient wants to go home, and [name]  her contact person wants her to come home with Home Health, she will need HHRN, HHPT, HHAIDE and Social Work.  NCM contacted [name] , offered choice, he does not have a preference.  She has walker and remote w/chair at home.  [name] is on his way to pickup patient via car. He states she will have a church member staying with her and if they can not stay with her he will stay with her if he has to. NCM made referral to [facility] with [name] for [facility], HHPT, HHAIDE and Social Work , he is able to take referral.  Soc will begin 24 to 48 hrs post dc.  [name] [phone] will be picking her up to go  home.   Final next level of care: [place] Barriers to Discharge: No Barriers Identified   Patient Goals and CMS Choice Patient states their goals for this hospitalization and ongoing recovery are:: return home with [facility] CMS Medicare.gov Compare Post Acute Care list provided to:: Patient Represenative (must comment) Choice offered to / list presented to :  (Friend)  Discharge Placement                       Discharge Plan and Services In-house Referral: Clinical Social Work   Post Acute Care Choice: [place]            DME Agency: NA       HH Arranged: Therapist, sports, PT, Nurse's Aide, Social Work [employer] Agency: [place] Date [facility] Agency Contacted: 12/15/20 Time [place]: 1611 Representative spoke with at [place]: [place] ([place]) Interventions     Readmission Risk Interventions No flowsheet data found.

## 2020-12-15 NOTE — Progress Notes (Signed)
Progress Note  Patient Name: Kathy Murphy Date of Encounter: 12/15/2020  [facility] Cardiologist: Kathy Klein, MD   Subjective   Feeling OK.  Denies chest pain.  Felt well with walking.   Inpatient Medications    Scheduled Meds:  amLODipine  2.5 mg Oral Daily   anastrozole  1 mg Oral Daily   aspirin EC  81 mg Oral Daily   cholecalciferol  5,000 Units Oral Daily   clopidogrel  75 mg Oral Daily   gabapentin  200 mg Oral QHS   isosorbide mononitrate  30 mg Oral Daily   levothyroxine  125 mcg Oral Q0600   metoprolol succinate  100 mg Oral Daily   pantoprazole  40 mg Oral Daily   polyvinyl alcohol  1 drop Both Eyes Daily   rosuvastatin  10 mg Oral Daily   sodium chloride flush  3 mL Intravenous Q12H   sodium chloride flush  3 mL Intravenous Q12H   Continuous Infusions:  sodium chloride Stopped (12/14/20 1006)   sodium chloride Stopped (12/14/20 1006)   sodium chloride Stopped (12/14/20 1006)   cefTRIAXone (ROCEPHIN)  IV 1 g (12/15/20 0617)   PRN Meds: sodium chloride, sodium chloride, acetaminophen, diazepam, morphine injection, nitroGLYCERIN, ondansetron (ZOFRAN) IV, sodium chloride flush, sodium chloride flush   Vital Signs    Vitals:   12/14/20 1958 12/14/20 2000 12/15/20 0433 12/15/20 0923  BP: (!) 134/42 (!) 132/42 (!) 142/83 121/62  Pulse: 65 65 70 72  Resp: 16 18 17 18   Temp: 98.5 F (36.9 C)  98 F (36.7 C)   TempSrc: Oral  Oral   SpO2: 94% 93% 100% 97%  Weight:   81.3 kg   Height:        Intake/Output Summary (Last 24 hours) at 12/15/2020 0943 Last data filed at 12/15/2020 0437 Gross per 24 hour  Intake 240 ml  Output 300 ml  Net -60 ml   Last 3 Weights 12/15/2020 12/14/2020 12/13/2020  Weight (lbs) 179 lb 3.7 oz 183 lb 13.8 oz 177 lb 0.5 oz  Weight (kg) 81.3 kg 83.4 kg 80.3 kg      Telemetry    Sinus rhythm.  PVCs.  - Personally Reviewed  ECG    Sinus rhythm.  Rate 77 bpm.  First-degree AV block.  LVH with secondary repolarization  abnormality incomplete left bundle branch block.- Personally Reviewed  Physical Exam   GEN: No acute distress.   Neck: No JVD Cardiac: RRR, no murmurs, rubs, or gallops.  Respiratory: Clear to auscultation bilaterally. GI: Soft, nontender, non-distended  MS: No edema; No deformity. Neuro:  Nonfocal  Psych: Normal affect.  Initially confused. Reported just returning home about 1 hour ago.  Easily reoriented.   Labs    High Sensitivity Troponin:   Recent Labs  Lab 12/12/20 0113 12/12/20 0329 12/12/20 0514 12/12/20 0925 12/12/20 1144  TROPONINIHS 64* 64* 95* 168* 169*     Chemistry Recent Labs  Lab 12/11/20 2313 12/12/20 1144 12/13/20 0536 12/13/20 0943 12/14/20 0307 12/15/20 0346  NA 139   < > 138  --  140 135  K 3.5   < > 4.2  --  3.2* 3.8  CL 105   < > 105  --  104 103  CO2 25   < > 24  --  28 25  GLUCOSE 185*   < > 137*  --  109* 101*  BUN 28*   < > 21  --  25* 23  CREATININE 0.77   < >  0.84  --  0.82 0.72  CALCIUM 8.9   < > 8.9  --  8.8* 8.5*  MG  --    < >  --  1.9 2.1 2.0  PROT 6.7  --   --   --   --   --   ALBUMIN 4.1  --   --   --  3.1* 2.8*  AST 15  --   --   --   --   --   ALT 11  --   --   --   --   --   ALKPHOS 83  --   --   --   --   --   BILITOT 0.9  --   --   --   --   --   GFRNONAA >60   < > >60  --  >60 >60  ANIONGAP 9   < > 9  --  8 7   < > = values in this interval not displayed.    Lipids  Recent Labs  Lab 12/13/20 0536  CHOL 82  TRIG 31  HDL 40*  LDLCALC 36  CHOLHDL 2.1    Hematology Recent Labs  Lab 12/13/20 0536 12/14/20 0307 12/15/20 0346  WBC 12.8* 6.9 4.7  RBC 3.82* 3.79* 3.69*  HGB 12.0 11.7* 11.2*  HCT 36.4 36.4 35.3*  MCV 95.3 96.0 95.7  MCH 31.4 30.9 30.4  MCHC 33.0 32.1 31.7  RDW 13.2 13.2 13.2  PLT 104* 99* 103*   Thyroid  Recent Labs  Lab 12/12/20 1435  TSH 0.432    BNPNo results for input(s): BNP, PROBNP in the last 168 hours.  DDimer No results for input(s): DDIMER in the last 168 hours.    Radiology    No results found.  Cardiac Studies   Echo 12/13/20:  1. Left ventricular ejection fraction, by estimation, is 55 to 60%. The  left ventricle has normal function. The left ventricle demonstrates  regional wall motion abnormalities (see scoring diagram/findings for  description). There is moderate asymmetric  left ventricular hypertrophy of the septal segment. Left ventricular  diastolic parameters are consistent with Grade I diastolic dysfunction  (impaired relaxation).   2. Right ventricular systolic function is normal. The right ventricular  size is normal. There is moderately elevated pulmonary artery systolic  pressure. The estimated right ventricular systolic pressure is 24.4 mmHg.   3. Left atrial size was mildly dilated.   4. The mitral valve is abnormal. Mild mitral valve regurgitation.  Moderate mitral annular calcification.   5. The aortic valve is tricuspid. Aortic valve regurgitation is trivial.  Mild aortic valve sclerosis is present, with no evidence of aortic valve  stenosis. Aortic valve mean gradient measures 5.0 mmHg.   6. Aortic dilatation noted. There is mild dilatation of the aortic root,  measuring 42 mm.   7. The inferior vena cava is normal in size with greater than 50%  respiratory variability, suggesting right atrial pressure of 3 mmHg.   Patient Profile     87 y.o. female with CAD status post RCA PCI in 2017, ascending aortic aneurysm, hypertension, hyperlipidemia, and GERD admitted with NSTEMI.  Assessment & Plan    #NSTEMI: #CAD status post RCA PCI: # Hyperlipidemia: Ms. Treat presented with chest pain after mopping her kitchen and a fall.  High-sensitivity troponin was elevated to 169.  Echo revealed normal systolic function.  She was sent to the Cath Lab for left heart cath but was unable  to have this due to confusion.  She was moving around on the table and note the procedure was unsafe to proceed.  She has been intermittently  confused throughout hospitalization.  Plan for medical management. Clopidogrel was added with plan for 12 months DAPT.  Lipids are well-controlled on rosuvastatin.  Amlodipine and Imdur were added.  Continue beta blocker.    # Ascending aorta aneurysm:  4.2cm on echo.  Continue beta blocker.   # Hypertension:  BP better since adding amlodipine 2.5 mg.   [facility] will sign off.   Medication Recommendations:  continue current regimen.  Clopidogrel x12 months. Other recommendations (labs, testing, etc):  none Follow up as an outpatient:  we will arrange.      For questions or updates, please contact [place] Please consult [email or web address] for contact info under        Signed, Skeet Latch, MD  12/15/2020, 9:43 AM

## 2020-12-15 NOTE — Progress Notes (Signed)
Mobility Specialist Progress Note    12/15/20 1451  Mobility  Activity Ambulated in hall  Level of Assistance Contact guard assist, steadying assist  Assistive Device Front wheel walker  Distance Ambulated (ft) 440 ft  Mobility Ambulated with assistance in hallway  Mobility Response Tolerated fair  Mobility performed by Mobility specialist  Bed Position Chair  $Mobility charge 1 Mobility   Pt received in chair and agreeable to ambulation. Pt requires MinA d/t unsteady gait and running into walker. Some SOB towards end. Returned to chair with lap belt and call bell.   Hildred Alamin Mobility Specialist  Mobility Specialist Phone: [phone]

## 2020-12-15 NOTE — Evaluation (Signed)
Occupational Therapy Evaluation Patient Details Name: Kathy Murphy MRN: [id number] DOB: 13-Dec-1927 Today's Date: 12/15/2020   History of Present Illness 87 year old F presenting  with progressive typical chest pain for 1 week with associated dyspnea and nausea, and admitted 12/12/20 for non-STEMI.  Troponin trended from 13-169.  She was a started on IV heparin, BB, aspirin.  Cardiology consulted, s/p LHC 10/3 . UA concerning for UTI PMH of combined CHF, CAD s/p DES, breast cancer in 2018, HTN, HLD and urinary incontinence   Clinical Impression   Pt was independent prior to admission and ambulating with a cane. She drives and manages her finances. She has a caregiver who assists intermittently. Pt present with impaired standing balance and generalized weakness. She requires up to min assist for ADL and mobility with RW. Recommending ST rehab in SNF, but pt states she wants to go home and can ask a friend to stay with her. Will follow acutely.     Recommendations for follow up therapy are one component of a multi-disciplinary discharge planning process, led by the attending physician.  Recommendations may be updated based on patient status, additional functional criteria and insurance authorization.   Follow Up Recommendations  SNF;Supervision/Assistance - 24 hour (HHOT if pt refuses, states she has a friend who could stay with her)    Equipment Recommendations  None recommended by OT    Recommendations for Other Services       Precautions / Restrictions Precautions Precautions: Fall      Mobility Bed Mobility Overal bed mobility: Needs Assistance Bed Mobility: Sit to Supine       Sit to supine: Supervision   General bed mobility comments: for lines    Transfers Overall transfer level: Needs assistance Equipment used: Rolling walker (2 wheeled) Transfers: Sit to/from Stand Sit to Stand: Min guard              Balance Overall balance assessment: Needs  assistance Sitting-balance support: Feet supported;Bilateral upper extremity supported;No upper extremity supported Sitting balance-Leahy Scale: Good Sitting balance - Comments: no LOB donning socks   Standing balance support: Bilateral upper extremity supported Standing balance-Leahy Scale: Poor Standing balance comment: can release walker in static standing, dynamic balance reliant on RW and min assist                           ADL either performed or assessed with clinical judgement   ADL Overall ADL's : Needs assistance/impaired Eating/Feeding: Independent;Sitting   Grooming: Wash/dry hands;Standing;Min guard   Upper Body Bathing: Set up;Sitting   Lower Body Bathing: Sit to/from stand;Minimal assistance   Upper Body Dressing : Set up;Sitting   Lower Body Dressing: Sit to/from stand;Minimal assistance   Toilet Transfer: Minimal assistance;Ambulation;RW   Toileting- Clothing Manipulation and Hygiene: Minimal assistance;Sit to/from stand       Functional mobility during ADLs: Minimal assistance;Rolling walker       Vision Baseline Vision/History: 1 Wears glasses Patient Visual Report: No change from baseline       Perception     Praxis      Pertinent Vitals/Pain Pain Assessment: No/denies pain     Hand Dominance Right   Extremity/Trunk Assessment Upper Extremity Assessment Upper Extremity Assessment: Overall WFL for tasks assessed   Lower Extremity Assessment Lower Extremity Assessment: Defer to PT evaluation       Communication Communication Communication: HOH   Cognition Arousal/Alertness: Awake/alert Behavior During Therapy: WFL for tasks assessed/performed Overall Cognitive Status: Within  Functional Limits for tasks assessed                                 General Comments: for activities assessed, pt is [facility]   General Comments       Exercises     Shoulder Instructions      Home Living Family/patient expects to  be discharged to:: Private residence Living Arrangements: Alone Available Help at Discharge: Friend(s);Available PRN/intermittently (caregiver) Type of Home: House Home Access: Stairs to enter Entrance Stairs-Number of Steps: 3 Entrance Stairs-Rails: Left Home Layout: One level     Bathroom Shower/Tub: Occupational psychologist: Standard     Home Equipment: Cane - single point;Walker - 2 wheels;Shower seat - built in;Grab bars - tub/shower;Hand held shower head          Prior Functioning/Environment Level of Independence: Independent with assistive device(s)        Comments: reports she ambulates with RW, manages her medications, heats microwave meals, and drives to store and doctors appointments        OT Problem List: Impaired balance (sitting and/or standing);Decreased knowledge of use of DME or AE      OT Treatment/Interventions: Self-care/ADL training;DME and/or AE instruction;Patient/family education;Balance training;Therapeutic activities    OT Goals(Current goals can be found in the care plan section) Acute Rehab OT Goals Patient Stated Goal: go home OT Goal Formulation: With patient Time For Goal Achievement: 12/29/20 Potential to Achieve Goals: Good  OT Frequency: Min 2X/week   Barriers to D/C: Decreased caregiver support          Co-evaluation              AM-PAC OT "6 Clicks" Daily Activity     Outcome Measure Help from another person eating meals?: None Help from another person taking care of personal grooming?: A Little Help from another person toileting, which includes using toliet, bedpan, or urinal?: A Little Help from another person bathing (including washing, rinsing, drying)?: A Little Help from another person to put on and taking off regular upper body clothing?: None Help from another person to put on and taking off regular lower body clothing?: A Little 6 Click Score: 20   End of Session Equipment Utilized During Treatment:  Rolling walker  Activity Tolerance: Patient tolerated treatment well Patient left: in bed;with call bell/phone within reach;with bed alarm set  OT Visit Diagnosis: Unsteadiness on feet (R26.81);Other abnormalities of gait and mobility (R26.89)                Time: 0973-5329 OT Time Calculation (min): 36 min Charges:  OT General Charges $OT Visit: 1 Visit OT Treatments $Self Care/Home Management : 8-22 mins  Nestor Lewandowsky, OTR/L Acute Rehabilitation Services Pager: [phone] Office: [phone]  Malka So 12/15/2020, 10:38 AM

## 2020-12-15 NOTE — Progress Notes (Signed)
PROGRESS NOTE    Kathy Murphy  NUU:[id number] DOB: January 03, 1928 DOA: 12/11/2020 PCP: Burnard Bunting, MD    Chief Complaint  Patient presents with   Chest Pain    Brief Narrative:  87 year old F with PMH of combined CHF, CAD s/p DES, breast cancer in 2018, HTN, HLD and urinary incontinence presenting with progressive typical chest pain for 1 week with associated dyspnea and nausea, and admitted for non-STEMI.  Troponin trended from 13-169.  She was a started on IV heparin, BB, aspirin.  Cardiology consulted, and planning LHC.  TTE with LVEF of 55 to 60%, R WMA,  G1-DD and RVSP of 48.4 mmHg.   Patient had an episode of encephalopathy with confusion and combativeness overnight.  UA concerning for UTI.  Urine culture obtained and she was started on ceftriaxone.   She was restless on cath lab bed and did not undergo LHC.  Now the plan is medical management.    Assessment & Plan:   Principal Problem:   NSTEMI (non-ST elevated myocardial infarction) ([place]) Active Problems:   Hypercholesterolemia   Hypothyroidism   GERD (gastroesophageal reflux disease)   Coronary artery disease   CHF (congestive heart failure), NYHA class I, chronic, combined (HCC)   Hypertension   Thrombocytopenia (HCC)   Leukocytosis   Frequent falls   Chest pain with high risk for cardiac etiology   Chest pain  Chest pain? NSTEMI in the setting of CAD with DES Stent.  - started on DAPT for 12 months.  On low dose imdur.    E coli UTI:  Transition to oral anti biotics on discharge.    Frequent falls/ ambulatory dysfunction:  Therapy eval recommending SNF.    Chronic combined CHF with recovered EF Monitor.  She appears euvolemic.    Acute metabolic encephalopathy:  Appears to have resolved.  She is back to baseline.    Hypertension Blood pressure parameters are optimal.    Hypothyroidism TSH within normal limits Continue with Synthroid.   Hypokalemia Replaced.  Bilateral lower  extremity pain/restlessness Resolved.    DVT prophylaxis: SCD's Code Status: Full code  Family Communication: none at bedside.  Disposition:   Status is: Inpatient  Remains inpatient appropriate because:Unsafe d/c plan  Dispo: The patient is from: Home              Anticipated d/c is to: SNF              Patient currently is medically stable to d/c.   Difficult to place patient No       Consultants:  Cardiology.   Procedures: none.   Antimicrobials: keflex   Subjective: No new complaints.   Objective: Vitals:   12/14/20 1958 12/14/20 2000 12/15/20 0433 12/15/20 0923  BP: (!) 134/42 (!) 132/42 (!) 142/83 121/62  Pulse: 65 65 70 72  Resp: 16 18 17 18   Temp: 98.5 F (36.9 C)  98 F (36.7 C)   TempSrc: Oral  Oral   SpO2: 94% 93% 100% 97%  Weight:   81.3 kg   Height:        Intake/Output Summary (Last 24 hours) at 12/15/2020 1345 Last data filed at 12/15/2020 0437 Gross per 24 hour  Intake 240 ml  Output 300 ml  Net -60 ml   Filed Weights   12/13/20 0408 12/14/20 0526 12/15/20 0433  Weight: 80.3 kg 83.4 kg 81.3 kg    Examination:  General exam: Appears calm and comfortable  Respiratory system: Clear to auscultation. Respiratory effort  normal. Cardiovascular system: S1 & S2 heard, RRR. Marland Kitchen No pedal edema. Gastrointestinal system: Abdomen is nondistended, soft and nontender. Normal bowel sounds heard. Central nervous system: Alert and oriented. No focal neurological deficits. Extremities: Symmetric 5 x 5 power. Skin: No rashes, lesions or ulcers Psychiatry:  Mood & affect appropriate.     Data Reviewed: I have personally reviewed following labs and imaging studies  CBC: Recent Labs  Lab 12/11/20 2313 12/13/20 0042 12/13/20 0536 12/14/20 0307 12/15/20 0346  WBC 11.4* ACCURACY OF RESULTS QUESTIONABLE. RECOMMEND RECOLLECT TO VERIFY. 12.8* 6.9 4.7  HGB 13.0 ACCURACY OF RESULTS QUESTIONABLE. RECOMMEND RECOLLECT TO VERIFY. 12.0 11.7* 11.2*  HCT  40.1 ACCURACY OF RESULTS QUESTIONABLE. RECOMMEND RECOLLECT TO VERIFY. 36.4 36.4 35.3*  MCV 95.2 ACCURACY OF RESULTS QUESTIONABLE. RECOMMEND RECOLLECT TO VERIFY. 95.3 96.0 95.7  PLT 128* ACCURACY OF RESULTS QUESTIONABLE. RECOMMEND RECOLLECT TO VERIFY. 104* 99* 103*    Basic Metabolic Panel: Recent Labs  Lab 12/11/20 2313 12/12/20 1144 12/13/20 0536 12/13/20 0943 12/14/20 0307 12/15/20 0346  NA 139 140 138  --  140 135  K 3.5 3.6 4.2  --  3.2* 3.8  CL 105 108 105  --  104 103  CO2 25 20* 24  --  28 25  GLUCOSE 185* 143* 137*  --  109* 101*  BUN 28* 22 21  --  25* 23  CREATININE 0.77 0.90 0.84  --  0.82 0.72  CALCIUM 8.9 8.9 8.9  --  8.8* 8.5*  MG  --  2.0  --  1.9 2.1 2.0  PHOS  --   --   --   --  2.4* 2.4*    GFR: Estimated Creatinine Clearance: 52.2 mL/min (by C-G formula based on SCr of 0.72 mg/dL).  Liver Function Tests: Recent Labs  Lab 12/11/20 2313 12/14/20 0307 12/15/20 0346  AST 15  --   --   ALT 11  --   --   ALKPHOS 83  --   --   BILITOT 0.9  --   --   PROT 6.7  --   --   ALBUMIN 4.1 3.1* 2.8*    CBG: Recent Labs  Lab 12/11/20 2334  GLUCAP 126*     Recent Results (from the past 240 hour(s))  Resp Panel by RT-PCR (Flu A&B, Covid) Nasopharyngeal Swab     Status: None   Collection Time: 12/12/20 12:03 AM   Specimen: Nasopharyngeal Swab; Nasopharyngeal(NP) swabs in vial transport medium  Result Value Ref Range Status   SARS Coronavirus 2 by RT PCR NEGATIVE NEGATIVE Final    Comment: (NOTE) SARS-CoV-2 target nucleic acids are NOT DETECTED.  The SARS-CoV-2 RNA is generally detectable in upper respiratory specimens during the acute phase of infection. The lowest concentration of SARS-CoV-2 viral copies this assay can detect is 138 copies/mL. A negative result does not preclude SARS-Cov-2 infection and should not be used as the sole basis for treatment or other patient management decisions. A negative result may occur with  improper specimen  collection/handling, submission of specimen other than nasopharyngeal swab, presence of viral mutation(s) within the areas targeted by this assay, and inadequate number of viral copies(<138 copies/mL). A negative result must be combined with clinical observations, patient history, and epidemiological information. The expected result is Negative.  Fact Sheet for Patients:  EntrepreneurPulse.com.au  Fact Sheet for Healthcare Providers:  IncredibleEmployment.be  This test is no t yet approved or cleared by the [place] FDA and  has been authorized for detection  and/or diagnosis of SARS-CoV-2 by FDA under an Emergency Use Authorization (EUA). This EUA will remain  in effect (meaning this test can be used) for the duration of the COVID-19 declaration under Section 564(b)(1) of the Act, 21 U.S.C.section 360bbb-3(b)(1), unless the authorization is terminated  or revoked sooner.       Influenza A by PCR NEGATIVE NEGATIVE Final   Influenza B by PCR NEGATIVE NEGATIVE Final    Comment: (NOTE) The Xpert Xpress SARS-CoV-2/FLU/RSV plus assay is intended as an aid in the diagnosis of influenza from Nasopharyngeal swab specimens and should not be used as a sole basis for treatment. Nasal washings and aspirates are unacceptable for Xpert Xpress SARS-CoV-2/FLU/RSV testing.  Fact Sheet for Patients: EntrepreneurPulse.com.au  Fact Sheet for Healthcare Providers: IncredibleEmployment.be  This test is not yet approved or cleared by the [place] FDA and has been authorized for detection and/or diagnosis of SARS-CoV-2 by FDA under an Emergency Use Authorization (EUA). This EUA will remain in effect (meaning this test can be used) for the duration of the COVID-19 declaration under Section 564(b)(1) of the Act, 21 U.S.C. section 360bbb-3(b)(1), unless the authorization is terminated or revoked.  Performed at [facility], [address] [place], [place] [address]   Urine Culture     Status: Abnormal   Collection Time: 12/12/20 11:21 PM   Specimen: Urine, Clean Catch  Result Value Ref Range Status   Specimen Description URINE, CLEAN CATCH  Final   Special Requests   Final    NONE Performed at [facility], [place] [address], [place], [place] [address]    Culture >=100,000 COLONIES/mL ESCHERICHIA COLI (A)  Final   Report Status 12/15/2020 FINAL  Final   Organism ID, Bacteria ESCHERICHIA COLI (A)  Final      Susceptibility   Escherichia coli - MIC*    AMPICILLIN >=32 RESISTANT Resistant     CEFAZOLIN <=4 SENSITIVE Sensitive     CEFEPIME <=0.12 SENSITIVE Sensitive     CEFTRIAXONE <=0.25 SENSITIVE Sensitive     CIPROFLOXACIN >=4 RESISTANT Resistant     GENTAMICIN <=1 SENSITIVE Sensitive     IMIPENEM <=0.25 SENSITIVE Sensitive     NITROFURANTOIN <=16 SENSITIVE Sensitive     TRIMETH/SULFA >=320 RESISTANT Resistant     AMPICILLIN/SULBACTAM 16 INTERMEDIATE Intermediate     PIP/TAZO <=4 SENSITIVE Sensitive     * >=100,000 COLONIES/mL ESCHERICHIA COLI         Radiology Studies: No results found.      Scheduled Meds:  amLODipine  2.5 mg Oral Daily   anastrozole  1 mg Oral Daily   aspirin EC  81 mg Oral Daily   cholecalciferol  5,000 Units Oral Daily   clopidogrel  75 mg Oral Daily   gabapentin  200 mg Oral QHS   isosorbide mononitrate  30 mg Oral Daily   levothyroxine  125 mcg Oral Q0600   metoprolol succinate  100 mg Oral Daily   pantoprazole  40 mg Oral Daily   polyvinyl alcohol  1 drop Both Eyes Daily   rosuvastatin  10 mg Oral Daily   sodium chloride flush  3 mL Intravenous Q12H   sodium chloride flush  3 mL Intravenous Q12H   Continuous Infusions:  sodium chloride Stopped (12/14/20 1006)   sodium chloride Stopped (12/14/20 1006)   sodium chloride Stopped (12/14/20 1006)   cefTRIAXone (ROCEPHIN)  IV 1 g (12/15/20 0617)     LOS: 3 days  Hosie Poisson, MD [facility]   To contact the attending provider between 7A-7P or the covering provider during after hours 7P-7A, please log into the web site [email or web address] and access using universal [place] password for that web site. If you do not have the password, please call the hospital operator.  12/15/2020, 1:45 PM

## 2020-12-18 NOTE — Discharge Summary (Signed)
Physician Discharge Summary  Kathy Murphy GYJ:[id number] DOB: 1927/05/01 DOA: 12/11/2020  PCP: Burnard Bunting, MD  Admit date: 12/11/2020 Discharge date: 12/15/2020  Admitted From: Home Disposition:  Home  Recommendations for Outpatient Follow-up:  Follow up with PCP in 1-2 weeks Please obtain BMP/CBC in one week Please follow up with cardiology  office arranged on 01/04/21,  Discharge Condition: stable. CODE STATUS:Full code Diet recommendation: Heart Healthy    Brief/Interim Summary: 87 year old F with PMH of combined CHF, CAD s/p DES, breast cancer in 2018, HTN, HLD and urinary incontinence presenting with progressive typical chest pain for 1 week with associated dyspnea and nausea, and admitted for non-STEMI.  Troponin trended from 13-169.  She was a started on IV heparin, BB, aspirin.  Cardiology consulted, and planning LHC.  TTE with LVEF of 55 to 60%, R WMA,  G1-DD and RVSP of 48.4 mmHg.   Patient had an episode of encephalopathy with confusion and combativeness overnight.  UA concerning for UTI.  Urine culture obtained and she was started on ceftriaxone.   She was restless on cath lab bed and did not undergo LHC.  Now the plan is medical management  Discharge Diagnoses:  Principal Problem:   NSTEMI (non-ST elevated myocardial infarction) ([place]) Active Problems:   Hypercholesterolemia   Hypothyroidism   GERD (gastroesophageal reflux disease)   Coronary artery disease   CHF (congestive heart failure), NYHA class I, chronic, combined (HCC)   Hypertension   Thrombocytopenia (HCC)   Leukocytosis   Frequent falls   Chest pain with high risk for cardiac etiology   Chest pain  Chest pain? NSTEMI in the setting of CAD with DES Stent.  - started on DAPT for 12 months.  On low dose imdur.      E coli UTI:  Transition to oral anti biotics on discharge.      Frequent falls/ ambulatory dysfunction:  Therapy eval recommending SNF.      Chronic combined CHF with  recovered EF Monitor.  She appears euvolemic.      Acute metabolic encephalopathy:  Appears to have resolved.  She is back to baseline.      Hypertension Blood pressure parameters are optimal.       Hypothyroidism TSH within normal limits Continue with Synthroid.     Hypokalemia Replaced.   Bilateral lower extremity pain/restlessness Resolved.      Discharge Instructions  Discharge Instructions     AMB Referral to [facility] Coordinaton   Complete by: As directed    [employer] Team:  Primary Care Provider:  Burnard Bunting, MD, [facility] is listed to provide the [facility] follow up.  Patient has no family but friends and church members since husband widowed, please contact "Shorty" [name] is her friend who sees after her.  Please assign to Elizabeth Coordinator for complex care and disease management follow up calls and assess for further needs.  Questions please call:   Kathy Brood, RN BSN [facility] Liaison  [phone] business mobile phone Toll free office [phone]  Fax number: [phone] [email or web address]   Reason for Referral: [facility] Disease [facility] ([employer] payers)   Disease managment services needed: Nurse Case Manager   Diagnoses of: Other   Other Diagnosis: NSTEMI; AMS with UTI   Expected date of contact: Emergent - 3 Days   Diet - low sodium heart healthy   Complete by: As directed    Discharge instructions   Complete by: As directed  Please follow up with the PCP in 1 to 2  weeks.      Allergies as of 12/15/2020       Reactions   Other Other (See Comments)   SOME OF THE "MYCINS"...UNKNOWN REACTION.   Adhesive [tape] Rash   Latex Rash        Medication List     STOP taking these medications    losartan 100 MG tablet Commonly known as: COZAAR   meclizine 12.5 MG tablet Commonly known as: ANTIVERT       TAKE these  medications    acetaminophen 650 MG CR tablet Commonly known as: TYLENOL Take 650 mg by mouth every 8 (eight) hours as needed for pain.   amLODipine 2.5 MG tablet Commonly known as: NORVASC Take 1 tablet (2.5 mg total) by mouth daily.   anastrozole 1 MG tablet Commonly known as: ARIMIDEX TAKE 1 TABLET DAILY   aspirin EC 81 MG tablet Take 81 mg by mouth daily.   cephALEXin 500 MG capsule Commonly known as: KEFLEX Take 1 capsule (500 mg total) by mouth 3 (three) times daily for 4 days.   clopidogrel 75 MG tablet Commonly known as: PLAVIX Take 1 tablet (75 mg total) by mouth daily.   diazepam 2 MG tablet Commonly known as: VALIUM Take 1 tablet (2 mg total) by mouth every 6 (six) hours as needed for anxiety.   Diclofenac Sodium CR 100 MG 24 hr tablet Take 100 mg by mouth daily.   gabapentin 100 MG capsule Commonly known as: NEURONTIN Take 2 capsules (200 mg total) by mouth at bedtime.   isosorbide mononitrate 30 MG 24 hr tablet Commonly known as: IMDUR Take 1 tablet (30 mg total) by mouth daily.   levothyroxine 125 MCG tablet Commonly known as: SYNTHROID Take 125 mcg by mouth daily before breakfast.   metoprolol succinate 100 MG 24 hr tablet Commonly known as: TOPROL-XL TAKE 1 TABLET DAILY   nitroGLYCERIN 0.4 MG SL tablet Commonly known as: NITROSTAT Place 1 tablet (0.4 mg total) under the tongue every 5 (five) minutes x 3 doses as needed for chest pain.   pantoprazole 40 MG tablet Commonly known as: PROTONIX Take 40 mg by mouth daily.   REFRESH TEARS OP Apply 1-2 drops to eye daily.   rosuvastatin 10 MG tablet Commonly known as: CRESTOR Take 10 mg by mouth every morning.   Vitamin D3 125 MCG (5000 UT) Caps Take 1 capsule by mouth daily.        Follow-up Information     Deberah Pelton, NP Follow up on 01/04/2021.   Specialty: Cardiology Why: at 8:45 AM for your cardiology follow up appointment Contact information: [place] [address] [address], [facility] Follow up.   Specialty: Home Health Services Why: HHRN,HHPT, HHAIDE, Social Work, they will call you for schedule apt times Contact information: [address] [place] [address] [phone]                Allergies  Allergen Reactions   Other Other (See Comments)    SOME OF THE "MYCINS"...UNKNOWN REACTION.   Adhesive [Tape] Rash   Latex Rash    Consultations: cardiology   Procedures/Studies: CT HEAD WO CONTRAST (5MM)  Result Date: 12/13/2020 CLINICAL DATA:  Altered mental status EXAM: CT HEAD WITHOUT CONTRAST TECHNIQUE: Contiguous axial images were obtained from the base of the skull through the vertex without  intravenous contrast. COMPARISON:  Exam from the previous day. FINDINGS: Brain: No evidence of acute infarction, hemorrhage, hydrocephalus, extra-axial collection or mass lesion/mass effect. Chronic atrophic and ischemic changes are noted. Vascular: No hyperdense vessel or unexpected calcification. Skull: Normal. Negative for fracture or focal lesion. Sinuses/Orbits: No acute finding. Other: None. IMPRESSION: Chronic atrophic and ischemic changes without acute abnormality. Electronically Signed   By: Inez Catalina M.D.   On: 12/13/2020 03:48   CT HEAD WO CONTRAST (5MM)  Result Date: 12/12/2020 CLINICAL DATA:  Head trauma. EXAM: CT HEAD WITHOUT CONTRAST TECHNIQUE: Contiguous axial images were obtained from the base of the skull through the vertex without intravenous contrast. COMPARISON:  Head and neck CTA 07/02/2020 FINDINGS: Brain: There is no evidence of an acute infarct, intracranial hemorrhage, mass, midline shift, or extra-axial fluid collection. Mild cerebral atrophy is unchanged. Hypodensities in the cerebral white matter bilaterally are unchanged and nonspecific but compatible with moderate chronic small vessel ischemic disease. Vascular: Calcified atherosclerosis at the skull  base. No hyperdense vessel. Skull: No fracture or suspicious osseous lesion. Sinuses/Orbits: Paranasal sinuses and mastoid air cells are clear. Bilateral cataract extraction. Other: None. IMPRESSION: 1. No evidence of acute intracranial abnormality. 2. Moderate chronic small vessel ischemic disease. Electronically Signed   By: Logan Bores M.D.   On: 12/12/2020 14:24   DG Chest Portable 1 View  Result Date: 12/11/2020 CLINICAL DATA:  Chest pain. EXAM: PORTABLE CHEST 1 VIEW COMPARISON:  CT chest 09/09/2010. FINDINGS: Surgical clips overlie the lower neck and right axilla. Aorta is tortuous. The heart is borderline enlarged. There is prominence of the right upper paratracheal region corresponding to ectatic vasculature on CT. There is no focal lung consolidation, pleural effusion or pneumothorax. There is minimal left basilar atelectasis. No acute fractures are seen. IMPRESSION: Left basilar atelectasis. Electronically Signed   By: Ronney Asters M.D.   On: 12/11/2020 23:49   ECHOCARDIOGRAM COMPLETE  Result Date: 12/12/2020    ECHOCARDIOGRAM REPORT   Patient Name:   Kathy Murphy [facility] Date of Exam: 12/12/2020 Medical Rec #:  [id number]      Height:       69.0 in Accession #:    [id number]     Weight:       190.0 lb Date of Birth:  10-25-1927      BSA:          2.021 m Patient Age:    87 years       BP:           122/81 mmHg Patient Gender: F              HR:           76 bpm. Exam Location:  Inpatient Procedure: 2D Echo, Cardiac Doppler and Color Doppler Indications:    NSTEMI  History:        Patient has prior history of Echocardiogram examinations, most                 recent 07/13/2020. CHF, Previous Myocardial Infarction and CAD;                 Risk Factors:Hypertension and Dyslipidemia.  Sonographer:    Clayton Lefort RDCS (AE) Referring Phys: [id number] [place]  1. Left ventricular ejection fraction, by estimation, is 55 to 60%. The left ventricle has normal function. The left ventricle demonstrates  regional wall motion abnormalities (see scoring diagram/findings for description). There is moderate asymmetric left ventricular hypertrophy of the septal  segment. Left ventricular diastolic parameters are consistent with Grade I diastolic dysfunction (impaired relaxation).  2. Right ventricular systolic function is normal. The right ventricular size is normal. There is moderately elevated pulmonary artery systolic pressure. The estimated right ventricular systolic pressure is 80.9 mmHg.  3. Left atrial size was mildly dilated.  4. The mitral valve is abnormal. Mild mitral valve regurgitation. Moderate mitral annular calcification.  5. The aortic valve is tricuspid. Aortic valve regurgitation is trivial. Mild aortic valve sclerosis is present, with no evidence of aortic valve stenosis. Aortic valve mean gradient measures 5.0 mmHg.  6. Aortic dilatation noted. There is mild dilatation of the aortic root, measuring 42 mm.  7. The inferior vena cava is normal in size with greater than 50% respiratory variability, suggesting right atrial pressure of 3 mmHg. Comparison(s): Prior images reviewed side by side. LVEF remains normal range, wall motion abnomalities are similar to previous images. FINDINGS  Left Ventricle: Left ventricular ejection fraction, by estimation, is 55 to 60%. The left ventricle has normal function. The left ventricle demonstrates regional wall motion abnormalities. The left ventricular internal cavity size was normal in size. There is moderate asymmetric left ventricular hypertrophy of the septal segment. Left ventricular diastolic parameters are consistent with Grade I diastolic dysfunction (impaired relaxation).  LV Wall Scoring: The basal inferior segment is akinetic. The posterior wall is hypokinetic. The entire anterior wall, antero-lateral wall, entire septum, entire apex, and mid and distal inferior wall are normal. Right Ventricle: The right ventricular size is normal. No increase in right  ventricular wall thickness. Right ventricular systolic function is normal. There is moderately elevated pulmonary artery systolic pressure. The tricuspid regurgitant velocity is 3.37 m/s, and with an assumed right atrial pressure of 3 mmHg, the estimated right ventricular systolic pressure is 98.3 mmHg. Left Atrium: Left atrial size was mildly dilated. Right Atrium: Right atrial size was normal in size. Pericardium: There is no evidence of pericardial effusion. Mitral Valve: The mitral valve is abnormal. Moderate mitral annular calcification. Mild mitral valve regurgitation. MV peak gradient, 5.5 mmHg. The mean mitral valve gradient is 2.0 mmHg. Tricuspid Valve: The tricuspid valve is normal in structure. Tricuspid valve regurgitation is mild. Aortic Valve: The aortic valve is tricuspid. There is moderate aortic valve annular calcification. Aortic valve regurgitation is trivial. Mild aortic valve sclerosis is present, with no evidence of aortic valve stenosis. Aortic valve mean gradient measures 5.0 mmHg. Aortic valve peak gradient measures 8.0 mmHg. Aortic valve area, by VTI measures 2.10 cm. Pulmonic Valve: The pulmonic valve was grossly normal. Pulmonic valve regurgitation is trivial. Aorta: Aortic dilatation noted. There is mild dilatation of the aortic root, measuring 42 mm. Venous: The inferior vena cava is normal in size with greater than 50% respiratory variability, suggesting right atrial pressure of 3 mmHg. IAS/Shunts: No atrial level shunt detected by color flow Doppler.  LEFT VENTRICLE PLAX 2D LVIDd:         5.20 cm  Diastology LV PW:         1.30 cm  LV e' medial:    4.46 cm/s LV IVS:        1.60 cm  LV E/e' medial:  17.7 LVOT diam:     2.30 cm  LV e' lateral:   5.44 cm/s LV SV:         61       LV E/e' lateral: 14.5 LV SV Index:   30 LVOT Area:     4.15 cm  RIGHT VENTRICLE  IVC RV Basal diam:  3.10 cm    IVC diam: 1.10 cm RV S prime:     9.68 cm/s TAPSE (M-mode): 2.5 cm LEFT ATRIUM              Index       RIGHT ATRIUM           Index LA diam:        3.80 cm 1.88 cm/m  RA Area:     16.50 cm LA Vol (A2C):   72.9 ml 36.06 ml/m RA Volume:   38.60 ml  19.10 ml/m LA Vol (A4C):   69.8 ml 34.53 ml/m LA Biplane Vol: 71.2 ml 35.22 ml/m  AORTIC VALVE AV Area (Vmax):    1.96 cm AV Area (Vmean):   1.79 cm AV Area (VTI):     2.10 cm AV Vmax:           141.00 cm/s AV Vmean:          109.000 cm/s AV VTI:            0.291 m AV Peak Grad:      8.0 mmHg AV Mean Grad:      5.0 mmHg LVOT Vmax:         66.60 cm/s LVOT Vmean:        47.000 cm/s LVOT VTI:          0.147 m LVOT/AV VTI ratio: 0.51  AORTA Ao Root diam: 4.20 cm Ao Asc diam:  3.60 cm MITRAL VALVE                TRICUSPID VALVE MV Area (PHT): 4.80 cm     TR Peak grad:   45.4 mmHg MV Area VTI:   2.02 cm     TR Vmax:        337.00 cm/s MV Peak grad:  5.5 mmHg MV Mean grad:  2.0 mmHg     SHUNTS MV Vmax:       1.17 m/s     Systemic VTI:  0.15 m MV Vmean:      69.4 cm/s    Systemic Diam: 2.30 cm MV Decel Time: 158 msec MV E velocity: 79.10 cm/s MV A velocity: 123.00 cm/s MV E/A ratio:  0.64 Rozann Lesches MD Electronically signed by Rozann Lesches MD Signature Date/Time: 12/12/2020/5:03:03 PM    Final       Subjective: No new complaints  Discharge Exam: Vitals:   12/15/20 1200 12/15/20 1400  BP: (!) 116/53   Pulse: 67   Resp: 18 (!) 21  Temp: 98 F (36.7 C)   SpO2: 94%    Vitals:   12/15/20 0433 12/15/20 0923 12/15/20 1200 12/15/20 1400  BP: (!) 142/83 121/62 (!) 116/53   Pulse: 70 72 67   Resp: 17 18 18  (!) 21  Temp: 98 F (36.7 C)  98 F (36.7 C)   TempSrc: Oral     SpO2: 100% 97% 94%   Weight: 81.3 kg     Height:        General: Pt is alert, awake, not in acute distress Cardiovascular: RRR, S1/S2 +, no rubs, no gallops Respiratory: CTA bilaterally, no wheezing, no rhonchi Abdominal: Soft, NT, ND, bowel sounds + Extremities: no edema, no cyanosis    The results of significant diagnostics from this hospitalization  (including imaging, microbiology, ancillary and laboratory) are listed below for reference.     Microbiology: Recent Results (from the past 240 hour(s))  Resp Panel by  RT-PCR (Flu A&B, Covid) Nasopharyngeal Swab     Status: None   Collection Time: 12/12/20 12:03 AM   Specimen: Nasopharyngeal Swab; Nasopharyngeal(NP) swabs in vial transport medium  Result Value Ref Range Status   SARS Coronavirus 2 by RT PCR NEGATIVE NEGATIVE Final    Comment: (NOTE) SARS-CoV-2 target nucleic acids are NOT DETECTED.  The SARS-CoV-2 RNA is generally detectable in upper respiratory specimens during the acute phase of infection. The lowest concentration of SARS-CoV-2 viral copies this assay can detect is 138 copies/mL. A negative result does not preclude SARS-Cov-2 infection and should not be used as the sole basis for treatment or other patient management decisions. A negative result may occur with  improper specimen collection/handling, submission of specimen other than nasopharyngeal swab, presence of viral mutation(s) within the areas targeted by this assay, and inadequate number of viral copies(<138 copies/mL). A negative result must be combined with clinical observations, patient history, and epidemiological information. The expected result is Negative.  Fact Sheet for Patients:  EntrepreneurPulse.com.au  Fact Sheet for Healthcare Providers:  IncredibleEmployment.be  This test is no t yet approved or cleared by the [place] FDA and  has been authorized for detection and/or diagnosis of SARS-CoV-2 by FDA under an Emergency Use Authorization (EUA). This EUA will remain  in effect (meaning this test can be used) for the duration of the COVID-19 declaration under Section 564(b)(1) of the Act, 21 U.S.C.section 360bbb-3(b)(1), unless the authorization is terminated  or revoked sooner.       Influenza A by PCR NEGATIVE NEGATIVE Final   Influenza B by PCR  NEGATIVE NEGATIVE Final    Comment: (NOTE) The Xpert Xpress SARS-CoV-2/FLU/RSV plus assay is intended as an aid in the diagnosis of influenza from Nasopharyngeal swab specimens and should not be used as a sole basis for treatment. Nasal washings and aspirates are unacceptable for Xpert Xpress SARS-CoV-2/FLU/RSV testing.  Fact Sheet for Patients: EntrepreneurPulse.com.au  Fact Sheet for Healthcare Providers: IncredibleEmployment.be  This test is not yet approved or cleared by the [place] FDA and has been authorized for detection and/or diagnosis of SARS-CoV-2 by FDA under an Emergency Use Authorization (EUA). This EUA will remain in effect (meaning this test can be used) for the duration of the COVID-19 declaration under Section 564(b)(1) of the Act, 21 U.S.C. section 360bbb-3(b)(1), unless the authorization is terminated or revoked.  Performed at [facility], [address] [place], [place] [address]   Urine Culture     Status: Abnormal   Collection Time: 12/12/20 11:21 PM   Specimen: Urine, Clean Catch  Result Value Ref Range Status   Specimen Description URINE, CLEAN CATCH  Final   Special Requests   Final    NONE Performed at [facility], [place] [address] [place], [place] [address]    Culture >=100,000 COLONIES/mL ESCHERICHIA COLI (A)  Final   Report Status 12/15/2020 FINAL  Final   Organism ID, Bacteria ESCHERICHIA COLI (A)  Final      Susceptibility   Escherichia coli - MIC*    AMPICILLIN >=32 RESISTANT Resistant     CEFAZOLIN <=4 SENSITIVE Sensitive     CEFEPIME <=0.12 SENSITIVE Sensitive     CEFTRIAXONE <=0.25 SENSITIVE Sensitive     CIPROFLOXACIN >=4 RESISTANT Resistant     GENTAMICIN <=1 SENSITIVE Sensitive     IMIPENEM <=0.25 SENSITIVE Sensitive     NITROFURANTOIN <=16 SENSITIVE Sensitive     TRIMETH/SULFA >=320 RESISTANT Resistant     AMPICILLIN/SULBACTAM 16 INTERMEDIATE  Intermediate      PIP/TAZO <=4 SENSITIVE Sensitive     * >=100,000 COLONIES/mL ESCHERICHIA COLI  Resp Panel by RT-PCR (Flu A&B, Covid) Nasopharyngeal Swab     Status: None   Collection Time: 12/15/20  1:41 PM   Specimen: Nasopharyngeal Swab; Nasopharyngeal(NP) swabs in vial transport medium  Result Value Ref Range Status   SARS Coronavirus 2 by RT PCR NEGATIVE NEGATIVE Final    Comment: (NOTE) SARS-CoV-2 target nucleic acids are NOT DETECTED.  The SARS-CoV-2 RNA is generally detectable in upper respiratory specimens during the acute phase of infection. The lowest concentration of SARS-CoV-2 viral copies this assay can detect is 138 copies/mL. A negative result does not preclude SARS-Cov-2 infection and should not be used as the sole basis for treatment or other patient management decisions. A negative result may occur with  improper specimen collection/handling, submission of specimen other than nasopharyngeal swab, presence of viral mutation(s) within the areas targeted by this assay, and inadequate number of viral copies(<138 copies/mL). A negative result must be combined with clinical observations, patient history, and epidemiological information. The expected result is Negative.  Fact Sheet for Patients:  EntrepreneurPulse.com.au  Fact Sheet for Healthcare Providers:  IncredibleEmployment.be  This test is no t yet approved or cleared by the [place] FDA and  has been authorized for detection and/or diagnosis of SARS-CoV-2 by FDA under an Emergency Use Authorization (EUA). This EUA will remain  in effect (meaning this test can be used) for the duration of the COVID-19 declaration under Section 564(b)(1) of the Act, 21 U.S.C.section 360bbb-3(b)(1), unless the authorization is terminated  or revoked sooner.       Influenza A by PCR NEGATIVE NEGATIVE Final   Influenza B by PCR NEGATIVE NEGATIVE Final    Comment: (NOTE) The Xpert Xpress  SARS-CoV-2/FLU/RSV plus assay is intended as an aid in the diagnosis of influenza from Nasopharyngeal swab specimens and should not be used as a sole basis for treatment. Nasal washings and aspirates are unacceptable for Xpert Xpress SARS-CoV-2/FLU/RSV testing.  Fact Sheet for Patients: EntrepreneurPulse.com.au  Fact Sheet for Healthcare Providers: IncredibleEmployment.be  This test is not yet approved or cleared by the [place] FDA and has been authorized for detection and/or diagnosis of SARS-CoV-2 by FDA under an Emergency Use Authorization (EUA). This EUA will remain in effect (meaning this test can be used) for the duration of the COVID-19 declaration under Section 564(b)(1) of the Act, 21 U.S.C. section 360bbb-3(b)(1), unless the authorization is terminated or revoked.  Performed at [facility], [place] [address] [place], [place] [address]      Labs: BNP (last 3 results) No results for input(s): BNP in the last 8760 hours. Basic Metabolic Panel: Recent Labs  Lab 12/11/20 2313 12/12/20 1144 12/13/20 0536 12/13/20 0943 12/14/20 0307 12/15/20 0346  NA 139 140 138  --  140 135  K 3.5 3.6 4.2  --  3.2* 3.8  CL 105 108 105  --  104 103  CO2 25 20* 24  --  28 25  GLUCOSE 185* 143* 137*  --  109* 101*  BUN 28* 22 21  --  25* 23  CREATININE 0.77 0.90 0.84  --  0.82 0.72  CALCIUM 8.9 8.9 8.9  --  8.8* 8.5*  MG  --  2.0  --  1.9 2.1 2.0  PHOS  --   --   --   --  2.4* 2.4*   Liver Function Tests: Recent Labs  Lab  12/11/20 2313 12/14/20 0307 12/15/20 0346  AST 15  --   --   ALT 11  --   --   ALKPHOS 83  --   --   BILITOT 0.9  --   --   PROT 6.7  --   --   ALBUMIN 4.1 3.1* 2.8*   Recent Labs  Lab 12/11/20 2313  LIPASE 30   No results for input(s): AMMONIA in the last 168 hours. CBC: Recent Labs  Lab 12/11/20 2313 12/13/20 0042 12/13/20 0536 12/14/20 0307 12/15/20 0346  WBC 11.4* ACCURACY OF RESULTS  QUESTIONABLE. RECOMMEND RECOLLECT TO VERIFY. 12.8* 6.9 4.7  HGB 13.0 ACCURACY OF RESULTS QUESTIONABLE. RECOMMEND RECOLLECT TO VERIFY. 12.0 11.7* 11.2*  HCT 40.1 ACCURACY OF RESULTS QUESTIONABLE. RECOMMEND RECOLLECT TO VERIFY. 36.4 36.4 35.3*  MCV 95.2 ACCURACY OF RESULTS QUESTIONABLE. RECOMMEND RECOLLECT TO VERIFY. 95.3 96.0 95.7  PLT 128* ACCURACY OF RESULTS QUESTIONABLE. RECOMMEND RECOLLECT TO VERIFY. 104* 99* 103*   Cardiac Enzymes: No results for input(s): CKTOTAL, CKMB, CKMBINDEX, TROPONINI in the last 168 hours. BNP: Invalid input(s): POCBNP CBG: Recent Labs  Lab 12/11/20 2334  GLUCAP 126*   D-Dimer No results for input(s): DDIMER in the last 72 hours. Hgb A1c No results for input(s): HGBA1C in the last 72 hours. Lipid Profile No results for input(s): CHOL, HDL, LDLCALC, TRIG, CHOLHDL, LDLDIRECT in the last 72 hours. Thyroid function studies No results for input(s): TSH, T4TOTAL, T3FREE, THYROIDAB in the last 72 hours.  Invalid input(s): FREET3 Anemia work up No results for input(s): VITAMINB12, FOLATE, FERRITIN, TIBC, IRON, RETICCTPCT in the last 72 hours. Urinalysis    Component Value Date/Time   COLORURINE AMBER (A) 12/12/2020 2337   APPEARANCEUR CLOUDY (A) 12/12/2020 2337   LABSPEC 1.018 12/12/2020 2337   PHURINE 5.0 12/12/2020 2337   GLUCOSEU 50 (A) 12/12/2020 2337   HGBUR LARGE (A) 12/12/2020 2337   BILIRUBINUR NEGATIVE 12/12/2020 2337   [place] 12/12/2020 2337   PROTEINUR 100 (A) 12/12/2020 2337   UROBILINOGEN 0.2 12/26/2012 0843   NITRITE POSITIVE (A) 12/12/2020 2337   LEUKOCYTESUR LARGE (A) 12/12/2020 2337   Sepsis Labs Invalid input(s): PROCALCITONIN,  WBC,  LACTICIDVEN Microbiology Recent Results (from the past 240 hour(s))  Resp Panel by RT-PCR (Flu A&B, Covid) Nasopharyngeal Swab     Status: None   Collection Time: 12/12/20 12:03 AM   Specimen: Nasopharyngeal Swab; Nasopharyngeal(NP) swabs in vial transport medium  Result Value Ref  Range Status   SARS Coronavirus 2 by RT PCR NEGATIVE NEGATIVE Final    Comment: (NOTE) SARS-CoV-2 target nucleic acids are NOT DETECTED.  The SARS-CoV-2 RNA is generally detectable in upper respiratory specimens during the acute phase of infection. The lowest concentration of SARS-CoV-2 viral copies this assay can detect is 138 copies/mL. A negative result does not preclude SARS-Cov-2 infection and should not be used as the sole basis for treatment or other patient management decisions. A negative result may occur with  improper specimen collection/handling, submission of specimen other than nasopharyngeal swab, presence of viral mutation(s) within the areas targeted by this assay, and inadequate number of viral copies(<138 copies/mL). A negative result must be combined with clinical observations, patient history, and epidemiological information. The expected result is Negative.  Fact Sheet for Patients:  EntrepreneurPulse.com.au  Fact Sheet for Healthcare Providers:  IncredibleEmployment.be  This test is no t yet approved or cleared by the [place] FDA and  has been authorized for detection and/or diagnosis of SARS-CoV-2 by FDA under an Emergency  Use Authorization (EUA). This EUA will remain  in effect (meaning this test can be used) for the duration of the COVID-19 declaration under Section 564(b)(1) of the Act, 21 U.S.C.section 360bbb-3(b)(1), unless the authorization is terminated  or revoked sooner.       Influenza A by PCR NEGATIVE NEGATIVE Final   Influenza B by PCR NEGATIVE NEGATIVE Final    Comment: (NOTE) The Xpert Xpress SARS-CoV-2/FLU/RSV plus assay is intended as an aid in the diagnosis of influenza from Nasopharyngeal swab specimens and should not be used as a sole basis for treatment. Nasal washings and aspirates are unacceptable for Xpert Xpress SARS-CoV-2/FLU/RSV testing.  Fact Sheet for  Patients: EntrepreneurPulse.com.au  Fact Sheet for Healthcare Providers: IncredibleEmployment.be  This test is not yet approved or cleared by the [place] FDA and has been authorized for detection and/or diagnosis of SARS-CoV-2 by FDA under an Emergency Use Authorization (EUA). This EUA will remain in effect (meaning this test can be used) for the duration of the COVID-19 declaration under Section 564(b)(1) of the Act, 21 U.S.C. section 360bbb-3(b)(1), unless the authorization is terminated or revoked.  Performed at [facility], [address] [place], [place] [address]   Urine Culture     Status: Abnormal   Collection Time: 12/12/20 11:21 PM   Specimen: Urine, Clean Catch  Result Value Ref Range Status   Specimen Description URINE, CLEAN CATCH  Final   Special Requests   Final    NONE Performed at [facility], [place] [address], [place], [place] [address]    Culture >=100,000 COLONIES/mL ESCHERICHIA COLI (A)  Final   Report Status 12/15/2020 FINAL  Final   Organism ID, Bacteria ESCHERICHIA COLI (A)  Final      Susceptibility   Escherichia coli - MIC*    AMPICILLIN >=32 RESISTANT Resistant     CEFAZOLIN <=4 SENSITIVE Sensitive     CEFEPIME <=0.12 SENSITIVE Sensitive     CEFTRIAXONE <=0.25 SENSITIVE Sensitive     CIPROFLOXACIN >=4 RESISTANT Resistant     GENTAMICIN <=1 SENSITIVE Sensitive     IMIPENEM <=0.25 SENSITIVE Sensitive     NITROFURANTOIN <=16 SENSITIVE Sensitive     TRIMETH/SULFA >=320 RESISTANT Resistant     AMPICILLIN/SULBACTAM 16 INTERMEDIATE Intermediate     PIP/TAZO <=4 SENSITIVE Sensitive     * >=100,000 COLONIES/mL ESCHERICHIA COLI  Resp Panel by RT-PCR (Flu A&B, Covid) Nasopharyngeal Swab     Status: None   Collection Time: 12/15/20  1:41 PM   Specimen: Nasopharyngeal Swab; Nasopharyngeal(NP) swabs in vial transport medium  Result Value Ref Range Status   SARS Coronavirus 2 by RT PCR  NEGATIVE NEGATIVE Final    Comment: (NOTE) SARS-CoV-2 target nucleic acids are NOT DETECTED.  The SARS-CoV-2 RNA is generally detectable in upper respiratory specimens during the acute phase of infection. The lowest concentration of SARS-CoV-2 viral copies this assay can detect is 138 copies/mL. A negative result does not preclude SARS-Cov-2 infection and should not be used as the sole basis for treatment or other patient management decisions. A negative result may occur with  improper specimen collection/handling, submission of specimen other than nasopharyngeal swab, presence of viral mutation(s) within the areas targeted by this assay, and inadequate number of viral copies(<138 copies/mL). A negative result must be combined with clinical observations, patient history, and epidemiological information. The expected result is Negative.  Fact Sheet for Patients:  EntrepreneurPulse.com.au  Fact Sheet for Healthcare Providers:  IncredibleEmployment.be  This test is no t yet  approved or cleared by the [place] and  has been authorized for detection and/or diagnosis of SARS-CoV-2 by FDA under an Emergency Use Authorization (EUA). This EUA will remain  in effect (meaning this test can be used) for the duration of the COVID-19 declaration under Section 564(b)(1) of the Act, 21 U.S.C.section 360bbb-3(b)(1), unless the authorization is terminated  or revoked sooner.       Influenza A by PCR NEGATIVE NEGATIVE Final   Influenza B by PCR NEGATIVE NEGATIVE Final    Comment: (NOTE) The Xpert Xpress SARS-CoV-2/FLU/RSV plus assay is intended as an aid in the diagnosis of influenza from Nasopharyngeal swab specimens and should not be used as a sole basis for treatment. Nasal washings and aspirates are unacceptable for Xpert Xpress SARS-CoV-2/FLU/RSV testing.  Fact Sheet for Patients: EntrepreneurPulse.com.au  Fact Sheet for  Healthcare Providers: IncredibleEmployment.be  This test is not yet approved or cleared by the [place] FDA and has been authorized for detection and/or diagnosis of SARS-CoV-2 by FDA under an Emergency Use Authorization (EUA). This EUA will remain in effect (meaning this test can be used) for the duration of the COVID-19 declaration under Section 564(b)(1) of the Act, 21 U.S.C. section 360bbb-3(b)(1), unless the authorization is terminated or revoked.  Performed at [facility], [place] [address] [place], [place] [address]      Time coordinating discharge: 36 minutes  SIGNED:   Hosie Poisson, MD  [facility]

## 2020-12-20 DIAGNOSIS — K219 Gastro-esophageal reflux disease without esophagitis: Secondary | ICD-10-CM | POA: Diagnosis not present

## 2020-12-20 DIAGNOSIS — R269 Unspecified abnormalities of gait and mobility: Secondary | ICD-10-CM | POA: Diagnosis not present

## 2020-12-20 DIAGNOSIS — I214 Non-ST elevation (NSTEMI) myocardial infarction: Secondary | ICD-10-CM | POA: Diagnosis not present

## 2020-12-20 DIAGNOSIS — N390 Urinary tract infection, site not specified: Secondary | ICD-10-CM | POA: Diagnosis not present

## 2020-12-20 DIAGNOSIS — I110 Hypertensive heart disease with heart failure: Secondary | ICD-10-CM | POA: Diagnosis not present

## 2020-12-20 DIAGNOSIS — I5042 Chronic combined systolic (congestive) and diastolic (congestive) heart failure: Secondary | ICD-10-CM | POA: Diagnosis not present

## 2020-12-20 DIAGNOSIS — E039 Hypothyroidism, unspecified: Secondary | ICD-10-CM | POA: Diagnosis not present

## 2020-12-20 DIAGNOSIS — Z23 Encounter for immunization: Secondary | ICD-10-CM | POA: Diagnosis not present

## 2020-12-20 DIAGNOSIS — I2510 Atherosclerotic heart disease of native coronary artery without angina pectoris: Secondary | ICD-10-CM | POA: Diagnosis not present

## 2020-12-20 DIAGNOSIS — R82998 Other abnormal findings in urine: Secondary | ICD-10-CM | POA: Diagnosis not present

## 2020-12-22 ENCOUNTER — Other Ambulatory Visit: Payer: Self-pay | Admitting: *Deleted

## 2020-12-22 NOTE — Patient Outreach (Addendum)
[facility]) Care Management  12/22/2020  Kathy Murphy 1927-06-15 [id number]    [facility] Unsuccessful outreach to post hospital referred patient   Kathy Murphy was referred to [facility] on 12/16/20 after discharge from hospital for post hospital services   Insurance Medicare    Last admission on 12/12/20-12/15/20 for NSTEMI   Outreach attempt to the home number  No answer. [facility] RN CM left HIPAA [facility] Portability and Accountability Act) compliant voicemail message along with CM's contact info.   Plan: [facility] RN CM scheduled this patient for another call attempt within 4-7 business days Unsuccessful outreach letter sent on 12/22/20  Unsuccessful outreach on 12/22/20   Joelene Millin L. Lavina Hamman, RN, BSN, [place] Coordinator Office number [phone] Mobile number [phone]  Main [facility] number [phone] Fax number [phone]

## 2020-12-29 ENCOUNTER — Other Ambulatory Visit: Payer: Self-pay

## 2020-12-29 ENCOUNTER — Other Ambulatory Visit: Payer: Self-pay | Admitting: *Deleted

## 2020-12-29 ENCOUNTER — Encounter: Payer: Self-pay | Admitting: *Deleted

## 2020-12-29 NOTE — Patient Outreach (Signed)
[facility]) Care Management  12/29/2020  Kathy Murphy 1927/10/17 [id number]  [facility] outreach to post hospital referred patient    Kathy Murphy was referred to [facility] on 12/16/20 after discharge from hospital for post hospital services     Insurance Medicare / Lynda Rainwater     Last admission on 12/12/20-12/15/20 for NSTEMI   Transition of care services noted to be completed by primary care MD office staff- [facility] Dr Deirdre Pippins Transition of Care will be completed by primary care provider office who will refer to [facility] care management if needed.   Outreach to Kathy Murphy at [phone] Kathy Murphy is able to verify her HIPAA identifiers She confirms she is Hard of hearing [facility])   Assessment Kathy Uriostegui reports she is doing well  She reports she has made her follow up appointments She is receiving private pay services from 8 pm to 8 am  She reports she was not able to coordinate services for [facility] home care as listed on her discharge sheet  She was seen by her primary care provider (PCP) on 12/20/20 and her medications were reviewed   congestive Heart Failure (CHF) She reports minimal swelling with a good appetite and fluid intake She admits to not weighing daily but reports she will restart process RN CM review the importance of her CHF action plan to report weigh gains of 3-5 lbs to MD for medication management She voiced understanding Patient Active Problem List   Diagnosis Date Noted   Thrombocytopenia ([place]) 12/12/2020   Leukocytosis 12/12/2020   Frequent falls 12/12/2020   Chest pain 12/12/2020   Chest pain with high risk for cardiac etiology    Status post total knee replacement, right 05/18/2017   Dyslipidemia 04/05/2017   Obesity    Myocardial infarction [facility])    Incontinence of urine    Hypertension    Seroma of breast 01/08/2017   Malignant neoplasm of upper-outer quadrant of right breast in female, estrogen receptor positive  ([place]) 08/31/2016   CHF (congestive heart failure), NYHA class I, chronic, combined ([place]) 04/25/2016   Essential hypertension 04/25/2016   CAD (coronary artery disease), native coronary artery 02/11/2016   Chronic total occlusion of coronary artery    Coronary artery disease    Unstable angina pectoris (HCC)    NSTEMI (non-ST elevated myocardial infarction) ([place]) 02/07/2016   Abnormal nuclear cardiac imaging test 02/07/2016   Nipple discharge in female 04/17/2011   Heart disease, hypertensive    Hypercholesterolemia    Arthritis    Unsteady gait    Myalgia    Pain in joints    Retinal detachment    Hypothyroidism    GERD (gastroesophageal reflux disease)    UTI (urinary tract infection)    Vomiting    Overweight (BMI 25.0-29.9)    GI bleed    Past Medical History:  Diagnosis Date   Arthritis    Breast cancer ([place])    Right   CAD (coronary artery disease)    Cancer (HCC)    thyroid   Chronic combined systolic and diastolic CHF (congestive heart failure) (HCC)    Dilated aortic root (HCC)    First degree AV block    GERD (gastroesophageal reflux disease)    GI bleed 1980   AFTER POLYP EXCISION   Heart disease, hypertensive    Hypercholesterolemia    Hypertension    Hypothyroidism    Incontinence of urine    Myalgia  Myocardial infarction (HCC)    Obesity    Pain in joints    Retinal detachment    Unsteady gait    UTI (urinary tract infection)    Vision loss of left eye    Vomiting    Current Outpatient Medications on File Prior to Visit  Medication Sig Dispense Refill   acetaminophen (TYLENOL) 650 MG CR tablet Take 650 mg by mouth every 8 (eight) hours as needed for pain.     amLODipine (NORVASC) 2.5 MG tablet Take 1 tablet (2.5 mg total) by mouth daily. 30 tablet 2   anastrozole (ARIMIDEX) 1 MG tablet TAKE 1 TABLET DAILY (Patient taking differently: Take 1 mg by mouth daily.) 90 tablet 3   aspirin EC 81 MG tablet Take 81 mg by mouth daily.      Carboxymethylcellulose Sodium (REFRESH TEARS OP) Apply 1-2 drops to eye daily.     Cholecalciferol (VITAMIN D3) 125 MCG (5000 UT) CAPS Take 1 capsule by mouth daily.     clopidogrel (PLAVIX) 75 MG tablet Take 1 tablet (75 mg total) by mouth daily. 30 tablet 2   diazepam (VALIUM) 2 MG tablet Take 1 tablet (2 mg total) by mouth every 6 (six) hours as needed for anxiety. 2 tablet 0   Diclofenac Sodium CR 100 MG 24 hr tablet Take 100 mg by mouth daily.     gabapentin (NEURONTIN) 100 MG capsule Take 2 capsules (200 mg total) by mouth at bedtime. 30 capsule 2   isosorbide mononitrate (IMDUR) 30 MG 24 hr tablet Take 1 tablet (30 mg total) by mouth daily. 30 tablet 2   levothyroxine (SYNTHROID, LEVOTHROID) 125 MCG tablet Take 125 mcg by mouth daily before breakfast.     metoprolol succinate (TOPROL-XL) 100 MG 24 hr tablet TAKE 1 TABLET DAILY (Patient taking differently: Take 100 mg by mouth daily.) 90 tablet 3   nitroGLYCERIN (NITROSTAT) 0.4 MG SL tablet Place 1 tablet (0.4 mg total) under the tongue every 5 (five) minutes x 3 doses as needed for chest pain. 30 tablet 12   pantoprazole (PROTONIX) 40 MG tablet Take 40 mg by mouth daily.     rosuvastatin (CRESTOR) 10 MG tablet Take 10 mg by mouth every morning.     No current facility-administered medications on file prior to visit.     Plans Patient agrees to care plan and follow up within the next 30 business days  Goals Addressed               This Visit's Progress     Patient Stated     Improve My Heart Health-Coronary Artery Disease [facility]) (pt-stated)   Not on track     Timeframe:  Long-Range Goal Priority:  High Start Date:                            12/29/20 Expected End Date:       03/11/21                Follow Up Date 01/04/21    - learn about small changes that will make a big difference     Notes:  12/29/20 She reports minimal swelling with a good appetite and fluid intake. She admits to not weighing daily but reports she  will restart process      Track and Manage Fluids and Swelling-Heart Failure([facility]) (pt-stated)   Not on track     Timeframe:  Long-Range Goal Priority:  High Start Date:                          12/29/20   Expected End Date:          03/11/21             Follow Up Date 01/04/21    - weigh myself daily     Notes:  12/29/20 She reports minimal swelling with a good appetite and fluid intake She admits to not weighing daily but reports she will restart process          Britnay Magnussen L. Lavina Hamman, RN, BSN, [place] Coordinator Office number [phone] Main [facility] number [phone] Fax number [phone]

## 2021-01-02 NOTE — Progress Notes (Signed)
Cardiology Clinic Note   Patient Name: Kathy Murphy Date of Encounter: 01/04/2021  Primary Care Provider:  Burnard Bunting, MD Primary Cardiologist:  Sanda Klein, MD  Patient Profile    Kathy Murphy 87 year old female presents the clinic today for follow-up evaluation of her NSTEMI  Past Medical History    Past Medical History:  Diagnosis Date   Arthritis    Breast cancer ([place])    Right   CAD (coronary artery disease)    Cancer ([place])    thyroid   Chronic combined systolic and diastolic CHF (congestive heart failure) ([place])    Dilated aortic root ([facility])    First degree AV block    GERD (gastroesophageal reflux disease)    GI bleed 1980   AFTER POLYP EXCISION   Heart disease, hypertensive    Hypercholesterolemia    Hypertension    Hypothyroidism    Incontinence of urine    Myalgia    Myocardial infarction ([place])    Obesity    Pain in joints    Retinal detachment    Unsteady gait    UTI (urinary tract infection)    Vision loss of left eye    Vomiting    Past Surgical History:  Procedure Laterality Date   ABDOMINAL HYSTERECTOMY  1977   BREAST BIOPSY     BREAST LUMPECTOMY     x5   CARDIAC CATHETERIZATION N/A 02/08/2016   Procedure: Left Heart Cath and Coronary Angiography;  Surgeon: Belva Crome, MD;  Location: [facility];  Service: Cardiovascular;  Laterality: N/A;   CARDIAC CATHETERIZATION N/A 02/11/2016   Procedure: Coronary Stent Intervention;  Surgeon: Belva Crome, MD;  Location: [facility];  Service: Cardiovascular;  Laterality: N/A;   CATARACT EXTRACTION, BILATERAL     CERVICAL LAMINECTOMY     DENTAL SURGERY  1952   EVACUATION BREAST HEMATOMA Right 01/09/2017   Procedure: EVACUATION RIGHT MASTECTOMY FLUID COLLECTION;  Surgeon: Rolm Bookbinder, MD;  Location: [facility];  Service: General;  Laterality: Right;   [place]  2012   left   TOTAL KNEE ARTHROPLASTY  Right 05/18/2017   Procedure: RIGHT TOTAL KNEE ARTHROPLASTY;  Surgeon: Netta Cedars, MD;  Location: [facility];  Service: Orthopedics;  Laterality: Right;   TOTAL MASTECTOMY Right 11/27/2016   TOTAL MASTECTOMY Right 11/27/2016   Procedure: RIGHT TOTAL MASTECTOMY;  Surgeon: Rolm Bookbinder, MD;  Location: [facility];  Service: General;  Laterality: Right;    Allergies  Allergies  Allergen Reactions   Other Other (See Comments)    SOME OF THE "MYCINS"...UNKNOWN REACTION.   Adhesive [Tape] Rash   Latex Rash    History of Present Illness    She presented to the emergency department on 12/12/20 after developing chest discomfort while mopping her kitchen floor.  She fell at that time.  Her troponins on arrival to the emergency department were elevated at 169.  Her echocardiogram showed normal systolic function.  She was taken to the cardiac Cath Lab 12/13/2020 but was unable to tolerate cardiac catheterization due to confusion.  She reported pain in her leg and inability to lay flat.  It was felt that due to her movement on the cardiac cath table the procedure was unsafe.  Medical management was recommended. She received heparin infusion for 48 hours.  She was started on clopidogrel, Imdur, her metoprolol was continued.  Her  cholesterol was well controlled on her LDL was found to be 36.  Her rosuvastatin was continued.  She was noted to have mild UTI.  It was not clear how much her infection was contributing to her confusion.  She presents the clinic today for follow-up evaluation states has not taken her amlodipine or Imdur yet this morning.  She has been taking her other medications as scheduled.  She was embarrassed that she was confused at the hospital.  We discussed her current medication regimen and reasons for not proceeding with cardiac catheterization.  She and her son expressed understanding.  She has been back to her normal daily activities at home and was using her walker for ambulation.  We  discussed not driving and having her son assist her with getting which her chair for groceries.  I will give her the salty 6 diet sheet, have her continue to increase her physical activity as tolerated, continue her current medication regimen and plan follow-up for 3 to 4 months.  Today she denies chest pain, shortness of breath, lower extremity edema, fatigue, palpitations, melena, hematuria, hemoptysis, diaphoresis, weakness, presyncope, syncope, orthopnea, and PND.   Home Medications    Prior to Admission medications   Medication Sig Start Date End Date Taking? Authorizing Provider  acetaminophen (TYLENOL) 650 MG CR tablet Take 650 mg by mouth every 8 (eight) hours as needed for pain.    [provider]  amLODipine (NORVASC) 2.5 MG tablet Take 1 tablet (2.5 mg total) by mouth daily. 12/16/20   Hosie Poisson, MD  anastrozole (ARIMIDEX) 1 MG tablet TAKE 1 TABLET DAILY Patient taking differently: Take 1 mg by mouth daily. 03/22/20   Magrinat, Virgie Dad, MD  aspirin EC 81 MG tablet Take 81 mg by mouth daily.    [provider]  Carboxymethylcellulose Sodium (REFRESH TEARS OP) Apply 1-2 drops to eye daily.    [provider]  Cholecalciferol (VITAMIN D3) 125 MCG (5000 UT) CAPS Take 1 capsule by mouth daily.    [provider]  ciprofloxacin (CIPRO) 500 MG tablet Take 500 mg by mouth 2 (two) times daily. 12/27/20   [provider]  clopidogrel (PLAVIX) 75 MG tablet Take 1 tablet (75 mg total) by mouth daily. 12/16/20   Hosie Poisson, MD  diazepam (VALIUM) 2 MG tablet Take 1 tablet (2 mg total) by mouth every 6 (six) hours as needed for anxiety. 12/15/20   Hosie Poisson, MD  Diclofenac Sodium CR 100 MG 24 hr tablet Take 100 mg by mouth daily. 09/02/16   [provider]  gabapentin (NEURONTIN) 100 MG capsule Take 2 capsules (200 mg total) by mouth at bedtime. 12/15/20   Hosie Poisson, MD  isosorbide mononitrate (IMDUR) 30 MG 24 hr tablet Take 1 tablet  (30 mg total) by mouth daily. 12/16/20   Hosie Poisson, MD  levothyroxine (SYNTHROID, LEVOTHROID) 125 MCG tablet Take 125 mcg by mouth daily before breakfast.    [provider]  metoprolol succinate (TOPROL-XL) 100 MG 24 hr tablet TAKE 1 TABLET DAILY Patient taking differently: Take 100 mg by mouth daily. 04/13/20   Croitoru, Mihai, MD  nitroGLYCERIN (NITROSTAT) 0.4 MG SL tablet Place 1 tablet (0.4 mg total) under the tongue every 5 (five) minutes x 3 doses as needed for chest pain. 12/15/20   Hosie Poisson, MD  pantoprazole (PROTONIX) 40 MG tablet Take 40 mg by mouth daily.    [provider]  rosuvastatin (CRESTOR) 10 MG tablet Take 10 mg by mouth every  morning.    [provider]    Family History    Family History  Problem Relation Age of Onset   Heart disease Mother    She indicated that her mother is deceased. She indicated that her father is deceased.  Social History    Social History   Socioeconomic History   Marital status: Widowed    Spouse name: Not on file   Number of children: Not on file   Years of education: Not on file   Highest education level: Not on file  Occupational History   Not on file  Tobacco Use   Smoking status: Never   Smokeless tobacco: Never  Vaping Use   Vaping Use: Never used  Substance and Sexual Activity   Alcohol use: No   Drug use: No   Sexual activity: Not on file  Other Topics Concern   Not on file  Social History Narrative   Not on file   Social Determinants of Health   Financial Resource Strain: Not on file  Food Insecurity: No Food Insecurity   Worried About Running Out of Food in the Last Year: Never true   [place] in the Last Year: Never true  Transportation Needs: No Transportation Needs   Lack of Transportation (Medical): No   Lack of Transportation (Non-Medical): No  Physical Activity: Not on file  Stress: No Stress Concern Present   Feeling of Stress : Only a little  Social  Connections: Moderately Integrated   Frequency of Communication with Friends and Family: Three times a week   Frequency of Social Gatherings with Friends and Family: Three times a week   Attends Religious Services: 1 to 4 times per year   Active Member of Clubs or Organizations: Yes   Attends Archivist Meetings: 1 to 4 times per year   Marital Status: Widowed  Human resources officer Violence: Not At Risk   Fear of Current or Ex-Partner: No   Emotionally Abused: No   Physically Abused: No   Sexually Abused: No     Review of Systems    General:  No chills, fever, night sweats or weight changes.  Cardiovascular:  No chest pain, dyspnea on exertion, edema, orthopnea, palpitations, paroxysmal nocturnal dyspnea. Dermatological: No rash, lesions/masses Respiratory: No cough, dyspnea Urologic: No hematuria, dysuria Abdominal:   No nausea, vomiting, diarrhea, bright red blood per rectum, melena, or hematemesis Neurologic:  No visual changes, wkns, changes in mental status. All other systems reviewed and are otherwise negative except as noted above.  Physical Exam    VS:  BP 138/70   Pulse 67   Ht 5\' 9"  (1.753 m)   Wt 180 lb (81.6 kg)   SpO2 97%   BMI 26.58 kg/m  , BMI Body mass index is 26.58 kg/m. GEN: Well nourished, well developed, in no acute distress. HEENT: normal. Neck: Supple, no JVD, carotid bruits, or masses. Cardiac: RRR, no murmurs, rubs, or gallops. No clubbing, cyanosis, edema.  Radials/DP/PT 2+ and equal bilaterally.  Respiratory:  Respirations regular and unlabored, clear to auscultation bilaterally. GI: Soft, nontender, nondistended, BS + x 4. MS: no deformity or atrophy. Skin: warm and dry, no rash. Neuro:  Strength and sensation are intact. Psych: Normal affect.  Accessory Clinical Findings    Recent Labs: 12/11/2020: ALT 11 12/12/2020: TSH 0.432 12/15/2020: BUN 23; Creatinine, Ser 0.72; Hemoglobin 11.2; Magnesium 2.0; Platelets 103; Potassium 3.8;  Sodium 135   Recent Lipid Panel    Component Value  Date/Time   CHOL 82 12/13/2020 0536   CHOL 116 04/16/2017 1037   TRIG 31 12/13/2020 0536   HDL 40 (L) 12/13/2020 0536   HDL 45 04/16/2017 1037   CHOLHDL 2.1 12/13/2020 0536   VLDL 6 12/13/2020 0536   LDLCALC 36 12/13/2020 0536   LDLCALC 57 04/16/2017 1037    ECG personally reviewed by me today-would like to be hot sinus rhythm with first-degree AV block possible left atrial enlargement left axis deviation LVH septal infarct undetermined age 2 bpm- No acute changes  Echocardiogram 12/12/2020  IMPRESSIONS     1. Left ventricular ejection fraction, by estimation, is 55 to 60%. The  left ventricle has normal function. The left ventricle demonstrates  regional wall motion abnormalities (see scoring diagram/findings for  description). There is moderate asymmetric  left ventricular hypertrophy of the septal segment. Left ventricular  diastolic parameters are consistent with Grade I diastolic dysfunction  (impaired relaxation).   2. Right ventricular systolic function is normal. The right ventricular  size is normal. There is moderately elevated pulmonary artery systolic  pressure. The estimated right ventricular systolic pressure is 78.2 mmHg.   3. Left atrial size was mildly dilated.   4. The mitral valve is abnormal. Mild mitral valve regurgitation.  Moderate mitral annular calcification.   5. The aortic valve is tricuspid. Aortic valve regurgitation is trivial.  Mild aortic valve sclerosis is present, with no evidence of aortic valve  stenosis. Aortic valve mean gradient measures 5.0 mmHg.   6. Aortic dilatation noted. There is mild dilatation of the aortic root,  measuring 42 mm.   7. The inferior vena cava is normal in size with greater than 50%  respiratory variability, suggesting right atrial pressure of 3 mmHg.   Comparison(s): Prior images reviewed side by side. LVEF remains normal  range, wall motion abnomalities are  similar to previous images.  Cardiac catheterization 02/11/2016  Mid RCA chronic total occlusion collateralized from the left coronary artery. Successful overlapping stents from the distal RCA no the margin of the PDA back to the proximal RCA using two 38 mm long Synergy drug-eluting stents postdilated to 3.0 mm in diameter with 100% stenosis reduced to 0%. Grade 3 flow was noted. Angio-Seal was used for successful hemostasis.   RECOMMENDATIONS:   Discharge in a.m. on aspirin and Plavix. Ambulate later this evening  Diagnostic Dominance: Right Intervention   Assessment & Plan   1.  Coronary artery disease-no recent episodes of arm neck back or chest discomfort.  Denies any discomfort since being discharged from the hospital.  Unable to tolerate cardiac catheterization 12/13/2020.  Medical management was recommended Continue metoprolol, aspirin, rosuvastatin, Imdur Heart healthy low-sodium diet-salty 6 given Increase physical activity as tolerated  Hyperlipidemia-12/13/2020: Cholesterol 82; HDL 40; LDL Cholesterol 36; Triglycerides 31; VLDL 6 Continue aspirin, rosuvastatin Heart healthy low-sodium high-fiber diet.   Increase physical activity as tolerated  Essential hypertension-BP today 138/70.  Well-controlled at home. Continue metoprolol, amlodipine Heart healthy low-sodium diet-salty 6 given Increase physical activity as tolerated  Asending aortic aneurysm-denies recent episodes of arm neck back or chest discomfort.  Measured 4.2 cm on echocardiogram 12/12/20. Continue metoprolol  Disposition: Follow-up with Dr. Sallyanne Kuster or me in 3-4 months.  Jossie Ng. Georgetta Crafton NP-C    01/04/2021, 8:39 AM [place] [address] Office [phone] Fax [phone]  Notice: This dictation was prepared with Dragon dictation along with smaller phrase technology. Any transcriptional errors that result from this process are unintentional  and may not be  corrected upon review.  I spent 15 minutes examining this patient, reviewing medications, and using patient centered shared decision making involving her cardiac care.  Prior to her visit I spent greater than 20 minutes reviewing her past medical history,  medications, and prior cardiac tests.

## 2021-01-04 ENCOUNTER — Other Ambulatory Visit: Payer: Self-pay | Admitting: *Deleted

## 2021-01-04 ENCOUNTER — Encounter: Payer: Self-pay | Admitting: General Practice

## 2021-01-04 ENCOUNTER — Other Ambulatory Visit: Payer: Self-pay

## 2021-01-04 ENCOUNTER — Ambulatory Visit (INDEPENDENT_AMBULATORY_CARE_PROVIDER_SITE_OTHER): Payer: Medicare Other | Admitting: General Practice

## 2021-01-04 VITALS — BP 138/70 | HR 67 | Ht 69.0 in | Wt 180.0 lb

## 2021-01-04 DIAGNOSIS — E7800 Pure hypercholesterolemia, unspecified: Secondary | ICD-10-CM

## 2021-01-04 DIAGNOSIS — I10 Essential (primary) hypertension: Secondary | ICD-10-CM | POA: Diagnosis not present

## 2021-01-04 DIAGNOSIS — I2510 Atherosclerotic heart disease of native coronary artery without angina pectoris: Secondary | ICD-10-CM

## 2021-01-04 DIAGNOSIS — I7121 Aneurysm of the ascending aorta, without rupture: Secondary | ICD-10-CM | POA: Diagnosis not present

## 2021-01-04 NOTE — Patient Instructions (Addendum)
Medication Instructions:  The current medical regimen is effective;  continue present plan and medications as directed. Please refer to the Current Medication list given to you today.  *If you need a refill on your cardiac medications before your next appointment, please call your pharmacy*  Lab Work:   Testing/Procedures:  NONE    NONE  Special Instructions PLEASE READ AND FOLLOW SALTY 6-ATTACHED-1,800mg  daily  PLEASE INCREASE PHYSICAL ACTIVITY AS TOLERATED  TAKE AND LOG YOUR BLOOD PRESSURE 1 HOUR AFTER TAKING YOUR MEDICATION AND BRING WITH YOU TO YOUR FOLLOW UP APPOINTMENT.  PLEASE READ AND FOLLOW LOW SALT DIET-ATTACHED  Follow-Up: Your next appointment:  3 month(s) In Person with Sanda Klein, MD   At [facility], you and your health needs are our priority.  As part of our continuing mission to provide you with exceptional heart care, we have created designated Provider Care Teams.  These Care Teams include your primary Cardiologist (physician) and Advanced Practice Providers (APPs -  Physician Assistants and Nurse Practitioners) who all work together to provide you with the care you need, when you need it.            6 SALTY THINGS TO AVOID     1,800MG  DAILY     DASH Eating Plan DASH stands for Dietary Approaches to Stop Hypertension. The DASH eating plan is a healthy eating plan that has been shown to: Reduce high blood pressure (hypertension). Reduce your risk for type 2 diabetes, heart disease, and stroke. Help with weight loss. What are tips for following this plan? Reading food labels Check food labels for the amount of salt (sodium) per serving. Choose foods with less than 5 percent of the Daily Value of sodium. Generally, foods with less than 300 milligrams (mg) of sodium per serving fit into this eating plan. To find whole grains, look for the word "whole" as the first word in the ingredient list. Shopping Buy products labeled as "low-sodium" or "no salt  added." Buy fresh foods. Avoid canned foods and pre-made or frozen meals. Cooking Avoid adding salt when cooking. Use salt-free seasonings or herbs instead of table salt or sea salt. Check with your health care provider or pharmacist before using salt substitutes. Do not fry foods. Cook foods using healthy methods such as baking, boiling, grilling, roasting, and broiling instead. Cook with heart-healthy oils, such as olive, canola, avocado, soybean, or sunflower oil. Meal planning  Eat a balanced diet that includes: 4 or more servings of fruits and 4 or more servings of vegetables each day. Try to fill one-half of your plate with fruits and vegetables. 6-8 servings of whole grains each day. Less than 6 oz (170 g) of lean meat, poultry, or fish each day. A 3-oz (85-g) serving of meat is about the same size as a deck of cards. One egg equals 1 oz (28 g). 2-3 servings of low-fat dairy each day. One serving is 1 cup (237 mL). 1 serving of nuts, seeds, or beans 5 times each week. 2-3 servings of heart-healthy fats. Healthy fats called omega-3 fatty acids are found in foods such as walnuts, flaxseeds, fortified milks, and eggs. These fats are also found in cold-water fish, such as sardines, salmon, and mackerel. Limit how much you eat of: Canned or prepackaged foods. Food that is high in trans fat, such as some fried foods. Food that is high in saturated fat, such as fatty meat. Desserts and other sweets, sugary drinks, and other foods with added sugar. Full-fat dairy  products. Do not salt foods before eating. Do not eat more than 4 egg yolks a week. Try to eat at least 2 vegetarian meals a week. Eat more home-cooked food and less restaurant, buffet, and fast food. Lifestyle When eating at a restaurant, ask that your food be prepared with less salt or no salt, if possible. If you drink alcohol: Limit how much you use to: 0-1 drink a day for women who are not pregnant. 0-2 drinks a day for  men. Be aware of how much alcohol is in your drink. In the [place] one drink equals one 12 oz bottle of beer (355 mL), one 5 oz glass of wine (148 mL), or one 1 oz glass of hard liquor (44 mL). General information Avoid eating more than 2,300 mg of salt a day. If you have hypertension, you may need to reduce your sodium intake to 1,500 mg a day. Work with your health care provider to maintain a healthy body weight or to lose weight. Ask what an ideal weight is for you. Get at least 30 minutes of exercise that causes your heart to beat faster (aerobic exercise) most days of the week. Activities may include walking, swimming, or biking. Work with your health care provider or dietitian to adjust your eating plan to your individual calorie needs. What foods should I eat? Fruits All fresh, dried, or frozen fruit. Canned fruit in natural juice (without added sugar). Vegetables Fresh or frozen vegetables (raw, steamed, roasted, or grilled). Low-sodium or reduced-sodium tomato and vegetable juice. Low-sodium or reduced-sodium tomato sauce and tomato paste. Low-sodium or reduced-sodium canned vegetables. Grains Whole-grain or whole-wheat bread. Whole-grain or whole-wheat pasta. Brown rice. [place]. Bulgur. Whole-grain and low-sodium cereals. Pita bread. Low-fat, low-sodium crackers. Whole-wheat flour tortillas. Meats and other proteins Skinless chicken or [place]. Ground chicken or [place]. Pork with fat trimmed off. Fish and seafood. Egg whites. Dried beans, peas, or lentils. Unsalted nuts, nut butters, and seeds. Unsalted canned beans. Lean cuts of beef with fat trimmed off. Low-sodium, lean precooked or cured meat, such as sausages or meat loaves. Dairy Low-fat (1%) or fat-free (skim) milk. Reduced-fat, low-fat, or fat-free cheeses. Nonfat, low-sodium ricotta or cottage cheese. Low-fat or nonfat yogurt. Low-fat, low-sodium cheese. Fats and oils Soft margarine without trans fats. Vegetable oil.  Reduced-fat, low-fat, or light mayonnaise and salad dressings (reduced-sodium). Canola, safflower, olive, avocado, soybean, and sunflower oils. Avocado. Seasonings and condiments Herbs. Spices. Seasoning mixes without salt. Other foods Unsalted popcorn and pretzels. Fat-free sweets. The items listed above may not be a complete list of foods and beverages you can eat. Contact a dietitian for more information. What foods should I avoid? Fruits Canned fruit in a light or heavy syrup. Fried fruit. Fruit in cream or butter sauce. Vegetables Creamed or fried vegetables. Vegetables in a cheese sauce. Regular canned vegetables (not low-sodium or reduced-sodium). Regular canned tomato sauce and paste (not low-sodium or reduced-sodium). Regular tomato and vegetable juice (not low-sodium or reduced-sodium). Angie Fava. Olives. Grains Baked goods made with fat, such as croissants, muffins, or some breads. Dry pasta or rice meal packs. Meats and other proteins Fatty cuts of meat. Ribs. Fried meat. Berniece Salines. Bologna, salami, and other precooked or cured meats, such as sausages or meat loaves. Fat from the back of a pig (fatback). Bratwurst. Salted nuts and seeds. Canned beans with added salt. Canned or smoked fish. Whole eggs or egg yolks. Chicken or [place] with skin. Dairy Whole or 2% milk, cream, and half-and-half. Whole or full-fat  cream cheese. Whole-fat or sweetened yogurt. Full-fat cheese. Nondairy creamers. Whipped toppings. Processed cheese and cheese spreads. Fats and oils Butter. Stick margarine. Lard. Shortening. Ghee. Bacon fat. Tropical oils, such as coconut, palm kernel, or palm oil. Seasonings and condiments Onion salt, garlic salt, seasoned salt, table salt, and sea salt. Worcestershire sauce. Tartar sauce. Barbecue sauce. Teriyaki sauce. Soy sauce, including reduced-sodium. Steak sauce. Canned and packaged gravies. Fish sauce. Oyster sauce. Cocktail sauce. Store-bought horseradish. Ketchup. Mustard.  Meat flavorings and tenderizers. Bouillon cubes. Hot sauces. Pre-made or packaged marinades. Pre-made or packaged taco seasonings. Relishes. Regular salad dressings. Other foods Salted popcorn and pretzels. The items listed above may not be a complete list of foods and beverages you should avoid. Contact a dietitian for more information. Where to find more information National Heart, Lung, and Blood Institute: https://wilson-eaton.com/ American Heart Association: www.heart.org Academy of Nutrition and Dietetics: www.eatright.[place]: www.kidney.org Summary The DASH eating plan is a healthy eating plan that has been shown to reduce high blood pressure (hypertension). It may also reduce your risk for type 2 diabetes, heart disease, and stroke. When on the DASH eating plan, aim to eat more fresh fruits and vegetables, whole grains, lean proteins, low-fat dairy, and heart-healthy fats. With the DASH eating plan, you should limit salt (sodium) intake to 2,300 mg a day. If you have hypertension, you may need to reduce your sodium intake to 1,500 mg a day. Work with your health care provider or dietitian to adjust your eating plan to your individual calorie needs. This information is not intended to replace advice given to you by your health care provider. Make sure you discuss any questions you have with your health care provider. Document Revised: 01/31/2019 Document Reviewed: 01/31/2019 Elsevier Patient Education  2022 [employer].

## 2021-01-17 ENCOUNTER — Other Ambulatory Visit: Payer: Self-pay

## 2021-01-17 NOTE — Patient Outreach (Signed)
[facility]) Care Management  01/17/2021  Kathy Murphy 04-02-1927 [id number]   Late entry for 01/04/21 1614  THN outreach to post hospital referred patient    Kathy Murphy was referred to [facility] on 12/16/20 after discharge from hospital for post hospital services     Insurance Medicare / Lynda Rainwater     Last admission on 12/12/20-12/15/20 for NSTEMI   Transition of care services noted to be completed by primary care MD office staff- [facility] Dr Deirdre Pippins Transition of Care will be completed by primary care provider office who will refer to [facility] care management if needed.     Outreach to Kathy Murphy at [phone] Kathy Murphy is able to verify her HIPAA identifiers  Assessment Kathy Murphy reports she is doing well S She continues to use her walker to ambulate in her home  She continues to have support of her friends and private pay staff from 8 pm to 8 am   She denies worsening CHF symptoms today She denies weight gains > 3-5 lbs, coughs, etc  She makes inquires about perineal care and bidets RN CM answers questions and providers some answers regarding the process of obtaining, installing and use of bidets. She voiced interest of having her support system to follow up on this for her  She denies worsening perineal, vaginal or urinary symptoms  Plans Patient agrees to care plan and follow up within the next 30 business days   Kathy Murphy L. Lavina Hamman, RN, BSN, [place] Coordinator Office number [phone] Main [place] number [phone] Fax number [phone]

## 2021-01-18 ENCOUNTER — Other Ambulatory Visit: Payer: Self-pay | Admitting: *Deleted

## 2021-01-18 DIAGNOSIS — I214 Non-ST elevation (NSTEMI) myocardial infarction: Secondary | ICD-10-CM | POA: Insufficient documentation

## 2021-01-18 DIAGNOSIS — G4700 Insomnia, unspecified: Secondary | ICD-10-CM | POA: Insufficient documentation

## 2021-01-18 DIAGNOSIS — N3281 Overactive bladder: Secondary | ICD-10-CM | POA: Insufficient documentation

## 2021-01-18 DIAGNOSIS — G9341 Metabolic encephalopathy: Secondary | ICD-10-CM | POA: Insufficient documentation

## 2021-01-18 NOTE — Patient Outreach (Signed)
[facility]) Care Management  01/18/2021  Kathy Murphy 09/23/1927 [id number]   [facility] outreach to post hospital referred patient    Kathy Murphy was referred to [facility] on 12/16/20 after discharge from hospital for post hospital services     Insurance Medicare / Lynda Rainwater     Last admission on 12/12/20-12/15/20 for NSTEMI   Transition of care services noted to be completed by primary care MD office staff- [facility] Dr Deirdre Pippins Transition of Care will be completed by primary care provider office who will refer to [facility] care management if needed.     Outreach to Kathy Murphy at [phone] Kathy Murphy is able to verify her HIPAA identifiers  Care Plan : Coronary Artery Disease (Adult)  Updates made by Barbaraann Faster, RN since 01/18/2021 12:00 AM     Problem: Disease Progression (Coronary Artery Disease) Resolved 01/18/2021  Priority: High  Onset Date: 12/29/2020     Goal: Disease Progression Prevented or Minimized Completed 01/18/2021  Start Date: 12/29/2020  Expected End Date: 03/11/2021  This Visit's Progress: On track  Recent Progress: Not on track  Priority: High  Note:   Notes:     Task: Alleviate Barriers to Coronary Artery Disease Therapy Completed 01/18/2021  Due Date: 03/11/2021  Outcome: Positive  Responsible User: Barbaraann Faster, RN  Note:   Care Management Activities:  01/18/21 denies issues Resolving due to duplicate goal  68/34/19 - barriers to treatment adherence reviewed and addressed - communicable disease prevention promoted - complementary therapy use encouraged - difficulty of making life-long changes acknowledged - functional limitation screening reviewed - healthy lifestyle promoted - individualized medical nutrition therapy provided - medication-adherence assessment completed - medication side effects managed - response to pharmacologic therapy monitored - self-awareness of signs/symptoms of worsening  disease encouraged    Notes:     Care Plan : Heart Failure (Adult)  Updates made by Barbaraann Faster, RN since 01/18/2021 12:00 AM     Problem: Disease Progression (Heart Failure) Resolved 01/18/2021  Priority: High  Onset Date: 12/29/2020     Goal: Comorbidities Identified and Managed Completed 01/18/2021  Start Date: 12/29/2020  Expected End Date: 03/11/2021  This Visit's Progress: On track  Recent Progress: Not on track  Priority: High  Note:     Notes:     Task: Identify and Manage Comorbidities Completed 01/18/2021  Due Date: 03/11/2021  Outcome: Positive  Responsible User: Barbaraann Faster, RN  Note:   Care Management Activities:  01/18/21 denies worsening s/s Resolving due to duplicate goal  62/22/97- medication side effects monitored and managed - response to pharmacologic therapy monitored - signs/symptoms of comorbidities identified     Notes:     Care Plan : [place] of Care  Updates made by Barbaraann Faster, RN since 01/18/2021 12:00 AM     Problem: Complex Care Coordination Needs and disease management in patient with CHF, CAD, UTI, Falls   Priority: High     Long-Range Goal: Establish Plan of Care for Management Complex SDOH Barriers, disease management and Care Coordination Needs in patient with CHF, CAD, UTI, Falls   Start Date: 01/18/2021  This Visit's Progress: On track  Priority: High  Note:   Current Barriers:  Knowledge Deficits related to plan of care for management of CHF, CAD, and UTIs/Falls Care Coordination needs related to Limited education about CHF, CAD, UTIs/Falls* Kathy Murphy on today is reporting improvements with home management, mobility  and is glad to be in her own home  RN CM Clinical Goal(s):  Patient will verbalize basic understanding of  CHF, CAD, and UTI disease process and self health management plan for prevention of falls  through collaboration with RN Care manager, provider, and care team.    Interventions: Provide home management education verbally and via mail as needed on medical conditions Inter-disciplinary care team collaboration (see longitudinal plan of care) Evaluation of current treatment plan related to  self management and patient's adherence to plan as established by provider   Falls Interventions: Assessed for falls since last encounter;  Heart Failure Interventions: Discussed importance of daily weight and advised patient to weigh and record daily; Discussed the importance of keeping all appointments with provider; Assess for any worsening symptoms of CHF CAD Interventions:  Assessed understanding of CAD diagnosis Medications reviewed including medications utilized in CAD treatment plan Discussed importance of daily weight and advised patient to weigh and record daily; Discussed the importance of keeping all appointments with provider; Assess for any worsening symptoms of CAD, chest pain, etc UTIs- RN provided general education on urinary tract infections causes, perineal care, over the counter products to detect/test for UTIs and importance of outreach to MD for treatment  Patient Goals/Self-Care Activities: Patient will self administer medications as prescribed Patient will attend all scheduled provider appointments Patient will continue to perform ADL's independently Patient will continue to perform IADL's independently Patient will call provider office for new concerns or questions Patient will call RN CM to assist with advocating for her prn for worsening symptoms related to UTIs She denies worsening CHF & CAD symptoms. She denies falls since last outreach She and RN CM again reviewed perineal care. Kathy Murphy discussed her history of urinary tract infections. She states her urine is clear and she has completed her most recent antibiotics. She denies present burning, pain, odor frequency symptoms.  She confirms she has received UTI EMMI care after her  07/02/20 ED visit Patient needing to conclude the call as she's preparing to work with someone scheduled to visit her regarding her bills  Follow Up Plan:  The patient has been provided with contact information for the care management team and has been advised to call with any health related questions or concerns.  The care management team will reach out to the patient again over the next 30 business days.      Patient Active Problem List   Diagnosis Date Noted   Acute non-ST segment elevation myocardial infarction [facility]) 01/18/2021   Insomnia 09/06/9483   Metabolic encephalopathy 46/27/0350   Overactive bladder 01/18/2021   Thrombocytopenia ([place]) 12/12/2020   Leukocytosis 12/12/2020   Frequent falls 12/12/2020   Chest pain 12/12/2020   Pleurodynia    Nocturia 05/15/2019   Pain in thoracic spine 11/05/2017   History of total knee arthroplasty 05/18/2017   Dyslipidemia 04/05/2017   Osteoarthritis of right knee 04/03/2017   Obesity    Myocardial infarction [facility])    Urinary incontinence    Hypertensive disorder    Pain in right ankle and joints of right foot 01/18/2017   Right hip pain 01/18/2017   Seroma of breast 01/08/2017   Overweight 11/03/2016   Malignant neoplasm of female breast ([place]) 08/31/2016   Cellulitis of toe of right foot 08/17/2016   Chronic combined systolic and diastolic heart failure ([place]) 04/25/2016   Atherosclerotic heart disease of native coronary artery without angina pectoris 02/11/2016   CAD (coronary artery disease), native coronary artery 02/11/2016  Chronic total occlusion of coronary artery    Preinfarction syndrome (HCC)    NSTEMI (non-ST elevated myocardial infarction) ([place]) 02/07/2016   Abnormal radionuclide heart study 02/07/2016   Abnormal gait 11/03/2015   Encounter for general adult medical examination without abnormal findings 09/24/2014   Knee joint effusion 10/25/2012   Hyperlipidemia 09/18/2012   Irritable bowel syndrome 04/11/2012    Discharge from nipple 04/17/2011   Spondylolisthesis, congenital 08/15/2010   Hypertensive heart failure (HCC)    Arthritis    Muscle pain    Retinal detachment    Urinary tract infectious disease    Vomiting    Gastrointestinal hemorrhage    Hypothyroidism 10/29/2008   Gastro-esophageal reflux disease without esophagitis 10/29/2008   Essential hypertension 10/29/2008   Osteoarthritis of hip 10/29/2008    Plans Patient agrees to care plan and follow up within the next 30 business days     Peni Rupard L. Lavina Hamman, RN, BSN, [place] Coordinator Office number [phone] Main [facility] number [phone] Fax number [phone]

## 2021-02-07 DIAGNOSIS — Z85820 Personal history of malignant melanoma of skin: Secondary | ICD-10-CM | POA: Diagnosis not present

## 2021-02-07 DIAGNOSIS — D2262 Melanocytic nevi of left upper limb, including shoulder: Secondary | ICD-10-CM | POA: Diagnosis not present

## 2021-02-07 DIAGNOSIS — L821 Other seborrheic keratosis: Secondary | ICD-10-CM | POA: Diagnosis not present

## 2021-02-07 DIAGNOSIS — D485 Neoplasm of uncertain behavior of skin: Secondary | ICD-10-CM | POA: Diagnosis not present

## 2021-02-07 DIAGNOSIS — D1801 Hemangioma of skin and subcutaneous tissue: Secondary | ICD-10-CM | POA: Diagnosis not present

## 2021-02-07 DIAGNOSIS — Z85828 Personal history of other malignant neoplasm of skin: Secondary | ICD-10-CM | POA: Diagnosis not present

## 2021-02-07 DIAGNOSIS — C44519 Basal cell carcinoma of skin of other part of trunk: Secondary | ICD-10-CM | POA: Diagnosis not present

## 2021-02-07 DIAGNOSIS — D2261 Melanocytic nevi of right upper limb, including shoulder: Secondary | ICD-10-CM | POA: Diagnosis not present

## 2021-02-14 DIAGNOSIS — I110 Hypertensive heart disease with heart failure: Secondary | ICD-10-CM | POA: Diagnosis not present

## 2021-02-14 DIAGNOSIS — K219 Gastro-esophageal reflux disease without esophagitis: Secondary | ICD-10-CM | POA: Diagnosis not present

## 2021-02-14 DIAGNOSIS — I2510 Atherosclerotic heart disease of native coronary artery without angina pectoris: Secondary | ICD-10-CM | POA: Diagnosis not present

## 2021-02-14 DIAGNOSIS — E039 Hypothyroidism, unspecified: Secondary | ICD-10-CM | POA: Diagnosis not present

## 2021-02-14 DIAGNOSIS — G4709 Other insomnia: Secondary | ICD-10-CM | POA: Diagnosis not present

## 2021-02-17 ENCOUNTER — Other Ambulatory Visit: Payer: Self-pay | Admitting: *Deleted

## 2021-02-17 NOTE — Patient Outreach (Signed)
[facility]) Care Management  02/17/2021  Kathy Murphy 1927-07-28 [id number]   [facility] Unsuccessful outreach Kathy Murphy was referred to [facility] on 12/16/20 after discharge from hospital for post hospital services     Insurance Medicare / Lynda Rainwater  Last admission on 12/12/20-12/15/20 for NSTEMI   Transition of care services noted to be completed by primary care MD office staff- [facility] Dr Deirdre Pippins Transition of Care will be completed by primary care provider office who will refer to [facility] care management if needed.    Outreach attempt to the listed at the preferred outreach number in EPIC  [phone] No answer. THN RN CM left HIPAA [facility] Portability and Accountability Act) compliant voicemail message along with CM's contact info.   Plan: [facility] RN CM scheduled this patient for another call attempt within 4-7 business days Unsuccessful outreach on 02/17/21   Tessa Seaberry L. Lavina Hamman, RN, BSN, [place] Coordinator Office number [phone] Mobile number [phone]  Main [facility] number [phone] Fax number [phone]

## 2021-02-18 ENCOUNTER — Other Ambulatory Visit: Payer: Self-pay | Admitting: *Deleted

## 2021-02-18 NOTE — Patient Outreach (Signed)
[facility]) Care Management  02/18/2021  Kathy Murphy [birth year] [id number]   THN second Unsuccessful outreach Kathy Murphy was referred to [facility] on 12/16/20 after discharge from hospital for post hospital services     Insurance Medicare / Lynda Rainwater  Last admission on 12/12/20-12/15/20 for NSTEMI   Transition of care services noted to be completed by primary care MD office staff- [facility] Deirdre Pippins Transition of Care will be completed by primary care provider office who will refer to [facility] care management if needed.    Pt returned a call on 02/17/21  RN returned the call  Outreach attempt to the listed at the preferred outreach number in [place] [phone] No answer. THN RN CM left HIPAA [facility] Portability and Accountability Act) compliant voicemail message along with CM's contact info.    Plan: [facility] RN CM scheduled this patient for another call attempt within 4-7 business days Unsuccessful outreach on 02/17/21, 02/18/21     Kathy Settles L. Lavina Hamman, RN, BSN, [place] Coordinator Office number [phone] Mobile number [phone]  Main [facility] number [phone] Fax number [phone]

## 2021-02-21 DIAGNOSIS — C44519 Basal cell carcinoma of skin of other part of trunk: Secondary | ICD-10-CM | POA: Diagnosis not present

## 2021-02-22 ENCOUNTER — Ambulatory Visit: Payer: Self-pay | Admitting: *Deleted

## 2021-02-22 ENCOUNTER — Other Ambulatory Visit: Payer: Self-pay | Admitting: *Deleted

## 2021-02-22 ENCOUNTER — Other Ambulatory Visit: Payer: Self-pay

## 2021-02-22 NOTE — Patient Outreach (Addendum)
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  She does states she had an area biopsy on her lower back at her panty line and a friend has been assisting her in changing the bandages -schedule to arrive in 1-2 hours today She reports the site is reported to be without swelling, pain, drainage nor redness  Recommendations/Changes made from today's visit: Review of [facility]N progression with discussion of transfer to [facility] disease management program She does discuss how busy she stays but is open to quarterly outreaches  Discussed and encouraged safety, no recent falls but she reported she did not have her cane or walker with her at the time of the call today quickly and was trying to get to her phone when RN CM outreached. She was able to access her phone using her watch but had difficulty with transferring to her home phone. RN CM disconnected and called back after encouraging her to safely find her phone and be seated safely   Subjective: Kathy Murphy is an 87 y.o. year old female who is a primary patient of Kathy Bunting, MD. The care management team was consulted for assistance with care management and/or care coordination needs.    Telephonic RN Care Manager completed Telephone Visit today.   Objective:  Medications Reviewed Today     Reviewed by Kathy Faster, RN (Registered Nurse) on 01/18/21 at [place] List Status: <None>   Medication Order Taking? Sig Documenting Provider Last Dose Status  Informant  acetaminophen (TYLENOL) 650 MG CR tablet [id number] No Take 650 mg by mouth every 8 (eight) hours as needed for pain. [provider] Taking Active Self  amLODipine (NORVASC) 2.5 MG tablet [id number] No Take 1 tablet (2.5 mg total) by mouth daily. Hosie Poisson, MD Taking Active   anastrozole (ARIMIDEX) 1 MG tablet [id number] No TAKE 1 TABLET DAILY  Patient taking differently: Take 1 mg by mouth daily.   Magrinat, Virgie Dad, MD Taking Active Self  aspirin EC 81 MG tablet [id number] No Take 81 mg by mouth daily. [provider] Taking Active Self  Carboxymethylcellulose Sodium (REFRESH TEARS OP) [id number] No Apply 1-2 drops to eye daily. [provider] Taking Active Self  Cholecalciferol (VITAMIN D3) 125 MCG (5000 UT) CAPS [id number] No Take 1 capsule by mouth daily. [provider] Taking Active Self  ciprofloxacin (CIPRO) 500 MG tablet [id number] No Take 500 mg by mouth 2 (two) times daily.  Patient not taking: Reported on 01/04/2021   [provider] Not Taking Active   clopidogrel (PLAVIX) 75 MG tablet [id number] No Take 1 tablet (75 mg total) by mouth daily. Hosie Poisson, MD Taking Active   diazepam (VALIUM) 2 MG tablet [id number] No Take 1 tablet (2 mg total) by mouth every 6 (six) hours as needed for anxiety.  Patient not taking: Reported on 01/04/2021   Hosie Poisson, MD Not Taking Active   Diclofenac Sodium CR 100 MG 24 hr tablet [id number] No Take 100 mg by mouth daily. [provider] Taking Active Self  gabapentin (NEURONTIN) 100 MG capsule [id number] No Take 2 capsules (200 mg total) by mouth at bedtime.  Patient not taking: Reported on 01/04/2021   Hosie Poisson, MD Not Taking Active   isosorbide mononitrate (IMDUR) 30 MG 24 hr tablet [id number] No Take 1 tablet (30 mg total) by mouth daily. Hosie Poisson, MD Taking Active   levothyroxine (SYNTHROID, LEVOTHROID) 125 MCG tablet [id number] No Take 125 mcg by mouth daily before  breakfast. [provider] Taking Active Self  metoprolol succinate (TOPROL-XL) 100 MG 24 hr tablet [id number] No TAKE 1 TABLET DAILY  Patient taking differently: Take 100 mg by mouth daily.   Croitoru, Mihai, MD Taking Active Self  nitroGLYCERIN (NITROSTAT) 0.4 MG SL tablet [id number] No Place 1 tablet (0.4 mg total) under the tongue every 5 (five) minutes x 3 doses as needed for chest pain. Hosie Poisson, MD Taking Active   pantoprazole (PROTONIX) 40 MG tablet [id number] No Take 40 mg by mouth daily. [provider] Taking Active Self  rosuvastatin (CRESTOR) 10 MG tablet [id number] No Take 10 mg by mouth every morning. [provider] Taking Active Self             SDOH:  (Social Determinants of Health) assessments and interventions performed:    Care Plan  Review of patient past medical history, allergies, medications, health status, including review of consultants reports, laboratory and other test data, was performed as part of comprehensive evaluation for care management services.   Care Plan : Coronary Artery Disease (Adult)  Updates made by Kathy Faster, RN since 04/19/2021 12:00 AM  Completed 04/19/2021   Care Plan : Heart Failure (Adult)  Updates made by Kathy Faster, RN since 04/19/2021 12:00 AM  Completed 04/19/2021   Care Plan : RN Care Manager Plan of Care  Updates made by Kathy Faster, RN since 04/19/2021 12:00 AM     Problem: Complex Care Coordination Needs and disease management in patient with CHF, CAD, UTI, Falls   Priority: High     Long-Range Goal: Establish Plan of Care for Management Complex SDOH Barriers, disease management and Care Coordination Needs in patient with CHF, CAD, UTI, Falls   Start Date: 01/18/2021  This Visit's Progress: On track  Recent Progress: On track  Priority: High  Note:   Current Barriers:  Knowledge Deficits related to plan of care for management of CHF, CAD, and UTIs/Falls Care Coordination needs  related to Limited education about CHF, CAD, UTIs/Falls* 01/18/21 Kathy Murphy on today is reporting improvements with home management, mobility and is glad to be in her own home 02/21/21 recovered well per pt, active in home, community, church, strong support of friends, denies medical worsening concerns, care coordination needs or disease management concerns. Agrees to trying [facility] quarterly disease management outreaches  RN CM Clinical Goal(s):  Patient will verbalize basic understanding of  CHF, CAD, and UTI disease process and self health management plan for prevention of falls  through collaboration with RN Care manager, provider, and care team.   Interventions: Provide home management education verbally and via mail as needed on medical conditions Inter-disciplinary care team collaboration (see longitudinal plan of care) Evaluation of current treatment plan related to  self management and patient's adherence to plan as established by provider 02/22/21 Review of [facility] progression with discussion of transfer to [facility] disease management program She does discuss how busy she stays but is open to quarterly outreaches Discussed and encouraged safety, no recent falls but she reported she  did not have her cane or walker with her at the time of the call today quickly and was trying to get to her phone when RN CM outreached. She was able to access her phone using her watch but had difficulty with transferring to her home phone. RN CM disconnected and called back after encouraging her to safely find her phone and be seated safely   Falls Interventions: Assessed for falls since last encounter;  Heart Failure Interventions: Discussed importance of daily weight and advised patient to weigh and record daily; Discussed the importance of keeping all appointments with provider; Assess for any worsening symptoms of CHF CAD Interventions:  Assessed understanding of CAD diagnosis Medications reviewed including  medications utilized in CAD treatment plan Discussed importance of daily weight and advised patient to weigh and record daily; Discussed the importance of keeping all appointments with provider; Assess for any worsening symptoms of CAD, chest pain, etc UTIs- RN provided general education on urinary tract infections causes, perineal care, over the counter products to detect/test for UTIs and importance of outreach to MD for treatment  Patient Goals/Self-Care Activities: Patient will self administer medications as prescribed Patient will attend all scheduled provider appointments Patient will continue to perform ADL's independently Patient will continue to perform IADL's independently Patient will call provider office for new concerns or questions Patient will call RN CM to assist with advocating for her prn for worsening symptoms related to UTIs She denies worsening CHF & CAD symptoms. She denies falls since last outreach She and RN CM again reviewed perineal care. Kathy Longnecker discussed her history of urinary tract infections. She states her urine is clear and she has completed her most recent antibiotics. She denies present burning, pain, odor frequency symptoms.  She confirms she has received UTI EMMI care after her 07/02/20 ED visit Patient needing to conclude the call as she's preparing to work with someone scheduled to visit her regarding her bills  Follow Up Plan:   Patient agrees to transfer to [facility] disease management services warm transfer to be completed       Plan:  Patient agrees to transfer to [facility] disease management services  Warm transfer to be completed  Rayanne Padmanabhan L. Lavina Hamman, RN, BSN, [place] [facility] [place] Coordinator Office number [phone] Main [facility] number [phone] Fax number [phone]

## 2021-04-10 DIAGNOSIS — I2510 Atherosclerotic heart disease of native coronary artery without angina pectoris: Secondary | ICD-10-CM | POA: Diagnosis not present

## 2021-04-10 DIAGNOSIS — I10 Essential (primary) hypertension: Secondary | ICD-10-CM | POA: Diagnosis not present

## 2021-04-10 DIAGNOSIS — E785 Hyperlipidemia, unspecified: Secondary | ICD-10-CM | POA: Diagnosis not present

## 2021-04-20 NOTE — Patient Outreach (Addendum)
[facility]) Care Management  04/20/2021  Kathy Murphy April 19, 1927 [id number]  Referral received from Jackelyn Poling, RN for [place] Management services to assess patient needs for further education and support in managing CHF, HTN, and Falls.  Assigned to Emelia Loron, RN Care Coordinator.    [facility] Management Assistant [phone]

## 2021-04-28 ENCOUNTER — Other Ambulatory Visit: Payer: Self-pay | Admitting: *Deleted

## 2021-05-01 ENCOUNTER — Other Ambulatory Visit: Payer: Self-pay | Admitting: Cardiovascular Disease

## 2021-05-03 ENCOUNTER — Telehealth: Payer: Self-pay | Admitting: Hematology and Oncology

## 2021-05-03 NOTE — Telephone Encounter (Signed)
Sch per 2/20 inbasket, unable to speak with pt, left msg

## 2021-05-04 ENCOUNTER — Ambulatory Visit (INDEPENDENT_AMBULATORY_CARE_PROVIDER_SITE_OTHER): Payer: Medicare Other | Admitting: Cardiovascular Disease

## 2021-05-04 ENCOUNTER — Other Ambulatory Visit: Payer: Self-pay

## 2021-05-04 ENCOUNTER — Encounter: Payer: Self-pay | Admitting: Cardiovascular Disease

## 2021-05-04 VITALS — BP 121/76 | HR 76 | Ht 68.0 in | Wt 177.0 lb

## 2021-05-04 DIAGNOSIS — I25118 Atherosclerotic heart disease of native coronary artery with other forms of angina pectoris: Secondary | ICD-10-CM

## 2021-05-04 DIAGNOSIS — I10 Essential (primary) hypertension: Secondary | ICD-10-CM

## 2021-05-04 DIAGNOSIS — E663 Overweight: Secondary | ICD-10-CM | POA: Diagnosis not present

## 2021-05-04 DIAGNOSIS — E7800 Pure hypercholesterolemia, unspecified: Secondary | ICD-10-CM

## 2021-05-04 DIAGNOSIS — I5032 Chronic diastolic (congestive) heart failure: Secondary | ICD-10-CM | POA: Diagnosis not present

## 2021-05-04 NOTE — Progress Notes (Signed)
Cardiology Office Note    Date:  05/04/2021   ID:  Kathy, Murphy 09-01-1927, MRN [id number]  PCP:  Burnard Bunting, MD  Cardiologist:   Sanda Klein, MD   Chief Complaint  Patient presents with   Coronary Artery Disease          History of Present Illness:  Kathy Murphy is a 88 y.o. female returning in follow-up roughly 7 years after placement of 2 overlapping drug-eluting stents in the subtotally occluded right coronary artery (2.5 x 38 mm Synergy). She also had high-grade stenoses in the diagonal artery, ramus intermedius branch, distal LAD which were left for medical therapy. Presence of widespread coronary lesions may explain the "false negative" perfusion pattern but with depressed left ventricular systolic function (87%). Upper limit of normal LVEDP at cath.  Repeat echocardiogram in 2022 showed normal LV systolic function and wall motion.  Small ascending aortic aneurysm measured at 4.2 cm on echo  Despite extensive CAD, she has had very infrequent episodes of mild chest pressure over the last several years.  She is on triple therapy with low doses of amlodipine and isosorbide mononitrate as well as a relatively high dose of beta-blockers.  She was hospitalized in October with a urinary tract infection that caused major issues with altered mental status.  High-sensitivity troponin was borderline elevated at 169 and a flat pattern.  She had an echocardiogram during that admission that showed normal left ventricular systolic function with EF 55 to 60% and normal left ventricular regional wall motion.  An attempt was made for cardiac catheterization but she was unable to cooperate with the procedure.  The plan was finally made for just medical management and she was prescribed clopidogrel.  The intention was for her to take this for 12 months, but she misunderstood and stopped the medication after 30 days when she ran out of the prescription.  Amlodipine was added to her  chronic antianginal medications.  Denies recent problems with chest pressure, exertional dyspnea, orthopnea, PND or lower extremity edema.  She walks with a walker but remains independent and still drives her car.  Denies palpitations, syncope, focal neurological events, claudication.  As before frequent urination is her biggest complaint.  She has hot flashes attributable to chronic treatment with anastrozole.  In September 2018 she underwent lumpectomy for cancer of the right breast.  She did not receive radiation or chemotherapy.    She had right knee replacement in 2019.  She had a skin cancer removed from her left shoulder in December 2019.   Past Medical History:  Diagnosis Date   Arthritis    Breast cancer ([place])    Right   CAD (coronary artery disease)    Cancer (HCC)    thyroid   Chronic combined systolic and diastolic CHF (congestive heart failure) (HCC)    Dilated aortic root (HCC)    First degree AV block    GERD (gastroesophageal reflux disease)    GI bleed 1980   AFTER POLYP EXCISION   Heart disease, hypertensive    Hypercholesterolemia    Hypertension    Hypothyroidism    Incontinence of urine    Myalgia    Myocardial infarction ([place])    Obesity    Pain in joints    Retinal detachment    Unsteady gait    UTI (urinary tract infection)    Vision loss of left eye    Vomiting     Past Surgical History:  Procedure Laterality  Date   ABDOMINAL HYSTERECTOMY  1977   BREAST BIOPSY     BREAST LUMPECTOMY     x5   CARDIAC CATHETERIZATION N/A 02/08/2016   Procedure: Left Heart Cath and Coronary Angiography;  Surgeon: Belva Crome, MD;  Location: [facility];  Service: Cardiovascular;  Laterality: N/A;   CARDIAC CATHETERIZATION N/A 02/11/2016   Procedure: Coronary Stent Intervention;  Surgeon: Belva Crome, MD;  Location: [facility];  Service: Cardiovascular;  Laterality: N/A;   CATARACT EXTRACTION, BILATERAL     CERVICAL LAMINECTOMY     DENTAL SURGERY   1952   EVACUATION BREAST HEMATOMA Right 01/09/2017   Procedure: EVACUATION RIGHT MASTECTOMY FLUID COLLECTION;  Surgeon: Rolm Bookbinder, MD;  Location: [facility];  Service: General;  Laterality: Right;   [place]  2012   left   TOTAL KNEE ARTHROPLASTY Right 05/18/2017   Procedure: RIGHT TOTAL KNEE ARTHROPLASTY;  Surgeon: Netta Cedars, MD;  Location: [facility];  Service: Orthopedics;  Laterality: Right;   TOTAL MASTECTOMY Right 11/27/2016   TOTAL MASTECTOMY Right 11/27/2016   Procedure: RIGHT TOTAL MASTECTOMY;  Surgeon: Rolm Bookbinder, MD;  Location: [facility];  Service: General;  Laterality: Right;    Current Medications: Outpatient Medications Prior to Visit  Medication Sig Dispense Refill   anastrozole (ARIMIDEX) 1 MG tablet TAKE 1 TABLET DAILY (Patient taking differently: Take 1 mg by mouth daily.) 90 tablet 3   aspirin EC 81 MG tablet Take 81 mg by mouth daily.     Carboxymethylcellulose Sodium (REFRESH TEARS OP) Apply 1-2 drops to eye daily.     Cholecalciferol (VITAMIN D3) 125 MCG (5000 UT) CAPS Take 1 capsule by mouth daily.     diazepam (VALIUM) 2 MG tablet Take 1 tablet (2 mg total) by mouth every 6 (six) hours as needed for anxiety. 2 tablet 0   Diclofenac Sodium CR 100 MG 24 hr tablet Take 100 mg by mouth daily.     levothyroxine (SYNTHROID, LEVOTHROID) 125 MCG tablet Take 125 mcg by mouth daily before breakfast.     metoprolol succinate (TOPROL-XL) 100 MG 24 hr tablet TAKE 1 TABLET DAILY 90 tablet 3   pantoprazole (PROTONIX) 40 MG tablet Take 40 mg by mouth daily.     rosuvastatin (CRESTOR) 10 MG tablet Take 10 mg by mouth every morning.     acetaminophen (TYLENOL) 650 MG CR tablet Take 650 mg by mouth every 8 (eight) hours as needed for pain. (Patient not taking: Reported on 05/04/2021)     amLODipine (NORVASC) 2.5 MG tablet Take 1 tablet (2.5 mg total) by mouth daily. (Patient not taking: Reported on 05/04/2021)  30 tablet 2   ciprofloxacin (CIPRO) 500 MG tablet Take 500 mg by mouth 2 (two) times daily. (Patient not taking: Reported on 01/04/2021)     gabapentin (NEURONTIN) 100 MG capsule Take 2 capsules (200 mg total) by mouth at bedtime. (Patient not taking: Reported on 01/04/2021) 30 capsule 2   isosorbide mononitrate (IMDUR) 30 MG 24 hr tablet Take 1 tablet (30 mg total) by mouth daily. (Patient not taking: Reported on 05/04/2021) 30 tablet 2   nitroGLYCERIN (NITROSTAT) 0.4 MG SL tablet Place 1 tablet (0.4 mg total) under the tongue every 5 (five) minutes x 3 doses as needed for chest pain. (Patient not taking: Reported on 05/04/2021) 30 tablet 12   clopidogrel (PLAVIX) 75 MG tablet Take 1  tablet (75 mg total) by mouth daily. (Patient not taking: Reported on 05/04/2021) 30 tablet 2   No facility-administered medications prior to visit.     Allergies:   Azithromycin, Other, Adhesive [tape], and Latex   Social History   Socioeconomic History   Marital status: Widowed    Spouse name: Not on file   Number of children: Not on file   Years of education: Not on file   Highest education level: Not on file  Occupational History   Not on file  Tobacco Use   Smoking status: Never   Smokeless tobacco: Never  Vaping Use   Vaping Use: Never used  Substance and Sexual Activity   Alcohol use: No   Drug use: No   Sexual activity: Not on file  Other Topics Concern   Not on file  Social History Narrative   Not on file   Social Determinants of Health   Financial Resource Strain: Not on file  Food Insecurity: No Food Insecurity   Worried About Running Out of Food in the Last Year: Never true   [place] in the Last Year: Never true  Transportation Needs: No Transportation Needs   Lack of Transportation (Medical): No   Lack of Transportation (Non-Medical): No  Physical Activity: Not on file  Stress: No Stress Concern Present   Feeling of Stress : Only a little  Social Connections: Moderately  Integrated   Frequency of Communication with Friends and Family: Three times a week   Frequency of Social Gatherings with Friends and Family: Three times a week   Attends Religious Services: 1 to 4 times per year   Active Member of Clubs or Organizations: Yes   Attends Archivist Meetings: 1 to 4 times per year   Marital Status: Widowed     Family History:  The patient's family history includes Heart disease in her mother.   ROS:   Please see the history of present illness.    ROS All other systems are reviewed and are negative.   PHYSICAL EXAM:   VS:  BP 121/76    Pulse 76    Ht 5\' 8"  (1.727 m)    Wt 177 lb (80.3 kg)    SpO2 95%    BMI 26.91 kg/m       General: Alert, oriented x3, no distress, overweight. Head: no evidence of trauma, PERRL, EOMI, no exophtalmos or lid lag, no myxedema, no xanthelasma; normal ears, nose and oropharynx Neck: normal jugular venous pulsations and no hepatojugular reflux; brisk carotid pulses without delay and no carotid bruits Chest: clear to auscultation, no signs of consolidation by percussion or palpation, normal fremitus, symmetrical and full respiratory excursions Cardiovascular: normal position and quality of the apical impulse, regular rhythm, normal first and second heart sounds, no murmurs, rubs or gallops Abdomen: no tenderness or distention, no masses by palpation, no abnormal pulsatility or arterial bruits, normal bowel sounds, no hepatosplenomegaly Extremities: no clubbing, cyanosis or edema; 2+ radial, ulnar and brachial pulses bilaterally; 2+ right femoral, posterior tibial and dorsalis pedis pulses; 2+ left femoral, posterior tibial and dorsalis pedis pulses; no subclavian or femoral bruits Neurological: grossly nonfocal slow steady gait with walker. Psych: Normal mood and affect   Wt Readings from Last 3 Encounters:  05/04/21 177 lb (80.3 kg)  01/04/21 180 lb (81.6 kg)  12/15/20 179 lb 3.7 oz (81.3 kg)    Studies/Labs  Reviewed:   Echo 12/13/20:  1. Left ventricular ejection fraction, by  estimation, is 55 to 60%. The  left ventricle has normal function. The left ventricle demonstrates  regional wall motion abnormalities (see scoring diagram/findings for  description). There is moderate asymmetric  left ventricular hypertrophy of the septal segment. Left ventricular  diastolic parameters are consistent with Grade I diastolic dysfunction  (impaired relaxation).   2. Right ventricular systolic function is normal. The right ventricular  size is normal. There is moderately elevated pulmonary artery systolic  pressure. The estimated right ventricular systolic pressure is 29.5 mmHg.   3. Left atrial size was mildly dilated.   4. The mitral valve is abnormal. Mild mitral valve regurgitation.  Moderate mitral annular calcification.   5. The aortic valve is tricuspid. Aortic valve regurgitation is trivial.  Mild aortic valve sclerosis is present, with no evidence of aortic valve  stenosis. Aortic valve mean gradient measures 5.0 mmHg.   6. Aortic dilatation noted. There is mild dilatation of the aortic root,  measuring 42 mm.   7. The inferior vena cava is normal in size with greater than 50%  respiratory variability, suggesting right atrial pressure of 3 mmHg.   EKG:  EKG is ordered today.  It order sinus rhythm QS pattern in leads V1 V2 (old), LVH with secondary repolarization abnormalities, QTC 439 ms.  It is really not changed from previous tracings.   Labs: Lipid Panel     Component Value Date/Time   CHOL 82 12/13/2020 0536   CHOL 116 04/16/2017 1037   TRIG 31 12/13/2020 0536   HDL 40 (L) 12/13/2020 0536   HDL 45 04/16/2017 1037   CHOLHDL 2.1 12/13/2020 0536   VLDL 6 12/13/2020 0536   LDLCALC 36 12/13/2020 0536   LDLCALC 57 04/16/2017 1037   10/31/2018 Total cholesterol 116, HDL 38, LDL 66, triglycerides 59 Hemoglobin 13.7 on 01/01/2019  11/04/2019 Cholesterol 119, HDL 43, LDL 61,  triglycerides 75 Potassium 3.9, normal liver function tests, TSH 0.38  BMET    Component Value Date/Time   NA 135 12/15/2020 0346   NA 140 01/22/2017 0946   K 3.8 12/15/2020 0346   K 4.1 01/22/2017 0946   CL 103 12/15/2020 0346   CO2 25 12/15/2020 0346   CO2 29 01/22/2017 0946   GLUCOSE 101 (H) 12/15/2020 0346   GLUCOSE 101 01/22/2017 0946   BUN 23 12/15/2020 0346   BUN 17.9 01/22/2017 0946   CREATININE 0.72 12/15/2020 0346   CREATININE 0.8 01/22/2017 0946   CALCIUM 8.5 (L) 12/15/2020 0346   CALCIUM 9.1 01/22/2017 0946   GFRNONAA >60 12/15/2020 0346   GFRAA 57 (L) 01/01/2019 1241     ASSESSMENT:    1. Chronic diastolic heart failure ([place])   2. Coronary artery disease of native artery of native heart with stable angina pectoris ([place])   3. Hypercholesterolemia   4. Essential hypertension   5. Overweight (BMI 25.0-29.9)       PLAN:  In order of problems listed above:  CHF: Currently asymptomatic, albeit with a very low level of activity.  Has no clinical findings to suggest hypervolemia on exam.  Not receiving diuretics.  Most recent echocardiogram showed normalization of LVEF at 55 to 60%.   Had a transient reduction in LV systolic function presumably due to coronary insufficiency. CAD: On aspirin and on highly effective statin with lipid at target range.  On 3 antianginal medications with well-controlled symptoms (CCS functional class II).  Noninvasive studies unreliable for assessment with "false negative" nuclear stress test (possibly due to some degree of  balanced defects?). HLP: Parameters in target range on current statin prescription HTN: Well-controlled. Overweight: She is lost some weight and is only mildly overweight at this time.  Medication Adjustments/Labs and Tests Ordered: Current medicines are reviewed at length with the patient today.  Concerns regarding medicines are outlined above.  Medication changes, Labs and Tests ordered today are listed in the  Patient Instructions below. Patient Instructions  Medication Instructions:  No changes *If you need a refill on your cardiac medications before your next appointment, please call your pharmacy*   Lab Work: None ordered If you have labs (blood work) drawn today and your tests are completely normal, you will receive your results only by: [place] (if you have MyChart) OR A paper copy in the mail If you have any lab test that is abnormal or we need to change your treatment, we will call you to review the results.   Testing/Procedures: None ordered   Follow-Up: At [facility], you and your health needs are our priority.  As part of our continuing mission to provide you with exceptional heart care, we have created designated Provider Care Teams.  These Care Teams include your primary Cardiologist (physician) and Advanced Practice Providers (APPs -  Physician Assistants and Nurse Practitioners) who all work together to provide you with the care you need, when you need it.  We recommend signing up for the patient portal called "MyChart".  Sign up information is provided on this After Visit Summary.  MyChart is used to connect with patients for Virtual Visits (Telemedicine).  Patients are able to view lab/test results, encounter notes, upcoming appointments, etc.  Non-urgent messages can be sent to your provider as well.   To learn more about what you can do with MyChart, go to [email or web address].    Your next appointment:   12 month(s)  The format for your next appointment:   In Person  Provider:   Sanda Klein, MD {      Signed, Sanda Klein, MD  05/04/2021 4:11 PM    [facility] [address], [place], [place]  [address] Phone: [phone]; Fax: [phone]

## 2021-05-04 NOTE — Patient Instructions (Signed)

## 2021-05-06 ENCOUNTER — Other Ambulatory Visit: Payer: Self-pay

## 2021-05-06 MED ORDER — ANASTROZOLE 1 MG PO TABS: 1.0000 mg | ORAL_TABLET | Freq: Every day | ORAL | 0 refills | Status: DC

## 2021-05-10 ENCOUNTER — Other Ambulatory Visit: Payer: Self-pay

## 2021-05-10 ENCOUNTER — Inpatient Hospital Stay (HOSPITAL_BASED_OUTPATIENT_CLINIC_OR_DEPARTMENT_OTHER): Payer: Medicare Other | Admitting: Hematology and Oncology

## 2021-05-10 ENCOUNTER — Encounter: Payer: Self-pay | Admitting: Hematology and Oncology

## 2021-05-10 VITALS — BP 163/85 | HR 70 | Temp 97.5°F | Resp 18 | Wt 175.8 lb

## 2021-05-10 DIAGNOSIS — R7989 Other specified abnormal findings of blood chemistry: Secondary | ICD-10-CM | POA: Diagnosis not present

## 2021-05-10 DIAGNOSIS — Z8249 Family history of ischemic heart disease and other diseases of the circulatory system: Secondary | ICD-10-CM | POA: Diagnosis not present

## 2021-05-10 DIAGNOSIS — Z96651 Presence of right artificial knee joint: Secondary | ICD-10-CM | POA: Diagnosis present

## 2021-05-10 DIAGNOSIS — K219 Gastro-esophageal reflux disease without esophagitis: Secondary | ICD-10-CM | POA: Diagnosis present

## 2021-05-10 DIAGNOSIS — I5042 Chronic combined systolic (congestive) and diastolic (congestive) heart failure: Secondary | ICD-10-CM | POA: Diagnosis not present

## 2021-05-10 DIAGNOSIS — I959 Hypotension, unspecified: Secondary | ICD-10-CM | POA: Diagnosis not present

## 2021-05-10 DIAGNOSIS — I110 Hypertensive heart disease with heart failure: Secondary | ICD-10-CM | POA: Diagnosis not present

## 2021-05-10 DIAGNOSIS — E7800 Pure hypercholesterolemia, unspecified: Secondary | ICD-10-CM | POA: Diagnosis not present

## 2021-05-10 DIAGNOSIS — Z79811 Long term (current) use of aromatase inhibitors: Secondary | ICD-10-CM | POA: Insufficient documentation

## 2021-05-10 DIAGNOSIS — M8580 Other specified disorders of bone density and structure, unspecified site: Secondary | ICD-10-CM | POA: Diagnosis not present

## 2021-05-10 DIAGNOSIS — I214 Non-ST elevation (NSTEMI) myocardial infarction: Secondary | ICD-10-CM | POA: Diagnosis not present

## 2021-05-10 DIAGNOSIS — M8588 Other specified disorders of bone density and structure, other site: Secondary | ICD-10-CM | POA: Diagnosis not present

## 2021-05-10 DIAGNOSIS — Z79899 Other long term (current) drug therapy: Secondary | ICD-10-CM | POA: Insufficient documentation

## 2021-05-10 DIAGNOSIS — Z20822 Contact with and (suspected) exposure to covid-19: Secondary | ICD-10-CM | POA: Diagnosis not present

## 2021-05-10 DIAGNOSIS — R0789 Other chest pain: Secondary | ICD-10-CM | POA: Diagnosis not present

## 2021-05-10 DIAGNOSIS — I255 Ischemic cardiomyopathy: Secondary | ICD-10-CM | POA: Diagnosis not present

## 2021-05-10 DIAGNOSIS — Z9861 Coronary angioplasty status: Secondary | ICD-10-CM | POA: Diagnosis not present

## 2021-05-10 DIAGNOSIS — Z9011 Acquired absence of right breast and nipple: Secondary | ICD-10-CM | POA: Diagnosis not present

## 2021-05-10 DIAGNOSIS — C50411 Malignant neoplasm of upper-outer quadrant of right female breast: Secondary | ICD-10-CM

## 2021-05-10 DIAGNOSIS — Z170 Estrogen receptor positive status [ER+]: Secondary | ICD-10-CM | POA: Diagnosis not present

## 2021-05-10 DIAGNOSIS — E039 Hypothyroidism, unspecified: Secondary | ICD-10-CM | POA: Diagnosis not present

## 2021-05-10 DIAGNOSIS — Z7982 Long term (current) use of aspirin: Secondary | ICD-10-CM | POA: Diagnosis not present

## 2021-05-10 DIAGNOSIS — I248 Other forms of acute ischemic heart disease: Secondary | ICD-10-CM | POA: Diagnosis not present

## 2021-05-10 DIAGNOSIS — I252 Old myocardial infarction: Secondary | ICD-10-CM | POA: Diagnosis not present

## 2021-05-10 DIAGNOSIS — Z79890 Hormone replacement therapy (postmenopausal): Secondary | ICD-10-CM | POA: Diagnosis not present

## 2021-05-10 DIAGNOSIS — M1990 Unspecified osteoarthritis, unspecified site: Secondary | ICD-10-CM | POA: Diagnosis present

## 2021-05-10 DIAGNOSIS — Z96642 Presence of left artificial hip joint: Secondary | ICD-10-CM | POA: Diagnosis present

## 2021-05-10 DIAGNOSIS — R079 Chest pain, unspecified: Secondary | ICD-10-CM | POA: Diagnosis not present

## 2021-05-10 DIAGNOSIS — Z853 Personal history of malignant neoplasm of breast: Secondary | ICD-10-CM | POA: Diagnosis not present

## 2021-05-10 DIAGNOSIS — H5462 Unqualified visual loss, left eye, normal vision right eye: Secondary | ICD-10-CM | POA: Diagnosis not present

## 2021-05-10 DIAGNOSIS — D696 Thrombocytopenia, unspecified: Secondary | ICD-10-CM | POA: Diagnosis not present

## 2021-05-10 DIAGNOSIS — I25118 Atherosclerotic heart disease of native coronary artery with other forms of angina pectoris: Secondary | ICD-10-CM | POA: Diagnosis not present

## 2021-05-10 DIAGNOSIS — I440 Atrioventricular block, first degree: Secondary | ICD-10-CM | POA: Diagnosis present

## 2021-05-10 DIAGNOSIS — I10 Essential (primary) hypertension: Secondary | ICD-10-CM | POA: Diagnosis not present

## 2021-05-10 NOTE — Progress Notes (Signed)
[facility]  [phone] Fax:([phone]     ID: GRACIELYNN BIRKEL DOB: 23-Feb-1928  MR#: [id number]  VPX#:[id number]  Patient Care Team: Burnard Bunting, MD as PCP - General (Internal Medicine) Sanda Klein, MD as PCP - Cardiology (Cardiology) Magrinat, Virgie Dad, MD (Inactive) as Consulting Physician (Oncology) Rolm Bookbinder, MD as Consulting Physician (General Surgery) Croitoru, Dani Gobble, MD as Consulting Physician (Cardiology) Netta Cedars, MD as Consulting Physician (Orthopedic Surgery) Laretta Alstrom Argie Ramming, RN as [place] Management OTHER MD:  CHIEF COMPLAINT: Estrogen receptor positive invasive lobular breast cancer  CURRENT TREATMENT: Anastrozole   INTERVAL HISTORY:  Kathy Murphy was scheduled today for follow-up of her estrogen receptor positive breast cancer.  She is here for follow-up with her friend who is a Marine scientist and works at [employer].  She has been taking anastrozole as prescribed by Dr. Jana Hakim, denies any adverse effects with it. She states she has been having mammograms, last mammogram in spring, results are normal.  I cannot quite see this in her chart.  Last bone density happens to be around in 2019, osteopenia.  She states she is quite active, feels like she is 42, mostly limited by her arthritis, otherwise denies any new health complaints.   REVIEW OF SYSTEMS: Rest of the pertinent 10 point ROS reviewed and negative.   BREAST CANCER HISTORY: From the original intake note:  Kathy Murphy had a change in her right breast and was referred to [facility] where on 04/14/2016 bilateral diagnostic mammography with tomography and right breast ultrasound was obtained. The breast density was category B. In the central right breast there was an oval mass with no other findings of concern. Ultrasound located a benign 0.3 cm cyst in the upper inner quadrant of the right breast correlating with the mammography findings. Routine mammography was  recommended for one year.  However with further changes in the right breast note by the patient and her primary care MD, repeat right diagnostic mammography with tomography and repeat right breast ultrasonography 08/21/2016 now found a 3 cm area of asymmetry in the upper outer right breast which on physical exam measured approximately 2-1/2 cm at the 10:00 location 8 cm from the nipple. Ultrasound of this area showed a 0.5 cm hypoechoic mass with a larger 3 cm area of hazy isoechoic tissue with abnormal architecture. The right axilla showed 2 normal-appearing lymph nodes.  Biopsy of the right breast upper outer quadrant mass 08/28/2016 showed ([id number]) an invasive lobular carcinoma, E-cadherin negative, estrogen receptor 85% positive, progesterone receptor 100% positive, both with strong staining intensity, with an MIB-1 of 5%, and no HER-2 implication, the signals ratio being 1.49 and the number per cell 3.21.  The patient's subsequent history is as detailed below.   PAST MEDICAL HISTORY: Past Medical History:  Diagnosis Date   Arthritis    Breast cancer ([place])    Right   CAD (coronary artery disease)    Cancer (HCC)    thyroid   Chronic combined systolic and diastolic CHF (congestive heart failure) (HCC)    Dilated aortic root (HCC)    First degree AV block    GERD (gastroesophageal reflux disease)    GI bleed 1980   AFTER POLYP EXCISION   Heart disease, hypertensive    Hypercholesterolemia    Hypertension    Hypothyroidism    Incontinence of urine    Myalgia    Myocardial infarction ([place])    Obesity    Pain in joints  Retinal detachment    Unsteady gait    UTI (urinary tract infection)    Vision loss of left eye    Vomiting     PAST SURGICAL HISTORY: Past Surgical History:  Procedure Laterality Date   ABDOMINAL HYSTERECTOMY  1977   BREAST BIOPSY     BREAST LUMPECTOMY     x5   CARDIAC CATHETERIZATION N/A 02/08/2016   Procedure: Left Heart Cath and Coronary  Angiography;  Surgeon: Belva Crome, MD;  Location: [facility];  Service: Cardiovascular;  Laterality: N/A;   CARDIAC CATHETERIZATION N/A 02/11/2016   Procedure: Coronary Stent Intervention;  Surgeon: Belva Crome, MD;  Location: [facility];  Service: Cardiovascular;  Laterality: N/A;   CATARACT EXTRACTION, BILATERAL     CERVICAL LAMINECTOMY     DENTAL SURGERY  1952   EVACUATION BREAST HEMATOMA Right 01/09/2017   Procedure: EVACUATION RIGHT MASTECTOMY FLUID COLLECTION;  Surgeon: Rolm Bookbinder, MD;  Location: [facility];  Service: General;  Laterality: Right;   [place]  05-28-10   left   TOTAL KNEE ARTHROPLASTY Right 05/18/2017   Procedure: RIGHT TOTAL KNEE ARTHROPLASTY;  Surgeon: Netta Cedars, MD;  Location: [facility];  Service: Orthopedics;  Laterality: Right;   TOTAL MASTECTOMY Right 11/27/2016   TOTAL MASTECTOMY Right 11/27/2016   Procedure: RIGHT TOTAL MASTECTOMY;  Surgeon: Rolm Bookbinder, MD;  Location: [facility];  Service: General;  Laterality: Right;    FAMILY HISTORY Family History  Problem Relation Age of Onset   Heart disease Mother   The patient has little information regarding her father. Her mother died at age 59 in a nursing home from what the patient thinks may have been heart disease. The patient had no brothers and no sisters.   GYNECOLOGIC HISTORY:  No LMP recorded. Patient is postmenopausal. She thinks her first menstrual period may have been age 38. She never carried a child to term. She underwent hysterectomy with bilateral salpingo-oophorectomy in her 65s. She was on estrogen replacement until July 2018   SOCIAL HISTORY:  She is a retired Glass blower/designer for [employer]. Her husband died in 05/27/12. She lives by herself, with no pets, in a fairly large house on [place] acres she says. She does much of the house work herself. She also pays all her bills and keeps all her  accounts.     ADVANCED DIRECTIVES: In place. She has named her close friend [name] as her healthcare power of attorney. He can be reached at (cell) [phone] or (home) [place]: Social History   Tobacco Use   Smoking status: Never   Smokeless tobacco: Never  Vaping Use   Vaping Use: Never used  Substance Use Topics   Alcohol use: No   Drug use: No     Colonoscopy: November 2011/[facility]  PAP: 05-28-14  B[facility] density: 10/04/2016 [facility] showed a T score of -1.4 osteopenic   Allergies  Allergen Reactions   Azithromycin     Other reaction(s): Unknown   Other Other (See Comments)    SOME OF THE "MYCINS"...UNKNOWN REACTION.   Adhesive [Tape] Rash   Latex Rash    Current Outpatient Medications  Medication Sig Dispense Refill   acetaminophen (TYLENOL) 650 MG CR tablet Take 650 mg by mouth every 8 (eight) hours as needed for pain. (Patient not taking: Reported on 05/04/2021)  amLODipine (NORVASC) 2.5 MG tablet Take 1 tablet (2.5 mg total) by mouth daily. (Patient not taking: Reported on 05/04/2021) 30 tablet 2   anastrozole (ARIMIDEX) 1 MG tablet Take 1 tablet (1 mg total) by mouth daily. 90 tablet 0   aspirin EC 81 MG tablet Take 81 mg by mouth daily.     Carboxymethylcellulose Sodium (REFRESH TEARS OP) Apply 1-2 drops to eye daily.     Cholecalciferol (VITAMIN D3) 125 MCG (5000 UT) CAPS Take 1 capsule by mouth daily.     diazepam (VALIUM) 2 MG tablet Take 1 tablet (2 mg total) by mouth every 6 (six) hours as needed for anxiety. 2 tablet 0   Diclofenac Sodium CR 100 MG 24 hr tablet Take 100 mg by mouth daily.     isosorbide mononitrate (IMDUR) 30 MG 24 hr tablet Take 1 tablet (30 mg total) by mouth daily. (Patient not taking: Reported on 05/04/2021) 30 tablet 2   levothyroxine (SYNTHROID, LEVOTHROID) 125 MCG tablet Take 125 mcg by mouth daily before breakfast.     metoprolol succinate (TOPROL-XL) 100 MG 24 hr tablet TAKE 1 TABLET DAILY 90 tablet 3    nitroGLYCERIN (NITROSTAT) 0.4 MG SL tablet Place 1 tablet (0.4 mg total) under the tongue every 5 (five) minutes x 3 doses as needed for chest pain. (Patient not taking: Reported on 05/04/2021) 30 tablet 12   pantoprazole (PROTONIX) 40 MG tablet Take 40 mg by mouth daily.     rosuvastatin (CRESTOR) 10 MG tablet Take 10 mg by mouth every morning.     No current facility-administered medications for this visit.    OBJECTIVE:   Vitals:   05/10/21 1040  BP: (!) 163/85  Pulse: 70  Resp: 18  Temp: (!) 97.5 F (36.4 C)  SpO2: 95%     Body mass index is 26.73 kg/m.   Wt Readings from Last 3 Encounters:  05/10/21 175 lb 12.8 oz (79.7 kg)  05/04/21 177 lb (80.3 kg)  01/04/21 180 lb (81.6 kg)    Physical Exam Constitutional:      Appearance: Normal appearance.  Cardiovascular:     Rate and Rhythm: Normal rate and regular rhythm.  Chest:     Comments: Bilateral breasts inspected.  Right mastectomy scar appears to have no concern for recurrence.  Left breast inspected and palpated.  No regional adenopathy or palpable masses. Musculoskeletal:     Cervical back: Normal range of motion and neck supple. No rigidity.  Lymphadenopathy:     Cervical: No cervical adenopathy.  Neurological:     Mental Status: She is alert.     LAB RESULTS:  CMP     Component Value Date/Time   NA 135 12/15/2020 0346   NA 140 01/22/2017 0946   K 3.8 12/15/2020 0346   K 4.1 01/22/2017 0946   CL 103 12/15/2020 0346   CO2 25 12/15/2020 0346   CO2 29 01/22/2017 0946   GLUCOSE 101 (H) 12/15/2020 0346   GLUCOSE 101 01/22/2017 0946   BUN 23 12/15/2020 0346   BUN 17.9 01/22/2017 0946   CREATININE 0.72 12/15/2020 0346   CREATININE 0.8 01/22/2017 0946   CALCIUM 8.5 (L) 12/15/2020 0346   CALCIUM 9.1 01/22/2017 0946   PROT 6.7 12/11/2020 2313   PROT 6.5 01/22/2017 0946   ALBUMIN 2.8 (L) 12/15/2020 0346   ALBUMIN 3.1 (L) 01/22/2017 0946   AST 15 12/11/2020 2313   AST 10 01/22/2017 0946   ALT 11  12/11/2020 2313   ALT 12  01/22/2017 0946   ALKPHOS 83 12/11/2020 2313   ALKPHOS 92 01/22/2017 0946   BILITOT 0.9 12/11/2020 2313   BILITOT 0.37 01/22/2017 0946   GFRNONAA >60 12/15/2020 0346   GFRAA 57 (L) 01/01/2019 1241    No results found for: Ronnald Ramp, A1GS, A2GS, BETS, BETA2SER, GAMS, MSPIKE, SPEI  No results found for: Nils Pyle, [facility]  Lab Results  Component Value Date   WBC 4.7 12/15/2020   NEUTROABS 4.4 01/01/2019   HGB 11.2 (L) 12/15/2020   HCT 35.3 (L) 12/15/2020   MCV 95.7 12/15/2020   PLT 103 (L) 12/15/2020      Chemistry      Component Value Date/Time   NA 135 12/15/2020 0346   NA 140 01/22/2017 0946   K 3.8 12/15/2020 0346   K 4.1 01/22/2017 0946   CL 103 12/15/2020 0346   CO2 25 12/15/2020 0346   CO2 29 01/22/2017 0946   BUN 23 12/15/2020 0346   BUN 17.9 01/22/2017 0946   CREATININE 0.72 12/15/2020 0346   CREATININE 0.8 01/22/2017 0946      Component Value Date/Time   CALCIUM 8.5 (L) 12/15/2020 0346   CALCIUM 9.1 01/22/2017 0946   ALKPHOS 83 12/11/2020 2313   ALKPHOS 92 01/22/2017 0946   AST 15 12/11/2020 2313   AST 10 01/22/2017 0946   ALT 11 12/11/2020 2313   ALT 12 01/22/2017 0946   BILITOT 0.9 12/11/2020 2313   BILITOT 0.37 01/22/2017 0946       No results found for: LABCA2  No components found for: CZYSAY301  No results for input(s): INR in the last 168 hours.  Urinalysis    Component Value Date/Time   COLORURINE AMBER (A) 12/12/2020 2337   APPEARANCEUR CLOUDY (A) 12/12/2020 2337   LABSPEC 1.018 12/12/2020 2337   PHURINE 5.0 12/12/2020 2337   GLUCOSEU 50 (A) 12/12/2020 2337   HGBUR LARGE (A) 12/12/2020 2337   BILIRUBINUR NEGATIVE 12/12/2020 2337   KETONESUR NEGATIVE 12/12/2020 2337   PROTEINUR 100 (A) 12/12/2020 2337   UROBILINOGEN 0.2 12/26/2012 0843   NITRITE POSITIVE (A) 12/12/2020 2337   LEUKOCYTESUR LARGE (A) 12/12/2020 2337    STUDIES:  No results found.   ELIGIBLE FOR  AVAILABLE RESEARCH PROTOCOL: No  ASSESSMENT:   88 y.o. [place], [place] woman status post right breast upper outer quadrant biopsy 08/28/2016 for a clinical T2 N0, stage IB invasive lobular carcinoma, grade not stated, estrogen and progesterone strongly positive, HER-2 nonamplified, with an MIB-1 of 5%.  (1) anastrozole started 09/25/2016  (a) bone density on 10/04/2016 at [facility] showed a T score of -1.4 osteopenic  (b) repeat bone density 01/16/2018 showed a T score of -1.6.  (2) Status post right mastectomy without sentinel lymph node sampling 11/27/2016 for a pT2 cN0, stage IB invasive lobular carcinoma, grade 1, with negative margins.  (3) no adjuvant radiation indicated   PLAN:  Patient is here for follow-up on adjuvant anastrozole.  She has been tolerating this very well.  No adverse effects reported. On physical examination no concern for breast cancer recurrence.  She was encouraged to schedule her left mammogram every year as long as she is functional. She will return to clinic in about 5 months, she will complete anastrozole for 5 years in July 2023.  After 5 years of antiestrogen therapy, she can continue to follow-up with her PCP with an annual mammogram as long as her performance status is decent.  She can return to oncology as needed after July's appointment. Last  bone density in 2019 osteopenia, will order repeat bone density.  Continue Vit D as recommended.  Total time: 30 minutes. *Total Encounter Time as defined by the Centers for Medicare and Medicaid Services includes, in addition to the face-to-face time of a patient visit (documented in the note above) non-face-to-face time: obtaining and reviewing outside history, ordering and reviewing medications, tests or procedures, care coordination (communications with other health care professionals or caregivers) and documentation in the medical record.

## 2021-05-11 ENCOUNTER — Emergency Department (HOSPITAL_COMMUNITY): Payer: Medicare Other

## 2021-05-11 ENCOUNTER — Inpatient Hospital Stay (HOSPITAL_COMMUNITY)
Admission: EM | Admit: 2021-05-11 | Discharge: 2021-05-13 | DRG: 281 | Disposition: A | Discharge status: Home / Self Care | Payer: Medicare Other | Attending: Student | Admitting: Student

## 2021-05-11 ENCOUNTER — Other Ambulatory Visit: Payer: Self-pay

## 2021-05-11 DIAGNOSIS — Z79890 Hormone replacement therapy (postmenopausal): Secondary | ICD-10-CM

## 2021-05-11 DIAGNOSIS — R0789 Other chest pain: Secondary | ICD-10-CM | POA: Diagnosis not present

## 2021-05-11 DIAGNOSIS — R079 Chest pain, unspecified: Secondary | ICD-10-CM | POA: Diagnosis not present

## 2021-05-11 DIAGNOSIS — I214 Non-ST elevation (NSTEMI) myocardial infarction: Principal | ICD-10-CM | POA: Diagnosis present

## 2021-05-11 DIAGNOSIS — Z20822 Contact with and (suspected) exposure to covid-19: Secondary | ICD-10-CM | POA: Diagnosis present

## 2021-05-11 DIAGNOSIS — I5042 Chronic combined systolic (congestive) and diastolic (congestive) heart failure: Secondary | ICD-10-CM | POA: Diagnosis present

## 2021-05-11 DIAGNOSIS — I5020 Unspecified systolic (congestive) heart failure: Secondary | ICD-10-CM

## 2021-05-11 DIAGNOSIS — Z7982 Long term (current) use of aspirin: Secondary | ICD-10-CM

## 2021-05-11 DIAGNOSIS — Z96651 Presence of right artificial knee joint: Secondary | ICD-10-CM | POA: Diagnosis present

## 2021-05-11 DIAGNOSIS — I959 Hypotension, unspecified: Secondary | ICD-10-CM | POA: Diagnosis not present

## 2021-05-11 DIAGNOSIS — K219 Gastro-esophageal reflux disease without esophagitis: Secondary | ICD-10-CM | POA: Diagnosis present

## 2021-05-11 DIAGNOSIS — I255 Ischemic cardiomyopathy: Secondary | ICD-10-CM | POA: Diagnosis present

## 2021-05-11 DIAGNOSIS — I2510 Atherosclerotic heart disease of native coronary artery without angina pectoris: Secondary | ICD-10-CM

## 2021-05-11 DIAGNOSIS — Z96642 Presence of left artificial hip joint: Secondary | ICD-10-CM | POA: Diagnosis present

## 2021-05-11 DIAGNOSIS — Z9861 Coronary angioplasty status: Secondary | ICD-10-CM

## 2021-05-11 DIAGNOSIS — M1990 Unspecified osteoarthritis, unspecified site: Secondary | ICD-10-CM | POA: Diagnosis present

## 2021-05-11 DIAGNOSIS — Z853 Personal history of malignant neoplasm of breast: Secondary | ICD-10-CM

## 2021-05-11 DIAGNOSIS — I25118 Atherosclerotic heart disease of native coronary artery with other forms of angina pectoris: Secondary | ICD-10-CM | POA: Diagnosis present

## 2021-05-11 DIAGNOSIS — E7800 Pure hypercholesterolemia, unspecified: Secondary | ICD-10-CM | POA: Diagnosis present

## 2021-05-11 DIAGNOSIS — H5462 Unqualified visual loss, left eye, normal vision right eye: Secondary | ICD-10-CM | POA: Diagnosis present

## 2021-05-11 DIAGNOSIS — D696 Thrombocytopenia, unspecified: Secondary | ICD-10-CM | POA: Diagnosis present

## 2021-05-11 DIAGNOSIS — E039 Hypothyroidism, unspecified: Secondary | ICD-10-CM | POA: Diagnosis present

## 2021-05-11 DIAGNOSIS — Z9011 Acquired absence of right breast and nipple: Secondary | ICD-10-CM

## 2021-05-11 DIAGNOSIS — I110 Hypertensive heart disease with heart failure: Secondary | ICD-10-CM | POA: Diagnosis present

## 2021-05-11 DIAGNOSIS — I10 Essential (primary) hypertension: Secondary | ICD-10-CM | POA: Diagnosis not present

## 2021-05-11 DIAGNOSIS — E785 Hyperlipidemia, unspecified: Secondary | ICD-10-CM | POA: Diagnosis present

## 2021-05-11 DIAGNOSIS — Z8249 Family history of ischemic heart disease and other diseases of the circulatory system: Secondary | ICD-10-CM

## 2021-05-11 DIAGNOSIS — Z79899 Other long term (current) drug therapy: Secondary | ICD-10-CM

## 2021-05-11 DIAGNOSIS — I252 Old myocardial infarction: Secondary | ICD-10-CM

## 2021-05-11 DIAGNOSIS — I440 Atrioventricular block, first degree: Secondary | ICD-10-CM | POA: Diagnosis present

## 2021-05-11 LAB — CBC
HCT: 42.2 % (ref 36.0–46.0)
Hemoglobin: 13.5 g/dL (ref 12.0–15.0)
MCH: 30.8 pg (ref 26.0–34.0)
MCHC: 32 g/dL (ref 30.0–36.0)
MCV: 96.3 fL (ref 80.0–100.0)
Platelets: 138 10*3/uL — ABNORMAL LOW (ref 150–400)
RBC: 4.38 MIL/uL (ref 3.87–5.11)
RDW: 13.1 % (ref 11.5–15.5)
WBC: 7.5 10*3/uL (ref 4.0–10.5)
nRBC: 0 % (ref 0.0–0.2)

## 2021-05-11 LAB — BASIC METABOLIC PANEL
Anion gap: 8 (ref 5–15)
BUN: 27 mg/dL — ABNORMAL HIGH (ref 8–23)
CO2: 26 mmol/L (ref 22–32)
Calcium: 9.4 mg/dL (ref 8.9–10.3)
Chloride: 106 mmol/L (ref 98–111)
Creatinine, Ser: 0.94 mg/dL (ref 0.44–1.00)
GFR, Estimated: 57 mL/min — ABNORMAL LOW (ref >60)
Glucose, Bld: 127 mg/dL — ABNORMAL HIGH (ref 70–99)
Potassium: 3.9 mmol/L (ref 3.5–5.1)
Sodium: 140 mmol/L (ref 135–145)

## 2021-05-11 LAB — TROPONIN I (HIGH SENSITIVITY): Troponin I (High Sensitivity): 36 ng/L — ABNORMAL HIGH (ref <18)

## 2021-05-11 NOTE — ED Provider Triage Note (Signed)
Emergency Medicine Provider Triage Evaluation Note ? ?Kathy Murphy , a 88 y.o. female  was evaluated in triage.  Pt complains of chest pain since this morning.  Patient reports that she had episode of chest pain which resolved she then reported that she went to church and had a second episode of chest pain.  Patient given nitro in route with EMS and states that she had relief of chest pain.  Patient has history of heart attack at some point in the past, she is unsure when.  She states that she sees cardiologist who advised her that "there is nothing else he can do for her". ? ?Review of Systems  ?Positive: Chest pain, weakness ?Negative: Nausea, vomiting, diarrhea, abdominal pain, fever ? ?Physical Exam  ?There were no vitals taken for this visit. ?Gen:   Awake, no distress   ?Resp:  Normal effort  ?MSK:   Moves extremities without difficulty  ?Other:   ? ?Medical Decision Making  ?Medically screening exam initiated at 9:12 PM.  Appropriate orders placed.  Kathy Murphy was informed that the remainder of the evaluation will be completed by another provider, this initial triage assessment does not replace that evaluation, and the importance of remaining in the ED until their evaluation is complete. ? ? ?  ?Azucena Cecil, PA-C ?05/11/21 2113 ? ?

## 2021-05-11 NOTE — ED Triage Notes (Signed)
Pt here via [facility] from home for cp that started today. Pt had an episode of this while at church and then it happened again tonight. Pt apparently also received a scam phone call right before 2nd episode. EMS gave 324mg  ASA and 1 SL nitro w/ relief. Pt now denies cp, 20g LFA, 132/80.  ?

## 2021-05-12 ENCOUNTER — Encounter (HOSPITAL_COMMUNITY): Payer: Self-pay | Admitting: Emergency Medicine

## 2021-05-12 ENCOUNTER — Inpatient Hospital Stay (HOSPITAL_COMMUNITY): Payer: Medicare Other

## 2021-05-12 DIAGNOSIS — I10 Essential (primary) hypertension: Secondary | ICD-10-CM | POA: Diagnosis not present

## 2021-05-12 DIAGNOSIS — I214 Non-ST elevation (NSTEMI) myocardial infarction: Secondary | ICD-10-CM | POA: Diagnosis present

## 2021-05-12 DIAGNOSIS — K219 Gastro-esophageal reflux disease without esophagitis: Secondary | ICD-10-CM | POA: Diagnosis present

## 2021-05-12 DIAGNOSIS — I25118 Atherosclerotic heart disease of native coronary artery with other forms of angina pectoris: Secondary | ICD-10-CM | POA: Diagnosis present

## 2021-05-12 DIAGNOSIS — Z9011 Acquired absence of right breast and nipple: Secondary | ICD-10-CM | POA: Diagnosis not present

## 2021-05-12 DIAGNOSIS — R079 Chest pain, unspecified: Secondary | ICD-10-CM | POA: Diagnosis not present

## 2021-05-12 DIAGNOSIS — E7800 Pure hypercholesterolemia, unspecified: Secondary | ICD-10-CM | POA: Diagnosis present

## 2021-05-12 DIAGNOSIS — Z9861 Coronary angioplasty status: Secondary | ICD-10-CM | POA: Diagnosis not present

## 2021-05-12 DIAGNOSIS — Z8249 Family history of ischemic heart disease and other diseases of the circulatory system: Secondary | ICD-10-CM | POA: Diagnosis not present

## 2021-05-12 DIAGNOSIS — I252 Old myocardial infarction: Secondary | ICD-10-CM | POA: Diagnosis not present

## 2021-05-12 DIAGNOSIS — D696 Thrombocytopenia, unspecified: Secondary | ICD-10-CM | POA: Diagnosis present

## 2021-05-12 DIAGNOSIS — I440 Atrioventricular block, first degree: Secondary | ICD-10-CM | POA: Diagnosis present

## 2021-05-12 DIAGNOSIS — Z79890 Hormone replacement therapy (postmenopausal): Secondary | ICD-10-CM | POA: Diagnosis not present

## 2021-05-12 DIAGNOSIS — I248 Other forms of acute ischemic heart disease: Secondary | ICD-10-CM

## 2021-05-12 DIAGNOSIS — I959 Hypotension, unspecified: Secondary | ICD-10-CM | POA: Diagnosis present

## 2021-05-12 DIAGNOSIS — Z96642 Presence of left artificial hip joint: Secondary | ICD-10-CM | POA: Diagnosis present

## 2021-05-12 DIAGNOSIS — Z853 Personal history of malignant neoplasm of breast: Secondary | ICD-10-CM | POA: Diagnosis not present

## 2021-05-12 DIAGNOSIS — I110 Hypertensive heart disease with heart failure: Secondary | ICD-10-CM | POA: Diagnosis present

## 2021-05-12 DIAGNOSIS — H5462 Unqualified visual loss, left eye, normal vision right eye: Secondary | ICD-10-CM | POA: Diagnosis present

## 2021-05-12 DIAGNOSIS — I255 Ischemic cardiomyopathy: Secondary | ICD-10-CM | POA: Diagnosis present

## 2021-05-12 DIAGNOSIS — Z7982 Long term (current) use of aspirin: Secondary | ICD-10-CM | POA: Diagnosis not present

## 2021-05-12 DIAGNOSIS — Z96651 Presence of right artificial knee joint: Secondary | ICD-10-CM | POA: Diagnosis present

## 2021-05-12 DIAGNOSIS — M1990 Unspecified osteoarthritis, unspecified site: Secondary | ICD-10-CM | POA: Diagnosis present

## 2021-05-12 DIAGNOSIS — R7989 Other specified abnormal findings of blood chemistry: Secondary | ICD-10-CM | POA: Diagnosis not present

## 2021-05-12 DIAGNOSIS — Z20822 Contact with and (suspected) exposure to covid-19: Secondary | ICD-10-CM | POA: Diagnosis present

## 2021-05-12 DIAGNOSIS — Z79899 Other long term (current) drug therapy: Secondary | ICD-10-CM | POA: Diagnosis not present

## 2021-05-12 DIAGNOSIS — I5042 Chronic combined systolic (congestive) and diastolic (congestive) heart failure: Secondary | ICD-10-CM | POA: Diagnosis present

## 2021-05-12 DIAGNOSIS — E039 Hypothyroidism, unspecified: Secondary | ICD-10-CM | POA: Diagnosis present

## 2021-05-12 LAB — RESP PANEL BY RT-PCR (FLU A&B, COVID) ARPGX2
Influenza A by PCR: NEGATIVE
Influenza B by PCR: NEGATIVE
SARS Coronavirus 2 by RT PCR: NEGATIVE

## 2021-05-12 LAB — BASIC METABOLIC PANEL
Anion gap: 9 (ref 5–15)
BUN: 18 mg/dL (ref 8–23)
CO2: 26 mmol/L (ref 22–32)
Calcium: 9.2 mg/dL (ref 8.9–10.3)
Chloride: 105 mmol/L (ref 98–111)
Creatinine, Ser: 0.77 mg/dL (ref 0.44–1.00)
GFR, Estimated: >60 mL/min (ref >60)
Glucose, Bld: 108 mg/dL — ABNORMAL HIGH (ref 70–99)
Potassium: 3.5 mmol/L (ref 3.5–5.1)
Sodium: 140 mmol/L (ref 135–145)

## 2021-05-12 LAB — BRAIN NATRIURETIC PEPTIDE: B Natriuretic Peptide: 181.3 pg/mL — ABNORMAL HIGH (ref 0.0–100.0)

## 2021-05-12 LAB — ECHOCARDIOGRAM COMPLETE
Calc EF: 50.1 %
Height: 68 in
S' Lateral: 3.6 cm
Single Plane A2C EF: 53 %
Single Plane A4C EF: 49.4 %
Weight: 2811.31 oz

## 2021-05-12 LAB — TSH: TSH: 0.219 u[IU]/mL — ABNORMAL LOW (ref 0.350–4.500)

## 2021-05-12 LAB — HEMOGLOBIN A1C
Hgb A1c MFr Bld: 5.1 % (ref 4.8–5.6)
Mean Plasma Glucose: 99.67 mg/dL

## 2021-05-12 LAB — TROPONIN I (HIGH SENSITIVITY)
Troponin I (High Sensitivity): 124 ng/L (ref <18)
Troponin I (High Sensitivity): 285 ng/L (ref <18)
Troponin I (High Sensitivity): 345 ng/L (ref <18)

## 2021-05-12 LAB — HEPARIN LEVEL (UNFRACTIONATED): Heparin Unfractionated: 0.22 IU/mL — ABNORMAL LOW (ref 0.30–0.70)

## 2021-05-12 LAB — MRSA NEXT GEN BY PCR, NASAL: MRSA by PCR Next Gen: NOT DETECTED

## 2021-05-12 MED ORDER — ANASTROZOLE 1 MG PO TABS
1.0000 mg | ORAL_TABLET | Freq: Every day | ORAL | Status: DC
Start: 2021-05-12 — End: 2021-05-13
  Administered 2021-05-12 – 2021-05-13 (×2): 1 mg via ORAL
  Filled 2021-05-12 (×2): qty 1

## 2021-05-12 MED ORDER — METOPROLOL SUCCINATE ER 100 MG PO TB24
100.0000 mg | ORAL_TABLET | Freq: Every day | ORAL | Status: DC
  Administered 2021-05-12 – 2021-05-13 (×2): 100 mg via ORAL
  Filled 2021-05-12: qty 4
  Filled 2021-05-12: qty 1

## 2021-05-12 MED ORDER — NITROGLYCERIN 2 % TD OINT
0.5000 [in_us] | TOPICAL_OINTMENT | Freq: Once | TRANSDERMAL | Status: AC
  Administered 2021-05-12: 0.5 [in_us] via TOPICAL
  Filled 2021-05-12: qty 1

## 2021-05-12 MED ORDER — ISOSORBIDE MONONITRATE ER 30 MG PO TB24
30.0000 mg | ORAL_TABLET | Freq: Every day | ORAL | Status: DC
  Administered 2021-05-12 – 2021-05-13 (×2): 30 mg via ORAL
  Filled 2021-05-12 (×2): qty 1

## 2021-05-12 MED ORDER — PANTOPRAZOLE SODIUM 40 MG PO TBEC
40.0000 mg | DELAYED_RELEASE_TABLET | Freq: Every day | ORAL | Status: DC
  Administered 2021-05-13: 40 mg via ORAL
  Filled 2021-05-12 (×2): qty 1

## 2021-05-12 MED ORDER — ACETAMINOPHEN 325 MG PO TABS: 650.0000 mg | ORAL_TABLET | ORAL | Status: DC | PRN

## 2021-05-12 MED ORDER — ONDANSETRON HCL 4 MG/2ML IJ SOLN: 4.0000 mg | Freq: Four times a day (QID) | INTRAMUSCULAR | Status: DC | PRN

## 2021-05-12 MED ORDER — HEPARIN (PORCINE) 25000 UT/250ML-% IV SOLN: 12.0000 [IU]/kg/h | INTRAVENOUS | Status: DC

## 2021-05-12 MED ORDER — AMLODIPINE BESYLATE 5 MG PO TABS
2.5000 mg | ORAL_TABLET | Freq: Every day | ORAL | Status: DC
  Administered 2021-05-12: 2.5 mg via ORAL
  Filled 2021-05-12: qty 1

## 2021-05-12 MED ORDER — ROSUVASTATIN CALCIUM 5 MG PO TABS
10.0000 mg | ORAL_TABLET | ORAL | Status: DC
  Administered 2021-05-13: 10 mg via ORAL
  Filled 2021-05-12 (×2): qty 2

## 2021-05-12 MED ORDER — ASPIRIN EC 81 MG PO TBEC
81.0000 mg | DELAYED_RELEASE_TABLET | Freq: Every day | ORAL | Status: DC
  Administered 2021-05-13: 81 mg via ORAL
  Filled 2021-05-12 (×2): qty 1

## 2021-05-12 MED ORDER — LEVOTHYROXINE SODIUM 25 MCG PO TABS
125.0000 ug | ORAL_TABLET | Freq: Every day | ORAL | Status: DC
  Administered 2021-05-12 – 2021-05-13 (×2): 125 ug via ORAL
  Filled 2021-05-12 (×2): qty 1

## 2021-05-12 MED ORDER — NITROGLYCERIN 0.4 MG SL SUBL: 0.4000 mg | SUBLINGUAL_TABLET | SUBLINGUAL | Status: DC | PRN

## 2021-05-12 MED ORDER — HEPARIN BOLUS VIA INFUSION
3000.0000 [IU] | Freq: Once | INTRAVENOUS | Status: AC
  Administered 2021-05-12: 3000 [IU] via INTRAVENOUS
  Filled 2021-05-12: qty 3000

## 2021-05-12 MED ORDER — POLYVINYL ALCOHOL 1.4 % OP SOLN
2.0000 [drp] | Freq: Every day | OPHTHALMIC | Status: DC
  Administered 2021-05-13: 2 [drp] via OPHTHALMIC
  Filled 2021-05-12: qty 15

## 2021-05-12 MED ORDER — HEPARIN (PORCINE) 25000 UT/250ML-% IV SOLN
1150.0000 [IU]/h | INTRAVENOUS | Status: DC
  Administered 2021-05-12: 1000 [IU]/h via INTRAVENOUS
  Administered 2021-05-13: 1150 [IU]/h via INTRAVENOUS
  Filled 2021-05-12 (×2): qty 250

## 2021-05-12 NOTE — ED Notes (Signed)
Echo Technician at the bedside 

## 2021-05-12 NOTE — ED Notes (Signed)
RN placed BP cuff, Pulse Ox, and cardiac monitoring on pt.  ?

## 2021-05-12 NOTE — Progress Notes (Signed)
ANTICOAGULATION CONSULT NOTE - Initial Consult ? ?Pharmacy Consult for heparin ?Indication: chest pain/ACS ? ?Allergies  ?Allergen Reactions  ? Azithromycin   ?  Other reaction(s): Unknown  ? Other Other (See Comments)  ?  SOME OF THE "MYCINS"...UNKNOWN REACTION.  ? Adhesive [Tape] Rash  ? Latex Rash  ? ? ?Patient Measurements: ?Height: 5\' 8"  (172.7 cm) ?Weight: 79.7 kg (175 lb 11.3 oz) ?IBW/kg (Calculated) : 63.9 ? ?Vital Signs: ?Temp: 98.6 ?F (37 ?C) (03/02 0158) ?BP: 179/81 (03/02 0158) ?Pulse Rate: 62 (03/02 0345) ? ?Labs: ?Recent Labs  ?  05/11/21 ?2108 05/11/21 ?2347  ?HGB 13.5  --   ?HCT 42.2  --   ?PLT 138*  --   ?CREATININE 0.94  --   ?TROPONINIHS 36* 124*  ? ? ?Estimated Creatinine Clearance: 43.2 mL/min (by C-G formula based on SCr of 0.94 mg/dL). ? ? ?Medical History: ?Past Medical History:  ?Diagnosis Date  ? Arthritis   ? Breast cancer ([place])   ? Right  ? CAD (coronary artery disease)   ? Cancer [facility])   ? thyroid  ? Chronic combined systolic and diastolic CHF (congestive heart failure) ([place])   ? Dilated aortic root ([place])   ? First degree AV block   ? GERD (gastroesophageal reflux disease)   ? GI bleed 1980  ? AFTER POLYP EXCISION  ? Heart disease, hypertensive   ? Hypercholesterolemia   ? Hypertension   ? Hypothyroidism   ? Incontinence of urine   ? Myalgia   ? Myocardial infarction [facility])   ? Obesity   ? Pain in joints   ? Retinal detachment   ? Unsteady gait   ? UTI (urinary tract infection)   ? Vision loss of left eye   ? Vomiting   ? ? ?Assessment: ?88yo female c/o CP, initial troponin mildly elevated and now rising, to begin heparin. ? ?Goal of Therapy:  ?Heparin level 0.3-0.7 units/ml ?Monitor platelets by anticoagulation protocol: Yes ?  ?Plan:  ?Heparin 3000 units IV bolus x1 followed by infusion at 1000 units/hr and monitor heparin levels and CBC. ? ?Wynona Neat, PharmD, BCPS  ?05/12/2021,4:19 AM ? ? ?

## 2021-05-12 NOTE — Progress Notes (Signed)
ANTICOAGULATION CONSULT NOTE ? ?Pharmacy Consult for heparin ?Indication: chest pain/ACS ? ?Allergies  ?Allergen Reactions  ? Azithromycin   ?  Other reaction(s): Unknown  ? Other Other (See Comments)  ?  SOME OF THE "MYCINS"...UNKNOWN REACTION.  ? Adhesive [Tape] Rash  ? Latex Rash  ? ? ?Patient Measurements: ?Height: 5\' 8"  (172.7 cm) ?Weight: 79.7 kg (175 lb 11.3 oz) ?IBW/kg (Calculated) : 63.9 ? ?Vital Signs: ?Temp: 97.8 ?F (36.6 ?C) (03/02 1646) ?Temp Source: Oral (03/02 1646) ?BP: 98/58 (03/02 1513) ?Pulse Rate: 71 (03/02 1422) ? ?Labs: ?Recent Labs  ?  05/11/21 ?2108 05/11/21 ?2347 05/12/21 ?0830 05/12/21 ?1039 05/12/21 ?1702  ?HGB 13.5  --   --   --   --   ?HCT 42.2  --   --   --   --   ?PLT 138*  --   --   --   --   ?HEPARINUNFRC  --   --   --   --  0.22*  ?CREATININE 0.94  --  0.77  --   --   ?TROPONINIHS 36* 124* 285* 345*  --   ? ? ? ?Estimated Creatinine Clearance: 50.8 mL/min (by C-G formula based on SCr of 0.77 mg/dL). ? ? ?Medical History: ?Past Medical History:  ?Diagnosis Date  ? Arthritis   ? Breast cancer ([place])   ? Right  ? CAD (coronary artery disease)   ? Cancer [facility])   ? thyroid  ? Chronic combined systolic and diastolic CHF (congestive heart failure) ([place])   ? Dilated aortic root ([place])   ? First degree AV block   ? GERD (gastroesophageal reflux disease)   ? GI bleed 1980  ? AFTER POLYP EXCISION  ? Heart disease, hypertensive   ? Hypercholesterolemia   ? Hypertension   ? Hypothyroidism   ? Incontinence of urine   ? Myalgia   ? Myocardial infarction [facility])   ? Obesity   ? Pain in joints   ? Retinal detachment   ? Unsteady gait   ? UTI (urinary tract infection)   ? Vision loss of left eye   ? Vomiting   ? ? ?Assessment: ?88yo female c/o CP, initial troponin mildly elevated and now rising, to begin heparin. ? ?Initial heparin level slightly subtherapeutic on 1000 units/hr ? ?Goal of Therapy:  ?Heparin level 0.3-0.7 units/ml ?Monitor platelets by anticoagulation protocol: Yes ?  ?Plan:  ?Increase  heparin gtt to 1100 units/hr ?F/u heparin level with AM labs ?F/u cards plan/LOT ? ?Bertis Ruddy, PharmD ?Clinical Pharmacist ?ED Pharmacist Phone # [phone] ?05/12/2021 5:45 PM ? ? ? ?

## 2021-05-12 NOTE — Progress Notes (Signed)
? ?Progress Note ? ?Patient Name: Kathy Murphy ?Date of Encounter: 05/12/2021 ? ?[facility] Cardiologist: Sanda Klein, MD  ? ?Subjective  ? ?Pleasant, has been under increased stress recently with scammers calling her trying to steal her money she states.  They have been threatening.  Had chest discomfort prior to going to the [facility] function. ? ?Inpatient Medications  ?  ?Scheduled Meds: ? amLODipine  2.5 mg Oral Daily  ? anastrozole  1 mg Oral Daily  ? aspirin EC  81 mg Oral Daily  ? carboxymethylcellulose  2 drop Ophthalmic Daily  ? isosorbide mononitrate  30 mg Oral Daily  ? levothyroxine  125 mcg Oral QAC breakfast  ? metoprolol succinate  100 mg Oral Daily  ? pantoprazole  40 mg Oral Daily  ? rosuvastatin  10 mg Oral [id number]  ? ?Continuous Infusions: ? heparin 1,000 Units/hr (05/12/21 0455)  ? ?PRN Meds: ?acetaminophen, nitroGLYCERIN, ondansetron (ZOFRAN) IV  ? ?Vital Signs  ?  ?Vitals:  ? 05/12/21 0430 05/12/21 0530 05/12/21 0740 05/12/21 0800  ?BP: (!) 134/91 129/80 (!) 145/81 (!) 145/98  ?Pulse: 62 69 65 71  ?Resp: 19 (!) 30 17 18   ?Temp:   97.8 ?F (36.6 ?C)   ?TempSrc:   Oral   ?SpO2: 99% 96% 99% 97%  ?Weight:      ?Height:      ? ?No intake or output data in the 24 hours ending 05/12/21 0933 ?Last 3 Weights 05/12/2021 05/10/2021 05/04/2021  ?Weight (lbs) 175 lb 11.3 oz 175 lb 12.8 oz 177 lb  ?Weight (kg) 79.7 kg 79.742 kg 80.287 kg  ?   ? ?Telemetry  ?  ?Sinus rhythm, no ventricular tachycardia- Personally Reviewed ? ?ECG  ?  ?Sinus rhythm, no significant ST segment changes- Personally Reviewed ? ?Physical Exam  ? ?GEN: No acute distress.  Pleasant, elderly ?Neck: No JVD ?Cardiac: RRR, no murmurs, rubs, or gallops.  ?Respiratory: Clear to auscultation bilaterally. ?GI: Soft, nontender, non-distended  ?MS: No edema; No deformity. ?Neuro:  Nonfocal  ?Psych: Normal affect  ? ?Labs  ?  ?High Sensitivity Troponin:   ?Recent Labs  ?Lab 05/11/21 ?2108 05/11/21 ?2347  ?TROPONINIHS 36* 124*  ?    ?Chemistry ?Recent Labs  ?Lab 05/11/21 ?2108  ?NA 140  ?K 3.9  ?CL 106  ?CO2 26  ?GLUCOSE 127*  ?BUN 27*  ?CREATININE 0.94  ?CALCIUM 9.4  ?GFRNONAA 57*  ?ANIONGAP 8  ?  ?Lipids No results for input(s): CHOL, TRIG, HDL, LABVLDL, LDLCALC, CHOLHDL in the last 168 hours.  ?Hematology ?Recent Labs  ?Lab 05/11/21 ?2108  ?WBC 7.5  ?RBC 4.38  ?HGB 13.5  ?HCT 42.2  ?MCV 96.3  ?MCH 30.8  ?MCHC 32.0  ?RDW 13.1  ?PLT 138*  ? ?Thyroid No results for input(s): TSH, FREET4 in the last 168 hours.  ?BNP ?Recent Labs  ?Lab 05/12/21 ?0830  ?BNP 181.3*  ?  ?DDimer No results for input(s): DDIMER in the last 168 hours.  ? ?Radiology  ?  ?DG Chest 1 View ? ?Result Date: 05/11/2021 ?CLINICAL DATA:  Chest pain EXAM: CHEST  1 VIEW.  Patient is rotated. COMPARISON:  Chest x-ray 12/11/2020, CT chest 09/09/2010 FINDINGS: The heart and mediastinal contours are unchanged. Aortic calcification. No focal consolidation. No pulmonary edema. No pleural effusion. No pneumothorax. No acute osseous abnormality. IMPRESSION: No active disease. Electronically Signed   By: Iven Finn M.D.   On: 05/11/2021 21:36   ? ?Cardiac Studies  ? ?Preliminary echo appears normal  EF 55 to 60% ? ?Patient Profile  ?   ?88 y.o. female with non-ST elevation myocardial infarction, known coronary disease ? ?Assessment & Plan  ?  ?Non-ST elevation myocardial infarction ?- Mildly elevated troponin.  Continue with medical management, IV heparin, aspirin, beta-blocker metoprolol succinate 100 mg a day.  We will increase her isosorbide to 60 mg a day.  Continue with high intensity statin. ?-Plan will be to continue heparin infusion for the next 24 to 48 hours. ?-Continue to monitor for any signs of adverse arrhythmia such as ventricular tachycardia. ?- Currently comfortable.  Elevated troponin does portend an increased risk of morbidity mortality over the next 30 days. ?-Platelet count 138,000.  Continue to monitor. ? ?Essential hypertension ?- Continue with amlodipine 2.5 mg  including medications as above. ? ?History of breast cancer in remission ?- Home anastrozole continued ? ?Hypothyroidism ?- Synthroid 125 mcg at home. ? ?Discussed case with her primary cardiologist Dr. Lorenza Cambridge as well.  She was very complementary of his care. ? ?CRITICAL CARE ?Performed by: Candee Furbish ? ? ?Total critical care time: 35 minutes ? ?Critical care time was exclusive of separately billable procedures and treating other patients. ? ?Critical care was necessary to treat or prevent imminent or life-threatening deterioration. ? ?Critical care was time spent personally by me on the following activities: development of treatment plan with patient and/or surrogate as well as nursing, discussions with consultants, evaluation of patient's response to treatment, examination of patient, obtaining history from patient or surrogate, ordering and performing treatments and interventions, ordering and review of laboratory studies, ordering and review of radiographic studies, pulse oximetry and re-evaluation of patient's condition.  ? ? ?For questions or updates, please contact [place] ?Please consult [email or web address] for contact info under  ? ?  ?   ?Signed, ?Candee Furbish, MD  ?05/12/2021, 9:33 AM   ? ?

## 2021-05-12 NOTE — ED Notes (Signed)
Assisted pt on [facility]. Pt requested purewick. Informed pt she will need to stay within bed to keep purewick in place. ? ?Pt spoke with a family friend who will be bringing a Games developer and toilet tissue. ?

## 2021-05-12 NOTE — ED Notes (Addendum)
Notified on-call Cardiologist Sande Rives PA about pt low BP. RN check pt BP bilaterally. Pt is asymptomatic. Callie PA recommend to do a manual BP, tilt bed back, and keep an eye on the BP at this time.  ?

## 2021-05-12 NOTE — ED Notes (Signed)
Pt provided water.  

## 2021-05-12 NOTE — ED Notes (Signed)
Assisted pt with sitting on side of bed. Educated pt to notify RN when she is ready to lay in bed. Provided pt call light ?

## 2021-05-12 NOTE — Progress Notes (Signed)
?  Echocardiogram ?2D Echocardiogram has been performed. ? ?[name] ?05/12/2021, 10:11 AM ?

## 2021-05-12 NOTE — H&P (Signed)
Cardiology Admission History and Physical:   Patient ID: [name] MRN: [id number]; DOB: Oct 28, 1927   Admission date: 05/11/2021  PCP:  Burnard Bunting, MD   [facility] Providers Cardiologist:  Sanda Klein, MD        Chief Complaint:  chest pain  Patient Profile:   Kathy Murphy is a 88 y.o. female with coroanry artery disease, heart failure with mildly reduced EF, HTN, breast cancer in reminision on long-term anastrazole who is being seen 05/12/2021 for the evaluation of chest pain.  History of Present Illness:   Ms. Kathy Murphy is a 88 year old female with past medical history as above who presented with chest pain today.  She was in her usual state of health this evening, going about getting ready for an event at her church, when she was stopped abruptly by substernal chest pain.  She was not particularly exerting herself but was a bit emotionally activated trying to get everything ready for church.  The pain felt like her former MIs.  She had some associated generalized weakness and broke out in a cold sweat.  The pain lasted for about an hour but then was given nitroglycerin and it went away.  She was last seen 2/22 by Dr. Orene Desanctis.  She has a history of diffuse CAD and underwent prior PCI of a subtotally occluded right coronary artery with medically managed CAD otherwise.   The invasive evaluation was undertaken after an MPI with depressed function 7 years ago and LVEF had subseqeuntly normalized on TTE.   Past Medical History:  Diagnosis Date   Arthritis    Breast cancer ([place])    Right   CAD (coronary artery disease)    Cancer (HCC)    thyroid   Chronic combined systolic and diastolic CHF (congestive heart failure) (HCC)    Dilated aortic root (HCC)    First degree AV block    GERD (gastroesophageal reflux disease)    GI bleed 1980   AFTER POLYP EXCISION   Heart disease, hypertensive    Hypercholesterolemia    Hypertension    Hypothyroidism    Incontinence of  urine    Myalgia    Myocardial infarction ([place])    Obesity    Pain in joints    Retinal detachment    Unsteady gait    UTI (urinary tract infection)    Vision loss of left eye    Vomiting     Past Surgical History:  Procedure Laterality Date   ABDOMINAL HYSTERECTOMY  1977   BREAST BIOPSY     BREAST LUMPECTOMY     x5   CARDIAC CATHETERIZATION N/A 02/08/2016   Procedure: Left Heart Cath and Coronary Angiography;  Surgeon: Belva Crome, MD;  Location: [place] [facility];  Service: Cardiovascular;  Laterality: N/A;   CARDIAC CATHETERIZATION N/A 02/11/2016   Procedure: Coronary Stent Intervention;  Surgeon: Belva Crome, MD;  Location: [place] [facility];  Service: Cardiovascular;  Laterality: N/A;   CATARACT EXTRACTION, BILATERAL     CERVICAL LAMINECTOMY     DENTAL SURGERY  1952   EVACUATION BREAST HEMATOMA Right 01/09/2017   Procedure: EVACUATION RIGHT MASTECTOMY FLUID COLLECTION;  Surgeon: Rolm Bookbinder, MD;  Location: [place];  Service: General;  Laterality: Right;   [place]  2012   left   TOTAL KNEE ARTHROPLASTY Right 05/18/2017   Procedure: RIGHT TOTAL KNEE  ARTHROPLASTY;  Surgeon: Netta Cedars, MD;  Location: [place];  Service: Orthopedics;  Laterality: Right;   TOTAL MASTECTOMY Right 11/27/2016   TOTAL MASTECTOMY Right 11/27/2016   Procedure: RIGHT TOTAL MASTECTOMY;  Surgeon: Rolm Bookbinder, MD;  Location: [place];  Service: General;  Laterality: Right;     Medications Prior to Admission: Prior to Admission medications   Medication Sig Start Date End Date Taking? Authorizing Provider  acetaminophen (TYLENOL) 650 MG CR tablet Take 650 mg by mouth every 8 (eight) hours as needed for pain. Patient not taking: Reported on 05/04/2021    [provider]  amLODipine (NORVASC) 2.5 MG tablet Take 1 tablet (2.5 mg total) by mouth daily. Patient not taking: Reported on 05/04/2021 12/16/20   Hosie Poisson, MD  anastrozole (ARIMIDEX) 1 MG tablet Take 1 tablet (1 mg total) by mouth daily. 05/06/21   Benay Pike, MD  aspirin EC 81 MG tablet Take 81 mg by mouth daily.    [provider]  Carboxymethylcellulose Sodium (REFRESH TEARS OP) Apply 1-2 drops to eye daily.    [provider]  Cholecalciferol (VITAMIN D3) 125 MCG (5000 UT) CAPS Take 1 capsule by mouth daily.    [provider]  diazepam (VALIUM) 2 MG tablet Take 1 tablet (2 mg total) by mouth every 6 (six) hours as needed for anxiety. 12/15/20   Hosie Poisson, MD  Diclofenac Sodium CR 100 MG 24 hr tablet Take 100 mg by mouth daily. 09/02/16   [provider]  isosorbide mononitrate (IMDUR) 30 MG 24 hr tablet Take 1 tablet (30 mg total) by mouth daily. Patient not taking: Reported on 05/04/2021 12/16/20   Hosie Poisson, MD  levothyroxine (SYNTHROID, LEVOTHROID) 125 MCG tablet Take 125 mcg by mouth daily before breakfast.    [provider]  metoprolol succinate (TOPROL-XL) 100 MG 24 hr tablet TAKE 1 TABLET DAILY Patient taking differently: Take 100 mg by mouth daily. 05/02/21   Croitoru, Mihai, MD  nitroGLYCERIN (NITROSTAT) 0.4 MG SL tablet Place 1 tablet (0.4 mg total) under the tongue every 5 (five) minutes x 3 doses as needed for chest pain. Patient not taking: Reported on 05/04/2021 12/15/20   Hosie Poisson, MD  pantoprazole (PROTONIX) 40 MG tablet Take 40 mg by mouth daily.    [provider]  rosuvastatin (CRESTOR) 10 MG tablet Take 10 mg by mouth every morning.    [provider]     Allergies:    Allergies  Allergen Reactions   Azithromycin     Other reaction(s): Unknown   Other Other (See Comments)    SOME OF THE "MYCINS"...UNKNOWN REACTION.   Adhesive [Tape] Rash   Latex Rash    Social History:   Social History   Socioeconomic History   Marital status: Widowed    Spouse name: Not on file   Number of children: Not on file   Years of education: Not on file    Highest education level: Not on file  Occupational History   Not on file  Tobacco Use   Smoking status: Never   Smokeless tobacco: Never  Vaping Use   Vaping Use: Never used  Substance and Sexual Activity   Alcohol use: No   Drug use: No   Sexual activity: Not on file  Other Topics Concern   Not on file  Social History Narrative   Not on file   Social Determinants of Health   Financial Resource Strain: Not on file  Food Insecurity: No Food  Insecurity   Worried About Charity fundraiser in the Last Year: Never true   [place] in the Last Year: Never true  Transportation Needs: No Transportation Needs   Lack of Transportation (Medical): No   Lack of Transportation (Non-Medical): No  Physical Activity: Not on file  Stress: No Stress Concern Present   Feeling of Stress : Only a little  Social Connections: Moderately Integrated   Frequency of Communication with Friends and Family: Three times a week   Frequency of Social Gatherings with Friends and Family: Three times a week   Attends Religious Services: 1 to 4 times per year   Active Member of Clubs or Organizations: Yes   Attends Archivist Meetings: 1 to 4 times per year   Marital Status: Widowed  Human resources officer Violence: Not At Risk   Fear of Current or Ex-Partner: No   Emotionally Abused: No   Physically Abused: No   Sexually Abused: No    Family History:   The patient's family history includes Heart disease in her mother.    ROS:  Please see the history of present illness.   Physical Exam/Data:   Vitals:   05/12/21 0400 05/12/21 0424 05/12/21 0430 05/12/21 0530  BP:  113/84 (!) 134/91 129/80  Pulse:  72 62 69  Resp:  (!) 21 19 (!) 30  Temp:      SpO2:  97% 99% 96%  Weight: 79.7 kg     Height: 5\' 8"  (1.727 m)      No intake or output data in the 24 hours ending 05/12/21 0655 Last 3 Weights 05/12/2021 05/10/2021 05/04/2021  Weight (lbs) 175 lb 11.3 oz 175 lb 12.8 oz 177 lb  Weight (kg)  79.7 kg 79.742 kg 80.287 kg     Body mass index is 26.72 kg/m.  General:  Well nourished, well developed, in no acute distress elderly nonagenarian  HEENT: normal Neck: no JVD Vascular: No carotid bruits; Distal pulses 2+ bilaterally   Cardiac:  normal S1, S2; RRR; no murmur Lungs:  clear to auscultation bilaterally, no wheezing, rhonchi or rales  Abd: soft, nontender, no hepatomegaly  Ext: no edema Musculoskeletal:  No deformities, BUE and BLE strength normal and equal Skin: warm and dry  Neuro:  CNs 2-12 intact, no focal abnormalities noted Psych:  Normal affect    EKG:   NSR with septal infarct pattern, stable from prior ECGs.  Prior echo Echo 12/13/20:  1. Left ventricular ejection fraction, by estimation, is 55 to 60%. The  left ventricle has normal function. The left ventricle demonstrates  regional wall motion abnormalities (see scoring diagram/findings for  description). There is moderate asymmetric  left ventricular hypertrophy of the septal segment. Left ventricular  diastolic parameters are consistent with Grade I diastolic dysfunction  (impaired relaxation).   2. Right ventricular systolic function is normal. The right ventricular  size is normal. There is moderately elevated pulmonary artery systolic  pressure. The estimated right ventricular systolic pressure is 40.9 mmHg.   3. Left atrial size was mildly dilated.   4. The mitral valve is abnormal. Mild mitral valve regurgitation.  Moderate mitral annular calcification.   5. The aortic valve is tricuspid. Aortic valve regurgitation is trivial.  Mild aortic valve sclerosis is present, with no evidence of aortic valve  stenosis. Aortic valve mean gradient measures 5.0 mmHg.   6. Aortic dilatation noted. There is mild dilatation of the aortic root,  measuring 42 mm.   7.  The inferior vena cava is normal in size with greater than 50%  respiratory variability, suggesting right atrial pressure of 3 mmHg.    Prior  cath Mid RCA chronic total occlusion collateralized from the left coronary artery. Successful overlapping stents from the distal RCA no the margin of the PDA back to the proximal RCA using two 38 mm long Synergy drug-eluting stents postdilated to 3.0 mm in diameter with 100% stenosis reduced to 0%. Grade 3 flow was noted. Angio-Seal was used for successful hemostasis.    Laboratory Data:  High Sensitivity Troponin:   Recent Labs  Lab 05/11/21 2108 05/11/21 2347  TROPONINIHS 36* 124*      Chemistry Recent Labs  Lab 05/11/21 2108  NA 140  K 3.9  CL 106  CO2 26  GLUCOSE 127*  BUN 27*  CREATININE 0.94  CALCIUM 9.4  GFRNONAA 57*  ANIONGAP 8    No results for input(s): PROT, ALBUMIN, AST, ALT, ALKPHOS, BILITOT in the last 168 hours. Lipids No results for input(s): CHOL, TRIG, HDL, LABVLDL, LDLCALC, CHOLHDL in the last 168 hours. Hematology Recent Labs  Lab 05/11/21 2108  WBC 7.5  RBC 4.38  HGB 13.5  HCT 42.2  MCV 96.3  MCH 30.8  MCHC 32.0  RDW 13.1  PLT 138*   Thyroid No results for input(s): TSH, FREET4 in the last 168 hours. BNPNo results for input(s): BNP, PROBNP in the last 168 hours.  DDimer No results for input(s): DDIMER in the last 168 hours.   Radiology/Studies:  DG Chest 1 View  Result Date: 05/11/2021 CLINICAL DATA:  Chest pain EXAM: CHEST  1 VIEW.  Patient is rotated. COMPARISON:  Chest x-ray 12/11/2020, CT chest 09/09/2010 FINDINGS: The heart and mediastinal contours are unchanged. Aortic calcification. No focal consolidation. No pulmonary edema. No pleural effusion. No pneumothorax. No acute osseous abnormality. IMPRESSION: No active disease. Electronically Signed   By: Iven Finn M.D.   On: 05/11/2021 21:36     Assessment and Plan:   17F with history of HFrEF, ischemic cardiomyopathy, diffuse obstructive CAD with prior RCA PCI who presents with chest pain and positive troponin ruling in for unstable angina/nstemi.  Currently she is chest pain  free.  Discussed with patient that given prior findings would be reasonable to consider medical management if she does not have recurrent pain and LV function is stable.  She is agreeable for now.  Plan #Chest pain #Elevated troponin - ASA - Heparin infusion - TTE - Nitroglycerin PRN for chest pain (none since presentation) - Continue home metoprolol succinate 100 mg - Increase isosorbide mononitrate to 60 mg daily - High intensity statin  #HTN - Continue above - Continue home amlodipine 2.5 mg daily  #Hypothyroidism - Home 125 mcg daily synthroid  #History of breast cancer in remission - Home anastrozole continued.   Risk Assessment/Risk Scores:    TIMI Risk Score for Unstable Angina or Non-ST Elevation MI:   The patient's TIMI risk score is 5, which indicates a 26% risk of all cause mortality, new or recurrent myocardial infarction or need for urgent revascularization in the next 14 days.       Severity of Illness: The appropriate patient status for this patient is INPATIENT. Inpatient status is judged to be reasonable and necessary in order to provide the required intensity of service to ensure the patient's safety. The patient's presenting symptoms, physical exam findings, and initial radiographic and laboratory data in the context of their chronic comorbidities is felt to place  them at high risk for further clinical deterioration. Furthermore, it is not anticipated that the patient will be medically stable for discharge from the hospital within 2 midnights of admission.   * I certify that at the point of admission it is my clinical judgment that the patient will require inpatient hospital care spanning beyond 2 midnights from the point of admission due to high intensity of service, high risk for further deterioration and high frequency of surveillance required.*   For questions or updates, please contact [place] Please consult [email or web address] for contact info under      Signed, Delight Hoh, MD  05/12/2021 6:55 AM

## 2021-05-12 NOTE — Progress Notes (Addendum)
? ?  Notified of low BP readings. Patient has had a couple of low BP reading this afternoon at 76/60 and 89/51. Patient completely asymptomatic. Patient currently on Amlodipine 2.5mg  daily, Imdur 30mg  daily, and Toprol-XL 100mg  daily all of which she had this morning. Asked RN to recheck BP manually. She does have a history of chronic diastolic CHF although stable. If BP still low on manual recheck, may need IV fluids but will hold off for right now given she is asymptomatic. ? ?Darreld Mclean, PA-C ?05/12/2021 2:31 PM ? ?Addendum: ?BP on manual check 98/58.  Continue to keep a close eye on BP. Will go ahead and stop her Amlodipine.  Depending on what her BP does overnight, we may need to adjust her Toprol XL and Imdur in the morning as well.  Will continue to hold off on fluids for now given patient is asymptomatic.  Asked RN to let us know if BP starts to drop or she becomes symptomatic. ? ?Darreld Mclean, PA-C ?05/12/2021 3:20 PM ? ? ? ? ? ?

## 2021-05-12 NOTE — ED Notes (Signed)
RN assisted pt on BSC. Pt had a small BM. RN provided pericare, new linens and brief. Pt requested comb and lipstick. RN gave pt toiletry bag.  ?

## 2021-05-12 NOTE — Plan of Care (Signed)
?  Problem: Education: ?Goal: Knowledge of General Education information will improve ?Description: Including pain rating scale, medication(s)/side effects and non-pharmacologic comfort measures ?Outcome: Progressing ?  ?Problem: Nutrition: ?Goal: Adequate nutrition will be maintained ?Outcome: Progressing ?  ?Problem: Coping: ?Goal: Level of anxiety will decrease ?Outcome: Progressing ?  ?Problem: Safety: ?Goal: Ability to remain free from injury will improve ?Outcome: Progressing ?  ?Problem: Skin Integrity: ?Goal: Risk for impaired skin integrity will decrease ?Outcome: Progressing ?  ?

## 2021-05-12 NOTE — ED Notes (Signed)
Ordered pt lunch tray 

## 2021-05-12 NOTE — ED Notes (Signed)
RN assisted pt on BSC. RN provided pericare and new brief.  ?

## 2021-05-12 NOTE — ED Notes (Signed)
Upon entering room pt was sitting at the end of bed stating she is trying to find her call light. RN showed pt call light and had pt get repositioned in bed. RN replaced BP cuff, cardiac monitoring, and cleaning IV site. RN handed pt call light to pt. ?

## 2021-05-12 NOTE — ED Notes (Signed)
Contacted Dr. Marlou Porch Cardiology to place a diet for pt.  ?

## 2021-05-12 NOTE — ED Notes (Signed)
Pt eating lunch tray at bedside with friend. ?

## 2021-05-12 NOTE — ED Provider Notes (Signed)
[facility] EMERGENCY DEPARTMENT Provider Note   CSN: [id number] Arrival date & time: 05/11/21  2059     History  Chief Complaint  Patient presents with   Chest Pain    Kathy Murphy is a 88 y.o. female.  The history is provided by the patient and a relative.  Chest Pain Pain location:  Epigastric Pain quality: tightness   Radiates to: to ss area. Pain severity:  Severe Onset quality:  Sudden Duration:  1 hour Timing:  Constant Progression:  Resolved Chronicity:  New Context comment:  Exertion, patient was rushing around for church Relieved by:  Nitroglycerin Worsened by:  Nothing Associated symptoms: diaphoresis and weakness   Associated symptoms: no back pain, no cough, no fever, no nausea, no palpitations and no vomiting   Risk factors: coronary artery disease   Patient with CAD and previous NSTEMI presents with epigastric pain that radiated up in to SS area.  Patient had associated weakness and diaphoresis.  Patient called 911 and was given nitroglycerin which relieved her pain.  She is currently pain free. Patient's cardiologist is Dr. Sallyanne Kuster    Home Medications Prior to Admission medications   Medication Sig Start Date End Date Taking? Authorizing Provider  acetaminophen (TYLENOL) 650 MG CR tablet Take 650 mg by mouth every 8 (eight) hours as needed for pain. Patient not taking: Reported on 05/04/2021    [provider]  amLODipine (NORVASC) 2.5 MG tablet Take 1 tablet (2.5 mg total) by mouth daily. Patient not taking: Reported on 05/04/2021 12/16/20   Hosie Poisson, MD  anastrozole (ARIMIDEX) 1 MG tablet Take 1 tablet (1 mg total) by mouth daily. 05/06/21   Benay Pike, MD  aspirin EC 81 MG tablet Take 81 mg by mouth daily.    [provider]  Carboxymethylcellulose Sodium (REFRESH TEARS OP) Apply 1-2 drops to eye daily.    [provider]  Cholecalciferol (VITAMIN D3) 125 MCG (5000 UT) CAPS Take 1 capsule by mouth  daily.    [provider]  diazepam (VALIUM) 2 MG tablet Take 1 tablet (2 mg total) by mouth every 6 (six) hours as needed for anxiety. 12/15/20   Hosie Poisson, MD  Diclofenac Sodium CR 100 MG 24 hr tablet Take 100 mg by mouth daily. 09/02/16   [provider]  isosorbide mononitrate (IMDUR) 30 MG 24 hr tablet Take 1 tablet (30 mg total) by mouth daily. Patient not taking: Reported on 05/04/2021 12/16/20   Hosie Poisson, MD  levothyroxine (SYNTHROID, LEVOTHROID) 125 MCG tablet Take 125 mcg by mouth daily before breakfast.    [provider]  metoprolol succinate (TOPROL-XL) 100 MG 24 hr tablet TAKE 1 TABLET DAILY Patient taking differently: Take 100 mg by mouth daily. 05/02/21   Croitoru, Mihai, MD  nitroGLYCERIN (NITROSTAT) 0.4 MG SL tablet Place 1 tablet (0.4 mg total) under the tongue every 5 (five) minutes x 3 doses as needed for chest pain. Patient not taking: Reported on 05/04/2021 12/15/20   Hosie Poisson, MD  pantoprazole (PROTONIX) 40 MG tablet Take 40 mg by mouth daily.    [provider]  rosuvastatin (CRESTOR) 10 MG tablet Take 10 mg by mouth every morning.    [provider]      Allergies    Azithromycin, Other, Adhesive [tape], and Latex    Review of Systems   Review of Systems  Constitutional:  Positive for diaphoresis. Negative for fever.  HENT:  Negative for facial swelling.   Eyes:  Negative for redness.  Respiratory:  Negative for cough.   Cardiovascular:  Positive for chest pain. Negative for palpitations.  Gastrointestinal:  Negative for nausea and vomiting.  Musculoskeletal:  Negative for back pain and neck pain.  Skin:  Negative for rash.  Neurological:  Positive for weakness.  All other systems reviewed and are negative.  Physical Exam Updated Vital Signs BP (!) 179/81    Pulse 62    Temp 98.6 F (37 C)    Resp 14    SpO2 99%  Physical Exam Vitals and nursing note reviewed.  Constitutional:      Appearance: Normal  appearance. She is not diaphoretic.  HENT:     Head: Normocephalic and atraumatic.     Nose: Nose normal.  Eyes:     Conjunctiva/sclera: Conjunctivae normal.     Pupils: Pupils are equal, round, and reactive to light.  Cardiovascular:     Rate and Rhythm: Normal rate and regular rhythm.     Pulses: Normal pulses.     Heart sounds: Normal heart sounds.  Pulmonary:     Effort: Pulmonary effort is normal.     Breath sounds: Normal breath sounds.  Abdominal:     General: Bowel sounds are normal.     Palpations: Abdomen is soft.     Tenderness: There is no abdominal tenderness. There is no guarding.  Musculoskeletal:        General: Normal range of motion.     Cervical back: Normal range of motion and neck supple.  Skin:    General: Skin is warm and dry.     Capillary Refill: Capillary refill takes less than 2 seconds.  Neurological:     General: No focal deficit present.     Mental Status: She is alert and oriented to person, place, and time.     Deep Tendon Reflexes: Reflexes normal.  Psychiatric:        Mood and Affect: Mood normal.        Behavior: Behavior normal.    ED Results / Procedures / Treatments   Labs (all labs ordered are listed, but only abnormal results are displayed) Results for orders placed or performed during the hospital encounter of 56/31/49  Basic metabolic panel  Result Value Ref Range   Sodium 140 135 - 145 mmol/L   Potassium 3.9 3.5 - 5.1 mmol/L   Chloride 106 98 - 111 mmol/L   CO2 26 22 - 32 mmol/L   Glucose, Bld 127 (H) 70 - 99 mg/dL   BUN 27 (H) 8 - 23 mg/dL   Creatinine, Ser 0.94 0.44 - 1.00 mg/dL   Calcium 9.4 8.9 - 10.3 mg/dL   GFR, Estimated 57 (L) >60 mL/min   Anion gap 8 5 - 15  CBC  Result Value Ref Range   WBC 7.5 4.0 - 10.5 K/uL   RBC 4.38 3.87 - 5.11 MIL/uL   Hemoglobin 13.5 12.0 - 15.0 g/dL   HCT 42.2 36.0 - 46.0 %   MCV 96.3 80.0 - 100.0 fL   MCH 30.8 26.0 - 34.0 pg   MCHC 32.0 30.0 - 36.0 g/dL   RDW 13.1 11.5 - 15.5 %    Platelets 138 (L) 150 - 400 K/uL   nRBC 0.0 0.0 - 0.2 %  Troponin I (High Sensitivity)  Result Value Ref Range   Troponin I (High Sensitivity) 36 (H) <18 ng/L  Troponin I (High Sensitivity)  Result Value Ref Range   Troponin I (High Sensitivity) 124 (  HH) <18 ng/L   DG Chest 1 View  Result Date: 05/11/2021 CLINICAL DATA:  Chest pain EXAM: CHEST  1 VIEW.  Patient is rotated. COMPARISON:  Chest x-ray 12/11/2020, CT chest 09/09/2010 FINDINGS: The heart and mediastinal contours are unchanged. Aortic calcification. No focal consolidation. No pulmonary edema. No pleural effusion. No pneumothorax. No acute osseous abnormality. IMPRESSION: No active disease. Electronically Signed   By: Iven Finn M.D.   On: 05/11/2021 21:36    EKG EKG Interpretation  Date/Time:  Thursday May 12 2021 01:56:17 EST Ventricular Rate:  67 PR Interval:  244 QRS Duration: 110 QT Interval:  436 QTC Calculation: 460 R Axis:   -17 Text Interpretation: Sinus rhythm with 1st degree A-V block Septal infarct , age undetermined Confirmed by Dory Horn) on 05/12/2021 3:36:36 AM  Radiology DG Chest 1 View  Result Date: 05/11/2021 CLINICAL DATA:  Chest pain EXAM: CHEST  1 VIEW.  Patient is rotated. COMPARISON:  Chest x-ray 12/11/2020, CT chest 09/09/2010 FINDINGS: The heart and mediastinal contours are unchanged. Aortic calcification. No focal consolidation. No pulmonary edema. No pleural effusion. No pneumothorax. No acute osseous abnormality. IMPRESSION: No active disease. Electronically Signed   By: Iven Finn M.D.   On: 05/11/2021 21:36    Procedures Procedures    Medications Ordered in ED Medications  heparin ADULT infusion 100 units/mL (25000 units/242mL) (has no administration in time range)    ED Course/ Medical Decision Making/ A&P                           Medical Decision Making Patient with epigastric in SSCP with weakness and diaphoresis.    Problems Addressed: NSTEMI (non-ST  elevated myocardial infarction) [facility]): acute illness or injury    Details: admit to cardiology on heparin drip  Amount and/or Complexity of Data Reviewed Independent Historian:     Details: relative see above External Data Reviewed: notes.    Details: previous notes reviewed in epic Labs: ordered.    Details: all labs reviewed by me: white count is normal at 7.5 normal hemoglobin at 13.5 first troponin is elevated at 36 and second is increased to 124 chemistry panel is normal Discussion of management or test interpretation with external provider(s): Case d/w Dr. Blossom Hoops of cardiology who will admit the patient   Risk Prescription drug management. Drug therapy requiring intensive monitoring for toxicity. Decision regarding hospitalization. Risk Details: Patient had a positive delta troponin.  Her first was elvated at 56 but second was more than triple the first.  She has been started on heparin and nitro paste in the ED and will be admitted to cardiology   Critical Care Total time providing critical care: 30-74 minutes  CRITICAL CARE Performed by: Dabney Dever K Satchel Heidinger-Rasch Total critical care time: 31 minutes Critical care time was exclusive of separately billable procedures and treating other patients. Critical care was necessary to treat or prevent imminent or life-threatening deterioration. Critical care was time spent personally by me on the following activities: development of treatment plan with patient and/or surrogate as well as nursing, discussions with consultants, evaluation of patient's response to treatment, examination of patient, obtaining history from patient or surrogate, ordering and performing treatments and interventions, ordering and review of laboratory studies, ordering and review of radiographic studies, pulse oximetry and re-evaluation of patient's condition.   Final Clinical Impression(s) / ED Diagnoses Final diagnoses:  Chest pain  NSTEMI (non-ST elevated myocardial  infarction) ([place])   The  patient appears reasonably stabilized for admission considering the current resources, flow, and capabilities available in the ED at this time, and I doubt any other [facility] requiring further screening and/or treatment in the ED prior to admission.      Ceairra Mccarver, MD 05/12/21 [phone]

## 2021-05-13 ENCOUNTER — Other Ambulatory Visit: Payer: Self-pay | Admitting: *Deleted

## 2021-05-13 DIAGNOSIS — I5020 Unspecified systolic (congestive) heart failure: Secondary | ICD-10-CM

## 2021-05-13 LAB — CBC
HCT: 39 % (ref 36.0–46.0)
Hemoglobin: 12.5 g/dL (ref 12.0–15.0)
MCH: 30.3 pg (ref 26.0–34.0)
MCHC: 32.1 g/dL (ref 30.0–36.0)
MCV: 94.7 fL (ref 80.0–100.0)
Platelets: 127 10*3/uL — ABNORMAL LOW (ref 150–400)
RBC: 4.12 MIL/uL (ref 3.87–5.11)
RDW: 13.2 % (ref 11.5–15.5)
WBC: 5.9 10*3/uL (ref 4.0–10.5)
nRBC: 0 % (ref 0.0–0.2)

## 2021-05-13 LAB — BASIC METABOLIC PANEL
Anion gap: 8 (ref 5–15)
BUN: 24 mg/dL — ABNORMAL HIGH (ref 8–23)
CO2: 25 mmol/L (ref 22–32)
Calcium: 9 mg/dL (ref 8.9–10.3)
Chloride: 102 mmol/L (ref 98–111)
Creatinine, Ser: 0.99 mg/dL (ref 0.44–1.00)
GFR, Estimated: 54 mL/min — ABNORMAL LOW (ref >60)
Glucose, Bld: 102 mg/dL — ABNORMAL HIGH (ref 70–99)
Potassium: 3.4 mmol/L — ABNORMAL LOW (ref 3.5–5.1)
Sodium: 135 mmol/L (ref 135–145)

## 2021-05-13 LAB — HEPARIN LEVEL (UNFRACTIONATED)
Heparin Unfractionated: 0.3 IU/mL (ref 0.30–0.70)
Heparin Unfractionated: 0.41 IU/mL (ref 0.30–0.70)

## 2021-05-13 NOTE — TOC Transition Note (Signed)
Transition of Care (TOC) - CM/SW Discharge Note ? ? ?Patient Details  ?Name: Kathy Murphy ?MRN: [id number] ?Date of Birth: 11/17/1927 ? ?Transition of Care (TOC) CM/SW Contact:  ?Angelita Ingles, RN ?Phone Number:[phone] ? ?05/13/2021, 12:58 PM ? ? ?Clinical Narrative:    ?Patient discharging no TOC needs.  ? ? ?  ?  ? ? ?Patient Goals and CMS Choice ?  ?  ?  ? ?Discharge Placement ?  ?           ?  ?  ?  ?  ? ?Discharge Plan and Services ?  ?  ?           ?  ?  ?  ?  ?  ?  ?  ?  ?  ?  ? ?Social Determinants of Health (SDOH) Interventions ?  ? ? ?Readmission Risk Interventions ?No flowsheet data found. ? ? ? ? ?

## 2021-05-13 NOTE — Patient Outreach (Signed)
[facility]) Care Management ? ?05/13/2021 ? ?Kathy Murphy ?1927-07-03 ?[id number] ? ?Nurse Health Coach open chart to make an initial patient outreach call. When reviewing the chart discovered that the patient was admitted to the hospital on 05/11/21 due to having a NSTEMI.  ?Nurse Health Coach will perform case closure and transfer patient to the [place] Team. Nurse has informed [facility] Coordinator of patient's admission.  ?  ?Plan: ?RN Health Coach Discipline Closure ? ?Emelia Loron RN, BSN ?Nurse Health Coach ?[place] ?[phone] ?[name] ? ? ?

## 2021-05-13 NOTE — Discharge Instructions (Addendum)
Medication Changes: ?- STOP Amlodipine. ?- STOP Diclofenac Sodium. This is a NSAID and we prefer patients with heart disease avoid NSAIDs. Recommend Tylenol as needed for pain instead. ?- Continue all other medications as directed elsewhere on discharge paperwork. ?

## 2021-05-13 NOTE — Progress Notes (Signed)
ANTICOAGULATION CONSULT NOTE ? ?Pharmacy Consult for heparin ?Indication: chest pain/ACS ? ?Allergies  ?Allergen Reactions  ? Azithromycin   ?  Other reaction(s): Unknown  ? Other Other (See Comments)  ?  SOME OF THE "MYCINS"...UNKNOWN REACTION.  ? Adhesive [Tape] Rash  ? Latex Rash  ? ? ?Patient Measurements: ?Height: 5\' 8"  (172.7 cm) ?Weight: 78.2 kg (172 lb 6.4 oz) ?IBW/kg (Calculated) : 63.9 ? ?Vital Signs: ?Temp: 98.1 ?F (36.7 ?C) (03/03 0805) ?Temp Source: Oral (03/03 0805) ?BP: 106/73 (03/03 0805) ?Pulse Rate: 78 (03/03 0805) ? ?Labs: ?Recent Labs  ?  05/11/21 ?2108 05/11/21 ?2347 05/12/21 ?0830 05/12/21 ?1039 05/12/21 ?1702 05/13/21 ?0071 05/13/21 ?1023  ?HGB 13.5  --   --   --   --  12.5  --   ?HCT 42.2  --   --   --   --  39.0  --   ?PLT 138*  --   --   --   --  127*  --   ?HEPARINUNFRC  --   --   --   --  0.22* 0.30 0.41  ?CREATININE 0.94  --  0.77  --   --  0.99  --   ?TROPONINIHS 36* 124* 285* 345*  --   --   --   ? ? ? ?Estimated Creatinine Clearance: 40.7 mL/min (by C-G formula based on SCr of 0.99 mg/dL). ? ? ?Medical History: ?Past Medical History:  ?Diagnosis Date  ? Arthritis   ? Breast cancer ([place])   ? Right  ? CAD (coronary artery disease)   ? Cancer [facility])   ? thyroid  ? Chronic combined systolic and diastolic CHF (congestive heart failure) ([place])   ? Dilated aortic root ([place])   ? First degree AV block   ? GERD (gastroesophageal reflux disease)   ? GI bleed 1980  ? AFTER POLYP EXCISION  ? Heart disease, hypertensive   ? Hypercholesterolemia   ? Hypertension   ? Hypothyroidism   ? Incontinence of urine   ? Myalgia   ? Myocardial infarction [facility])   ? Obesity   ? Pain in joints   ? Retinal detachment   ? Unsteady gait   ? UTI (urinary tract infection)   ? Vision loss of left eye   ? Vomiting   ? ? ?Assessment: ?88yo female c/o CP, initial troponin mildly elevated and now rising, to begin heparin. ? ?Heparin level 0.41 (on 1150 units/hr) ?No signs/symptoms of bleeding ? ?Goal of Therapy:  ?Heparin  level 0.3-0.7 units/ml ?Monitor platelets by anticoagulation protocol: Yes ?  ?Plan:  ?Continue heparin gtt 1150 units/hr  ?F/u heparin level with AM labs  ?Planning for 48 hours of heparin drip currently  ? ?Thank you for allowing pharmacy to be a part of this patient?s care. ? ?Donnald Garre, PharmD ?Clinical Pharmacist ? ?Please check AMION for all [place] numbers ?After 10:00 PM, call [place] [phone] ? ? ?

## 2021-05-13 NOTE — TOC Progression Note (Signed)
Transition of Care (TOC) - Progression Note  ? ? ?Patient Details  ?Name: CHEMIKA NIGHTENGALE ?MRN: [id number] ?Date of Birth: [birth year] ? ?Transition of Care (TOC) CM/SW Contact  ?Angelita Ingles, RN ?Phone [phone] ? ?05/13/2021, 8:11 AM ? ?Clinical Narrative:    ? ?Transition of Care (TOC) Screening Note ? ? ?Patient Details  ?Name: KIMBERLYNN LUMBRA ?Date of Birth: 1927-12-29 ? ? ?Transition of Care (TOC) CM/SW Contact:    ?Angelita Ingles, RN ?Phone Number: ?05/13/2021, 8:11 AM ? ? ? ?Transition of Care Department [facility]) has reviewed patient and no TOC needs have been identified at this time. We will continue to monitor patient advancement through interdisciplinary progression rounds. If new patient transition needs arise, please place a TOC consult. ? ? ? ? ?  ?  ? ?Expected Discharge Plan and Services ?  ?  ?  ?  ?  ?                ?  ?  ?  ?  ?  ?  ?  ?  ?  ?  ? ? ?Social Determinants of Health (SDOH) Interventions ?  ? ?Readmission Risk Interventions ?No flowsheet data found. ? ?

## 2021-05-13 NOTE — Progress Notes (Signed)
ANTICOAGULATION CONSULT NOTE ? ?Pharmacy Consult for heparin ?Indication: chest pain/ACS ? ?Allergies  ?Allergen Reactions  ? Azithromycin   ?  Other reaction(s): Unknown  ? Other Other (See Comments)  ?  SOME OF THE "MYCINS"...UNKNOWN REACTION.  ? Adhesive [Tape] Rash  ? Latex Rash  ? ? ?Patient Measurements: ?Height: 5\' 8"  (172.7 cm) ?Weight: 79.7 kg (175 lb 11.3 oz) ?IBW/kg (Calculated) : 63.9 ? ?Vital Signs: ?Temp: 98.5 ?F (36.9 ?C) (03/02 2340) ?Temp Source: Oral (03/02 2340) ?BP: 123/61 (03/02 2340) ?Pulse Rate: 69 (03/02 2340) ? ?Labs: ?Recent Labs  ?  05/11/21 ?2108 05/11/21 ?2347 05/12/21 ?0830 05/12/21 ?1039 05/12/21 ?1702 05/13/21 ?1749  ?HGB 13.5  --   --   --   --  12.5  ?HCT 42.2  --   --   --   --  39.0  ?PLT 138*  --   --   --   --  127*  ?HEPARINUNFRC  --   --   --   --  0.22* 0.30  ?CREATININE 0.94  --  0.77  --   --   --   ?TROPONINIHS 36* 124* 285* 345*  --   --   ? ? ? ?Estimated Creatinine Clearance: 50.8 mL/min (by C-G formula based on SCr of 0.77 mg/dL). ? ? ?Medical History: ?Past Medical History:  ?Diagnosis Date  ? Arthritis   ? Breast cancer ([place])   ? Right  ? CAD (coronary artery disease)   ? Cancer [facility])   ? thyroid  ? Chronic combined systolic and diastolic CHF (congestive heart failure) ([place])   ? Dilated aortic root ([place])   ? First degree AV block   ? GERD (gastroesophageal reflux disease)   ? GI bleed 1980  ? AFTER POLYP EXCISION  ? Heart disease, hypertensive   ? Hypercholesterolemia   ? Hypertension   ? Hypothyroidism   ? Incontinence of urine   ? Myalgia   ? Myocardial infarction [facility])   ? Obesity   ? Pain in joints   ? Retinal detachment   ? Unsteady gait   ? UTI (urinary tract infection)   ? Vision loss of left eye   ? Vomiting   ? ? ?Assessment: ?88yo female c/o CP, initial troponin mildly elevated and now rising, to begin heparin. ? ?Follow up heparin level now at low end of goal on 1100 units/hr. No bleeding or IV issues noted.  ? ?Goal of Therapy:  ?Heparin level 0.3-0.7  units/ml ?Monitor platelets by anticoagulation protocol: Yes ?  ?Plan:  ?Increase heparin gtt to 1150 units/hr to keep within range ?F/u confirmatory heparin level in 8 hours ?F/u cards plan/LOT ? ?Erin Hearing PharmD., BCPS ?Clinical Pharmacist ?05/13/2021 2:09 AM ? ?

## 2021-05-13 NOTE — Consult Note (Signed)
[facility] CM Inpatient Consult ? ? ?05/13/2021 ? ?Louellen Molder ?09-Aug-1927 ?[id number] ? ?[place] Management [facility] CM) ?  ?Patient was active with [facility] for chronic disease management and education. Spoke with patient at bedside. Patient stated that Sharee Pimple [facility] CM health coach, was scheduled to call her today for follow-up. Explained that an RN case manager would provide a post hospital follow up assessment call. Patient denies needs for meals or transport at this time.  ? ?Plan: Referral to [place] care coordinator to be placed for post hospital follow up. ? ?Of note, [facility] Care Management services does not replace or interfere with any services that are arranged by inpatient case management or social work.  ? ?Netta Cedars, MSN, RN ?[facility] Liaison ?Mobile Phone [phone]  ?Toll free office [phone] ?

## 2021-05-13 NOTE — Discharge Summary (Addendum)
Discharge Summary    Patient ID: Kathy Murphy MRN: [id number]; DOB: September 07, 1927  Admit date: 05/11/2021 Discharge date: 05/13/2021  PCP:  Burnard Bunting, MD   [facility] Providers Cardiologist:  Sanda Klein, MD   Discharge Diagnoses    Principal Problem:   NSTEMI (non-ST elevated myocardial infarction) [facility]) Active Problems:   CAD (coronary artery disease)   HFrEF (heart failure with reduced ejection fraction) ([place])   Hyperlipidemia   Hypothyroidism   Essential hypertension   Thrombocytopenia ([place])   Diagnostic Studies/Procedures    Echocardiogram 05/12/2021: Impressions: 1. Left ventricular ejection fraction, by estimation, is 50 to 55%. Left  ventricular ejection fraction by 2D MOD biplane is 50.1 %. The left  ventricle has low normal function. The left ventricle demonstrates  regional wall motion abnormalities (see  scoring diagram/findings for description). There is mild left ventricular  hypertrophy. Left ventricular diastolic parameters are consistent with  Grade I diastolic dysfunction (impaired relaxation). There is moderate  hypokinesis of the left ventricular,  basal inferoseptal wall and inferior wall. The average left ventricular  global longitudinal strain is -18.7 %. The global longitudinal strain is  mildly abnormal with predominant basal and inferoseptal strain  abnormalities.   2. Right ventricular systolic function is normal. The right ventricular  size is normal. There is normal pulmonary artery systolic pressure. The  estimated right ventricular systolic pressure is 85.8 mmHg.   3. Left atrial size was mildly dilated.   4. The mitral valve is grossly normal. Trivial mitral valve  regurgitation.   5. The non-coronary cusp appears fixed. The aortic valve is abnormal.  There is mild calcification of the aortic valve. Aortic valve  regurgitation is not visualized.   6. Aortic dilatation noted. There is mild dilatation of the aortic root,   measuring 42 mm. There is borderline dilatation of the ascending aorta,  measuring 38 mm.   7. The inferior vena cava is normal in size with greater than 50%  respiratory variability, suggesting right atrial pressure of 3 mmHg.   8. Cannot exclude a small PFO.   Comparison(s): Changes from prior study are noted. 12/12/2020: LVEF 55-60%,  RVSP 48 mmHg.  _____________   History of Present Illness     Kathy Murphy is a 88 y.o. female with a history of diffuse CAD s/p PCI with 2 overlapping DES in 2017, chronic CHF with mildly reduced EF, hypertension, hyperlipidemia, GERD, and breast cancer in remission on long-term Anastrazole who was admitted on 05/11/2021 with NSTEMI.  Patient reported being in her usual state of health until evening of presentation when she was getting ready for church stopped abruptly by substernal chest pain.  She was not particularly exerting herself but was a bit "emotionally activated (trying to get everything ready for church.  The pain felt like the same pain she had at the time of her MI.  She had some associated generalized weakness and broke out in a cold sweat.  The pain lasted for about an hour.  She was given Nitroglycerin and it went away.  In the ED, EKG showed no acute ischemic changes. High-sensitivity troponin elevated at 36 >> 124 >> 285 >> 345. Otherwise, labs unremarkable. She was started on IV Heparin and admitted for NSTEMI.  Hospital Course     Consultants: None  NSTEMI Patient admitted with NSTEMI as stated above.  High-sensitivity troponin peaked at 345.  Echo showed LVEF 50-55% with moderate hypokinesis of the basal inferior septal wall and inferior walls.  As  well as grade 1 diastolic dysfunction.  RV normal and no significant valvular disease.  Given prior cath findings and advanced age, decision was made to treat medically.  She was treated with IV Heparin for 48 hours.  She was continued on home aspirin 81 mg daily, Toprol-XL 100 mg daily,  Imdur 30 mg daily, and Crestor 10 mg daily.  Home amlodipine was stopped due to transient hypotension.  We may be able to restart this at outpatient follow-up.  Will have patient be seen by Cardiac Rehab and if she is able to ambulate with them without any problems, she will be okay for discharge.  Chronic CHF with Mildly Reduced EF History of mildly reduced EF of 40-45% in the past.  Echo this admission showed LVEF of 50-55%.  Stable as admission with no signs of volume overload.  No diuretics needed at this time.  We will continue Toprol and Imdur as above.  Hypertension BP mostly well controlled She had one transient episode of hypotension with systolic BP dropping to 76. However, BP improved on its own and is now stable. Home Amlodipine held due to this transient hypotensive episode but may be able to restart this at follow-up visit. Will continue Toprol-XL and Imdur as above.  Hyperlipidemia Lipid panel in 12/2020: Total Cholesterol 82, Triglycerides 31, HDL 40, LDL 36. No lipid panel checked this admission. Will continue Crestor 10mg  daily for now given advanced age and LDL level at last check. Will recheck lipid panel at follow-up visit. If LDL has increased, can increase to 20mg  daily.  Hypothyroidism Continue home Synthroid.   Thrombocytopenia Patient has a history of chronic thrombocytopenia. Platelets stable at 127,000 on day of discharge. Follow-up with PCP.  History of Breast Cancer In remission and on long-term Anastrozole.  Patient seen and examined by Dr. Marlou Porch and determined to be stable for discharge. Outpatient follow-up arranged. Medications as below.  Did the patient have an acute coronary syndrome (MI, NSTEMI, STEMI, etc) this admission?:  Yes                               AHA/ACC Clinical Performance & Quality Measures: Aspirin prescribed? - Yes ADP Receptor Inhibitor (Plavix/Clopidogrel, Brilinta/Ticagrelor or Effient/Prasugrel) prescribed (includes medically  managed patients)? - No - given advanced age and no PCI (troponin also only mildly elevated) Beta Blocker prescribed? - Yes High Intensity Statin (Lipitor 40-80mg  or Crestor 20-40mg ) prescribed? - No - on Crestor 10mg  daily and LDL 36 with this EF assessed during THIS hospitalization? - Yes For EF <40%, was ACEI/ARB prescribed? - Not Applicable (EF >/= 99%) For EF <40%, Aldosterone Antagonist (Spironolactone or Eplerenone) prescribed? - Not Applicable (EF >/= 37%) Cardiac Rehab Phase II ordered (including medically managed patients)? - Yes  _____________  Discharge Vitals Blood pressure 123/69, pulse (!) 59, temperature 98.4 F (36.9 C), temperature source Oral, resp. rate 13, height 5\' 8"  (1.727 m), weight 78.2 kg, SpO2 96 %.  Filed Weights   05/12/21 0400 05/13/21 0327  Weight: 79.7 kg 78.2 kg    Labs & Radiologic Studies    CBC Recent Labs    05/11/21 2108 05/13/21 0058  WBC 7.5 5.9  HGB 13.5 12.5  HCT 42.2 39.0  MCV 96.3 94.7  PLT 138* 169*   Basic Metabolic Panel Recent Labs    05/12/21 0830 05/13/21 0058  NA 140 135  K 3.5 3.4*  CL 105 102  CO2 26 25  GLUCOSE 108* 102*  BUN 18 24*  CREATININE 0.77 0.99  CALCIUM 9.2 9.0   Liver Function Tests No results for input(s): AST, ALT, ALKPHOS, BILITOT, PROT, ALBUMIN in the last 72 hours. No results for input(s): LIPASE, AMYLASE in the last 72 hours. High Sensitivity Troponin:   Recent Labs  Lab 05/11/21 2108 05/11/21 2347 05/12/21 0830 05/12/21 1039  TROPONINIHS 36* 124* 285* 345*    BNP Invalid input(s): POCBNP D-Dimer No results for input(s): DDIMER in the last 72 hours. Hemoglobin A1C Recent Labs    05/12/21 0830  HGBA1C 5.1   Fasting Lipid Panel No results for input(s): CHOL, HDL, LDLCALC, TRIG, CHOLHDL, LDLDIRECT in the last 72 hours. Thyroid Function Tests Recent Labs    05/12/21 0830  TSH 0.219*   _____________  DG Chest 1 View  Result Date: 05/11/2021 CLINICAL DATA:  Chest pain EXAM:  CHEST  1 VIEW.  Patient is rotated. COMPARISON:  Chest x-ray 12/11/2020, CT chest 09/09/2010 FINDINGS: The heart and mediastinal contours are unchanged. Aortic calcification. No focal consolidation. No pulmonary edema. No pleural effusion. No pneumothorax. No acute osseous abnormality. IMPRESSION: No active disease. Electronically Signed   By: Iven Finn M.D.   On: 05/11/2021 21:36   ECHOCARDIOGRAM COMPLETE  Result Date: 05/12/2021    ECHOCARDIOGRAM REPORT   Patient Name:   Kathy Murphy [facility] Date of Exam: 05/12/2021 Medical Rec #:  [id number]      Height:       68.0 in Accession #:    [id number]     Weight:       175.7 lb Date of Birth:  [birth year]      BSA:          1.934 m Patient Age:    55 years       BP:           145/81 mmHg Patient Gender: F              HR:           64 bpm. Exam Location:  Inpatient Procedure: 2D Echo, Cardiac Doppler and Color Doppler Indications:    Acute ischemic heart disease, unspecified I24.9  History:        Patient has prior history of Echocardiogram examinations, most                 recent 12/12/2020. CHF, Previous Myocardial Infarction and CAD;                 Risk Factors:Hypertension.  Sonographer:    Bernadene Person RDCS Referring Phys: [id number] [place]  1. Left ventricular ejection fraction, by estimation, is 50 to 55%. Left ventricular ejection fraction by 2D MOD biplane is 50.1 %. The left ventricle has low normal function. The left ventricle demonstrates regional wall motion abnormalities (see scoring diagram/findings for description). There is mild left ventricular hypertrophy. Left ventricular diastolic parameters are consistent with Grade I diastolic dysfunction (impaired relaxation). There is moderate hypokinesis of the left ventricular, basal inferoseptal wall and inferior wall. The average left ventricular global longitudinal strain is -18.7 %. The global longitudinal strain is mildly abnormal with predominant basal and inferoseptal strain  abnormalities.  2. Right ventricular systolic function is normal. The right ventricular size is normal. There is normal pulmonary artery systolic pressure. The estimated right ventricular systolic pressure is 65.0 mmHg.  3. Left atrial size was mildly dilated.  4. The mitral valve is grossly normal. Trivial mitral valve regurgitation.  5.  The non-coronary cusp appears fixed. The aortic valve is abnormal. There is mild calcification of the aortic valve. Aortic valve regurgitation is not visualized.  6. Aortic dilatation noted. There is mild dilatation of the aortic root, measuring 42 mm. There is borderline dilatation of the ascending aorta, measuring 38 mm.  7. The inferior vena cava is normal in size with greater than 50% respiratory variability, suggesting right atrial pressure of 3 mmHg.  8. Cannot exclude a small PFO. Comparison(s): Changes from prior study are noted. 12/12/2020: LVEF 55-60%, RVSP 48 mmHg. FINDINGS  Left Ventricle: Left ventricular ejection fraction, by estimation, is 50 to 55%. Left ventricular ejection fraction by 2D MOD biplane is 50.1 %. The left ventricle has low normal function. The left ventricle demonstrates regional wall motion abnormalities. Moderate hypokinesis of the left ventricular, basal inferoseptal wall and inferior wall. The average left ventricular global longitudinal strain is -18.7 %. The global longitudinal strain is abnormal. The left ventricular internal cavity size was normal in size. There is mild left ventricular hypertrophy. Left ventricular diastolic parameters are consistent with Grade I diastolic dysfunction (impaired relaxation). Normal left ventricular filling pressure. Right Ventricle: The right ventricular size is normal. No increase in right ventricular wall thickness. Right ventricular systolic function is normal. There is normal pulmonary artery systolic pressure. The tricuspid regurgitant velocity is 2.57 m/s, and  with an assumed right atrial pressure of 3  mmHg, the estimated right ventricular systolic pressure is 75.8 mmHg. Left Atrium: Left atrial size was mildly dilated. Right Atrium: Right atrial size was normal in size. Pericardium: There is no evidence of pericardial effusion. Mitral Valve: The mitral valve is grossly normal. Trivial mitral valve regurgitation. Tricuspid Valve: The tricuspid valve is grossly normal. Tricuspid valve regurgitation is mild. Aortic Valve: The non-coronary cusp appears fixed. The aortic valve is abnormal. There is mild calcification of the aortic valve. Aortic valve regurgitation is not visualized. Pulmonic Valve: The pulmonic valve was grossly normal. Pulmonic valve regurgitation is trivial. Aorta: Aortic dilatation noted. There is mild dilatation of the aortic root, measuring 42 mm. There is borderline dilatation of the ascending aorta, measuring 38 mm. Venous: The inferior vena cava is normal in size with greater than 50% respiratory variability, suggesting right atrial pressure of 3 mmHg. IAS/Shunts: The interatrial septum is aneurysmal. Cannot exclude a small PFO.  LEFT VENTRICLE PLAX 2D                        Biplane EF (MOD) LVIDd:         5.20 cm         LV Biplane EF:   Left LVIDs:         3.60 cm                          ventricular LV PW:         1.20 cm                          ejection LV IVS:        1.10 cm                          fraction by LVOT diam:     2.10 cm  2D MOD LV SV:         56                               biplane is LV SV Index:   29                               50.1 %. LVOT Area:     3.46 cm                                Diastology                                LV e' medial:  3.00 cm/s LV Volumes (MOD)               LV e' lateral: 5.03 cm/s LV vol d, MOD    118.0 ml A2C:                           2D LV vol d, MOD    145.0 ml      Longitudinal A4C:                           Strain LV vol s, MOD    55.5 ml       2D Strain GLS  -17.2 % A2C:                           (A2C): LV  vol s, MOD    73.4 ml       2D Strain GLS  -18.7 % A4C:                           (A3C): LV SV MOD A2C:   62.5 ml       2D Strain GLS  -20.1 % LV SV MOD A4C:   145.0 ml      (A4C): LV SV MOD BP:    65.6 ml       2D Strain GLS  -18.7 %                                Avg: RIGHT VENTRICLE RV S prime:     14.90 cm/s TAPSE (M-mode): 2.4 cm LEFT ATRIUM             Index        RIGHT ATRIUM           Index LA diam:        3.30 cm 1.71 cm/m   RA Area:     14.00 cm LA Vol (A2C):   67.6 ml 34.95 ml/m  RA Volume:   33.20 ml  17.16 ml/m LA Vol (A4C):   66.8 ml 34.53 ml/m LA Biplane Vol: 68.0 ml 35.15 ml/m  AORTIC VALVE             PULMONIC VALVE LVOT Vmax:   71.50 cm/s  PR End Diast Vel: 2.84 msec LVOT Vmean:  50.300 cm/s LVOT VTI:    0.161 m  AORTA  Ao Root diam: 4.30 cm Ao Asc diam:  3.80 cm TRICUSPID VALVE TR Peak grad:   26.4 mmHg TR Vmax:        257.00 cm/s  SHUNTS Systemic VTI:  0.16 m Systemic Diam: 2.10 cm Lyman Bishop MD Electronically signed by Lyman Bishop MD Signature Date/Time: 05/12/2021/11:47:51 AM    Final    Disposition   Patient is being discharged home today in good condition.  Follow-up Plans & Appointments     Follow-up Information     Darreld Mclean, PA-C Follow up.   Specialties: Physician Assistant, Cardiology Why: Hospital follow-up with Cardiology scheduled for 05/24/2021 at 10:05am. Please arrive 15 minutes early for check-in. If this day/time does not work for you, please call our office to reschedule. Contact information: [address] [place] [address] [phone]                Discharge Instructions     Diet - low sodium heart healthy   Complete by: As directed    Increase activity slowly   Complete by: As directed        Discharge Medications   Allergies as of 05/13/2021       Reactions   Azithromycin    Other reaction(s): Unknown   Other Other (See Comments)   SOME OF THE "MYCINS"...UNKNOWN REACTION.   Adhesive [tape] Rash    Latex Rash        Medication List     STOP taking these medications    amLODipine 2.5 MG tablet Commonly known as: NORVASC   Diclofenac Sodium CR 100 MG 24 hr tablet       TAKE these medications    acetaminophen 650 MG CR tablet Commonly known as: TYLENOL Take 650 mg by mouth every 8 (eight) hours as needed for pain.   anastrozole 1 MG tablet Commonly known as: ARIMIDEX Take 1 tablet (1 mg total) by mouth daily.   aspirin EC 81 MG tablet Take 81 mg by mouth daily.   diazepam 2 MG tablet Commonly known as: VALIUM Take 1 tablet (2 mg total) by mouth every 6 (six) hours as needed for anxiety.   isosorbide mononitrate 30 MG 24 hr tablet Commonly known as: IMDUR Take 1 tablet (30 mg total) by mouth daily.   levothyroxine 125 MCG tablet Commonly known as: SYNTHROID Take 125 mcg by mouth daily before breakfast.   metoprolol succinate 100 MG 24 hr tablet Commonly known as: TOPROL-XL TAKE 1 TABLET DAILY   nitroGLYCERIN 0.4 MG SL tablet Commonly known as: NITROSTAT Place 1 tablet (0.4 mg total) under the tongue every 5 (five) minutes x 3 doses as needed for chest pain.   pantoprazole 40 MG tablet Commonly known as: PROTONIX Take 40 mg by mouth daily.   REFRESH TEARS OP Apply 1-2 drops to eye daily.   rosuvastatin 10 MG tablet Commonly known as: CRESTOR Take 10 mg by mouth every morning.   Vitamin D3 125 MCG (5000 UT) Caps Take 1 capsule by mouth daily.           Outstanding Labs/Studies   Repeat lipid panel at follow-up visit.  Duration of Discharge Encounter   Greater than 30 minutes including physician time.  Signed, Darreld Mclean, PA-C 05/13/2021, 12:16 PM   Personally seen and examined. Agree with above.     88 y.o. female with non-ST elevation myocardial infarction known underlying coronary artery disease, previously underwent PCI of subtotally occluded right coronary artery with medically  managed CAD otherwise.   Assessment & Plan     Non-ST elevation myocardial infarction -We will complete close to 48 hours of heparin today.  No adverse arrhythmias. - Isosorbide 30 mg started yesterday. - Amlodipine 2.5 mg held because of transient hypotension yesterday.  Blood pressures have rebounded. - Continue with metoprolol succinate 100 mg a day for antianginal support as well. - Continue with statin, goal LDL less than 55.   Essential hypertension - Blood pressure has rebounded.  She was hypotensive yesterday.  Amlodipine 2.5 mg is on hold.  We will continue to monitor.  May need to be reinitiated at a later time.   History of breast cancer in remission - Home anastrozole continued   Hypothyroidism - On Synthroid 125 mcg.   Yesterday discussed case with her primary cardiologist Dr. Lorenza Cambridge.   I am comfortable with her discharge perhaps after lunch today.  She does live alone.  She states that she has 3 cars under the carport to hopefully ward off people from thinking that she lives alone.   Set up close follow-up in 2 to 4 weeks  Candee Furbish, MD

## 2021-05-13 NOTE — Progress Notes (Signed)
Progress Note  Patient Name: Kathy Murphy Date of Encounter: 05/13/2021  [facility] Cardiologist: Sanda Klein, MD   Subjective   Feels well, no further chest discomfort.  Inpatient Medications    Scheduled Meds:  anastrozole  1 mg Oral Daily   aspirin EC  81 mg Oral Daily   isosorbide mononitrate  30 mg Oral Daily   levothyroxine  125 mcg Oral QAC breakfast   metoprolol succinate  100 mg Oral Daily   pantoprazole  40 mg Oral Daily   polyvinyl alcohol  2 drop Both Eyes Daily   rosuvastatin  10 mg Oral [id number]   Continuous Infusions:  heparin 1,150 Units/hr (05/13/21 0327)   PRN Meds: acetaminophen, nitroGLYCERIN, ondansetron (ZOFRAN) IV   Vital Signs    Vitals:   05/12/21 1646 05/12/21 1944 05/12/21 2340 05/13/21 0327  BP: (!) 118/50 119/69 123/61 (!) 163/83  Pulse: 69 76 69 69  Resp: 20 15 18 16   Temp: 97.8 F (36.6 C) 98.4 F (36.9 C) 98.5 F (36.9 C) 98.1 F (36.7 C)  TempSrc: Oral Oral Oral Oral  SpO2: 96% 95% 96% 98%  Weight:    78.2 kg  Height:        Intake/Output Summary (Last 24 hours) at 05/13/2021 0809 Last data filed at 05/13/2021 0327 Gross per 24 hour  Intake 262.62 ml  Output --  Net 262.62 ml   Last 3 Weights 05/13/2021 05/12/2021 05/10/2021  Weight (lbs) 172 lb 6.4 oz 175 lb 11.3 oz 175 lb 12.8 oz  Weight (kg) 78.2 kg 79.7 kg 79.742 kg      Telemetry    No adverse arrhythmias, rare PVCs- Personally Reviewed  ECG    05/13/2021 at 6:44 AM-sinus rhythm with T wave inversion in lateral leads more pronounced than prior EKG.- Personally Reviewed  Physical Exam   GEN: No acute distress.   Neck: No JVD Cardiac: RRR, no murmurs, rubs, or gallops.  Respiratory: Clear to auscultation bilaterally. GI: Soft, nontender, non-distended  MS: No edema; No deformity. Neuro:  Nonfocal  Psych: Normal affect   Labs    High Sensitivity Troponin:   Recent Labs  Lab 05/11/21 2108 05/11/21 2347 05/12/21 0830 05/12/21 1039  TROPONINIHS 36*  124* 285* 345*     Chemistry Recent Labs  Lab 05/11/21 2108 05/12/21 0830 05/13/21 0058  NA 140 140 135  K 3.9 3.5 3.4*  CL 106 105 102  CO2 26 26 25   GLUCOSE 127* 108* 102*  BUN 27* 18 24*  CREATININE 0.94 0.77 0.99  CALCIUM 9.4 9.2 9.0  GFRNONAA 57* >60 54*  ANIONGAP 8 9 8     Lipids No results for input(s): CHOL, TRIG, HDL, LABVLDL, LDLCALC, CHOLHDL in the last 168 hours.  Hematology Recent Labs  Lab 05/11/21 2108 05/13/21 0058  WBC 7.5 5.9  RBC 4.38 4.12  HGB 13.5 12.5  HCT 42.2 39.0  MCV 96.3 94.7  MCH 30.8 30.3  MCHC 32.0 32.1  RDW 13.1 13.2  PLT 138* 127*   Thyroid  Recent Labs  Lab 05/12/21 0830  TSH 0.219*    BNP Recent Labs  Lab 05/12/21 0830  BNP 181.3*    DDimer No results for input(s): DDIMER in the last 168 hours.   Radiology    DG Chest 1 View  Result Date: 05/11/2021 CLINICAL DATA:  Chest pain EXAM: CHEST  1 VIEW.  Patient is rotated. COMPARISON:  Chest x-ray 12/11/2020, CT chest 09/09/2010 FINDINGS: The heart and mediastinal contours are unchanged.  Aortic calcification. No focal consolidation. No pulmonary edema. No pleural effusion. No pneumothorax. No acute osseous abnormality. IMPRESSION: No active disease. Electronically Signed   By: Iven Finn M.D.   On: 05/11/2021 21:36   ECHOCARDIOGRAM COMPLETE  Result Date: 05/12/2021    ECHOCARDIOGRAM REPORT   Patient Name:   Kathy Murphy [facility] Date of Exam: 05/12/2021 Medical Rec #:  [id number]      Height:       68.0 in Accession #:    [id number]     Weight:       175.7 lb Date of Birth:  [birth year]      BSA:          1.934 m Patient Age:    88 years       BP:           145/81 mmHg Patient Gender: F              HR:           64 bpm. Exam Location:  Inpatient Procedure: 2D Echo, Cardiac Doppler and Color Doppler Indications:    Acute ischemic heart disease, unspecified I24.9  History:        Patient has prior history of Echocardiogram examinations, most                 recent 12/12/2020. CHF, Previous  Myocardial Infarction and CAD;                 Risk Factors:Hypertension.  Sonographer:    Bernadene Person RDCS Referring Phys: [id number] [place]  1. Left ventricular ejection fraction, by estimation, is 50 to 55%. Left ventricular ejection fraction by 2D MOD biplane is 50.1 %. The left ventricle has low normal function. The left ventricle demonstrates regional wall motion abnormalities (see scoring diagram/findings for description). There is mild left ventricular hypertrophy. Left ventricular diastolic parameters are consistent with Grade I diastolic dysfunction (impaired relaxation). There is moderate hypokinesis of the left ventricular, basal inferoseptal wall and inferior wall. The average left ventricular global longitudinal strain is -18.7 %. The global longitudinal strain is mildly abnormal with predominant basal and inferoseptal strain abnormalities.  2. Right ventricular systolic function is normal. The right ventricular size is normal. There is normal pulmonary artery systolic pressure. The estimated right ventricular systolic pressure is 14.9 mmHg.  3. Left atrial size was mildly dilated.  4. The mitral valve is grossly normal. Trivial mitral valve regurgitation.  5. The non-coronary cusp appears fixed. The aortic valve is abnormal. There is mild calcification of the aortic valve. Aortic valve regurgitation is not visualized.  6. Aortic dilatation noted. There is mild dilatation of the aortic root, measuring 42 mm. There is borderline dilatation of the ascending aorta, measuring 38 mm.  7. The inferior vena cava is normal in size with greater than 50% respiratory variability, suggesting right atrial pressure of 3 mmHg.  8. Cannot exclude a small PFO. Comparison(s): Changes from prior study are noted. 12/12/2020: LVEF 55-60%, RVSP 48 mmHg. FINDINGS  Left Ventricle: Left ventricular ejection fraction, by estimation, is 50 to 55%. Left ventricular ejection fraction by 2D MOD biplane is 50.1 %.  The left ventricle has low normal function. The left ventricle demonstrates regional wall motion abnormalities. Moderate hypokinesis of the left ventricular, basal inferoseptal wall and inferior wall. The average left ventricular global longitudinal strain is -18.7 %. The global longitudinal strain is abnormal. The left ventricular internal cavity size was normal in size. There is  mild left ventricular hypertrophy. Left ventricular diastolic parameters are consistent with Grade I diastolic dysfunction (impaired relaxation). Normal left ventricular filling pressure. Right Ventricle: The right ventricular size is normal. No increase in right ventricular wall thickness. Right ventricular systolic function is normal. There is normal pulmonary artery systolic pressure. The tricuspid regurgitant velocity is 2.57 m/s, and  with an assumed right atrial pressure of 3 mmHg, the estimated right ventricular systolic pressure is 68.0 mmHg. Left Atrium: Left atrial size was mildly dilated. Right Atrium: Right atrial size was normal in size. Pericardium: There is no evidence of pericardial effusion. Mitral Valve: The mitral valve is grossly normal. Trivial mitral valve regurgitation. Tricuspid Valve: The tricuspid valve is grossly normal. Tricuspid valve regurgitation is mild. Aortic Valve: The non-coronary cusp appears fixed. The aortic valve is abnormal. There is mild calcification of the aortic valve. Aortic valve regurgitation is not visualized. Pulmonic Valve: The pulmonic valve was grossly normal. Pulmonic valve regurgitation is trivial. Aorta: Aortic dilatation noted. There is mild dilatation of the aortic root, measuring 42 mm. There is borderline dilatation of the ascending aorta, measuring 38 mm. Venous: The inferior vena cava is normal in size with greater than 50% respiratory variability, suggesting right atrial pressure of 3 mmHg. IAS/Shunts: The interatrial septum is aneurysmal. Cannot exclude a small PFO.  LEFT  VENTRICLE PLAX 2D                        Biplane EF (MOD) LVIDd:         5.20 cm         LV Biplane EF:   Left LVIDs:         3.60 cm                          ventricular LV PW:         1.20 cm                          ejection LV IVS:        1.10 cm                          fraction by LVOT diam:     2.10 cm                          2D MOD LV SV:         56                               biplane is LV SV Index:   29                               50.1 %. LVOT Area:     3.46 cm                                Diastology                                LV e' medial:  3.00 cm/s LV Volumes (MOD)  LV e' lateral: 5.03 cm/s LV vol d, MOD    118.0 ml A2C:                           2D LV vol d, MOD    145.0 ml      Longitudinal A4C:                           Strain LV vol s, MOD    55.5 ml       2D Strain GLS  -17.2 % A2C:                           (A2C): LV vol s, MOD    73.4 ml       2D Strain GLS  -18.7 % A4C:                           (A3C): LV SV MOD A2C:   62.5 ml       2D Strain GLS  -20.1 % LV SV MOD A4C:   145.0 ml      (A4C): LV SV MOD BP:    65.6 ml       2D Strain GLS  -18.7 %                                Avg: RIGHT VENTRICLE RV S prime:     14.90 cm/s TAPSE (M-mode): 2.4 cm LEFT ATRIUM             Index        RIGHT ATRIUM           Index LA diam:        3.30 cm 1.71 cm/m   RA Area:     14.00 cm LA Vol (A2C):   67.6 ml 34.95 ml/m  RA Volume:   33.20 ml  17.16 ml/m LA Vol (A4C):   66.8 ml 34.53 ml/m LA Biplane Vol: 68.0 ml 35.15 ml/m  AORTIC VALVE             PULMONIC VALVE LVOT Vmax:   71.50 cm/s  PR End Diast Vel: 2.84 msec LVOT Vmean:  50.300 cm/s LVOT VTI:    0.161 m  AORTA Ao Root diam: 4.30 cm Ao Asc diam:  3.80 cm TRICUSPID VALVE TR Peak grad:   26.4 mmHg TR Vmax:        257.00 cm/s  SHUNTS Systemic VTI:  0.16 m Systemic Diam: 2.10 cm Lyman Bishop MD Electronically signed by Lyman Bishop MD Signature Date/Time: 05/12/2021/11:47:51 AM    Final     Cardiac Studies   ECHO this  admission    1. Left ventricular ejection fraction, by estimation, is 50 to 55%. Left  ventricular ejection fraction by 2D MOD biplane is 50.1 %. The left  ventricle has low normal function. The left ventricle demonstrates  regional wall motion abnormalities (see  scoring diagram/findings for description). There is mild left ventricular  hypertrophy. Left ventricular diastolic parameters are consistent with  Grade I diastolic dysfunction (impaired relaxation). There is moderate  hypokinesis of the left ventricular,  basal inferoseptal wall and inferior wall. The average left ventricular  global longitudinal strain is -18.7 %. The global longitudinal strain is  mildly abnormal with predominant  basal and inferoseptal strain  abnormalities.   2. Right ventricular systolic function is normal. The right ventricular  size is normal. There is normal pulmonary artery systolic pressure. The  estimated right ventricular systolic pressure is 25.4 mmHg.   3. Left atrial size was mildly dilated.   4. The mitral valve is grossly normal. Trivial mitral valve  regurgitation.   5. The non-coronary cusp appears fixed. The aortic valve is abnormal.  There is mild calcification of the aortic valve. Aortic valve  regurgitation is not visualized.   6. Aortic dilatation noted. There is mild dilatation of the aortic root,  measuring 42 mm. There is borderline dilatation of the ascending aorta,  measuring 38 mm.   7. The inferior vena cava is normal in size with greater than 50%  respiratory variability, suggesting right atrial pressure of 3 mmHg.   8. Cannot exclude a small PFO.   Comparison(s): Changes from prior study are noted. 12/12/2020: LVEF 55-60%,  RVSP 48 mmHg.   Patient Profile     88 y.o. female with non-ST elevation myocardial infarction known underlying coronary artery disease, previously underwent PCI of subtotally occluded right coronary artery with medically managed CAD  otherwise.  Assessment & Plan    Non-ST elevation myocardial infarction -We will complete close to 48 hours of heparin today.  No adverse arrhythmias. - Isosorbide 30 mg started yesterday. - Amlodipine 2.5 mg held because of transient hypotension yesterday.  Blood pressures have rebounded. - Continue with metoprolol succinate 100 mg a day for antianginal support as well. - Continue with statin, goal LDL less than 55.  Essential hypertension - Blood pressure has rebounded.  She was hypotensive yesterday.  Amlodipine 2.5 mg is on hold.  We will continue to monitor.  May need to be reinitiated at a later time.  History of breast cancer in remission - Home anastrozole continued  Hypothyroidism - On Synthroid 125 mcg.  Yesterday discussed case with her primary cardiologist Dr. Lorenza Cambridge.  I am comfortable with her discharge perhaps after lunch today.  She does live alone.  She states that she has 3 cars under the carport to hopefully ward off people from thinking that she lives alone.  Set up close follow-up in 2 to 4 weeks     For questions or updates, please contact [place] Please consult [email or web address] for contact info under        Signed, Candee Furbish, MD  05/13/2021, 8:09 AM

## 2021-05-13 NOTE — Progress Notes (Signed)
CARDIAC REHAB PHASE I  ? ?PRE:  Rate/Rhythm: 77 SR ? ?BP:  Sitting: 136/76     ? ?SaO2: 95 RA ? ?MODE:  Ambulation: 250 ft  ? ?POST:  Rate/Rhythm: 82 SR ? ?BP:  Sitting: 148/60 ? ?  SaO2: 94 RA ? ? ?Pt ambulated 255ft in hallway assist of one with front wheel walker. Pt states some baseline SOB, but denies dizziness or CP. Pt returned to room. Encouraged continued ambulation with emphasis on safety, reviewed restrictions. Will refer to CRP II [place]. ? ?[id number] ?Rufina Falco, RN BSN ?05/13/2021 ?1:36 PM ? ?

## 2021-05-13 NOTE — Progress Notes (Signed)
Discharge instructions given to pt.  Belongings with pt. ?

## 2021-05-15 NOTE — Progress Notes (Unsigned)
Cardiology Office Note:    Date:  05/15/2021   ID:  Kathy Murphy, DOB 02-25-1928, MRN [id number]  PCP:  Burnard Bunting, MD  Cardiologist:  Sanda Klein, MD  Electrophysiologist:  None   Referring MD: Burnard Bunting, MD   Chief Complaint: hospital follow-up after NSTEMI  History of Present Illness:    Kathy Murphy is a 88 y.o. female with a history of diffuse CAD s/p PCI with 2 overlapping DES in 2017, chronic CHF with mildly reduced EF, mild dilatation of aortic root, hypertension, hyperlipidemia, GERD, and breast cancer in remission on long-term Anastrazole who is followed by Dr. Sallyanne Kuster and presents today for hospital up after NSTEMI.  Patient has a history of CAD. Cardiac catheterization in 01/2016 showed severe multivessel disease with subtotally occluded mid RCA followed by 2 tandem high-grade stenoses in distal RCA with collaterals from the LAD and LCX, 95% stenosis of ostial 2nd Diag, 75% stenosis of proximal 1st Diag, 70% stenosis of the RI, 50% stenosis of the proximal to mid LAD followed by 70% stenosis of distal LAD. PCI was attempted at that time but PTCA failed due to poor guide catheter support. She was taken back to the cath lab on 02/11/2016 and underwent successful PCI with 2 overlapping DES to the RCA. Echo in 01/2016 showed LVEF of 40-45% with moderate diffuse hypokinesis and grade 1 diastolic dysfunction as well as trivial AI. Despite extensive CAD, patient has had very infrequent episodes of mild chest pain over the last several years. She was admitted in 12/2020 for NSTEMI and UTI with associated altered mental status. Echo showed LVEF of 55-60% with normal wall motion. An attempt was made for cardiac catheterization but she was unable to cooperate with procedure. The plan was finally made for medical management and patient was started on Plavix and anti-anginals were added.   Patient was last seen by Dr. Sallyanne Kuster on 05/04/2021 at which time she was doing well from  cardiac standpoint.  She was recently admitted from 05/11/2021 to 05/13/2021 for NSTEMI after presenting with sudden onset of substernal chest pain. EKG showed no acute ischemic changes. High-sensitivity troponin peaked at 345. Echo showed Echo showed LVEF 50-55% with moderate hypokinesis of the basal inferior septal wall and inferior walls as well as grade 1 diastolic dysfunction. Given prior cath findings and advanced age, decision was made to treat medically. She was treated with IV Heparin for 48 hours and was continued on home medications.  Patient presents today for follow-up. ***  CAD Recent NSETMI Patient has known diffuse CAD s/p 2 overlapping DES to RCA in 2017. She was recently admitted with NSTEMI. Echo showed LVEF 50-55% with moderate hypokinesis of the basal inferior septal wall and inferior walls. Given prior cath findings and advanced age, decision was made to treat medically.  - *** - Continue Aspirin '81mg'$  daily, Toprol-XL '100mg'$  daily, Imdur 30 mg daily, and Crestor 10 mg daily. Home Amlodipine was stopped during recent admission due to transient hypotension. ***   Chronic CHF with Mildly Reduced EF History of mildly reduced EF of 40-45% in the past.  Echo during recent admission showed LVEF of 50-55% with with moderate hypokinesis of the basal inferior septal wall and inferior walls as well as grade 1 diastolic dysfunction. - Euvolemic on exam.  - Continue Toprol-XL '100mg'$  daily. - Continue Imdur '30mg'$  daily.  - Discussed importance of daily weights and sodium/fluid restrictions.   Hypertension BP well controlled.  - Continue Toprol-XL '100mg'$  daily and Imdur '30mg'$   daily.  - Patient was previously on Amlodipine 2.'5mg'$  daily but this was stopped during recent admission due to transient hypotension. ***   Hyperlipidemia Last lipid panel in 12/2020: Total Cholesterol 82, Triglycerides 31, HDL 40, LDL 36. LDL goal <55.  - Continue Crestor '10mg'$  daily. - Will repeat lipid panel and LFTs.    Dilatation of Aortic Root Echo during recent admission showed mild dilation of the aortic root measuring 43m. Stable from last Echo in 12/2020.  - Consider repeat Echo in 1 year to ensure this is stable.   Past Medical History:  Diagnosis Date   Arthritis    Breast cancer ([place]    Right   CAD (coronary artery disease)    Cancer (HCC)    thyroid   Chronic combined systolic and diastolic CHF (congestive heart failure) (HCC)    Dilated aortic root (HCC)    First degree AV block    GERD (gastroesophageal reflux disease)    GI bleed 1980   AFTER POLYP EXCISION   Heart disease, hypertensive    Hypercholesterolemia    Hypertension    Hypothyroidism    Incontinence of urine    Myalgia    Myocardial infarction ([place]    Obesity    Pain in joints    Retinal detachment    Unsteady gait    UTI (urinary tract infection)    Vision loss of left eye    Vomiting     Past Surgical History:  Procedure Laterality Date   ABDOMINAL HYSTERECTOMY  1977   BREAST BIOPSY     BREAST LUMPECTOMY     x5   CARDIAC CATHETERIZATION N/A 02/08/2016   Procedure: Left Heart Cath and Coronary Angiography;  Surgeon: HBelva Crome MD;  [facility];  Service: Cardiovascular;  Laterality: N/A;   CARDIAC CATHETERIZATION N/A 02/11/2016   Procedure: Coronary Stent Intervention;  Surgeon: HBelva Crome MD;  [facility];  Service: Cardiovascular;  Laterality: N/A;   CATARACT EXTRACTION, BILATERAL     CERVICAL LAMINECTOMY     DENTAL SURGERY  1952   EVACUATION BREAST HEMATOMA Right 01/09/2017   Procedure: EVACUATION RIGHT MASTECTOMY FLUID COLLECTION;  Surgeon: WRolm Bookbinder MD;  [facility]  Service: General;  Laterality: Right;   [place] 2012   left   TOTAL KNEE ARTHROPLASTY Right 05/18/2017   Procedure: RIGHT TOTAL KNEE ARTHROPLASTY;  Surgeon: NNetta Cedars MD;  [facility]  Service:  Orthopedics;  Laterality: Right;   TOTAL MASTECTOMY Right 11/27/2016   TOTAL MASTECTOMY Right 11/27/2016   Procedure: RIGHT TOTAL MASTECTOMY;  Surgeon: WRolm Bookbinder MD;  [facility]  Service: General;  Laterality: Right;    Current Medications: No outpatient medications have been marked as taking for the 05/24/21 encounter (Appointment) with GDarreld Mclean PA-C.     Allergies:   Azithromycin, Other, Adhesive [tape], and Latex   Social History   Socioeconomic History   Marital status: Widowed    Spouse name: Not on file   Number of children: Not on file   Years of education: Not on file   Highest education level: Not on file  Occupational History   Not on file  Tobacco Use   Smoking status: Never   Smokeless tobacco: Never  Vaping Use   Vaping Use: Never used  Substance and Sexual Activity   Alcohol use:  No   Drug use: No   Sexual activity: Not on file  Other Topics Concern   Not on file  Social History Narrative   Not on file   Social Determinants of Health   Financial Resource Strain: Not on file  Food Insecurity: No Food Insecurity   Worried About Running Out of Food in the Last Year: Never true   Ran Out of Food in the Last Year: Never true  Transportation Needs: No Transportation Needs   Lack of Transportation (Medical): No   Lack of Transportation (Non-Medical): No  Physical Activity: Not on file  Stress: No Stress Concern Present   Feeling of Stress : Only a little  Social Connections: Moderately Integrated   Frequency of Communication with Friends and Family: Three times a week   Frequency of Social Gatherings with Friends and Family: Three times a week   Attends Religious Services: 1 to 4 times per year   Active Member of Clubs or Organizations: Yes   Attends Archivist Meetings: 1 to 4 times per year   Marital Status: Widowed     Family History: The patient's family history includes Heart disease in her mother.  ROS:    Please see the history of present illness.     EKGs/Labs/Other Studies Reviewed:    The following studies were reviewed today:  Left Cardiac Catheterization 02/08/2016: Prox LAD to Mid LAD lesion, 50 %stenosed.   Significant coronary artery disease with subtotally occluded mid right coronary collateralized from the left coronary artery (LAD and circumflex). Severe ostial obstruction in the second diagonal, 75% stenosis in the first diagonal, 70% stenosis within a region of tortuosity in the ramus intermedius, and 50% narrowing in the proximal to mid LAD. The distal LAD is diffusely diseased without focal obstruction. Low normal LV function with EF estimated to be 50%. Failed PTCA of the mid right coronary due to poor guide catheter support.   Recommendation: Begin dual antiplatelet therapy. Re-attempt PCI of the right coronary from the femoral approach on 02/11/2016. Will discuss with CTO team as well. Intensify anti-ischemic therapy. _______________  Coronary Stent Intervention 02/11/2016: Mid RCA chronic total occlusion collateralized from the left coronary artery. Successful overlapping stents from the distal RCA no the margin of the PDA back to the proximal RCA using two 38 mm long Synergy drug-eluting stents postdilated to 3.0 mm in diameter with 100% stenosis reduced to 0%. Grade 3 flow was noted. Angio-Seal was used for successful hemostasis.   Recommendation: Discharge in a.m. on aspirin and Plavix. Ambulate later this evening _______________  Echocardiogram 05/12/2021: Impressions:  1. Left ventricular ejection fraction, by estimation, is 50 to 55%. Left  ventricular ejection fraction by 2D MOD biplane is 50.1 %. The left  ventricle has low normal function. The left ventricle demonstrates  regional wall motion abnormalities (see  scoring diagram/findings for description). There is mild left ventricular  hypertrophy. Left ventricular diastolic parameters are consistent with   Grade I diastolic dysfunction (impaired relaxation). There is moderate  hypokinesis of the left ventricular,  basal inferoseptal wall and inferior wall. The average left ventricular  global longitudinal strain is -18.7 %. The global longitudinal strain is  mildly abnormal with predominant basal and inferoseptal strain  abnormalities.   2. Right ventricular systolic function is normal. The right ventricular  size is normal. There is normal pulmonary artery systolic pressure. The  estimated right ventricular systolic pressure is 70.6 mmHg.   3. Left atrial size was mildly dilated.  4. The mitral valve is grossly normal. Trivial mitral valve  regurgitation.   5. The non-coronary cusp appears fixed. The aortic valve is abnormal.  There is mild calcification of the aortic valve. Aortic valve  regurgitation is not visualized.   6. Aortic dilatation noted. There is mild dilatation of the aortic root,  measuring 42 mm. There is borderline dilatation of the ascending aorta,  measuring 38 mm.   7. The inferior vena cava is normal in size with greater than 50%  respiratory variability, suggesting right atrial pressure of 3 mmHg.   8. Cannot exclude a small PFO.   Comparison(s): Changes from prior study are noted. 12/12/2020: LVEF 55-60%, RVSP 48 mmHg.    EKG:  EKG *** ordered today. EKG personally reviewed and demonstrates ***.  Recent Labs: 12/11/2020: ALT 11 12/15/2020: Magnesium 2.0 05/12/2021: B Natriuretic Peptide 181.3; TSH 0.219 05/13/2021: BUN 24; Creatinine, Ser 0.99; Hemoglobin 12.5; Platelets 127; Potassium 3.4; Sodium 135  Recent Lipid Panel    Component Value Date/Time   CHOL 82 12/13/2020 0536   CHOL 116 04/16/2017 1037   TRIG 31 12/13/2020 0536   HDL 40 (L) 12/13/2020 0536   HDL 45 04/16/2017 1037   CHOLHDL 2.1 12/13/2020 0536   VLDL 6 12/13/2020 0536   LDLCALC 36 12/13/2020 0536   LDLCALC 57 04/16/2017 1037    Physical Exam:    Vital Signs: There were no vitals taken  for this visit.    Wt Readings from Last 3 Encounters:  05/13/21 172 lb 6.4 oz (78.2 kg)  05/10/21 175 lb 12.8 oz (79.7 kg)  05/04/21 177 lb (80.3 kg)     General: 88 y.o. female in no acute distress. HEENT: Normocephalic and atraumatic. Sclera clear. EOMs intact. Neck: Supple. No carotid bruits. No JVD. Heart: *** RRR. Distinct S1 and S2. No murmurs, gallops, or rubs. Radial and distal pedal pulses 2+ and equal bilaterally. Lungs: No increased work of breathing. Clear to ausculation bilaterally. No wheezes, rhonchi, or rales.  Abdomen: Soft, non-distended, and non-tender to palpation. Bowel sounds present in all 4 quadrants.  MSK: Normal strength and tone for age. *** Extremities: No lower extremity edema.    Skin: Warm and dry. Neuro: Alert and oriented x3. No focal deficits. Psych: Normal affect. Responds appropriately.   Assessment:    No diagnosis found.  Plan:     Disposition: Follow up in ***   Medication Adjustments/Labs and Tests Ordered: Current medicines are reviewed at length with the patient today.  Concerns regarding medicines are outlined above.  No orders of the defined types were placed in this encounter.  No orders of the defined types were placed in this encounter.   There are no Patient Instructions on file for this visit.   Signed, Darreld Mclean, PA-C  05/15/2021 1:19 PM    [facility]

## 2021-05-16 ENCOUNTER — Other Ambulatory Visit: Payer: Self-pay

## 2021-05-16 DIAGNOSIS — I110 Hypertensive heart disease with heart failure: Secondary | ICD-10-CM

## 2021-05-16 DIAGNOSIS — I5042 Chronic combined systolic (congestive) and diastolic (congestive) heart failure: Secondary | ICD-10-CM

## 2021-05-16 NOTE — Patient Outreach (Signed)
[facility]) Care Management ? ?05/16/2021 ? ?Louellen Molder ?1928/01/02 ?[id number] ? ? ?Hospital referral received from Netta Cedars, RN for chronic disease management. Assigned patient to Joellyn Quails, RN Care Coordinator for follow up. ? ?Philmore Pali ?[place] Management Assistant ?[phone] ? ?

## 2021-05-17 ENCOUNTER — Other Ambulatory Visit: Payer: Self-pay | Admitting: *Deleted

## 2021-05-17 ENCOUNTER — Encounter: Payer: Self-pay | Admitting: *Deleted

## 2021-05-17 NOTE — Patient Outreach (Signed)
[facility]) Care Management Telephonic RN Care Manager Note   05/17/2021 Name:  Kathy Murphy MRN:  [id number] DOB:  [birth year]  Summary: Post hospital follow up initial outreach completed Kathy Murphy confirms she is doing fair  She remains hard of hearing (HOH) She reports she is home walking with assistance of durable medical equipment (DME) at a slower pace to decrease shortness of breath (sob)  Transition of care (TOC) services noted to be completed by primary care MD office staff [facility] - Dr Deirdre Pippins Transition of Care will be completed by primary care provider office who will refer to [facility] care management if needed. But with brief TOC review she confirms she has follow up appointments and transportation (3 cars others drive for her prn) to see pcp 05/18/21, cardiology 05/24/21  She confirms she is not taking Norvasc and Diclofenac since discharge but voice concern with how long she will be able to remain off of Diclofenac Sodium CR 100 MG 24 hr tablet related to pain. She states she generally can not go without it too long. She agrees to review this with Dr Reynaldo Minium on 05/18/21  She voices she is aware she has lots of blockage and "doctors told me there is not much they can do for me"  Has received outreach related to cardiac rehab but reports not sure of her tolerance level related to rehab at this time   Recommendations/Changes made from today's visit: Reviewed [facility] services and differences in complex care RN CM & RN DM Reviewed medicines to discontinue per after summary visit (discharge sheets)-amlodipine 2.5 MG tablet (NORVASC) & Diclofenac sodium CR 100 mg 24 hr tablet Discussed Nonsteroidal anti-inflammatory drugs (NSAIDs) commonly increase risk of heart attack and stroke as it elevates the BP with pt- encouraged to discuss with pcp Discussed briefly and encouraged cardiac rehab Encouraged home safety Sent EMMI education on acute MI lifestyle  changes, cardiac rehab information  Subjective: Kathy Murphy is an 88 y.o. year old female who is a primary patient of Burnard Bunting, MD. The care management team was consulted for assistance with care management and/or care coordination needs.    Telephonic RN Care Manager completed Telephone Visit today.   Objective:  Medications Reviewed Today     Reviewed by Marvell Fuller, CPhT (Pharmacy Technician) on 05/12/21 at 1623  Med List Status: Complete   Medication Order Taking? Sig Documenting Provider Last Dose Status Informant  acetaminophen (TYLENOL) 650 MG CR tablet [id number] No Take 650 mg by mouth every 8 (eight) hours as needed for pain.  Patient not taking: Reported on 05/04/2021   [provider] Unknown Active Self, Care Giver  amLODipine (NORVASC) 2.5 MG tablet [id number] No Take 1 tablet (2.5 mg total) by mouth daily. Hosie Poisson, MD Unknown Active Self, Care Giver  anastrozole (ARIMIDEX) 1 MG tablet [id number] Yes Take 1 tablet (1 mg total) by mouth daily. Benay Pike, MD 05/11/2021 Active Self, Care Giver  aspirin EC 81 MG tablet [id number] Yes Take 81 mg by mouth daily. [provider] 05/11/2021 Active Self, Care Giver  Carboxymethylcellulose Sodium (REFRESH TEARS OP) [id number] No Apply 1-2 drops to eye daily. [provider] Unknown Active Self, Care Giver  Cholecalciferol (VITAMIN D3) 125 MCG (5000 UT) CAPS [id number] No Take 1 capsule by mouth daily. [provider] Unknown Active Self, Care Giver  diazepam (VALIUM) 2 MG tablet [id number] No Take 1 tablet (2 mg total) by mouth every 6 (six) hours as  needed for anxiety. Hosie Poisson, MD Unknown Active Self, Care Giver  Diclofenac Sodium CR 100 MG 24 hr tablet [id number] Yes Take 100 mg by mouth daily. [provider] 05/11/2021 Active Self, Care Giver  isosorbide mononitrate (IMDUR) 30 MG 24 hr tablet [id number] Yes Take 1 tablet (30 mg total) by mouth daily. Hosie Poisson, MD  unk Active Self, Care Giver  levothyroxine (SYNTHROID, LEVOTHROID) 125 MCG tablet [id number] Yes Take 125 mcg by mouth daily before breakfast. [provider] 05/11/2021 Active Self, Care Giver  metoprolol succinate (TOPROL-XL) 100 MG 24 hr tablet [id number] Yes TAKE 1 TABLET DAILY  Patient taking differently: Take 100 mg by mouth daily.   Croitoru, Mihai, MD 05/11/2021 0830 Active Self, Care Giver  nitroGLYCERIN (NITROSTAT) 0.4 MG SL tablet [id number] No Place 1 tablet (0.4 mg total) under the tongue every 5 (five) minutes x 3 doses as needed for chest pain.  Patient not taking: Reported on 05/04/2021   Hosie Poisson, MD Unknown Active Self, Care Giver           Med Note Kathy Murphy May 04, 2021  2:47 PM) Patient has on hand  pantoprazole (PROTONIX) 40 MG tablet [id number] Yes Take 40 mg by mouth daily. [provider] 05/11/2021 Active Self, Care Giver  rosuvastatin (CRESTOR) 10 MG tablet [id number] Yes Take 10 mg by mouth every morning. [provider] 05/11/2021 Active Self, Care Giver             SDOH:  (Social Determinants of Health) assessments and interventions performed:  SDOH Interventions    Flowsheet Row Most Recent Value  SDOH Interventions   Food Insecurity Interventions Intervention Not Indicated  Financial Strain Interventions Intervention Not Indicated  Housing Interventions Intervention Not Indicated  Intimate Partner Violence Interventions Intervention Not Indicated  Physical Activity Interventions Intervention Not Indicated  [88 yr old recent 05/11/21 NSTEMI  CHF CAD pt]  Stress Interventions Intervention Not Indicated  Social Connections Interventions Intervention Not Indicated  Transportation Interventions Intervention Not Indicated       Care Plan  Review of patient past medical history, allergies, medications, health status, including review of consultants reports, laboratory and other test data, was performed as part of  comprehensive evaluation for care management services.   Care Plan : RN Care Manager Plan of Care  Updates made by Barbaraann Faster, RN since 05/17/2021 12:00 AM     Problem: Complex Care Coordination Needs and disease management in patient with CHF, CAD, UTI, Falls   Priority: High     Long-Range Goal: Establish Plan of Care for Management Complex SDOH Barriers, disease management and Care Coordination Needs in patient with CHF, CAD, UTI, Falls   Start Date: 01/18/2021  This Visit's Progress: On track  Recent Progress: On track  Priority: High  Note:   Current Barriers:  Knowledge Deficits related to plan of care for management of CHF, CAD, and NSTEMI, Falls  Care Coordination needs related to Limited education about NSTEMI, CHF, CAD, Falls* 02/21/21 active in home, community, church, strong support of friends Barrier: Hard of hearing (HOH), access to internet complications, understanding disease management  RN CM Clinical Goal(s):  Patient will verbalize basic understanding of  CHF, CAD, and NSTEMI, falls disease process and self health management plan for prevention of falls  through collaboration with Consulting civil engineer, provider, and care team.   Interventions: Provide home management education verbally and via mail as needed on medical conditions Inter-disciplinary care team collaboration (see  longitudinal plan of care) Evaluation of current treatment plan related to  self management and patient's adherence to plan as established by provider 02/22/21 Review of [facility] progression with discussion of transfer to [facility] disease management program She does discuss how busy she stays but is open to quarterly outreaches  05/17/21 Completion of post admission initial assessment after admission 3/1-3/23 for NSTEMI  NSTEMI/CAD Interventions: Status on track yes Long term goal 05/17/21 MI on 05/11/21 Assessed understanding of CAD diagnosis Medications reviewed including medications utilized in CAD  treatment plan Discussed importance of daily weight and advised patient to weigh and record daily; Discussed the importance of keeping all appointments with provider; Assess for any worsening symptoms of CAD, chest pain, etc  Heart Failure Interventions: Status on track yes Long term goal  Discussed importance of daily weight and advised patient to weigh and record daily; Discussed the importance of keeping all appointments with provider; Assess for any worsening symptoms of CHF  Falls Interventions: Status on track yes short term goal 05/17/21 denies fall recently "not since last year" (2022) Advised patient of importance of notifying provider of falls Assessed for falls since last encounter   Patient Goals/Self-Care Activities: Patient will self administer medications as prescribed Patient will attend all scheduled provider appointments Patient will continue to perform ADL's independently Patient will continue to perform IADL's independently Patient will call provider office for new concerns or questions Patient will call RN CM to assist with advocating for her prn for worsening symptoms related to UTIs  Follow Up Plan:  The patient has been provided with contact information for the care management team and has been advised to call with any health related questions or concerns.  The care management team will reach out to the patient again over the next 15+ business  days.       Plan: The patient has been provided with contact information for the care management team and has been advised to call with any health related questions or concerns.  The care management team will reach out to the patient again over the next 15+ business  days.  Brookelle Pellicane L. Lavina Hamman, RN, BSN, [place] Coordinator Office number [phone] [facility] number [phone] Fax number [phone]

## 2021-05-19 DIAGNOSIS — I10 Essential (primary) hypertension: Secondary | ICD-10-CM | POA: Diagnosis not present

## 2021-05-19 DIAGNOSIS — E785 Hyperlipidemia, unspecified: Secondary | ICD-10-CM | POA: Diagnosis not present

## 2021-05-19 DIAGNOSIS — E039 Hypothyroidism, unspecified: Secondary | ICD-10-CM | POA: Diagnosis not present

## 2021-05-19 DIAGNOSIS — R7989 Other specified abnormal findings of blood chemistry: Secondary | ICD-10-CM | POA: Diagnosis not present

## 2021-05-24 ENCOUNTER — Other Ambulatory Visit: Payer: Self-pay | Admitting: *Deleted

## 2021-05-24 ENCOUNTER — Other Ambulatory Visit: Payer: Self-pay

## 2021-05-24 ENCOUNTER — Ambulatory Visit (INDEPENDENT_AMBULATORY_CARE_PROVIDER_SITE_OTHER): Payer: Medicare Other | Admitting: Student

## 2021-05-24 ENCOUNTER — Encounter: Payer: Self-pay | Admitting: Student

## 2021-05-24 ENCOUNTER — Other Ambulatory Visit: Payer: Self-pay | Admitting: Student

## 2021-05-24 VITALS — BP 126/84 | HR 80 | Ht 68.0 in | Wt 175.0 lb

## 2021-05-24 DIAGNOSIS — Z1212 Encounter for screening for malignant neoplasm of rectum: Secondary | ICD-10-CM | POA: Diagnosis not present

## 2021-05-24 DIAGNOSIS — R82998 Other abnormal findings in urine: Secondary | ICD-10-CM | POA: Diagnosis not present

## 2021-05-24 DIAGNOSIS — I10 Essential (primary) hypertension: Secondary | ICD-10-CM

## 2021-05-24 DIAGNOSIS — I5020 Unspecified systolic (congestive) heart failure: Secondary | ICD-10-CM | POA: Diagnosis not present

## 2021-05-24 DIAGNOSIS — I77810 Thoracic aortic ectasia: Secondary | ICD-10-CM

## 2021-05-24 DIAGNOSIS — R42 Dizziness and giddiness: Secondary | ICD-10-CM | POA: Diagnosis not present

## 2021-05-24 DIAGNOSIS — E785 Hyperlipidemia, unspecified: Secondary | ICD-10-CM | POA: Diagnosis not present

## 2021-05-24 DIAGNOSIS — I2510 Atherosclerotic heart disease of native coronary artery without angina pectoris: Secondary | ICD-10-CM | POA: Diagnosis not present

## 2021-05-24 MED ORDER — ISOSORBIDE MONONITRATE ER 30 MG PO TB24: 30.0000 mg | ORAL_TABLET | Freq: Every day | ORAL | 3 refills | Status: DC

## 2021-05-24 MED ORDER — ISOSORBIDE MONONITRATE ER 30 MG PO TB24: 30.0000 mg | ORAL_TABLET | Freq: Every day | ORAL | 0 refills | Status: DC

## 2021-05-24 NOTE — Patient Outreach (Signed)
[place] [facility]) Care Management ?Telephonic RN Care Manager Note ? ? ?05/24/2021 ?Name:  Kathy Murphy MRN:  [id number] DOB:  1927-03-14 ? ?Summary: ?Follow up outreach after her cardiology office visit  ?She reports she attended this visit with a friend to have assistance in taking notes for her  ?She reports she is doing well today except remains with some back pain after her extended 05/11/21 ED visit as the stretcher was uncomfortable ?She denied further chest pain but some shortness of breath with exertion  ?She is to continue aspirin 81 mg daily and Imdur 30 mg daily  ? ?She reports an outreach from cardiac rehab navigator today but wants to postpone for now as she is busy with a large home and various appointments. Again she voices concern if she will be able to tolerate rehab 3 days a week sessions  ? ?Confirms she continues to drive locally to appointments alone ?Dental appointment on 05/25/21 (by [employer] off [address]) ?Ophthalmology- Dr Nickolas Madrid ? ?Recommendations/Changes made from today's visit: ?Follow up outreach ?Assessed for worsening needs or care coordination needs ?Review of medications ?Discussed cardiac rehab and possible postponing it until she believes she is able to tolerate the sessions and/or coordination of the schedule to fit her schedule- discuss outreach to cardiac rehab staff and MD ?Encouraged rest  ?Discussed follow up outreach  ?Outreach to [facility] cardiac rehab Spoke with [place], Armed forces operational officer, BSN ?Cardiac and Pulmonary Rehab Nurse Navigator about pt concerns with cardiac rehab related to tolerance of 3 days a week, age, hx of vertigo, fall risk  [facility] discussed [facility] (balance oriented overall steadiness training) maybe a transitional option for the patient prior to cardiac rehab (this would require a MD referral/order to the program) Plus her cardiac rehab authorization would be available to patient for months ?Messages (chat)  sent to Dr Sallyanne Kuster and Virgie Dad PA with this information about Boost.(Included Boost brochure) ?Mailed pt a copy of the Boost (Balance oriented overall steadiness training) brochure  ? ? ?Subjective: ?Kathy Murphy is an 88 y.o. year old female who is a primary patient of Burnard Bunting, MD. The care management team was consulted for assistance with care management and/or care coordination needs.   ? ?Telephonic RN Care Manager completed Telephone Visit today.  ? ?Objective: ? ?Medications Reviewed Today   ? ? Reviewed by Eppie Gibson (Physician Assistant Certified) on 78/29/56 at 1342  Med List Status: <None>  ? ?Medication Order Taking? Sig Documenting Provider Last Dose Status Informant  ?acetaminophen (TYLENOL) 650 MG CR tablet [id number] Yes Take 650 mg by mouth every 8 (eight) hours as needed for pain. [provider] Taking Active Self, Care Giver  ?anastrozole (ARIMIDEX) 1 MG tablet [id number] Yes Take 1 tablet (1 mg total) by mouth daily. Benay Pike, MD Taking Active Self, Care Giver  ?aspirin EC 81 MG tablet [id number] Yes Take 81 mg by mouth daily. [provider] Taking Active Self, Care Giver  ?Carboxymethylcellulose Sodium (REFRESH TEARS OP) [id number] Yes Apply 1-2 drops to eye daily. [provider] Taking Active Self, Care Giver  ?Cholecalciferol (VITAMIN D3) 125 MCG (5000 UT) CAPS [id number] Yes Take 1 capsule by mouth daily. [provider] Taking Active Self, Care Giver  ?diazepam (VALIUM) 2 MG tablet [id number] Yes Take 1 tablet (2 mg total) by mouth every 6 (six) hours as needed for anxiety. Hosie Poisson, MD Taking Active Self, Care Giver  ?isosorbide mononitrate (IMDUR) 30 MG 24  hr tablet [id number]  Take 1 tablet (30 mg total) by mouth daily. Croitoru, Mihai, MD  Active   ?levothyroxine (SYNTHROID, LEVOTHROID) 125 MCG tablet [id number] Yes Take 125 mcg by mouth daily before breakfast. [provider] Taking Active Self, Care Giver   ?metoprolol succinate (TOPROL-XL) 100 MG 24 hr tablet [id number] Yes TAKE 1 TABLET DAILY  ?Patient taking differently: Take 100 mg by mouth daily.  ? Croitoru, Mihai, MD Taking Active Self, Care Giver  ?nitroGLYCERIN (NITROSTAT) 0.4 MG SL tablet [id number] Yes Place 1 tablet (0.4 mg total) under the tongue every 5 (five) minutes x 3 doses as needed for chest pain. Hosie Poisson, MD Taking Active Self, Care Giver  ?         ?Med Note [name] May 04, 2021  2:47 PM) Patient has on hand  ?pantoprazole (PROTONIX) 40 MG tablet [id number] Yes Take 40 mg by mouth daily. [provider] Taking Active Self, Care Giver  ?rosuvastatin (CRESTOR) 10 MG tablet [id number] Yes Take 10 mg by mouth every morning. [provider] Taking Active Self, Care Giver  ? ?  ?  ? ?  ? ? ? ?SDOH:  (Social Determinants of Health) assessments and interventions performed:  ? ? ?Care Plan ? ?Review of patient past medical history, allergies, medications, health status, including review of consultants reports, laboratory and other test data, was performed as part of comprehensive evaluation for care management services.  ? ?Care Plan : RN Care Manager Plan of Care  ?Updates made by Barbaraann Faster, RN since 05/24/2021 12:00 AM  ?  ? ?Problem: Complex Care Coordination Needs and disease management in patient with CHF, CAD, UTI, Falls   ?Priority: High  ?  ? ?Long-Range Goal: Establish Plan of Care for Management Complex SDOH Barriers, disease management and Care Coordination Needs in patient with CHF, CAD, UTI, Falls   ?Start Date: 01/18/2021  ?This Visit's Progress: On track  ?Recent Progress: On track  ?Priority: High  ?Note:   ?Current Barriers:  ?Knowledge Deficits related to plan of care for management of CHF, CAD, and NSTEMI, Falls  ?Care Coordination needs related to Limited education about NSTEMI, CHF, CAD, Falls* ?02/21/21 active in home, community, church, strong support of friends ?Admission 3/1-3/23  NSTEMI ?Barrier: Hard of hearing (HOH), access to internet complications, understanding disease management ? ?RN CM Clinical Goal(s):  ?Patient will verbalize basic understanding of  CHF, CAD, and NSTEMI, falls disease process and self health management plan for prevention of falls  through collaboration with RN Care manager, provider, and care team.  ? ?Interventions: ?Provide home management education verbally and via mail as needed on medical conditions ?Inter-disciplinary care team collaboration (see longitudinal plan of care) ?Evaluation of current treatment plan related to  self management and patient's adherence to plan as established by provider ?02/22/21 Review of THN progression with discussion of transfer to [facility] disease management program She does discuss how busy she stays but is open to quarterly outreaches  ?05/17/21 Completion of post admission initial assessment after admission 3/1-3/23 for NSTEMI ? ?NSTEMI/CAD Interventions: Status on track yes Long term goal 05/17/21 MI on 05/11/21 ?Assessed understanding of CAD diagnosis ?Medications reviewed including medications utilized in CAD treatment plan ?Discussed importance of daily weight and advised patient to weigh and record daily; ?Discussed the importance of keeping all appointments with provider; ?Assess for any worsening symptoms of CAD, chest pain, etc ?05/24/21 reviewed cardiology follow up appointment, medications, cardiac rehab ?Discussed cardiac rehab  and possible postponing it until she believes she is able to tolerate the sessions and/or coordination of the schedule to fit her schedule- discuss outreach to cardiac rehab staff and MD ?Encouraged rest  ?Discussed follow up outreach  ?Outreach to [facility] cardiac rehab Spoke with [place], Armed forces operational officer, BSN ?Cardiac and Pulmonary Rehab Nurse Navigator about pt concerns with cardiac rehab related to tolerance of 3 days a week, age, hx of vertigo, fall risk  [facility] discussed [facility]  [facility] (balance oriented overall steadiness training) maybe a transitional option for the patient prior to cardiac rehab (this would require a MD referral/order to the program) Plus her cardiac rehab authorization

## 2021-05-24 NOTE — Patient Instructions (Signed)
Medication Instructions:  ?Your physician recommends that you continue on your current medications as directed. Please refer to the Current Medication list given to you today. ? ?*If you need a refill on your cardiac medications before your next appointment, please call your pharmacy* ? ?Lab Work: ?NONE ordered at this time of appointment  ? ?If you have labs (blood work) drawn today and your tests are completely normal, you will receive your results only by: ?MyChart Message (if you have MyChart) OR ?A paper copy in the mail ?If you have any lab test that is abnormal or we need to change your treatment, we will call you to review the results. ? ?Testing/Procedures: ?NONE ordered at this time of appointment  ? ?Follow-Up: ?At [facility], you and your health needs are our priority.  As part of our continuing mission to provide you with exceptional heart care, we have created designated Provider Care Teams.  These Care Teams include your primary Cardiologist (physician) and Advanced Practice Providers (APPs -  Physician Assistants and Nurse Practitioners) who all work together to provide you with the care you need, when you need it. ? ?We recommend signing up for the patient portal called "MyChart".  Sign up information is provided on this After Visit Summary.  MyChart is used to connect with patients for Virtual Visits (Telemedicine).  Patients are able to view lab/test results, encounter notes, upcoming appointments, etc.  Non-urgent messages can be sent to your provider as well.   ?To learn more about what you can do with MyChart, go to [email or web address].   ? ?Your next appointment:   ?3 month(s) ? ?The format for your next appointment:   ?In Person ? ?Provider:   ?Sanda Klein, MD   ? ?Other Instructions ? ? ?

## 2021-05-31 ENCOUNTER — Encounter: Payer: Self-pay | Admitting: *Deleted

## 2021-05-31 ENCOUNTER — Other Ambulatory Visit: Payer: Self-pay

## 2021-05-31 ENCOUNTER — Other Ambulatory Visit: Payer: Self-pay | Admitting: *Deleted

## 2021-05-31 NOTE — Patient Outreach (Signed)
[place] [facility]) Care Management ?Telephonic RN Care Manager Note ? ? ?05/31/2021 ?Name:  Kathy Murphy MRN:  [id number] DOB:  1927-11-13 ? ?Summary: ?Successful follow up outreach ?She reports she is doing good today ? ?She states she has not had an update on the program Boost ?She states she would be able to go to any program only 1-2 days a week as she stays busy ?Cleaning lady come q thursdays ? ?She denies pain ?She has a good appetite ? ?She denies any falls in a "few months" ?She continues to have some issues with dizziness but reports a history of vertigo "since the fifties" ? ?She denies swelling, reports her weight is remaining stable at 175 lbs and confirms she is taking her diuretic as ordered  ? ?She is able to review with RN CM her upcoming appointments ?Pcp to be seen on 06/01/21 ?Unable to recall the provider she is to see at 1 pm on 06/01/21 ?Thurs 06/02/21 to see her audiologist about her hearing aid  ?4/27 eye MD [facility] [address] [facility] [address] ?06/25/21 she is to have her mammogram ?Dentist 06/20/21 [place] ? ? ?She has completed her [place] therapies ? ?Recommendations/Changes made from today's visit: ?Assessed for worsening symptoms for falls, , CHF, updated patient on outreach to [place] at cardiac rehab, boost program, outreach to Dr Sallyanne Kuster & Virgie Dad with Boost brochures sent to medical providers and pt ?Answered questions ?Encouraged her to review the Boost brochure mailed to her  ?THN progression reviewed  ? ? ?Subjective: ?Kathy Murphy is an 88 y.o. year old female who is a primary patient of Burnard Bunting, MD. The care management team was consulted for assistance with care management and/or care coordination needs.   ? ?Telephonic RN Care Manager completed Telephone Visit today.  ? ?Objective: ? ?Medications Reviewed Today   ? ? Reviewed by Barbaraann Faster, RN (Registered Nurse) on 05/31/21 at [place] List Status: <None>  ? ?Medication Order Taking? Sig Documenting Provider  Last Dose Status Informant  ?acetaminophen (TYLENOL) 650 MG CR tablet [id number] No Take 650 mg by mouth every 8 (eight) hours as needed for pain. [provider] Taking Active Self, Care Giver  ?anastrozole (ARIMIDEX) 1 MG tablet [id number] No Take 1 tablet (1 mg total) by mouth daily. Benay Pike, MD Taking Active Self, Care Giver  ?aspirin EC 81 MG tablet [id number] No Take 81 mg by mouth daily. [provider] Taking Active Self, Care Giver  ?Carboxymethylcellulose Sodium (REFRESH TEARS OP) [id number] No Apply 1-2 drops to eye daily. [provider] Taking Active Self, Care Giver  ?Cholecalciferol (VITAMIN D3) 125 MCG (5000 UT) CAPS [id number] No Take 1 capsule by mouth daily. [provider] Taking Active Self, Care Giver  ?diazepam (VALIUM) 2 MG tablet [id number] No Take 1 tablet (2 mg total) by mouth every 6 (six) hours as needed for anxiety. Hosie Poisson, MD Taking Active Self, Care Giver  ?isosorbide mononitrate (IMDUR) 30 MG 24 hr tablet [id number] No Take 1 tablet (30 mg total) by mouth daily. Croitoru, Mihai, MD Taking Active   ?levothyroxine (SYNTHROID, LEVOTHROID) 125 MCG tablet [id number] No Take 125 mcg by mouth daily before breakfast. [provider] Taking Active Self, Care Giver  ?metoprolol succinate (TOPROL-XL) 100 MG 24 hr tablet [id number] No TAKE 1 TABLET DAILY  ?Patient taking differently: Take 100 mg by mouth daily.  ? Croitoru, Mihai, MD Taking Active Self, Care Giver  ?nitroGLYCERIN (NITROSTAT) 0.4 MG SL tablet [id number] No  Place 1 tablet (0.4 mg total) under the tongue every 5 (five) minutes x 3 doses as needed for chest pain. Hosie Poisson, MD Taking Active Self, Care Giver  ?         ?Med Note [name] May 04, 2021  2:47 PM) Patient has on hand  ?pantoprazole (PROTONIX) 40 MG tablet [id number] No Take 40 mg by mouth daily. [provider] Taking Active Self, Care Giver  ?rosuvastatin (CRESTOR) 10 MG tablet [id number] No Take  10 mg by mouth every morning. [provider] Taking Active Self, Care Giver  ? ?  ?  ? ?  ? ? ? ?SDOH:  (Social Determinants of Health) assessments and interventions performed:  ?SDOH Interventions   ? ?Flowsheet Row Most Recent Value  ?SDOH Interventions   ?Housing Interventions Intervention Not Indicated  ?Intimate Partner Violence Interventions Intervention Not Indicated  ?Stress Interventions Intervention Not Indicated  ?Social Connections Interventions Intervention Not Indicated  ?Transportation Interventions Intervention Not Indicated  ? ?  ? ? ?Care Plan ? ?Review of patient past medical history, allergies, medications, health status, including review of consultants reports, laboratory and other test data, was performed as part of comprehensive evaluation for care management services.  ? ?Care Plan : RN Care Manager Plan of Care  ?Updates made by Barbaraann Faster, RN since 05/31/2021 12:00 AM  ?  ? ?Problem: Complex Care Coordination Needs and disease management in patient with CHF, CAD, UTI, Falls   ?Priority: High  ?  ? ?Long-Range Goal: Establish Plan of Care for Management Complex SDOH Barriers, disease management and Care Coordination Needs in patient with CHF, CAD, UTI, Falls   ?Start Date: 01/18/2021  ?This Visit's Progress: On track  ?Recent Progress: On track  ?Priority: High  ?Note:   ?Current Barriers:  ?Knowledge Deficits related to plan of care for management of CHF, CAD, and NSTEMI, Falls  ?Care Coordination needs related to Limited education about NSTEMI, CHF, CAD, Falls* ?02/21/21 active in home, community, church, strong support of friends ?Admission 3/1-3/23 NSTEMI ?Barrier: Hard of hearing (HOH), access to internet complications, understanding disease management, reports busy appointment schedule ? ?RN CM Clinical Goal(s):  ?Patient will verbalize basic understanding of  CHF, CAD, and NSTEMI, falls disease process and self health management plan for prevention of falls  through  collaboration with RN Care manager, provider, and care team.  ? ?Interventions: ?Provide home management education verbally and via mail as needed on medical conditions ?Inter-disciplinary care team collaboration (see longitudinal plan of care) ?Evaluation of current treatment plan related to  self management and patient's adherence to plan as established by provider ?02/22/21 Review of THN progression with discussion of transfer to [facility] disease management program She does discuss how busy she stays but is open to quarterly outreaches  ?05/17/21 Completion of post admission initial assessment after admission 3/1-3/23 for NSTEMI ? ?NSTEMI/CAD Interventions: Status on track yes Long term goal -MI on 05/11/21--05/31/21 denies worsening symptoms ?Assessed understanding of CAD diagnosis ?Medications reviewed including medications utilized in CAD treatment plan ?Discussed importance of daily weight and advised patient to weigh and record daily; ?Discussed the importance of keeping all appointments with provider; ?Assess for any worsening symptoms of CAD, chest pain, etc ?05/24/21 reviewed cardiology follow up appointment, medications, cardiac rehab ?Discussed cardiac rehab and possible postponing it until she believes she is able to tolerate the sessions and/or coordination of the schedule to fit her schedule- discuss outreach to cardiac rehab staff and MD ?  Encouraged rest  ?Discussed follow up outreach  ?Outreach to [facility] cardiac rehab Spoke with [place], Armed forces operational officer, BSN ?Cardiac and Pulmonary Rehab Nurse Navigator about pt concerns with cardiac rehab related to tolerance of 3 days a week, age, hx of vertigo, fall risk  [facility] discussed [facility] (balance oriented overall steadiness training) maybe a transitional option for the patient prior to cardiac rehab (this would require a MD referral/order to the program) Plus her cardiac rehab authorization would be available to patient for  months ?Messages (chat) sent to Dr Sallyanne Kuster and Virgie Dad PA with this information about Boost.(Included Boost brochure) ?Mailed pt a copy of the Boost (Balance oriented overall steadiness training) brochure  ?05/31/21 u

## 2021-06-01 DIAGNOSIS — Z1331 Encounter for screening for depression: Secondary | ICD-10-CM | POA: Diagnosis not present

## 2021-06-01 DIAGNOSIS — I10 Essential (primary) hypertension: Secondary | ICD-10-CM | POA: Diagnosis not present

## 2021-06-01 DIAGNOSIS — Z1339 Encounter for screening examination for other mental health and behavioral disorders: Secondary | ICD-10-CM | POA: Diagnosis not present

## 2021-06-01 DIAGNOSIS — K219 Gastro-esophageal reflux disease without esophagitis: Secondary | ICD-10-CM | POA: Diagnosis not present

## 2021-06-01 DIAGNOSIS — I2510 Atherosclerotic heart disease of native coronary artery without angina pectoris: Secondary | ICD-10-CM | POA: Diagnosis not present

## 2021-06-01 DIAGNOSIS — E663 Overweight: Secondary | ICD-10-CM | POA: Diagnosis not present

## 2021-06-01 DIAGNOSIS — R059 Cough, unspecified: Secondary | ICD-10-CM | POA: Diagnosis not present

## 2021-06-01 DIAGNOSIS — I5042 Chronic combined systolic (congestive) and diastolic (congestive) heart failure: Secondary | ICD-10-CM | POA: Diagnosis not present

## 2021-06-01 DIAGNOSIS — E785 Hyperlipidemia, unspecified: Secondary | ICD-10-CM | POA: Diagnosis not present

## 2021-06-01 DIAGNOSIS — Z0000 Encounter for general adult medical examination without abnormal findings: Secondary | ICD-10-CM | POA: Diagnosis not present

## 2021-06-01 DIAGNOSIS — I700 Atherosclerosis of aorta: Secondary | ICD-10-CM | POA: Diagnosis not present

## 2021-06-06 DIAGNOSIS — Z961 Presence of intraocular lens: Secondary | ICD-10-CM | POA: Diagnosis not present

## 2021-06-06 DIAGNOSIS — H5201 Hypermetropia, right eye: Secondary | ICD-10-CM | POA: Diagnosis not present

## 2021-06-06 DIAGNOSIS — H43811 Vitreous degeneration, right eye: Secondary | ICD-10-CM | POA: Diagnosis not present

## 2021-06-13 ENCOUNTER — Other Ambulatory Visit: Payer: Self-pay | Admitting: *Deleted

## 2021-06-13 NOTE — Patient Outreach (Signed)
[place] [facility]) Care Management ? ?06/13/2021 ? ?Louellen Molder ?1927-04-28 ?[id number] ? ?Successful telephone outreach call to patient in response to an e-mail this nurse received from [facility] explaining that the patient received a letter from this nurse and she is trying to return the letter. HIPAA identifiers obtained. Nurse discovered that the patient was concerned about returning information that is in a [employer] which she received in the hospital. Nurse discussed with the patient that there is no urgency about mailing back the documents in the packet; which eased the patient's concern. Nurse informed the patient that her [place] will be calling her on 06/28/21 and she could discuss the packet with her during the call. Patient was appreciative of the callback and had no further questions or concerns. ? ?Emelia Loron RN, BSN ?Nurse Health Coach ?[place] ?[phone] ?[email or web address] ? ? ? ?

## 2021-06-14 DIAGNOSIS — Z1231 Encounter for screening mammogram for malignant neoplasm of breast: Secondary | ICD-10-CM | POA: Diagnosis not present

## 2021-06-28 ENCOUNTER — Other Ambulatory Visit: Payer: Self-pay | Admitting: *Deleted

## 2021-06-28 ENCOUNTER — Encounter: Payer: Self-pay | Admitting: *Deleted

## 2021-06-28 NOTE — Patient Outreach (Signed)
[facility] Care Management ?Telephonic RN Care Manager Note ? ? ?06/28/2021 ?Name:  Kathy Murphy MRN:  [id number] DOB:  12/09/1927 ? ?Summary: ?06/13/21 RN CM received a message from [place] coach/disease manager, J Wine after patient attempted to locate [facility] staff to discuss completion of consent form in her [facility] welcome package. J Wine spoke with patient. Pt encouraged to return information when possible ? ?Follow up outreach successful  ?She reports she is doing fine but busy working on financial concerns ?She reports recent episodes of anxiety, chest fluttering, hot flashes about her financial concerns, care of her home and visiting friends ?She reports she had taken diazepam 2 mg every 6 hours prn with some success ?She has assist with care of her home maintenance on a weekly to bi weekly bases (Wednesdays) ?Neighbors call daily  ? ?  06/28/2021  ?  2:41 PM  ?GAD 7 : Generalized Anxiety Score  ?Nervous, Anxious, on Edge 1  ?Control/stop worrying 1  ?Worry too much - different things 1  ?Trouble relaxing 1  ?Restless 0  ?Easily annoyed or irritable 0  ?Afraid - awful might happen 0  ?Total GAD 7 Score 4  ?Anxiety Difficulty Somewhat difficult  ? ? ? ?She ventilated her feelings about various subjects  ?She reported appreciation of outreach & support from RN CM  ?Confirms she does not want my chart -reports daily difficulties with maintaining her e-mails, and difficulties with technology access ? ? ? ?Recommendations/Changes made from today's visit: ?Assessed for any worsening concerns  ?Suggest change in schedule conflict of home care providers ?Reviewed action plan for CAD/MI- encouraged to seek medical services for worsening symptoms ?Encouraged to consider a personal assistant services ?Allowed patient to ventilate her feelings- support and encouragement provided  ?Reminded her of access to RN CM and 24 hr nurse call center ?Updated per patient request in EPIC that she does not prefer my  chart access ? ?Subjective: ?Kathy Murphy is an 88 y.o. year old female who is a primary patient of Burnard Bunting, MD. The care management team was consulted for assistance with care management and/or care coordination needs.   ? ?Telephonic RN Care Manager completed Telephone Visit today.  ? ?Objective: ? ?Medications Reviewed Today   ? ? Reviewed by Barbaraann Faster, RN (Registered Nurse) on 05/31/21 at [place] List Status: <None>  ? ?Medication Order Taking? Sig Documenting Provider Last Dose Status Informant  ?acetaminophen (TYLENOL) 650 MG CR tablet [id number] No Take 650 mg by mouth every 8 (eight) hours as needed for pain. [provider] Taking Active Self, Care Giver  ?anastrozole (ARIMIDEX) 1 MG tablet [id number] No Take 1 tablet (1 mg total) by mouth daily. Benay Pike, MD Taking Active Self, Care Giver  ?aspirin EC 81 MG tablet [id number] No Take 81 mg by mouth daily. [provider] Taking Active Self, Care Giver  ?Carboxymethylcellulose Sodium (REFRESH TEARS OP) [id number] No Apply 1-2 drops to eye daily. [provider] Taking Active Self, Care Giver  ?Cholecalciferol (VITAMIN D3) 125 MCG (5000 UT) CAPS [id number] No Take 1 capsule by mouth daily. [provider] Taking Active Self, Care Giver  ?diazepam (VALIUM) 2 MG tablet [id number] No Take 1 tablet (2 mg total) by mouth every 6 (six) hours as needed for anxiety. Hosie Poisson, MD Taking Active Self, Care Giver  ?isosorbide mononitrate (IMDUR) 30 MG 24 hr tablet [id number] No Take 1 tablet (30 mg total) by mouth daily. Croitoru, Dani Gobble, MD  Taking Active   ?levothyroxine (SYNTHROID, LEVOTHROID) 125 MCG tablet [id number] No Take 125 mcg by mouth daily before breakfast. [provider] Taking Active Self, Care Giver  ?metoprolol succinate (TOPROL-XL) 100 MG 24 hr tablet [id number] No TAKE 1 TABLET DAILY  ?Patient taking differently: Take 100 mg by mouth daily.  ? Croitoru, Mihai, MD Taking Active Self,  Care Giver  ?nitroGLYCERIN (NITROSTAT) 0.4 MG SL tablet [id number] No Place 1 tablet (0.4 mg total) under the tongue every 5 (five) minutes x 3 doses as needed for chest pain. Hosie Poisson, MD Taking Active Self, Care Giver  ?         ?Med Note [name] May 04, 2021  2:47 PM) Patient has on hand  ?pantoprazole (PROTONIX) 40 MG tablet [id number] No Take 40 mg by mouth daily. [provider] Taking Active Self, Care Giver  ?rosuvastatin (CRESTOR) 10 MG tablet [id number] No Take 10 mg by mouth every morning. [provider] Taking Active Self, Care Giver  ? ?  ?  ? ?  ? ? ? ?SDOH:  (Social Determinants of Health) assessments and interventions performed:  ?SDOH Interventions   ? ?Flowsheet Row Most Recent Value  ?SDOH Interventions   ?Financial Strain Interventions Intervention Not Indicated  ?Housing Interventions Intervention Not Indicated  ?Stress Interventions Intervention Not Indicated  ?Social Connections Interventions Intervention Not Indicated  ?Transportation Interventions Intervention Not Indicated  ? ?  ? ? ?Care Plan ? ?Review of patient past medical history, allergies, medications, health status, including review of consultants reports, laboratory and other test data, was performed as part of comprehensive evaluation for care management services.  ? ?Care Plan : RN Care Manager Plan of Care  ?Updates made by Barbaraann Faster, RN since 06/28/2021 12:00 AM  ?  ? ?Problem: Complex Care Coordination Needs and disease management in patient with CHF, CAD, UTI, Falls   ?Priority: High  ?  ? ?Long-Range Goal: Establish Plan of Care for Management Complex SDOH Barriers, disease management and Care Coordination Needs in patient with CHF, CAD, UTI, Falls   ?Start Date: 01/18/2021  ?This Visit's Progress: On track  ?Recent Progress: On track  ?Priority: High  ?Note:   ?Current Barriers:  ?Knowledge Deficits related to plan of care for management of CHF, CAD, and NSTEMI, Falls  ?Care  Coordination needs related to Limited education about NSTEMI, CHF, CAD, Falls* ?02/21/21 active in home, community, church, strong support of friends ?Admission 3/1-3/23 NSTEMI ?Barrier: Hard of hearing (HOH), access to internet complications, understanding disease management, reports busy appointment schedule, Anxiety ? ?RN CM Clinical Goal(s):  ?Patient will verbalize basic understanding of  CHF, CAD, and NSTEMI, falls disease process and self health management plan for prevention of falls  through collaboration with RN Care manager, provider, and care team.  ? ?Interventions: ?Provide home management education verbally and via mail as needed on medical conditions ?Inter-disciplinary care team collaboration (see longitudinal plan of care) ?Evaluation of current treatment plan related to  self management and patient's adherence to plan as established by provider ?02/22/21 Review of [facility] progression with discussion of transfer to [facility] disease management program She does discuss how busy she stays but is open to quarterly outreaches  ?05/17/21 Completion of post admission initial assessment after admission 3/1-3/23 for NSTEMI ? ?NSTEMI/CAD Interventions: Status on track yes Long term goal -MI on 05/11/21--06/28/21 denies worsening symptoms ?Assessed understanding of CAD diagnosis ?Medications reviewed including medications utilized in CAD treatment plan ?Discussed importance of  daily weight and advised patient to weigh and record daily; ?Discussed the importance of keeping all appointments with provider; ?Assess for any worsening symptoms of CAD, chest pain, etc ?05/24/21 reviewed cardiology follow up appointment, medications, cardiac rehab ?Discussed cardiac rehab and possible postponing it until she believes she is able to tolerate the sessions and/or coordination of the schedule to fit her schedule- discuss outreach to cardiac rehab staff and MD ?Encouraged rest  ?Discussed follow up outreach  ?Outreach to [facility] cardiac  rehab Spoke with [place], Armed forces operational officer, BSN ?Cardiac and Pulmonary Rehab Nurse Navigator about pt concerns with cardiac rehab related to tolerance of 3 days a week, age, hx of vertigo, fall risk  Car[place] discussed co

## 2021-06-29 DIAGNOSIS — R928 Other abnormal and inconclusive findings on diagnostic imaging of breast: Secondary | ICD-10-CM | POA: Diagnosis not present

## 2021-06-29 DIAGNOSIS — R922 Inconclusive mammogram: Secondary | ICD-10-CM | POA: Diagnosis not present

## 2021-07-28 ENCOUNTER — Encounter: Payer: Self-pay | Admitting: *Deleted

## 2021-07-28 ENCOUNTER — Other Ambulatory Visit: Payer: Self-pay | Admitting: *Deleted

## 2021-07-28 NOTE — Patient Outreach (Signed)
[facility]) Care Management Telephonic RN Care Manager Note   08/15/2021 Name:  Kathy Murphy MRN:  [id number] DOB:  1928/02/10  Summary: Follow up with patient   Lower back/elbow pain Aleve is not relieving it She reports she has scheduled a visit to be evaluated for her elbow pain soon Wears insoles from [employer] store but not lately -foot has a rod in it right foot with a inner knot  been 10 years since surgery can not stand up bear foot Has seen various orthopedic providers  Does not sleep well as has to go to restroom frequently She does wear incontinence pads, She reports she is not on a diuretic. She does report some constipation related to intake of medications  Socialization Remaining social She confirms a Thursday 07/21/21 outing with her recent female visitor from [place] is available to visit for socialization  congestive Heart Failure (CHF) Some leg swelling with lots of walking     Recommendations/Changes made from today's visit: Assessed for care coordination and disease management needs  Discussed compression socks and places to obtain them like [employer] or [employer] (has friend there) Discuss prevention of UTIs plus s/s of UTIs to monitor for  Suggested constipation management home interventions Discussed possible back pain related to foot concerns  Discussed further outreach   Subjective: CHELCEY Murphy is an 88 y.o. year old female who is a primary patient of Burnard Bunting, MD. The care management team was consulted for assistance with care management and/or care coordination needs.    Telephonic RN Care Manager completed Telephone Visit today.   Objective:  Medications Reviewed Today     Reviewed by Barbaraann Faster, RN (Registered Nurse) on 07/28/21 at 1442  Med List Status: <None>   Medication Order Taking? Sig Documenting Provider Last Dose Status Informant  acetaminophen (TYLENOL) 650 MG CR tablet  [id number] No Take 650 mg by mouth every 8 (eight) hours as needed for pain. [provider] Taking Active Self, Care Giver  anastrozole (ARIMIDEX) 1 MG tablet [id number] No Take 1 tablet (1 mg total) by mouth daily. Benay Pike, MD Taking Active Self, Care Giver  aspirin EC 81 MG tablet [id number] No Take 81 mg by mouth daily. [provider] Taking Active Self, Care Giver  Carboxymethylcellulose Sodium (REFRESH TEARS OP) [id number] No Apply 1-2 drops to eye daily. [provider] Taking Active Self, Care Giver  Cholecalciferol (VITAMIN D3) 125 MCG (5000 UT) CAPS [id number] No Take 1 capsule by mouth daily. [provider] Taking Active Self, Care Giver  diazepam (VALIUM) 2 MG tablet [id number] No Take 1 tablet (2 mg total) by mouth every 6 (six) hours as needed for anxiety. Hosie Poisson, MD Taking Active Self, Care Giver  Glycerin-Polysorbate 80 [facility] DRY EYE THERAPY OP) [id number]   [provider]  Active   isosorbide mononitrate (IMDUR) 30 MG 24 hr tablet [id number] No Take 1 tablet (30 mg total) by mouth daily. Croitoru, Mihai, MD Taking Active   levothyroxine (SYNTHROID, LEVOTHROID) 125 MCG tablet [id number] No Take 125 mcg by mouth daily before breakfast. [provider] Taking Active Self, Care Giver  metoprolol succinate (TOPROL-XL) 100 MG 24 hr tablet [id number] No TAKE 1 TABLET DAILY  Patient taking differently: Take 100 mg by mouth daily.   Croitoru, Mihai, MD Taking Active Self, Care Giver  nitroGLYCERIN (NITROSTAT) 0.4 MG SL tablet [id number] No Place 1 tablet (0.4 mg total) under the tongue every 5 (five)  minutes x 3 doses as needed for chest pain. Hosie Poisson, MD Taking Active Self, Care Giver           Med Note [name] May 04, 2021  2:47 PM) Patient has on hand  pantoprazole (PROTONIX) 40 MG tablet [id number] No Take 40 mg by mouth daily. [provider] Taking Active Self, Care Giver  rosuvastatin (CRESTOR)  10 MG tablet [id number] No Take 10 mg by mouth every morning. [provider] Taking Active Self, Care Giver             SDOH:  (Social Determinants of Health) assessments and interventions performed:  SDOH Interventions    Flowsheet Row Most Recent Value  SDOH Interventions   Food Insecurity Interventions Intervention Not Indicated  Physical Activity Interventions Other (Comments)  [pt reports not wanting  further rehab was offered cardiac rehab Had PT but c/o back pain]  Social Connections Interventions Intervention Not Indicated  Transportation Interventions Intervention Not Indicated       Care Plan  Review of patient past medical history, allergies, medications, health status, including review of consultants reports, laboratory and other test data, was performed as part of comprehensive evaluation for care management services.   Care Plan : RN Care Manager Plan of Care  Updates made by Barbaraann Faster, RN since 08/15/2021 12:00 AM     Problem: Complex Care Coordination Needs and disease management in patient with CHF, CAD, UTI, Falls   Priority: High     Long-Range Goal: Establish Plan of Care for Management Complex SDOH Barriers, disease management and Care Coordination Needs in patient with CHF, CAD, UTI, Falls   Start Date: 01/18/2021  This Visit's Progress: On track  Recent Progress: On track  Priority: High  Note:   Current Barriers:  Knowledge Deficits related to plan of care for management of CHF, CAD, and NSTEMI, Falls  Care Coordination needs related to Limited education about NSTEMI, CHF, CAD, Falls* 02/21/21 active in home, community, church, strong support of friends Admission 3/1-3/23 NSTEMI Barrier: Hard of hearing (HOH), access to internet complications, decrease understanding disease management, reports busy appointment schedule, Anxiety  RN CM Clinical Goal(s):  Patient will verbalize basic understanding of  CHF, CAD, and NSTEMI, falls  disease process and self health management plan for prevention of falls, CHF, CAD, NSTEMI  through collaboration with RN Care manager, provider, and care team.   Interventions: Provide home management education verbally and via mail as needed on medical conditions Inter-disciplinary care team collaboration (see longitudinal plan of care) Evaluation of current treatment plan related to  self management and patient's adherence to plan as established by provider 02/22/21 Review of [facility] progression with discussion of transfer to [facility] disease management program She does discuss how busy she stays but is open to quarterly outreaches vs disease management program 05/17/21 Completion of post admission initial assessment after admission 3/1-3/23 for NSTEMI  NSTEMI/CAD Interventions: Status on track yes Long term goal -MI on 05/11/21 07/28/21 denies worsening symptoms- continues to notice some swelling with lots of walking Assessed understanding of CAD diagnosis Medications reviewed including medications utilized in CAD treatment plan Discussed importance of daily weight and advised patient to weigh and record daily; Discussed the importance of keeping all appointments with provider; Assess for any worsening symptoms of CAD, chest pain, etc 05/24/21 reviewed cardiology follow up appointment, medications, cardiac rehab Discussed cardiac rehab and possible postponing it until she believes she is able to tolerate the sessions  and/or coordination of the schedule to fit her schedule- discuss outreach to cardiac rehab staff and MD Encouraged rest  Discussed follow up outreach  [facility] Spoke with Maurice Small RN, BSN Cardiac and Pulmonary Rehab Nurse Navigator about pt concerns with cardiac rehab related to tolerance of 3 days a week, age, hx of vertigo, fall risk  [facility] discussed [facility] (balance oriented overall steadiness training) maybe a transitional  option for the patient prior to cardiac rehab (this would require a MD referral/order to the program) Plus her cardiac rehab authorization would be available to patient for months  Messages (chat) sent to Dr Sallyanne Kuster and Virgie Dad PA with this information about [facility].(Included [facility] brochure) Mailed pt a copy of the [facility] (Balance oriented overall steadiness training) brochure  05/31/21 updated patient on outreach to [place] at cardiac rehab, boost program, outreach to Dr Sallyanne Kuster & Virgie Dad with [facility] brochures sent to medical providers and pt 07/28/21 Discussed compression socks and places to obtain them like [employer] or [employer] (has friend there)  Heart Failure Interventions: Status on track yes Long term goal  07/28/21 remains with some shortness of breath with exertion,  continues to notice some swelling with lots of walking Basic overview and discussion of pathophysiology of Heart Failure reviewed Discussed importance of daily weight and advised patient to weigh and record daily Reviewed role of diuretics in prevention of fluid overload and management of heart failure; Discussed the importance of keeping all appointments with provider Screening for signs and symptoms of depression related to chronic disease state  Assessed social determinant of health barriers  Assess for any worsening symptoms of CHF 07/28/21 Discussed compression socks and places to obtain them like [employer] or [employer] (has friend there)  Falls Interventions: Status Goal Met 07/28/21 short term goal  07/28/21 denies fall recently "not since last year" (2022)  Advised patient of importance of notifying provider of falls Assessed for falls since last encounter   Pain Interventions:  (Status:  New goal. 07/28/21) Short Term Goal 07/28/21 Aleve is not relieving it She reports she has scheduled a visit to be evaluated for her elbow pain soon Wears insoles from [employer] but not lately  -foot has a rod in it right foot with a inner knot  been 10 years since surgery can not stand up bear foot Has seen various orthopedic providers Pain assessment performed Medications reviewed Discussed importance of adherence to all scheduled medical appointments Counseled on the importance of reporting any/all new or changed pain symptoms or management strategies to pain management provider Advised patient to report to care team affect of pain on daily activities Discussed use of relaxation techniques and/or diversional activities to assist with pain reduction (distraction, imagery, relaxation, massage, acupressure, TENS, heat, and cold application Reviewed with patient prescribed pharmacological and nonpharmacological pain relief strategies Screening for signs and symptoms of depression related to chronic disease state  Assessed social determinant of health barriers 07/28/21 Discussed possible back pain related to foot concerns    Patient Goals/Self-Care Activities: Patient will self administer medications as prescribed Patient will attend all scheduled provider appointments Patient will continue to perform ADL's independently Patient will continue to perform IADL's independently Patient will call provider office for new concerns or questions Patient will call RN CM to assist with advocating for her prn for worsening symptoms related to UTIs  Follow Up Plan:  The patient has been provided with contact information for the  care management team and has been advised to call with any health related questions or concerns.  The care management team will reach out to the patient again over the next 30+ business  days.       Plan: The patient has been provided with contact information for the care management team and has been advised to call with any health related questions or concerns.  The care management team will reach out to the patient again over the next 30+ business days.  Alichia Alridge L.  Lavina Hamman, RN, BSN, [place] Coordinator Office number [phone] Main [facility] number [phone] Fax number [phone]

## 2021-07-29 DIAGNOSIS — W19XXXA Unspecified fall, initial encounter: Secondary | ICD-10-CM | POA: Diagnosis not present

## 2021-07-29 DIAGNOSIS — I10 Essential (primary) hypertension: Secondary | ICD-10-CM | POA: Diagnosis not present

## 2021-08-22 ENCOUNTER — Other Ambulatory Visit: Payer: Self-pay

## 2021-08-22 ENCOUNTER — Telehealth: Payer: Self-pay

## 2021-08-22 MED ORDER — ANASTROZOLE 1 MG PO TABS
1.0000 mg | ORAL_TABLET | Freq: Every day | ORAL | 0 refills | Status: DC
Start: 2021-08-22 — End: 2022-02-21

## 2021-08-22 NOTE — Telephone Encounter (Signed)
Pt called requesting a 30 day refill on her Anastrozole.  Pt stated she has a follow-up appt with Dr. Chryl Heck next month at which time she will have completed her years of Anastrozole.  Pt stated she has her refills sent to [facility].  Pt stated she usually get a 90 day supply but since she will be coming off Anastrozole next month pt said she only need a 30day supply.  Sent Pt Call message to Dr. Chryl Heck and Erskine Emery, RN regarding pt's refill request.

## 2021-09-15 ENCOUNTER — Other Ambulatory Visit: Payer: Self-pay | Admitting: *Deleted

## 2021-09-15 NOTE — Patient Outreach (Signed)
[facility]) Care Management Telephonic RN Care Manager Note   09/15/2021 Name:  KENZEY BIRKLAND MRN:  [id number] DOB:  06/21/1927  Summary: Successful outreach Remains social Denies medical needs at this time   Recommendations/Changes made from today's visit: Follow up outreach to follow up on home management progression  Discussed case closure and patient agrees to case closure with the option to outreach to RN CM prn   Subjective: ROSABELL Kathy Murphy is an 88 y.o. year old female who is a primary patient of Burnard Bunting, MD. The care management team was consulted for assistance with care management and/or care coordination needs.    Telephonic RN Care Manager completed Telephone Visit today.   Objective:  Medications Reviewed Today     Reviewed by Barbaraann Faster, RN (Registered Nurse) on 07/28/21 at 1442  Med List Status: <None>   Medication Order Taking? Sig Documenting Provider Last Dose Status Informant  acetaminophen (TYLENOL) 650 MG CR tablet [id number] No Take 650 mg by mouth every 8 (eight) hours as needed for pain. [provider] Taking Active Self, Care Giver  anastrozole (ARIMIDEX) 1 MG tablet [id number] No Take 1 tablet (1 mg total) by mouth daily. Benay Pike, MD Taking Active Self, Care Giver  aspirin EC 81 MG tablet [id number] No Take 81 mg by mouth daily. [provider] Taking Active Self, Care Giver  Carboxymethylcellulose Sodium (REFRESH TEARS OP) [id number] No Apply 1-2 drops to eye daily. [provider] Taking Active Self, Care Giver  Cholecalciferol (VITAMIN D3) 125 MCG (5000 UT) CAPS [id number] No Take 1 capsule by mouth daily. [provider] Taking Active Self, Care Giver  diazepam (VALIUM) 2 MG tablet [id number] No Take 1 tablet (2 mg total) by mouth every 6 (six) hours as needed for anxiety. Hosie Poisson, MD Taking Active Self, Care Giver  Glycerin-Polysorbate 80 [facility] DRY EYE THERAPY OP) [id number]    [provider]  Active   isosorbide mononitrate (IMDUR) 30 MG 24 hr tablet [id number] No Take 1 tablet (30 mg total) by mouth daily. Croitoru, Mihai, MD Taking Active   levothyroxine (SYNTHROID, LEVOTHROID) 125 MCG tablet [id number] No Take 125 mcg by mouth daily before breakfast. [provider] Taking Active Self, Care Giver  metoprolol succinate (TOPROL-XL) 100 MG 24 hr tablet [id number] No TAKE 1 TABLET DAILY  Patient taking differently: Take 100 mg by mouth daily.   Croitoru, Mihai, MD Taking Active Self, Care Giver  nitroGLYCERIN (NITROSTAT) 0.4 MG SL tablet [id number] No Place 1 tablet (0.4 mg total) under the tongue every 5 (five) minutes x 3 doses as needed for chest pain. Hosie Poisson, MD Taking Active Self, Care Giver           Med Note [name] May 04, 2021  2:47 PM) Patient has on hand  pantoprazole (PROTONIX) 40 MG tablet [id number] No Take 40 mg by mouth daily. [provider] Taking Active Self, Care Giver  rosuvastatin (CRESTOR) 10 MG tablet [id number] No Take 10 mg by mouth every morning. [provider] Taking Active Self, Care Giver             SDOH:  (Social Determinants of Health) assessments and interventions performed:    Care Plan  Review of patient past medical history, allergies, medications, health status, including review of consultants reports, laboratory and other test data, was performed as part of comprehensive evaluation for care management services.   Care Plan : RN Care Manager Plan  of Care  Updates made by Barbaraann Faster, RN since 10/17/2021 12:00 AM  Completed 10/17/2021   Problem: Complex Care Coordination Needs and disease management in patient with CHF, CAD, UTI, Falls Resolved 09/15/2021  Priority: High     Long-Range Goal: Establish Plan of Care for Management Complex SDOH Barriers, disease management and Care Coordination Needs in patient with CHF, CAD, UTI, Falls Completed 09/15/2021  Start Date:  01/18/2021  Recent Progress: On track  Priority: High  Note:   Current Barriers:  Knowledge Deficits related to plan of care for management of CHF, CAD, and NSTEMI, Falls  Care Coordination needs related to Limited education about NSTEMI, CHF, CAD, Falls* 02/21/21 active in home, community, church, strong support of friends Admission 3/1-3/23 NSTEMI Barrier: Hard of hearing (HOH), access to internet complications, decrease understanding disease management, reports busy appointment schedule, Anxiety  RN CM Clinical Goal(s):  Patient will verbalize basic understanding of  CHF, CAD, and NSTEMI, falls disease process and self health management plan for prevention of falls, CHF, CAD, NSTEMI  through collaboration with RN Care manager, provider, and care team.   Interventions: Provide home management education verbally and via mail as needed on medical conditions Inter-disciplinary care team collaboration (see longitudinal plan of care) Evaluation of current treatment plan related to  self management and patient's adherence to plan as established by provider 02/22/21 Review of [facility] progression with discussion of transfer to [facility] disease management program She does discuss how busy she stays but is open to quarterly outreaches vs disease management program 05/17/21 Completion of post admission initial assessment after admission 3/1-3/23 for NSTEMI 09/15/21 Discussed case closure and patient agrees to case closure with the option to outreach to RN CM prn   NSTEMI/CAD Interventions: Status on track yes Long term goal -MI on 05/11/21 09/15/21 denies concerns 07/28/21 denies worsening symptoms- continues to notice some swelling with lots of walking Assessed understanding of CAD diagnosis Medications reviewed including medications utilized in CAD treatment plan Discussed importance of daily weight and advised patient to weigh and record daily; Discussed the importance of keeping all appointments with  provider; Assess for any worsening symptoms of CAD, chest pain, etc 05/24/21 reviewed cardiology follow up appointment, medications, cardiac rehab Discussed cardiac rehab and possible postponing it until she believes she is able to tolerate the sessions and/or coordination of the schedule to fit her schedule- discuss outreach to cardiac rehab staff and MD Encouraged rest  Discussed follow up outreach  [facility] Spoke with Maurice Small RN, BSN Cardiac and Pulmonary Rehab Nurse Navigator about pt concerns with cardiac rehab related to tolerance of 3 days a week, age, hx of vertigo, fall risk  [facility] discussed [facility] (balance oriented overall steadiness training) maybe a transitional option for the patient prior to cardiac rehab (this would require a MD referral/order to the program) Plus her cardiac rehab authorization would be available to patient for months  Messages (chat) sent to Dr Sallyanne Kuster and Virgie Dad PA with this information about Boost.(Included Boost brochure) Mailed pt a copy of the Boost (Balance oriented overall steadiness training) brochure  05/31/21 updated patient on outreach to [place] at cardiac rehab, boost program, outreach to Dr Sallyanne Kuster & Virgie Dad with Boost brochures sent to medical providers and pt 07/28/21 Discussed compression socks and places to obtain them like [employer] or [employer] (has friend there)  Heart Failure Interventions: Status on track yes Long term goal  09/15/21 denies concerns 07/28/21  remains with some shortness of breath with exertion,  continues to notice some swelling with lots of walking Basic overview and discussion of pathophysiology of Heart Failure reviewed Discussed importance of daily weight and advised patient to weigh and record daily Reviewed role of diuretics in prevention of fluid overload and management of heart failure; Discussed the importance of keeping all  appointments with provider Screening for signs and symptoms of depression related to chronic disease state  Assessed social determinant of health barriers  Assess for any worsening symptoms of CHF 07/28/21 Discussed compression socks and places to obtain them like [employer] or [employer] (has friend there)  Falls Interventions: Status Goal Met 07/28/21 short term goal  07/28/21 denies fall recently "not since last year" (2022)  Advised patient of importance of notifying provider of falls Assessed for falls since last encounter   Pain Interventions:  (Status:  Goal Met. 07/28/21) Short Term Goal 09/6021 denies concerns 07/28/21 Aleve is not relieving it She reports she has scheduled a visit to be evaluated for her elbow pain soon Wears insoles from good feet store but not lately -foot has a rod in it right foot with a inner knot  been 10 years since surgery can not stand up bear foot Has seen various orthopedic providers Pain assessment performed Medications reviewed Discussed importance of adherence to all scheduled medical appointments Counseled on the importance of reporting any/all new or changed pain symptoms or management strategies to pain management provider Advised patient to report to care team affect of pain on daily activities Discussed use of relaxation techniques and/or diversional activities to assist with pain reduction (distraction, imagery, relaxation, massage, acupressure, TENS, heat, and cold application Reviewed with patient prescribed pharmacological and nonpharmacological pain relief strategies Screening for signs and symptoms of depression related to chronic disease state  Assessed social determinant of health barriers 07/28/21 Discussed possible back pain related to foot concerns    Patient Goals/Self-Care Activities: Patient will self administer medications as prescribed Patient will attend all scheduled provider appointments Patient will continue to  perform ADL's independently Patient will continue to perform IADL's independently Patient will call provider office for new concerns or questions Patient will call RN CM to assist with advocating for her prn for worsening symptoms related to UTIs  Follow Up Plan:  The patient has been provided with contact information for the care management team and has been advised to call with any health related questions or concerns.  Discussed case closure and patient agrees to case closure with the option to outreach to RN CM prn          Plan: The patient has been provided with contact information for the care management team and has been advised to call with any health related questions or concerns.  Case closure with option to outreach to RN CM prn   Viktoria Gruetzmacher L. Lavina Hamman, RN, BSN, [place] Coordinator Office number [phone] [facility] number [phone] Fax number [phone]

## 2021-09-22 ENCOUNTER — Encounter: Payer: Self-pay | Admitting: Hematology and Oncology

## 2021-09-22 ENCOUNTER — Inpatient Hospital Stay: Payer: Medicare Other | Attending: Hematology and Oncology | Admitting: Hematology and Oncology

## 2021-09-22 ENCOUNTER — Other Ambulatory Visit: Payer: Self-pay

## 2021-09-22 VITALS — BP 157/81 | HR 82 | Temp 97.9°F | Resp 17 | Wt 173.3 lb

## 2021-09-22 DIAGNOSIS — M8580 Other specified disorders of bone density and structure, unspecified site: Secondary | ICD-10-CM

## 2021-09-22 DIAGNOSIS — Z170 Estrogen receptor positive status [ER+]: Secondary | ICD-10-CM | POA: Diagnosis not present

## 2021-09-22 DIAGNOSIS — Z79811 Long term (current) use of aromatase inhibitors: Secondary | ICD-10-CM | POA: Insufficient documentation

## 2021-09-22 DIAGNOSIS — C50411 Malignant neoplasm of upper-outer quadrant of right female breast: Secondary | ICD-10-CM | POA: Insufficient documentation

## 2021-09-22 NOTE — Progress Notes (Signed)
Kathy Murphy  [phone]) [phone] Fax:(336) [phone]     ID: Kathy Murphy DOB: 05/17/1927  MR#: [id number]  INO#:[id number]  Patient Care Team: Burnard Bunting, MD as PCP - General (Internal Medicine) Sanda Klein, MD as PCP - Cardiology (Cardiology) Magrinat, Virgie Dad, MD (Inactive) as Consulting Physician (Oncology) Rolm Bookbinder, MD as Consulting Physician (General Surgery) Croitoru, Dani Gobble, MD as Consulting Physician (Cardiology) Netta Cedars, MD as Consulting Physician (Orthopedic Surgery) Barbaraann Faster, RN as [place], [place], PA-C as Physician Assistant (Physician Assistant) Jola Schmidt, MD as Consulting Physician (Ophthalmology) OTHER MD:  CHIEF COMPLAINT: Estrogen receptor positive invasive lobular breast cancer  CURRENT TREATMENT: Anastrozole   INTERVAL HISTORY:  Kathy Murphy was scheduled today for follow-up of her estrogen receptor positive breast cancer.  She is here with her friend today.  She has been tolerating anastrozole very well.  No complaints.  She has her mammogram done at [facility]. She had a fall, had a hematoma on her head which has resolved since her last visit.  She did not want to go to the emergency room.  She denies any other complaints today  REVIEW OF SYSTEMS: Rest of the pertinent 10 point ROS reviewed and negative.   BREAST CANCER HISTORY: From the original intake note:  Kathy Murphy had a change in her right breast and was referred to [facility] where on 04/14/2016 bilateral diagnostic mammography with tomography and right breast ultrasound was obtained. The breast density was category B. In the central right breast there was an oval mass with no other findings of concern. Ultrasound located a benign 0.3 cm cyst in the upper inner quadrant of the right breast correlating with the mammography findings. Routine mammography was recommended for one year.  However with further changes in the  right breast note by the patient and her primary care MD, repeat right diagnostic mammography with tomography and repeat right breast ultrasonography 08/21/2016 now found a 3 cm area of asymmetry in the upper outer right breast which on physical exam measured approximately 2-1/2 cm at the 10:00 location 8 cm from the nipple. Ultrasound of this area showed a 0.5 cm hypoechoic mass with a larger 3 cm area of hazy isoechoic tissue with abnormal architecture. The right axilla showed 2 normal-appearing lymph nodes.  Biopsy of the right breast upper outer quadrant mass 08/28/2016 showed ([id number]) an invasive lobular carcinoma, E-cadherin negative, estrogen receptor 85% positive, progesterone receptor 100% positive, both with strong staining intensity, with an MIB-1 of 5%, and no HER-2 implication, the signals ratio being 1.49 and the number per cell 3.21.  The patient's subsequent history is as detailed below.   PAST MEDICAL HISTORY: Past Medical History:  Diagnosis Date   Arthritis    Breast cancer ([place])    Right   CAD (coronary artery disease)    Cancer (HCC)    thyroid   Chronic combined systolic and diastolic CHF (congestive heart failure) (HCC)    Dilated aortic root (HCC)    First degree AV block    GERD (gastroesophageal reflux disease)    GI bleed 1980   AFTER POLYP EXCISION   Heart disease, hypertensive    Hypercholesterolemia    Hypertension    Hypothyroidism    Incontinence of urine    Myalgia    Myocardial infarction ([place])    Obesity    Pain in joints    Retinal detachment    Unsteady gait    UTI (urinary tract infection)  Vision loss of left eye    Vomiting     PAST SURGICAL HISTORY: Past Surgical History:  Procedure Laterality Date   ABDOMINAL HYSTERECTOMY  1977   BREAST BIOPSY     BREAST LUMPECTOMY     x5   CARDIAC CATHETERIZATION N/A 02/08/2016   Procedure: Left Heart Cath and Coronary Angiography;  Surgeon: Belva Crome, MD;  Location: [place] [facility];  Service: Cardiovascular;  Laterality: N/A;   CARDIAC CATHETERIZATION N/A 02/11/2016   Procedure: Coronary Stent Intervention;  Surgeon: Belva Crome, MD;  Location: [place] [facility];  Service: Cardiovascular;  Laterality: N/A;   CATARACT EXTRACTION, BILATERAL     CERVICAL LAMINECTOMY     DENTAL SURGERY  1952   EVACUATION BREAST HEMATOMA Right 01/09/2017   Procedure: EVACUATION RIGHT MASTECTOMY FLUID COLLECTION;  Surgeon: Rolm Bookbinder, MD;  Location: [place];  Service: General;  Laterality: Right;   [place]  05-08-2010   left   TOTAL KNEE ARTHROPLASTY Right 05/18/2017   Procedure: RIGHT TOTAL KNEE ARTHROPLASTY;  Surgeon: Netta Cedars, MD;  Location: [place];  Service: Orthopedics;  Laterality: Right;   TOTAL MASTECTOMY Right 11/27/2016   TOTAL MASTECTOMY Right 11/27/2016   Procedure: RIGHT TOTAL MASTECTOMY;  Surgeon: Rolm Bookbinder, MD;  Location: [place];  Service: General;  Laterality: Right;    FAMILY HISTORY Family History  Problem Relation Age of Onset   Heart disease Mother   The patient has little information regarding her father. Her mother died at age 44 in a nursing home from what the patient thinks may have been heart disease. The patient had no brothers and no sisters.   GYNECOLOGIC HISTORY:  No LMP recorded. Patient is postmenopausal. She thinks her first menstrual period may have been age 75. She never carried a child to term. She underwent hysterectomy with bilateral salpingo-oophorectomy in her 33s. She was on estrogen replacement until July 2018   SOCIAL HISTORY:  She is a retired Glass blower/designer for [employer]. Her husband died in 05/08/12. She lives by herself, with no pets, in a fairly large house on [place] acres she says. She does much of the house work herself. She also pays all her bills and keeps all her accounts.     ADVANCED DIRECTIVES: In place. She has named her close friend [name] as her healthcare power of attorney. He can be reached at (cell) [phone] or (home) [place]: Social History   Tobacco Use   Smoking status: Never   Smokeless tobacco: Never  Vaping Use   Vaping Use: Never used  Substance Use Topics   Alcohol use: No   Drug use: No     Colonoscopy: November 2011/[facility]  PAP: May 08, 2014  Bone density: 10/04/2016 [facility] showed a T score of -1.4 osteopenic   Allergies  Allergen Reactions   Azithromycin     Other reaction(s): Unknown Other reaction(s): Unknown   Other Other (See Comments)    SOME OF THE "MYCINS"...UNKNOWN REACTION.   Adhesive [Tape] Rash   Latex Rash    Current Outpatient Medications  Medication Sig Dispense Refill   acetaminophen (TYLENOL) 650 MG CR tablet Take 650 mg by mouth every 8 (eight) hours as needed for pain.     anastrozole (ARIMIDEX) 1 MG tablet Take 1 tablet (1 mg total) by mouth daily. 30 tablet 0   aspirin  EC 81 MG tablet Take 81 mg by mouth daily.     Carboxymethylcellulose Sodium (REFRESH TEARS OP) Apply 1-2 drops to eye daily.     Cholecalciferol (VITAMIN D3) 125 MCG (5000 UT) CAPS Take 1 capsule by mouth daily.     diazepam (VALIUM) 2 MG tablet Take 1 tablet (2 mg total) by mouth every 6 (six) hours as needed for anxiety. 2 tablet 0   Glycerin-Polysorbate 80 (REFRESH DRY EYE THERAPY OP)      isosorbide mononitrate (IMDUR) 30 MG 24 hr tablet Take 1 tablet (30 mg total) by mouth daily. 90 tablet 3   levothyroxine (SYNTHROID, LEVOTHROID) 125 MCG tablet Take 125 mcg by mouth daily before breakfast.     metoprolol succinate (TOPROL-XL) 100 MG 24 hr tablet TAKE 1 TABLET DAILY (Patient taking differently: Take 100 mg by mouth daily.) 90 tablet 3   nitroGLYCERIN (NITROSTAT) 0.4 MG SL tablet Place 1 tablet (0.4 mg total) under the tongue every 5 (five) minutes x 3 doses as needed for chest pain. 30 tablet 12   pantoprazole (PROTONIX) 40 MG tablet Take 40 mg by mouth daily.      rosuvastatin (CRESTOR) 10 MG tablet Take 10 mg by mouth every morning.     No current facility-administered medications for this visit.    OBJECTIVE:   There were no vitals filed for this visit.    There is no height or weight on file to calculate BMI.   Wt Readings from Last 3 Encounters:  05/24/21 175 lb (79.4 kg)  05/13/21 172 lb 6.4 oz (78.2 kg)  05/10/21 175 lb 12.8 oz (79.7 kg)    Physical Exam Constitutional:      Appearance: Normal appearance.  Cardiovascular:     Rate and Rhythm: Normal rate and regular rhythm.  Chest:     Comments: Bilateral breasts inspected.  Right mastectomy scar appears to have no concern for recurrence.  Left breast inspected and palpated.  No regional adenopathy or palpable masses.  No change compared to last exam Musculoskeletal:     Cervical back: Normal range of motion and neck supple. No rigidity.  Lymphadenopathy:     Cervical: No cervical adenopathy.  Neurological:     Mental Status: She is alert.      LAB RESULTS:  CMP     Component Value Date/Time   NA 135 05/13/2021 0058   NA 140 01/22/2017 0946   K 3.4 (L) 05/13/2021 0058   K 4.1 01/22/2017 0946   CL 102 05/13/2021 0058   CO2 25 05/13/2021 0058   CO2 29 01/22/2017 0946   GLUCOSE 102 (H) 05/13/2021 0058   GLUCOSE 101 01/22/2017 0946   BUN 24 (H) 05/13/2021 0058   BUN 17.9 01/22/2017 0946   CREATININE 0.99 05/13/2021 0058   CREATININE 0.8 01/22/2017 0946   CALCIUM 9.0 05/13/2021 0058   CALCIUM 9.1 01/22/2017 0946   PROT 6.7 12/11/2020 2313   PROT 6.5 01/22/2017 0946   ALBUMIN 2.8 (L) 12/15/2020 0346   ALBUMIN 3.1 (L) 01/22/2017 0946   AST 15 12/11/2020 2313   AST 10 01/22/2017 0946   ALT 11 12/11/2020 2313   ALT 12 01/22/2017 0946   ALKPHOS 83 12/11/2020 2313   ALKPHOS 92 01/22/2017 0946   BILITOT 0.9 12/11/2020 2313   BILITOT 0.37 01/22/2017 0946   GFRNONAA 54 (L) 05/13/2021 0058   GFRAA 57 (L) 01/01/2019 1241    No results found for: "TOTALPROTELP",  "ALBUMINELP", "A1GS", "A2GS", "BETS", "BETA2SER", "GAMS", "MSPIKE", "SPEI"  No results found for: "KPAFRELGTCHN", "LAMBDASER", "KAPLAMBRATIO"  Lab Results  Component Value Date   WBC 5.9 05/13/2021   NEUTROABS 4.4 01/01/2019   HGB 12.5 05/13/2021   HCT 39.0 05/13/2021   MCV 94.7 05/13/2021   PLT 127 (L) 05/13/2021      Chemistry      Component Value Date/Time   NA 135 05/13/2021 0058   NA 140 01/22/2017 0946   K 3.4 (L) 05/13/2021 0058   K 4.1 01/22/2017 0946   CL 102 05/13/2021 0058   CO2 25 05/13/2021 0058   CO2 29 01/22/2017 0946   BUN 24 (H) 05/13/2021 0058   BUN 17.9 01/22/2017 0946   CREATININE 0.99 05/13/2021 0058   CREATININE 0.8 01/22/2017 0946      Component Value Date/Time   CALCIUM 9.0 05/13/2021 0058   CALCIUM 9.1 01/22/2017 0946   ALKPHOS 83 12/11/2020 2313   ALKPHOS 92 01/22/2017 0946   AST 15 12/11/2020 2313   AST 10 01/22/2017 0946   ALT 11 12/11/2020 2313   ALT 12 01/22/2017 0946   BILITOT 0.9 12/11/2020 2313   BILITOT 0.37 01/22/2017 0946       No results found for: "LABCA2"  No components found for: "GYFVCB449"  No results for input(s): "INR" in the last 168 hours.  Urinalysis    Component Value Date/Time   COLORURINE AMBER (A) 12/12/2020 2337   APPEARANCEUR CLOUDY (A) 12/12/2020 2337   LABSPEC 1.018 12/12/2020 2337   PHURINE 5.0 12/12/2020 2337   GLUCOSEU 50 (A) 12/12/2020 2337   HGBUR LARGE (A) 12/12/2020 2337   BILIRUBINUR NEGATIVE 12/12/2020 2337   KETONESUR NEGATIVE 12/12/2020 2337   PROTEINUR 100 (A) 12/12/2020 2337   UROBILINOGEN 0.2 12/26/2012 0843   NITRITE POSITIVE (A) 12/12/2020 2337   LEUKOCYTESUR LARGE (A) 12/12/2020 2337    STUDIES:  No results found.   ELIGIBLE FOR AVAILABLE RESEARCH PROTOCOL: No  ASSESSMENT:   88 y.o. [place], [place] woman status post right breast upper outer quadrant biopsy 08/28/2016 for a clinical T2 N0, stage IB invasive lobular carcinoma, grade not stated, estrogen and progesterone  strongly positive, HER-2 nonamplified, with an MIB-1 of 5%.  (1) anastrozole started 09/25/2016  (a) bone density on 10/04/2016 at [facility] showed a T score of -1.4 osteopenic  (b) repeat bone density 01/16/2018 showed a T score of -1.6.  (2) Status post right mastectomy without sentinel lymph node sampling 11/27/2016 for a pT2 cN0, stage IB invasive lobular carcinoma, grade 1, with negative margins.  (3) no adjuvant radiation indicated   PLAN:  Patient is here for follow-up on adjuvant anastrozole.  She has been tolerating this very well.  No adverse effects reported.  Last mammogram in April 2023 no mammographic evidence of malignancy On physical examination no concern for breast cancer recurrence.  She was encouraged to schedule her left mammogram every year as long as she is functional. She is now done with 5 yrs of antiestrogen therapy.  She will graduate from breast cancer clinic and will continue to follow-up with her PCP regularly. Last bone density in 2019 osteopenia, I ordered bone density but this has not been scheduled yet.  We will follow-up on this She should continue vit D, calcium as tolerated and weight bearing exercises. She can have future bone density monitoring by her PCP  Total time: 30 minutes. *Total Encounter Time as defined by the Centers for Medicare and Medicaid Services includes, in addition to the face-to-face time of a patient visit (documented in the  note above) non-face-to-face time: obtaining and reviewing outside history, ordering and reviewing medications, tests or procedures, care coordination (communications with other health care professionals or caregivers) and documentation in the medical record.

## 2021-10-10 DIAGNOSIS — E785 Hyperlipidemia, unspecified: Secondary | ICD-10-CM | POA: Diagnosis not present

## 2021-10-10 DIAGNOSIS — I10 Essential (primary) hypertension: Secondary | ICD-10-CM | POA: Diagnosis not present

## 2021-10-10 DIAGNOSIS — I2510 Atherosclerotic heart disease of native coronary artery without angina pectoris: Secondary | ICD-10-CM | POA: Diagnosis not present

## 2021-10-10 DIAGNOSIS — E039 Hypothyroidism, unspecified: Secondary | ICD-10-CM | POA: Diagnosis not present

## 2021-11-21 DIAGNOSIS — Z780 Asymptomatic menopausal state: Secondary | ICD-10-CM | POA: Diagnosis not present

## 2021-12-07 DIAGNOSIS — R2689 Other abnormalities of gait and mobility: Secondary | ICD-10-CM | POA: Diagnosis not present

## 2021-12-07 DIAGNOSIS — I10 Essential (primary) hypertension: Secondary | ICD-10-CM | POA: Diagnosis not present

## 2021-12-07 DIAGNOSIS — I2510 Atherosclerotic heart disease of native coronary artery without angina pectoris: Secondary | ICD-10-CM | POA: Diagnosis not present

## 2021-12-07 DIAGNOSIS — M25571 Pain in right ankle and joints of right foot: Secondary | ICD-10-CM | POA: Diagnosis not present

## 2021-12-14 ENCOUNTER — Encounter: Payer: Self-pay | Admitting: Hematology and Oncology

## 2022-01-30 DIAGNOSIS — Z1152 Encounter for screening for COVID-19: Secondary | ICD-10-CM | POA: Diagnosis not present

## 2022-01-30 DIAGNOSIS — R051 Acute cough: Secondary | ICD-10-CM | POA: Diagnosis not present

## 2022-02-07 DIAGNOSIS — D1801 Hemangioma of skin and subcutaneous tissue: Secondary | ICD-10-CM | POA: Diagnosis not present

## 2022-02-07 DIAGNOSIS — D485 Neoplasm of uncertain behavior of skin: Secondary | ICD-10-CM | POA: Diagnosis not present

## 2022-02-07 DIAGNOSIS — D0359 Melanoma in situ of other part of trunk: Secondary | ICD-10-CM | POA: Diagnosis not present

## 2022-02-07 DIAGNOSIS — R051 Acute cough: Secondary | ICD-10-CM | POA: Diagnosis not present

## 2022-02-07 DIAGNOSIS — D2272 Melanocytic nevi of left lower limb, including hip: Secondary | ICD-10-CM | POA: Diagnosis not present

## 2022-02-07 DIAGNOSIS — Z1152 Encounter for screening for COVID-19: Secondary | ICD-10-CM | POA: Diagnosis not present

## 2022-02-07 DIAGNOSIS — D224 Melanocytic nevi of scalp and neck: Secondary | ICD-10-CM | POA: Diagnosis not present

## 2022-02-07 DIAGNOSIS — C44311 Basal cell carcinoma of skin of nose: Secondary | ICD-10-CM | POA: Diagnosis not present

## 2022-02-07 DIAGNOSIS — Z85820 Personal history of malignant melanoma of skin: Secondary | ICD-10-CM | POA: Diagnosis not present

## 2022-02-07 DIAGNOSIS — L821 Other seborrheic keratosis: Secondary | ICD-10-CM | POA: Diagnosis not present

## 2022-02-07 DIAGNOSIS — R5383 Other fatigue: Secondary | ICD-10-CM | POA: Diagnosis not present

## 2022-02-21 ENCOUNTER — Ambulatory Visit: Payer: Medicare Other | Attending: Cardiovascular Disease | Admitting: Cardiovascular Disease

## 2022-02-21 ENCOUNTER — Encounter: Payer: Self-pay | Admitting: Cardiovascular Disease

## 2022-02-21 VITALS — BP 122/74 | HR 78 | Ht 68.0 in | Wt 173.8 lb

## 2022-02-21 DIAGNOSIS — I5032 Chronic diastolic (congestive) heart failure: Secondary | ICD-10-CM

## 2022-02-21 DIAGNOSIS — I2510 Atherosclerotic heart disease of native coronary artery without angina pectoris: Secondary | ICD-10-CM

## 2022-02-21 DIAGNOSIS — I10 Essential (primary) hypertension: Secondary | ICD-10-CM

## 2022-02-21 DIAGNOSIS — E7800 Pure hypercholesterolemia, unspecified: Secondary | ICD-10-CM

## 2022-02-21 DIAGNOSIS — I25118 Atherosclerotic heart disease of native coronary artery with other forms of angina pectoris: Secondary | ICD-10-CM

## 2022-02-21 NOTE — Patient Instructions (Signed)
Medication Instructions:  NO CHANGES  *If you need a refill on your cardiac medications before your next appointment, please call your pharmacy*    Follow-Up: At [facility], you and your health needs are our priority.  As part of our continuing mission to provide you with exceptional heart care, we have created designated Provider Care Teams.  These Care Teams include your primary Cardiologist (physician) and Advanced Practice Providers (APPs -  Physician Assistants and Nurse Practitioners) who all work together to provide you with the care you need, when you need it.  We recommend signing up for the patient portal called "MyChart".  Sign up information is provided on this After Visit Summary.  MyChart is used to connect with patients for Virtual Visits (Telemedicine).  Patients are able to view lab/test results, encounter notes, upcoming appointments, etc.  Non-urgent messages can be sent to your provider as well.   To learn more about what you can do with MyChart, go to [email or web address].    Your next appointment:   12 month(s)  The format for your next appointment:   In Person  Provider:   Sanda Klein, MD

## 2022-02-21 NOTE — Progress Notes (Signed)
Cardiology Office Note    Date:  02/21/2022   ID:  Kathy Murphy, DOB 31-Mar-1927, MRN [id number]  PCP:  Burnard Bunting, MD  Cardiologist:   Sanda Klein, MD   Chief Complaint  Patient presents with   Coronary Artery Disease          History of Present Illness:  Kathy Murphy is a 88 y.o. female returning in follow-up for CAD, roughly 6 years after placement of 2 overlapping drug-eluting stents in the subtotally occluded right coronary artery (2.5 x 38 mm Synergy).   At the time of the cardiac catheterization in 2016 (despite negative nuclear scan) showed subtotal occlusion of the RCA.  She also had high-grade stenoses in the diagonal artery, ramus intermedius branch, distal LAD which were left for medical therapy.  The presence of widespread coronary lesions may explain the "false negative" perfusion pattern, but with depressed left ventricular systolic function (49%). Upper limit of normal LVEDP at cath.  Repeat echocardiogram in 2022 showed normal LV systolic function and wall motion, in March 2023 echo showed EF 50-55%.  Small ascending aortic aneurysm measured at 4.2 cm on echo.  Attempted repeat cardiac catheterization in October 2022 was unsuccessful due to her inability to cooperate.  On current treatment with 2 antianginals (metoprolol succinate 100 mg daily, isosorbide mononitrate 30 mg daily), she does not have any angina.  She lives independently at home.  She has help for housekeeping once every 3 weeks.  She does get a little short of breath walking from one end of the house to the other.  Does not have orthopnea, PND, lower extremity edema, palpitations, dizziness, focal neurological events, syncope, claudication.  She continues to have hot flashes while on treatment with anastrozole.   Denies recent problems with chest pressure, exertional dyspnea, orthopnea, PND or lower extremity edema.  She walks with a walker but remains independent and still drives her car.   Denies palpitations, syncope, focal neurological events, claudication.  As before frequent urination is her biggest complaint.  She has hot flashes attributable to chronic treatment with anastrozole.  In September 2018 she underwent lumpectomy for cancer of the right breast.  She did not receive radiation or chemotherapy.    She had right knee replacement in 2019.  She had a skin cancer removed from her left shoulder in December 2019.   Past Medical History:  Diagnosis Date   Arthritis    Breast cancer ([place])    Right   CAD (coronary artery disease)    Cancer ([place])    thyroid   Chronic combined systolic and diastolic CHF (congestive heart failure) (HCC)    Dilated aortic root (HCC)    First degree AV block    GERD (gastroesophageal reflux disease)    GI bleed 1980   AFTER POLYP EXCISION   Heart disease, hypertensive    Hypercholesterolemia    Hypertension    Hypothyroidism    Incontinence of urine    Myalgia    Myocardial infarction ([place])    Obesity    Pain in joints    Retinal detachment    Unsteady gait    UTI (urinary tract infection)    Vision loss of left eye    Vomiting     Past Surgical History:  Procedure Laterality Date   ABDOMINAL HYSTERECTOMY  1977   BREAST BIOPSY     BREAST LUMPECTOMY     x5   CARDIAC CATHETERIZATION N/A 02/08/2016   Procedure: Left Heart Cath  and Coronary Angiography;  Surgeon: Belva Crome, MD;  Location: [place] [facility];  Service: Cardiovascular;  Laterality: N/A;   CARDIAC CATHETERIZATION N/A 02/11/2016   Procedure: Coronary Stent Intervention;  Surgeon: Belva Crome, MD;  Location: [place] [facility];  Service: Cardiovascular;  Laterality: N/A;   CATARACT EXTRACTION, BILATERAL     CERVICAL LAMINECTOMY     DENTAL SURGERY  1952   EVACUATION BREAST HEMATOMA Right 01/09/2017   Procedure: EVACUATION RIGHT MASTECTOMY FLUID COLLECTION;  Surgeon: Rolm Bookbinder, MD;  Location: [place];  Service: General;  Laterality: Right;   [place]  2012   left   TOTAL KNEE ARTHROPLASTY Right 05/18/2017   Procedure: RIGHT TOTAL KNEE ARTHROPLASTY;  Surgeon: Netta Cedars, MD;  Location: [place];  Service: Orthopedics;  Laterality: Right;   TOTAL MASTECTOMY Right 11/27/2016   TOTAL MASTECTOMY Right 11/27/2016   Procedure: RIGHT TOTAL MASTECTOMY;  Surgeon: Rolm Bookbinder, MD;  Location: [place];  Service: General;  Laterality: Right;    Current Medications: Outpatient Medications Prior to Visit  Medication Sig Dispense Refill   acetaminophen (TYLENOL) 650 MG CR tablet Take 650 mg by mouth every 8 (eight) hours as needed for pain.     aspirin EC 81 MG tablet Take 81 mg by mouth daily.     Carboxymethylcellulose Sodium (REFRESH TEARS OP) Apply 1-2 drops to eye daily.     Cholecalciferol (VITAMIN D3) 125 MCG (5000 UT) CAPS Take 1 capsule by mouth daily.     diazepam (VALIUM) 2 MG tablet Take 1 tablet (2 mg total) by mouth every 6 (six) hours as needed for anxiety. 2 tablet 0   Glycerin-Polysorbate 80 (REFRESH DRY EYE THERAPY OP)      isosorbide mononitrate (IMDUR) 30 MG 24 hr tablet Take 1 tablet (30 mg total) by mouth daily. 90 tablet 3   levothyroxine (SYNTHROID, LEVOTHROID) 125 MCG tablet Take 125 mcg by mouth daily before breakfast.     metoprolol succinate (TOPROL-XL) 100 MG 24 hr tablet TAKE 1 TABLET DAILY (Patient taking differently: Take 100 mg by mouth daily.) 90 tablet 3   nitroGLYCERIN (NITROSTAT) 0.4 MG SL tablet Place 1 tablet (0.4 mg total) under the tongue every 5 (five) minutes x 3 doses as needed for chest pain. 30 tablet 12   pantoprazole (PROTONIX) 40 MG tablet Take 40 mg by mouth daily.     rosuvastatin (CRESTOR) 10 MG tablet Take 10 mg by mouth every morning.     Diclofenac Sodium CR 100 MG 24 hr tablet Take 100 mg by oral route.     anastrozole (ARIMIDEX) 1 MG tablet Take 1 tablet (1 mg total) by mouth daily. (Patient not taking: Reported on  02/21/2022) 30 tablet 0   No facility-administered medications prior to visit.     Allergies:   Azithromycin, Other, Adhesive [tape], and Latex   Social History   Socioeconomic History   Marital status: Widowed    Spouse name: Not on file   Number of children: Not on file   Years of education: Not on file   Highest education level: Not on file  Occupational History   Not on file  Tobacco Use   Smoking status: Never   Smokeless tobacco: Never  Vaping Use   Vaping Use: Never used  Substance and Sexual Activity   Alcohol use: No   Drug use: No  Sexual activity: Not on file  Other Topics Concern   Not on file  Social History Narrative   Widow   3 cars    Active with church and friends   Lived in home since 05-26-1959    Worked for & retired from [employer]    Husband died in 05/25/2013   Social Determinants of Health   Financial Resource Strain: [place]  (06/28/2021)   Overall Financial Resource Strain (CARDIA)    Difficulty of Paying Living Expenses: Not hard at all  Food Insecurity: No Food Insecurity (07/28/2021)   Hunger Vital Sign    Worried About Running Out of Food in the Last Year: Never true    Ran Out of Food in the Last Year: Never true  Transportation Needs: No Transportation Needs (07/28/2021)   PRAPARE - Hydrologist (Medical): No    Lack of Transportation (Non-Medical): No  Physical Activity: Inactive (07/28/2021)   Exercise Vital Sign    Days of Exercise per Week: 0 days    Minutes of Exercise per Session: 0 min  Stress: No Stress Concern Present (06/28/2021)   [place]    Feeling of Stress : Only a little  Social Connections: Moderately Integrated (07/28/2021)   Social Connection and Isolation Panel [NHANES]    Frequency of Communication with Friends and Family: Three times a week    Frequency of Social Gatherings with Friends and  Family: Three times a week    Attends Religious Services: 1 to 4 times per year    Active Member of Clubs or Organizations: Yes    Attends Archivist Meetings: 1 to 4 times per year    Marital Status: Widowed     Family History:  The patient's family history includes Heart disease in her mother.   ROS:   Please see the history of present illness.    ROS All other systems are reviewed and are negative.   PHYSICAL EXAM:   VS:  BP 122/74   Pulse 78   Ht '5\' 8"'$  (1.727 m)   Wt 78.8 kg   BMI 26.43 kg/m      General: Alert, oriented x3, no distress, appears younger than stated age Head: no evidence of trauma, PERRL, EOMI, no exophtalmos or lid lag, no myxedema, no xanthelasma; normal ears, nose and oropharynx Neck: normal jugular venous pulsations and no hepatojugular reflux; brisk carotid pulses without delay and no carotid bruits Chest: clear to auscultation, no signs of consolidation by percussion or palpation, normal fremitus, symmetrical and full respiratory excursions Cardiovascular: normal position and quality of the apical impulse, regular rhythm, normal first and second heart sounds, no murmurs, rubs or gallops Abdomen: no tenderness or distention, no masses by palpation, no abnormal pulsatility or arterial bruits, normal bowel sounds, no hepatosplenomegaly Extremities: no clubbing, cyanosis or edema; 2+ radial, ulnar and brachial pulses bilaterally; 2+ right femoral, posterior tibial and dorsalis pedis pulses; 2+ left femoral, posterior tibial and dorsalis pedis pulses; no subclavian or femoral bruits Neurological: grossly nonfocal Psych: Normal mood and affect    Wt Readings from Last 3 Encounters:  02/21/22 78.8 kg  09/22/21 78.6 kg  05/24/21 79.4 kg    Studies/Labs Reviewed:   Echo 05/12/2021:   1. Left ventricular ejection fraction, by estimation, is 50 to 55%. Left  ventricular ejection fraction by 2D MOD biplane is 50.1 %. The left  ventricle has  low  normal function. The left ventricle demonstrates  regional wall motion abnormalities (see  scoring diagram/findings for description). There is mild left ventricular  hypertrophy. Left ventricular diastolic parameters are consistent with  Grade I diastolic dysfunction (impaired relaxation). There is moderate  hypokinesis of the left ventricular,  basal inferoseptal wall and inferior wall. The average left ventricular  global longitudinal strain is -18.7 %. The global longitudinal strain is  mildly abnormal with predominant basal and inferoseptal strain  abnormalities.   2. Right ventricular systolic function is normal. The right ventricular  size is normal. There is normal pulmonary artery systolic pressure. The  estimated right ventricular systolic pressure is 52.8 mmHg.   3. Left atrial size was mildly dilated.   4. The mitral valve is grossly normal. Trivial mitral valve  regurgitation.   5. The non-coronary cusp appears fixed. The aortic valve is abnormal.  There is mild calcification of the aortic valve. Aortic valve  regurgitation is not visualized.   6. Aortic dilatation noted. There is mild dilatation of the aortic root,  measuring 42 mm. There is borderline dilatation of the ascending aorta,  measuring 38 mm.   7. The inferior vena cava is normal in size with greater than 50%  respiratory variability, suggesting right atrial pressure of 3 mmHg.   8. Cannot exclude a small PFO.   Comparison(s): Changes from prior study are noted. 12/12/2020: LVEF 55-60%,  RVSP 48 mmHg.   EKG:  EKG is ordered today.  It shows sinus rhythm with mildly prolonged PR interval, chronic pattern of LVH with septal repolarization normalities, old QS leads V1-V2, QTc 460 ms; no changes  Labs: Lipid Panel     Component Value Date/Time   CHOL 82 12/13/2020 0536   CHOL 116 04/16/2017 1037   TRIG 31 12/13/2020 0536   HDL 40 (L) 12/13/2020 0536   HDL 45 04/16/2017 1037   CHOLHDL 2.1 12/13/2020 0536    VLDL 6 12/13/2020 0536   LDLCALC 36 12/13/2020 0536   LDLCALC 57 04/16/2017 1037    11/04/2019 Cholesterol 119, HDL 43, LDL 61, triglycerides 75 Potassium 3.9, normal liver function tests, TSH 0.38  05/19/2021 Cholesterol 111, HDL 40, LDL 57, triglycerides 68 Creatinine 0.99, potassium 3.9, hemoglobin A1c 5.1%, hemoglobin 12.5, TSH 0.14  BMET    Component Value Date/Time   NA 135 05/13/2021 0058   NA 140 01/22/2017 0946   K 3.4 (L) 05/13/2021 0058   K 4.1 01/22/2017 0946   CL 102 05/13/2021 0058   CO2 25 05/13/2021 0058   CO2 29 01/22/2017 0946   GLUCOSE 102 (H) 05/13/2021 0058   GLUCOSE 101 01/22/2017 0946   BUN 24 (H) 05/13/2021 0058   BUN 17.9 01/22/2017 0946   CREATININE 0.99 05/13/2021 0058   CREATININE 0.8 01/22/2017 0946   CALCIUM 9.0 05/13/2021 0058   CALCIUM 9.1 01/22/2017 0946   GFRNONAA 54 (L) 05/13/2021 0058   GFRAA 57 (L) 01/01/2019 1241     ASSESSMENT:    1. Chronic diastolic heart failure ([place])   2. Coronary artery disease of native artery of native heart with stable angina pectoris ([place])   3. Hypercholesterolemia   4. Essential hypertension       PLAN:  In order of problems listed above:  CHF: NYHA functional class 2, gets short of breath when walking longer distances, but not with activities of personal care.  Clinically euvolemic, without taking any diuretics.  Most recent assessment of LVEF is 50-55% in March 2023.  Had a transient reduction in LV systolic function presumably due to coronary insufficiency. CAD: Aspirin, beta-blocker, statin.  Asymptomatic on 2 antianginals.  Noninvasive studies unreliable for assessment with "false negative" nuclear stress test (possibly due to some degree of balanced defects?). HLP: All lipid parameters are currently in acceptable range on statin therapy. HTN: Well-controlled.  Medication Adjustments/Labs and Tests Ordered: Current medicines are reviewed at length with the patient today.  Concerns regarding  medicines are outlined above.  Medication changes, Labs and Tests ordered today are listed in the Patient Instructions below. Patient Instructions  Medication Instructions:  NO CHANGES  *If you need a refill on your cardiac medications before your next appointment, please call your pharmacy*    Follow-Up: At [facility], you and your health needs are our priority.  As part of our continuing mission to provide you with exceptional heart care, we have created designated Provider Care Teams.  These Care Teams include your primary Cardiologist (physician) and Advanced Practice Providers (APPs -  Physician Assistants and Nurse Practitioners) who all work together to provide you with the care you need, when you need it.  We recommend signing up for the patient portal called "MyChart".  Sign up information is provided on this After Visit Summary.  MyChart is used to connect with patients for Virtual Visits (Telemedicine).  Patients are able to view lab/test results, encounter notes, upcoming appointments, etc.  Non-urgent messages can be sent to your provider as well.   To learn more about what you can do with MyChart, go to [email or web address].    Your next appointment:   12 month(s)  The format for your next appointment:   In Person  Provider:   Sanda Klein, MD      Signed, Sanda Klein, MD  02/21/2022 5:31 PM    [facility], [place], [place]  [address] Phone: [phone]; Fax: [phone]

## 2022-03-15 DIAGNOSIS — D0359 Melanoma in situ of other part of trunk: Secondary | ICD-10-CM | POA: Diagnosis not present

## 2022-03-15 DIAGNOSIS — L988 Other specified disorders of the skin and subcutaneous tissue: Secondary | ICD-10-CM | POA: Diagnosis not present

## 2022-04-04 ENCOUNTER — Other Ambulatory Visit: Payer: Self-pay | Admitting: Cardiovascular Disease

## 2022-05-24 ENCOUNTER — Other Ambulatory Visit: Payer: Self-pay | Admitting: Cardiovascular Disease

## 2022-06-05 DIAGNOSIS — R2689 Other abnormalities of gait and mobility: Secondary | ICD-10-CM | POA: Diagnosis not present

## 2022-06-05 DIAGNOSIS — M169 Osteoarthritis of hip, unspecified: Secondary | ICD-10-CM | POA: Diagnosis not present

## 2022-06-05 DIAGNOSIS — R296 Repeated falls: Secondary | ICD-10-CM | POA: Diagnosis not present

## 2022-06-05 DIAGNOSIS — I10 Essential (primary) hypertension: Secondary | ICD-10-CM | POA: Diagnosis not present

## 2022-06-13 DIAGNOSIS — H524 Presbyopia: Secondary | ICD-10-CM | POA: Diagnosis not present

## 2022-06-13 DIAGNOSIS — Z961 Presence of intraocular lens: Secondary | ICD-10-CM | POA: Diagnosis not present

## 2022-06-15 DIAGNOSIS — R2681 Unsteadiness on feet: Secondary | ICD-10-CM | POA: Diagnosis not present

## 2022-06-19 ENCOUNTER — Ambulatory Visit (INDEPENDENT_AMBULATORY_CARE_PROVIDER_SITE_OTHER): Payer: Medicare Other | Admitting: Podiatry

## 2022-06-19 ENCOUNTER — Ambulatory Visit (INDEPENDENT_AMBULATORY_CARE_PROVIDER_SITE_OTHER): Payer: Medicare Other

## 2022-06-19 ENCOUNTER — Other Ambulatory Visit: Payer: Self-pay | Admitting: Podiatry

## 2022-06-19 DIAGNOSIS — M2142 Flat foot [pes planus] (acquired), left foot: Secondary | ICD-10-CM | POA: Diagnosis not present

## 2022-06-19 DIAGNOSIS — G8929 Other chronic pain: Secondary | ICD-10-CM

## 2022-06-19 DIAGNOSIS — M79671 Pain in right foot: Secondary | ICD-10-CM

## 2022-06-19 DIAGNOSIS — L84 Corns and callosities: Secondary | ICD-10-CM

## 2022-06-19 DIAGNOSIS — M2141 Flat foot [pes planus] (acquired), right foot: Secondary | ICD-10-CM | POA: Diagnosis not present

## 2022-06-21 ENCOUNTER — Other Ambulatory Visit: Payer: Self-pay | Admitting: Podiatry

## 2022-06-21 DIAGNOSIS — L84 Corns and callosities: Secondary | ICD-10-CM

## 2022-06-21 DIAGNOSIS — G8929 Other chronic pain: Secondary | ICD-10-CM

## 2022-06-21 DIAGNOSIS — M2141 Flat foot [pes planus] (acquired), right foot: Secondary | ICD-10-CM

## 2022-06-21 DIAGNOSIS — M79671 Pain in right foot: Secondary | ICD-10-CM

## 2022-06-21 NOTE — Progress Notes (Signed)
Subjective:   Patient ID: Kathy Murphy, female   DOB: 89 y.o.   MRN: [id number]   HPI Chief Complaint  Patient presents with   Callouses    Right foot 3 places on foot causing discomfort    89 year old female presents the office for above concerns.  She has had chronic pain on the right foot and she has 3 spots in particular that are causing pain.  She has previously had surgery on what appears to be the talonavicular joint previously.  She states that it hurts to walk anytime she puts pressure on the spot and she points to 1 area along the proximal medial foot where she gets the majority discomfort.  She does not report any drainage or pus coming from the area.  No injuries.   Review of Systems  All other systems reviewed and are negative.  Past Medical History:  Diagnosis Date   Arthritis    Breast cancer (HCC)    Right   CAD (coronary artery disease)    Cancer (HCC)    thyroid   Chronic combined systolic and diastolic CHF (congestive heart failure) (HCC)    Dilated aortic root (HCC)    First degree AV block    GERD (gastroesophageal reflux disease)    GI bleed 1980   AFTER POLYP EXCISION   Heart disease, hypertensive    Hypercholesterolemia    Hypertension    Hypothyroidism    Incontinence of urine    Myalgia    Myocardial infarction (HCC)    Obesity    Pain in joints    Retinal detachment    Unsteady gait    UTI (urinary tract infection)    Vision loss of left eye    Vomiting     Past Surgical History:  Procedure Laterality Date   ABDOMINAL HYSTERECTOMY  1977   BREAST BIOPSY     BREAST LUMPECTOMY     x5   CARDIAC CATHETERIZATION N/A 02/08/2016   Procedure: Left Heart Cath and Coronary Angiography;  Surgeon: Lyn Records, MD;  Location: [facility];  Service: Cardiovascular;  Laterality: N/A;   CARDIAC CATHETERIZATION N/A 02/11/2016   Procedure: Coronary Stent Intervention;  Surgeon: Lyn Records, MD;  Location: [facility];  Service:  Cardiovascular;  Laterality: N/A;   CATARACT EXTRACTION, BILATERAL     CERVICAL LAMINECTOMY     DENTAL SURGERY  1952   EVACUATION BREAST HEMATOMA Right 01/09/2017   Procedure: EVACUATION RIGHT MASTECTOMY FLUID COLLECTION;  Surgeon: Emelia Loron, MD;  Location: [facility];  Service: General;  Laterality: Right;   FOOT SURGERY     NECK SURGERY     THYROIDECTOMY  1957   TOTAL HIP ARTHROPLASTY  2012   left   TOTAL KNEE ARTHROPLASTY Right 05/18/2017   Procedure: RIGHT TOTAL KNEE ARTHROPLASTY;  Surgeon: Beverely Low, MD;  Location: [facility] OR;  Service: Orthopedics;  Laterality: Right;   TOTAL MASTECTOMY Right 11/27/2016   TOTAL MASTECTOMY Right 11/27/2016   Procedure: RIGHT TOTAL MASTECTOMY;  Surgeon: Emelia Loron, MD;  Location: [facility] OR;  Service: General;  Laterality: Right;     Current Outpatient Medications:    acetaminophen (TYLENOL) 650 MG CR tablet, Take 650 mg by mouth every 8 (eight) hours as needed for pain., Disp: , Rfl:    aspirin EC 81 MG tablet, Take 81 mg by mouth daily., Disp: , Rfl:    Carboxymethylcellulose Sodium (REFRESH TEARS OP), Apply 1-2 drops to eye daily., Disp: , Rfl:  Cholecalciferol (VITAMIN D3) 125 MCG (5000 UT) CAPS, Take 1 capsule by mouth daily., Disp: , Rfl:    diazepam (VALIUM) 2 MG tablet, Take 1 tablet (2 mg total) by mouth every 6 (six) hours as needed for anxiety., Disp: 2 tablet, Rfl: 0   Diclofenac Sodium CR 100 MG 24 hr tablet, Take 100 mg by oral route., Disp: , Rfl:    Glycerin-Polysorbate 80 (REFRESH DRY EYE THERAPY OP), , Disp: , Rfl:    isosorbide mononitrate (IMDUR) 30 MG 24 hr tablet, TAKE 1 TABLET DAILY, Disp: 90 tablet, Rfl: 3   levothyroxine (SYNTHROID, LEVOTHROID) 125 MCG tablet, Take 125 mcg by mouth daily before breakfast., Disp: , Rfl:    metoprolol succinate (TOPROL-XL) 100 MG 24 hr tablet, TAKE 1 TABLET DAILY, Disp: 90 tablet, Rfl: 3   nitroGLYCERIN (NITROSTAT) 0.4 MG SL tablet, Place 1 tablet (0.4 mg total) under the tongue every 5  (five) minutes x 3 doses as needed for chest pain., Disp: 30 tablet, Rfl: 12   pantoprazole (PROTONIX) 40 MG tablet, Take 40 mg by mouth daily., Disp: , Rfl:    rosuvastatin (CRESTOR) 10 MG tablet, Take 10 mg by mouth every morning., Disp: , Rfl:   Allergies  Allergen Reactions   Azithromycin     Other reaction(s): Unknown Other reaction(s): Unknown   Other Other (See Comments)    SOME OF THE "MYCINS"...UNKNOWN REACTION.   Adhesive [Tape] Rash   Latex Rash          Objective:  Physical Exam  General: AAO x3, NAD  Dermatological: There are 3 areas on the right foot that have hyperkeratotic lesions.  Most notable one is on the proximal medial aspect along the talonavicular joint and there is one along the medial cuneiform area as well as the first metatarsophalangeal joint.  There is some dried blood present underneath the most proximal 1.  However upon debridement there is no underlying ulceration drainage or any signs of infection.  There are no open lesions.  Vascular: Dorsalis Pedis artery and Posterior Tibial artery pedal pulses are 2/4 bilateral with immedate capillary fill time. There is no pain with calf compression, swelling, warmth, erythema.   Neruologic: Grossly intact via light touch bilateral.  Musculoskeletal: Significant flatfoot present.  Tenderness palpation along the hyperkeratotic areas and there is prominence noted with atrophy of the fat pad of these areas.  MMT 5/5.  Gait: Unassisted, Nonantalgic.       Assessment:   89 year old female with significant flatfoot, preulcerative calluses     Plan:  -Treatment options discussed including all alternatives, risks, and complications -Etiology of symptoms were discussed -X-rays were obtained and reviewed with the patient.  3 views of the foot were obtained.  2 screws noted along the talonavicular joint.  Joint space still present.  Flatfoot is present.  No evidence of acute fracture. -As a courtesy debrided  the calluses without any complications or bleeding.  Discussed moisturizer and offloading.  We discussed surgical versus nonsurgical options.  She wants to continue with conservative care.  I think her best option at this time is an accommodative offloading orthotic to help support the foot so would also be mostly to offload the prominent areas.  She was to proceed with this.  She was measured for inserts today.  Vivi Barrack DPM

## 2022-06-26 DIAGNOSIS — R2681 Unsteadiness on feet: Secondary | ICD-10-CM | POA: Diagnosis not present

## 2022-06-29 DIAGNOSIS — R2681 Unsteadiness on feet: Secondary | ICD-10-CM | POA: Diagnosis not present

## 2022-07-03 DIAGNOSIS — Z1231 Encounter for screening mammogram for malignant neoplasm of breast: Secondary | ICD-10-CM | POA: Diagnosis not present

## 2022-07-04 DIAGNOSIS — R2681 Unsteadiness on feet: Secondary | ICD-10-CM | POA: Diagnosis not present

## 2022-07-06 DIAGNOSIS — R2681 Unsteadiness on feet: Secondary | ICD-10-CM | POA: Diagnosis not present

## 2022-07-11 ENCOUNTER — Other Ambulatory Visit: Payer: Self-pay | Admitting: Registered Nurse

## 2022-07-11 DIAGNOSIS — I10 Essential (primary) hypertension: Secondary | ICD-10-CM | POA: Diagnosis not present

## 2022-07-11 DIAGNOSIS — R296 Repeated falls: Secondary | ICD-10-CM | POA: Diagnosis not present

## 2022-07-11 DIAGNOSIS — R55 Syncope and collapse: Secondary | ICD-10-CM | POA: Diagnosis not present

## 2022-07-11 DIAGNOSIS — I2510 Atherosclerotic heart disease of native coronary artery without angina pectoris: Secondary | ICD-10-CM | POA: Diagnosis not present

## 2022-07-11 DIAGNOSIS — R2689 Other abnormalities of gait and mobility: Secondary | ICD-10-CM | POA: Diagnosis not present

## 2022-07-20 ENCOUNTER — Telehealth: Payer: Self-pay | Admitting: Podiatry

## 2022-07-20 NOTE — Telephone Encounter (Signed)
Lmom for pt to call back to schedule appt to get orthotics    Balance 490

## 2022-07-26 DIAGNOSIS — M19041 Primary osteoarthritis, right hand: Secondary | ICD-10-CM | POA: Diagnosis not present

## 2022-07-26 DIAGNOSIS — M19042 Primary osteoarthritis, left hand: Secondary | ICD-10-CM | POA: Diagnosis not present

## 2022-07-26 DIAGNOSIS — M25511 Pain in right shoulder: Secondary | ICD-10-CM | POA: Diagnosis not present

## 2022-07-27 ENCOUNTER — Ambulatory Visit
Admission: RE | Admit: 2022-07-27 | Discharge: 2022-07-27 | Disposition: A | Discharge status: Home / Self Care | Payer: Medicare Other | Source: Ambulatory Visit | Attending: Registered Nurse | Admitting: Registered Nurse

## 2022-07-27 DIAGNOSIS — I6782 Cerebral ischemia: Secondary | ICD-10-CM | POA: Diagnosis not present

## 2022-07-27 DIAGNOSIS — R55 Syncope and collapse: Secondary | ICD-10-CM

## 2022-07-27 MED ORDER — GADOPICLENOL 0.5 MMOL/ML IV SOLN
8.0000 mL | Freq: Once | INTRAVENOUS | Status: AC | PRN
  Administered 2022-07-27: 8 mL via INTRAVENOUS

## 2022-08-08 DIAGNOSIS — D225 Melanocytic nevi of trunk: Secondary | ICD-10-CM | POA: Diagnosis not present

## 2022-08-08 DIAGNOSIS — L218 Other seborrheic dermatitis: Secondary | ICD-10-CM | POA: Diagnosis not present

## 2022-08-08 DIAGNOSIS — L821 Other seborrheic keratosis: Secondary | ICD-10-CM | POA: Diagnosis not present

## 2022-08-08 DIAGNOSIS — Z85828 Personal history of other malignant neoplasm of skin: Secondary | ICD-10-CM | POA: Diagnosis not present

## 2022-08-08 DIAGNOSIS — Z85820 Personal history of malignant melanoma of skin: Secondary | ICD-10-CM | POA: Diagnosis not present

## 2022-08-08 DIAGNOSIS — D2239 Melanocytic nevi of other parts of face: Secondary | ICD-10-CM | POA: Diagnosis not present

## 2022-08-08 DIAGNOSIS — L905 Scar conditions and fibrosis of skin: Secondary | ICD-10-CM | POA: Diagnosis not present

## 2022-08-10 ENCOUNTER — Encounter: Payer: Self-pay | Admitting: Nurse Practitioner

## 2022-08-10 ENCOUNTER — Encounter: Payer: Self-pay | Admitting: *Deleted

## 2022-08-10 ENCOUNTER — Ambulatory Visit: Payer: Medicare Other | Attending: Nurse Practitioner | Admitting: Nurse Practitioner

## 2022-08-10 VITALS — BP 113/67 | HR 61 | Ht 66.0 in | Wt 172.6 lb

## 2022-08-10 DIAGNOSIS — I2510 Atherosclerotic heart disease of native coronary artery without angina pectoris: Secondary | ICD-10-CM | POA: Insufficient documentation

## 2022-08-10 DIAGNOSIS — I10 Essential (primary) hypertension: Secondary | ICD-10-CM | POA: Insufficient documentation

## 2022-08-10 DIAGNOSIS — R55 Syncope and collapse: Secondary | ICD-10-CM | POA: Diagnosis not present

## 2022-08-10 DIAGNOSIS — I77810 Thoracic aortic ectasia: Secondary | ICD-10-CM | POA: Insufficient documentation

## 2022-08-10 DIAGNOSIS — R42 Dizziness and giddiness: Secondary | ICD-10-CM | POA: Diagnosis not present

## 2022-08-10 DIAGNOSIS — I5022 Chronic systolic (congestive) heart failure: Secondary | ICD-10-CM | POA: Diagnosis not present

## 2022-08-10 DIAGNOSIS — E785 Hyperlipidemia, unspecified: Secondary | ICD-10-CM | POA: Insufficient documentation

## 2022-08-10 NOTE — Progress Notes (Signed)
Office Visit    Patient Name: Kathy Murphy Date of Encounter: 08/10/2022  Primary Care Provider:  Geoffry Paradise, MD Primary Cardiologist:  Thurmon Fair, MD  Chief Complaint    89 year old female with a history of CAD s/p PCI, DES x 2 overlapping-RCA in 2017, chronic heart failure with mildly reduced EF, mild dilation of aortic root, hypertension, hyperlipidemia, GERD, and breast cancer who presents for follow-up related to syncope.  Past Medical History    Past Medical History:  Diagnosis Date   Arthritis    Breast cancer (HCC)    Right   CAD (coronary artery disease)    Cancer (HCC)    thyroid   Chronic combined systolic and diastolic CHF (congestive heart failure) (HCC)    Dilated aortic root (HCC)    First degree AV block    GERD (gastroesophageal reflux disease)    GI bleed 1980   AFTER POLYP EXCISION   Heart disease, hypertensive    Hypercholesterolemia    Hypertension    Hypothyroidism    Incontinence of urine    Myalgia    Myocardial infarction (HCC)    Obesity    Pain in joints    Retinal detachment    Unsteady gait    UTI (urinary tract infection)    Vision loss of left eye    Vomiting    Past Surgical History:  Procedure Laterality Date   ABDOMINAL HYSTERECTOMY  1977   BREAST BIOPSY     BREAST LUMPECTOMY     x5   CARDIAC CATHETERIZATION N/A 02/08/2016   Procedure: Left Heart Cath and Coronary Angiography;  Surgeon: Lyn Records, MD;  Location: [facility] INVASIVE CV LAB;  Service: Cardiovascular;  Laterality: N/A;   CARDIAC CATHETERIZATION N/A 02/11/2016   Procedure: Coronary Stent Intervention;  Surgeon: Lyn Records, MD;  Location: [facility] INVASIVE CV LAB;  Service: Cardiovascular;  Laterality: N/A;   CATARACT EXTRACTION, BILATERAL     CERVICAL LAMINECTOMY     DENTAL SURGERY  1952   EVACUATION BREAST HEMATOMA Right 01/09/2017   Procedure: EVACUATION RIGHT MASTECTOMY FLUID COLLECTION;  Surgeon: Emelia Loron, MD;  Location: [facility] OR;  Service:  General;  Laterality: Right;   FOOT SURGERY     NECK SURGERY     THYROIDECTOMY  1957   TOTAL HIP ARTHROPLASTY  2012   left   TOTAL KNEE ARTHROPLASTY Right 05/18/2017   Procedure: RIGHT TOTAL KNEE ARTHROPLASTY;  Surgeon: Beverely Low, MD;  Location: [facility] OR;  Service: Orthopedics;  Laterality: Right;   TOTAL MASTECTOMY Right 11/27/2016   TOTAL MASTECTOMY Right 11/27/2016   Procedure: RIGHT TOTAL MASTECTOMY;  Surgeon: Emelia Loron, MD;  Location: [facility] OR;  Service: General;  Laterality: Right;    Allergies  Allergies  Allergen Reactions   Azithromycin     Other reaction(s): Unknown Other reaction(s): Unknown   Other Other (See Comments)    SOME OF THE "MYCINS"...UNKNOWN REACTION.   Adhesive [Tape] Rash   Latex Rash     Labs/Other Studies Reviewed    The following studies were reviewed today:  Cardiac Studies & Procedures   CARDIAC CATHETERIZATION  CARDIAC CATHETERIZATION 02/11/2016  Narrative  Mid RCA chronic total occlusion collateralized from the left coronary artery.  Successful overlapping stents from the distal RCA no the margin of the PDA back to the proximal RCA using two 38 mm long Synergy drug-eluting stents postdilated to 3.0 mm in diameter with 100% stenosis reduced to 0%. Grade 3 flow was noted.  Angio-Seal was  used for successful hemostasis.  RECOMMENDATIONS:   Discharge in a.m. on aspirin and Plavix.  Ambulate later this evening  Findings Coronary Findings Diagnostic  Dominance: Right  Left Anterior Descending  First Diagonal Branch Vessel is small in size.  Second Diagonal Branch Vessel is small in size.  Third Diagonal Branch Vessel is small in size.  Ramus Intermedius  Left Circumflex  First Obtuse Marginal Branch Vessel is small in size.  Second Obtuse Marginal Branch Vessel is small in size.  Right Coronary Artery Culprit lesion. The lesion is eccentric with left-to-right collateral flow. The lesion was not previously  treated.  Right Posterior Descending Artery  Right Posterior Atrioventricular Artery Vessel is small in size.  Second Right Posterolateral Branch Vessel is small in size.  Third Right Posterolateral Branch Vessel is small in size.  Intervention  Mid RCA lesion Angioplasty Lesion length: 20 mm. Lesion crossed with guidewire using a WIRE FIGHTER CROSSING. Pre-stent angioplasty was performed using a BALLOON [place] MOZEC 2.5X13. A STENT SYNERGY DES [id number] drug eluting stent was successfully placed, and overlaps previously placed stent. Stent strut is well apposed. Post-stent angioplasty was performed using a BALLOON [place] EUPHORA RX3.0X27. There is no pre-interventional antegrade distal flow (TIMI 0).  The post-interventional distal flow is normal (TIMI 3). The intervention was successful . No complications occurred at this lesion. Pressure wire/FFR was not performed on the lesion . IVUS was not performed on the lesion . There is a 0% residual stenosis post intervention.  Dist RCA lesion Angioplasty Lesion crossed with guidewire using a WIRE FIGHTER CROSSING. Pre-stent angioplasty was performed using a BALLOON [place] MOZEC 2.5X13. A STENT SYNERGY DES 2.5X38 drug eluting stent was successfully placed, and does not overlap previously placed stent. Stent strut is well apposed. Post-stent angioplasty was performed using a BALLOON [place] EUPHORA RX3.0X27. Maximum pressure: 14 atm. There is no pre-interventional antegrade distal flow (TIMI 0).  The post-interventional distal flow is normal (TIMI 3). The intervention was successful . No complications occurred at this lesion. Pressure wire/FFR was not performed on the lesion . IVUS was not performed on the lesion . There is a 0% residual stenosis post intervention.   CARDIAC CATHETERIZATION  CARDIAC CATHETERIZATION 02/08/2016  Narrative  Prox LAD to Mid LAD lesion, 50 %stenosed.   Significant coronary artery disease with subtotally occluded mid right  coronary collateralized from the left coronary artery (LAD and circumflex).  Severe ostial obstruction in the second diagonal, 75% stenosis in the first diagonal, 70% stenosis within a region of tortuosity in the ramus intermedius, and 50% narrowing in the proximal to mid LAD. The distal LAD is diffusely diseased without focal obstruction.  Low normal LV function with EF estimated to be 50%.  Failed PTCA of the mid right coronary due to poor guide catheter support.  RECOMMENDATION:  Begin dual antiplatelet therapy.  Re-attempt PCI of the right coronary from the femoral approach on 02/11/2016. Will discuss with CTO team as well.  Intensify anti-ischemic therapy.  Findings Coronary Findings Diagnostic  Dominance: Right  Left Anterior Descending  First Diagonal Branch Vessel is small in size.  Second Diagonal Branch Vessel is small in size.  Third Diagonal Branch Vessel is small in size.  Ramus Intermedius  Left Circumflex  First Obtuse Marginal Branch Vessel is small in size.  Second Obtuse Marginal Branch Vessel is small in size.  Right Coronary Artery Culprit lesion. The lesion is eccentric with left-to-right collateral flow. The lesion was not previously treated.  Right Posterior Descending  Artery  Right Posterior Atrioventricular Artery Vessel is small in size.  Second Right Posterolateral Branch Vessel is small in size.  Third Right Posterolateral Branch Vessel is small in size.  Intervention  No interventions have been documented.   STRESS TESTS  MYOCARDIAL PERFUSION IMAGING 12/30/2015  Narrative  The left ventricular ejection fraction is moderately decreased (30-44%).  Nuclear stress EF: 43%. The LV is moderately dilated.  There was no ST segment deviation noted during stress.  This is a low risk study. No ischemia and no evidence of infarctoin .   ECHOCARDIOGRAM  ECHOCARDIOGRAM COMPLETE 05/12/2021  Narrative ECHOCARDIOGRAM  REPORT    Patient Name:   NORIS HAGHIGHI [facility] Date of Exam: 05/12/2021 Medical Rec #:  [id number]      Height:       68.0 in Accession #:    [id number]     Weight:       175.7 lb Date of Birth:  1928/03/05      BSA:          1.934 m Patient Age:    89 years       BP:           145/81 mmHg Patient Gender: F              HR:           64 bpm. Exam Location:  Inpatient  Procedure: 2D Echo, Cardiac Doppler and Color Doppler  Indications:    Acute ischemic heart disease, unspecified I24.9  History:        Patient has prior history of Echocardiogram examinations, most recent 12/12/2020. CHF, Previous Myocardial Infarction and CAD; Risk Factors:Hypertension.  Sonographer:    Eulah Pont RDCS Referring Phys: [id number] CARRIEL T NIPP  IMPRESSIONS   1. Left ventricular ejection fraction, by estimation, is 50 to 55%. Left ventricular ejection fraction by 2D MOD biplane is 50.1 %. The left ventricle has low normal function. The left ventricle demonstrates regional wall motion abnormalities (see scoring diagram/findings for description). There is mild left ventricular hypertrophy. Left ventricular diastolic parameters are consistent with Grade I diastolic dysfunction (impaired relaxation). There is moderate hypokinesis of the left ventricular, basal inferoseptal wall and inferior wall. The average left ventricular global longitudinal strain is -18.7 %. The global longitudinal strain is mildly abnormal with predominant basal and inferoseptal strain abnormalities. 2. Right ventricular systolic function is normal. The right ventricular size is normal. There is normal pulmonary artery systolic pressure. The estimated right ventricular systolic pressure is 29.4 mmHg. 3. Left atrial size was mildly dilated. 4. The mitral valve is grossly normal. Trivial mitral valve regurgitation. 5. The non-coronary cusp appears fixed. The aortic valve is abnormal. There is mild calcification of the aortic valve. Aortic valve  regurgitation is not visualized. 6. Aortic dilatation noted. There is mild dilatation of the aortic root, measuring 42 mm. There is borderline dilatation of the ascending aorta, measuring 38 mm. 7. The inferior vena cava is normal in size with greater than 50% respiratory variability, suggesting right atrial pressure of 3 mmHg. 8. Cannot exclude a small PFO.  Comparison(s): Changes from prior study are noted. 12/12/2020: LVEF 55-60%, RVSP 48 mmHg.  FINDINGS Left Ventricle: Left ventricular ejection fraction, by estimation, is 50 to 55%. Left ventricular ejection fraction by 2D MOD biplane is 50.1 %. The left ventricle has low normal function. The left ventricle demonstrates regional wall motion abnormalities. Moderate hypokinesis of the left ventricular, basal inferoseptal wall and inferior wall. The average left  ventricular global longitudinal strain is -18.7 %. The global longitudinal strain is abnormal. The left ventricular internal cavity size was normal in size. There is mild left ventricular hypertrophy. Left ventricular diastolic parameters are consistent with Grade I diastolic dysfunction (impaired relaxation). Normal left ventricular filling pressure.  Right Ventricle: The right ventricular size is normal. No increase in right ventricular wall thickness. Right ventricular systolic function is normal. There is normal pulmonary artery systolic pressure. The tricuspid regurgitant velocity is 2.57 m/s, and with an assumed right atrial pressure of 3 mmHg, the estimated right ventricular systolic pressure is 29.4 mmHg.  Left Atrium: Left atrial size was mildly dilated.  Right Atrium: Right atrial size was normal in size.  Pericardium: There is no evidence of pericardial effusion.  Mitral Valve: The mitral valve is grossly normal. Trivial mitral valve regurgitation.  Tricuspid Valve: The tricuspid valve is grossly normal. Tricuspid valve regurgitation is mild.  Aortic Valve: The  non-coronary cusp appears fixed. The aortic valve is abnormal. There is mild calcification of the aortic valve. Aortic valve regurgitation is not visualized.  Pulmonic Valve: The pulmonic valve was grossly normal. Pulmonic valve regurgitation is trivial.  Aorta: Aortic dilatation noted. There is mild dilatation of the aortic root, measuring 42 mm. There is borderline dilatation of the ascending aorta, measuring 38 mm.  Venous: The inferior vena cava is normal in size with greater than 50% respiratory variability, suggesting right atrial pressure of 3 mmHg.  IAS/Shunts: The interatrial septum is aneurysmal. Cannot exclude a small PFO.   LEFT VENTRICLE PLAX 2D                        Biplane EF (MOD) LVIDd:         5.20 cm         LV Biplane EF:   Left LVIDs:         3.60 cm                          ventricular LV PW:         1.20 cm                          ejection LV IVS:        1.10 cm                          fraction by LVOT diam:     2.10 cm                          2D MOD LV SV:         56                               biplane is LV SV Index:   29                               50.1 %. LVOT Area:     3.46 cm Diastology LV e' medial:  3.00 cm/s LV Volumes (MOD)               LV e' lateral: 5.03 cm/s LV vol d, MOD    118.0 ml A2C:  2D LV vol d, MOD    145.0 ml      Longitudinal A4C:                           Strain LV vol s, MOD    55.5 ml       2D Strain GLS  -17.2 % A2C:                           (A2C): LV vol s, MOD    73.4 ml       2D Strain GLS  -18.7 % A4C:                           (A3C): LV SV MOD A2C:   62.5 ml       2D Strain GLS  -20.1 % LV SV MOD A4C:   145.0 ml      (A4C): LV SV MOD BP:    65.6 ml       2D Strain GLS  -18.7 % Avg:  RIGHT VENTRICLE RV S prime:     14.90 cm/s TAPSE (M-mode): 2.4 cm  LEFT ATRIUM             Index        RIGHT ATRIUM           Index LA diam:        3.30 cm 1.71 cm/m   RA Area:     14.00 cm LA Vol  (A2C):   67.6 ml 34.95 ml/m  RA Volume:   33.20 ml  17.16 ml/m LA Vol (A4C):   66.8 ml 34.53 ml/m LA Biplane Vol: 68.0 ml 35.15 ml/m AORTIC VALVE             PULMONIC VALVE LVOT Vmax:   71.50 cm/s  PR End Diast Vel: 2.84 msec LVOT Vmean:  50.300 cm/s LVOT VTI:    0.161 m  AORTA Ao Root diam: 4.30 cm Ao Asc diam:  3.80 cm  TRICUSPID VALVE TR Peak grad:   26.4 mmHg TR Vmax:        257.00 cm/s  SHUNTS Systemic VTI:  0.16 m Systemic Diam: 2.10 cm  Zoila Shutter MD Electronically signed by Zoila Shutter MD Signature Date/Time: 05/12/2021/11:47:51 AM    Final            Recent Labs: No results found for requested labs within last 365 days.  Recent Lipid Panel    Component Value Date/Time   CHOL 82 12/13/2020 0536   CHOL 116 04/16/2017 1037   TRIG 31 12/13/2020 0536   HDL 40 (L) 12/13/2020 0536   HDL 45 04/16/2017 1037   CHOLHDL 2.1 12/13/2020 0536   VLDL 6 12/13/2020 0536   LDLCALC 36 12/13/2020 0536   LDLCALC 57 04/16/2017 1037    History of Present Illness    89 year old female with the above past medical history including CAD s/p PCI, DES x 2 overlapping-RCA in 2017, chronic heart failure with mildly reduced EF, mild dilation of aortic root, hypertension, hyperlipidemia, GERD, and breast cancer.  Cardiac catheterization in 2017 revealed severe multivessel disease with subtotally occluded mid RCA followed by 2 to grade stenoses in the distal RCA with collaterals from LAD and left circumflex, 95% stenosis of ostial second diagonal, 75% stenosis of proximal first diagonal, 70% stenosis of the RI, 50% stenosis of the proximal  to mid LAD followed by 70% stenosis of the distal LAD.  PCI was attempted at that time but PTCA failed due to for guide catheter support.  She returned to the Cath Lab and 02/2016 underwent successful PCI with DES x 2 overlapping-RCA.  Echocardiogram 01/31/2016 showed EF 40 to 45%, moderate diffuse hypokinesis, G1 DD, trivial AI.  She was hospitalized  in 12/2020 in the setting of NSTEMI, UTI associated altered mental status.  Echocardiogram at that time showed EF 55 to 60%, normal wall motion.  Cardiac catheterization was attempted, however, patient was not cooperative and procedure was aborted. She was hospitalized in 05/2021 in the setting of NSTEMI.  Repeat echocardiogram showed EF 50 to 55%, moderate hypokinesis of the basal inferior septal wall, and inferior walls, G1 DD.  Given prior cath findings, advanced age, the decision was made to treat medically.  She was last seen in the office on 02/21/2022 and was stable overall from a cardiac standpoint.  She denied symptoms concerning for angina.  She saw her PCP on 08/01/2022 following a syncopal episode.  MRI of the brain was negative for acute process.  She was advised to follow-up with cardiology as an outpatient.  She presents today for follow-up accompanied by her friend.  Since her last visit she has been stable overall from a cardiac standpoint. Apparently she hit her head on her car door approximately 1 year ago.  She never sought medical attention following this.  She had an episode of loss of consciousness that occurred approximately 6 weeks ago where she woke up on the floor.  Approximately 2 weeks ago she was washing her hair in her kitchen sink and "woke up" trying to pull herself up the floor with the kitchen door handle. She denies any recurrent syncope though she does note ongoing dizziness, mild dyspnea on exertion.  Home Medications    Current Outpatient Medications  Medication Sig Dispense Refill   acetaminophen (TYLENOL) 650 MG CR tablet Take 650 mg by mouth every 8 (eight) hours as needed for pain.     aspirin EC 81 MG tablet Take 81 mg by mouth daily.     Carboxymethylcellulose Sodium (REFRESH TEARS OP) Apply 1-2 drops to eye daily.     Cholecalciferol (VITAMIN D3) 125 MCG (5000 UT) CAPS Take 1 capsule by mouth daily.     diazepam (VALIUM) 2 MG tablet Take 1 tablet (2 mg total)  by mouth every 6 (six) hours as needed for anxiety. 2 tablet 0   Diclofenac Sodium CR 100 MG 24 hr tablet Take 100 mg by oral route.     Glycerin-Polysorbate 80 (REFRESH DRY EYE THERAPY OP)      isosorbide mononitrate (IMDUR) 30 MG 24 hr tablet TAKE 1 TABLET DAILY 90 tablet 3   levothyroxine (SYNTHROID, LEVOTHROID) 125 MCG tablet Take 125 mcg by mouth daily before breakfast.     metoprolol succinate (TOPROL-XL) 100 MG 24 hr tablet TAKE 1 TABLET DAILY 90 tablet 3   nitroGLYCERIN (NITROSTAT) 0.4 MG SL tablet Place 1 tablet (0.4 mg total) under the tongue every 5 (five) minutes x 3 doses as needed for chest pain. 30 tablet 12   pantoprazole (PROTONIX) 40 MG tablet Take 40 mg by mouth daily.     rosuvastatin (CRESTOR) 10 MG tablet Take 10 mg by mouth every morning.     No current facility-administered medications for this visit.     Review of Systems    She denies chest pain, palpitation, pnd, orthopnea, n,  v , weight gain, or early satiety. All other systems reviewed and are otherwise negative except as noted above.   Physical Exam    VS:  BP 113/67 (BP Location: Left Arm, Cuff Size: Normal)   Pulse 61   Ht 5\' 6"  (1.676 m)   Wt 172 lb 9.6 oz (78.3 kg)   SpO2 96%   BMI 27.86 kg/m  GEN: Well nourished, well developed, in no acute distress. HEENT: normal. Neck: Supple, no JVD, carotid bruits, or masses. Cardiac: RRR, no murmurs, rubs, or gallops. No clubbing, cyanosis, edema.  Radials/DP/PT 2+ and equal bilaterally.  Respiratory:  Respirations regular and unlabored, clear to auscultation bilaterally. GI: Soft, nontender, nondistended, BS + x 4. MS: no deformity or atrophy. Skin: warm and dry, no rash. Neuro:  Strength and sensation are intact. Psych: Normal affect.  Orthostatic VS for the past 24 hrs (Last 3 readings):  BP- Lying Pulse- Lying BP- Sitting Pulse- Sitting BP- Standing at 0 minutes Pulse- Standing at 0 minutes BP- Standing at 3 minutes Pulse- Standing at 3 minutes   08/10/22 1222 113/67 61 142/79 69 150/84 70 142/79 73    Accessory Clinical Findings    ECG personally reviewed by me today -sinus rhythm, 64 bpm, sinus arrhythmia, first-degree AV block- no acute changes.   Lab Results  Component Value Date   WBC 5.9 05/13/2021   HGB 12.5 05/13/2021   HCT 39.0 05/13/2021   MCV 94.7 05/13/2021   PLT 127 (L) 05/13/2021   Lab Results  Component Value Date   CREATININE 0.99 05/13/2021   BUN 24 (H) 05/13/2021   NA 135 05/13/2021   K 3.4 (L) 05/13/2021   CL 102 05/13/2021   CO2 25 05/13/2021   Lab Results  Component Value Date   ALT 11 12/11/2020   AST 15 12/11/2020   ALKPHOS 83 12/11/2020   BILITOT 0.9 12/11/2020   Lab Results  Component Value Date   CHOL 82 12/13/2020   HDL 40 (L) 12/13/2020   LDLCALC 36 12/13/2020   TRIG 31 12/13/2020   CHOLHDL 2.1 12/13/2020    Lab Results  Component Value Date   HGBA1C 5.1 05/12/2021    Assessment & Plan    1. Syncope:/dizziness She has had multiple syncopal episodes over the past year.  Recent MRI was unremarkable.  She has ongoing dizziness, denies any palpitations, chest pain, or syncope in the last 2 weeks. Will check 30-day event monitor.  Will repeat echocardiogram.  Will update carotid ultrasound. Orthostatics negative in office today.  Encouraged adequate hydration, gradual position changes. Discussed ED precautions.    2. CAD: S/p PCI, DES x 2 overlapping-RCA in 2017.  She has noted mild dyspnea on exertion, otherwise, stable with no anginal symptoms.  Repeat echo pending as above.  Continue aspirin, metoprolol, Imdur, and Crestor.  3. Chronic heart failure with mildly reduced EF:  Most recent echo in 05/2021 showed EF 50 to 55%, moderate hypokinesis of the basal inferior septal wall, and inferior walls, G1 DD.  She has stable nonpitting bilateral lower extremity edema, this is largely dependent, mild dyspnea on exertion, she denies PND, orthopnea, weight gain.  Repeat echo pending as  above.  Continue metoprolol.  4. Mild dilation of aortic root: Measured 42 mm on most recent echo in 05/2021.  Repeat echo pending.   5. Hypertension: BP well controlled. Continue current antihypertensive regimen.   6. Hyperlipidemia: LDL was 57 in 05/2021.  Monitored and managed per PCP.  Continue Crestor.  7. Disposition: Follow-up in 2 to 3 months with APP, 6 months with Dr. Royann Shivers.   Joylene Grapes, NP 08/10/2022, 12:24 PM

## 2022-08-10 NOTE — Progress Notes (Signed)
Patient enrolled for [employer] to ship a 30 day cardiac event monitor to her address on file.  Dr.Croitoru to read.  Letter with instructions mailed to patient.

## 2022-08-10 NOTE — Patient Instructions (Addendum)
Medication Instructions:  Your physician recommends that you continue on your current medications as directed. Please refer to the Current Medication list given to you today.  *If you need a refill on your cardiac medications before your next appointment, please call your pharmacy*   Lab Work: NONE ordered at this time of appointment   If you have labs (blood work) drawn today and your tests are completely normal, you will receive your results only by: MyChart Message (if you have MyChart) OR A paper copy in the mail If you have any lab test that is abnormal or we need to change your treatment, we will call you to review the results.   Testing/Procedures: Your physician has requested that you have an echocardiogram. Echocardiography is a painless test that uses sound waves to create images of your heart. It provides your doctor with information about the size and shape of your heart and how well your heart's chambers and valves are working. This procedure takes approximately one hour. There are no restrictions for this procedure. Please do NOT wear cologne, perfume, aftershave, or lotions (deodorant is allowed). Please arrive 15 minutes prior to your appointment time.  Your physician has requested that you have a carotid duplex. This test is an ultrasound of the carotid arteries in your neck. It looks at blood flow through these arteries that supply the brain with blood. Allow one hour for this exam. There are no restrictions or special instructions.   [employer] Cardiac Event Monitor Instructions  Your physician has requested you wear your cardiac event monitor for 30 days,. [employer] may call or text to confirm a shipping address. The monitor will be sent to a land address via UPS. [employer] will not ship a monitor to a PO BOX. It typically takes 3-5 days to receive your monitor after it has been enrolled. [employer] will assist with USPS tracking if your package is delayed. The  telephone number for [employer] is [phone]. Once you have received your monitor, please review the enclosed instructions. Instruction tutorials can also be viewed under help and settings on the enclosed cell phone. Your monitor has already been registered assigning a specific monitor serial # to you.  Billing and Self Pay Discount Information  [employer] has been provided the insurance information we had on file for you.  If your insurance has been updated, please call [employer] at [phone] to provide them with your updated insurance information.   [employer] offers a discounted Self Pay option for patients who have insurance that does not cover their cardiac event monitor or patients without insurance.  The discounted cost of a Self Pay Cardiac Event Monitor would be $225.00 , if the patient contacts [employer] at [phone] within 7 days of applying the monitor to make payment arrangements.  If the patient does not contact [employer] within 7 days of applying the monitor, the cost of the cardiac event monitor will be $350.00.  Applying the monitor  Remove cell phone from case and turn it on. The cell phone works as IT consultant and needs to be within [employer] of you at all times. The cell phone will need to be charged on a daily basis. We recommend you plug the cell phone into the enclosed charger at your bedside table every night.  Monitor batteries: You will receive two monitor batteries labelled #1 and #2. These are your recorders. Plug battery #2 onto the second connection on the enclosed charger. Keep one battery on the charger at all times. This  will keep the monitor battery deactivated. It will also keep it fully charged for when you need to switch your monitor batteries. A small light will be blinking on the battery emblem when it is charging. The light on the battery emblem will remain on when the battery is fully charged.  Open package of a Monitor strip. Insert  battery #1 into black hood on strip and gently squeeze monitor battery onto connection as indicated in instruction booklet. Set aside while preparing skin.  Choose location for your strip, vertical or horizontal, as indicated in the instruction booklet. Shave to remove all hair from location. There cannot be any lotions, oils, powders, or colognes on skin where monitor is to be applied. Wipe skin clean with enclosed Saline wipe. Dry skin completely.  Peel paper labeled #1 off the back of the Monitor strip exposing the adhesive. Place the monitor on the chest in the vertical or horizontal position shown in the instruction booklet. One arrow on the monitor strip must be pointing upward. Carefully remove paper labeled #2, attaching remainder of strip to your skin. Try not to create any folds or wrinkles in the strip as you apply it.  Firmly press and release the circle in the center of the monitor battery. You will hear a small beep. This is turning the monitor battery on. The heart emblem on the monitor battery will light up every 5 seconds if the monitor battery in turned on and connected to the patient securely. Do not push and hold the circle down as this turns the monitor battery off. The cell phone will locate the monitor battery. A screen will appear on the cell phone checking the connection of your monitor strip. This may read poor connection initially but change to good connection within the next minute. Once your monitor accepts the connection you will hear a series of 3 beeps followed by a climbing crescendo of beeps. A screen will appear on the cell phone showing the two monitor strip placement options. Touch the picture that demonstrates where you applied the monitor strip.  Your monitor strip and battery are waterproof. You are able to shower, bathe, or swim with the monitor on. They just ask you do not submerge deeper than 3 feet underwater. We recommend removing the monitor if you  are swimming in a lake, river, or ocean.  Your monitor battery will need to be switched to a fully charged monitor battery approximately once a week. The cell phone will alert you of an action which needs to be made.  On the cell phone, tap for details to reveal connection status, monitor battery status, and cell phone battery status. The green dots indicates your monitor is in good status. A red dot indicates there is something that needs your attention.  To record a symptom, click the circle on the monitor battery. In 30-60 seconds a list of symptoms will appear on the cell phone. Select your symptom and tap save. Your monitor will record a sustained or significant arrhythmia regardless of you clicking the button. Some patients do not feel the heart rhythm irregularities. [employer] will notify us of any serious or critical events.  Refer to instruction booklet for instructions on switching batteries, changing strips, the Do not disturb or Pause features, or any additional questions.  Call [employer] at [phone], to confirm your monitor is transmitting and record your baseline. They will answer any questions you may have regarding the monitor instructions at that time.  Returning the monitor to  [employer]  Place all equipment back into blue box. Peel off strip of paper to expose adhesive and close box securely. There is a prepaid UPS shipping label on this box. Drop in a UPS drop box, or at a UPS facility like [employer]. You may also contact [employer] to arrange UPS to pick up monitor package at your home.    Follow-Up: At [facility], you and your health needs are our priority.  As part of our continuing mission to provide you with exceptional heart care, we have created designated Provider Care Teams.  These Care Teams include your primary Cardiologist (physician) and Advanced Practice Providers (APPs -  Physician Assistants and Nurse Practitioners) who all work  together to provide you with the care you need, when you need it.  We recommend signing up for the patient portal called "MyChart".  Sign up information is provided on this After Visit Summary.  MyChart is used to connect with patients for Virtual Visits (Telemedicine).  Patients are able to view lab/test results, encounter notes, upcoming appointments, etc.  Non-urgent messages can be sent to your provider as well.   To learn more about what you can do with MyChart, go to [email or web address].    Your next appointment:   2-3 month(s) Bernadene Person NP) 6 months (Dr. Royann Shivers)  Provider:   Bernadene Person, NP        Other Instructions

## 2022-08-15 DIAGNOSIS — R55 Syncope and collapse: Secondary | ICD-10-CM

## 2022-08-15 DIAGNOSIS — I2510 Atherosclerotic heart disease of native coronary artery without angina pectoris: Secondary | ICD-10-CM

## 2022-08-15 DIAGNOSIS — I77810 Thoracic aortic ectasia: Secondary | ICD-10-CM | POA: Diagnosis not present

## 2022-08-15 DIAGNOSIS — R42 Dizziness and giddiness: Secondary | ICD-10-CM

## 2022-08-17 ENCOUNTER — Ambulatory Visit: Payer: Medicare Other | Admitting: Podiatry

## 2022-08-17 DIAGNOSIS — M79671 Pain in right foot: Secondary | ICD-10-CM

## 2022-08-17 DIAGNOSIS — M2142 Flat foot [pes planus] (acquired), left foot: Secondary | ICD-10-CM

## 2022-08-17 DIAGNOSIS — L84 Corns and callosities: Secondary | ICD-10-CM

## 2022-08-17 NOTE — Progress Notes (Signed)
Patient presents today to pick up custom orthotics.   Patient was dispensed 1 pair of custom orthotics the fit was unsatifactory. Patients toe hung over the end of orthotics by  half inch, sending orthotics back to get a size 10 W    Re-appointment for regularly scheduled diabetic foot care visits or if they should experience any trouble with the shoes or insoles.

## 2022-08-29 ENCOUNTER — Ambulatory Visit (HOSPITAL_COMMUNITY)
Admission: RE | Admit: 2022-08-29 | Discharge: 2022-08-29 | Disposition: A | Discharge status: Home / Self Care | Payer: Medicare Other | Source: Ambulatory Visit | Attending: Cardiovascular Disease | Admitting: Cardiovascular Disease

## 2022-08-29 DIAGNOSIS — I77810 Thoracic aortic ectasia: Secondary | ICD-10-CM | POA: Diagnosis not present

## 2022-08-29 DIAGNOSIS — R42 Dizziness and giddiness: Secondary | ICD-10-CM | POA: Diagnosis not present

## 2022-08-29 DIAGNOSIS — I2510 Atherosclerotic heart disease of native coronary artery without angina pectoris: Secondary | ICD-10-CM

## 2022-08-29 DIAGNOSIS — R55 Syncope and collapse: Secondary | ICD-10-CM | POA: Diagnosis not present

## 2022-08-30 ENCOUNTER — Telehealth: Payer: Self-pay

## 2022-08-30 NOTE — Telephone Encounter (Signed)
Spoke with pt. Pt was notified of VAS u/s ultrasound. Pt will continue current medication and f/u as planned.

## 2022-09-08 ENCOUNTER — Ambulatory Visit (HOSPITAL_COMMUNITY): Payer: Medicare Other

## 2022-09-08 ENCOUNTER — Other Ambulatory Visit (HOSPITAL_COMMUNITY): Payer: Medicare Other

## 2022-09-11 ENCOUNTER — Ambulatory Visit: Payer: Medicare Other

## 2022-09-11 ENCOUNTER — Telehealth: Payer: Self-pay | Admitting: Podiatry

## 2022-09-11 ENCOUNTER — Ambulatory Visit (HOSPITAL_COMMUNITY): Payer: Medicare Other | Attending: Nurse Practitioner

## 2022-09-11 DIAGNOSIS — R42 Dizziness and giddiness: Secondary | ICD-10-CM | POA: Insufficient documentation

## 2022-09-11 DIAGNOSIS — I77810 Thoracic aortic ectasia: Secondary | ICD-10-CM | POA: Insufficient documentation

## 2022-09-11 DIAGNOSIS — I2510 Atherosclerotic heart disease of native coronary artery without angina pectoris: Secondary | ICD-10-CM | POA: Insufficient documentation

## 2022-09-11 DIAGNOSIS — R55 Syncope and collapse: Secondary | ICD-10-CM | POA: Diagnosis not present

## 2022-09-11 LAB — ECHOCARDIOGRAM COMPLETE
Area-P 1/2: 2.83 cm2
S' Lateral: 3.8 cm

## 2022-09-11 NOTE — Telephone Encounter (Signed)
Pt came into office stating she got a call stating the orthotics where in.  I dispensed a pr of orthotics and they seemed to fit well and pt was satisfied with the fit. Breakin instructions were given verbally and hand written as well. If any pain pt is aware to not to continue to wear them and call us to get an appt.

## 2022-09-20 ENCOUNTER — Ambulatory Visit: Payer: Medicare Other | Attending: Nurse Practitioner

## 2022-09-20 DIAGNOSIS — R42 Dizziness and giddiness: Secondary | ICD-10-CM

## 2022-09-20 DIAGNOSIS — I77810 Thoracic aortic ectasia: Secondary | ICD-10-CM

## 2022-09-20 DIAGNOSIS — R55 Syncope and collapse: Secondary | ICD-10-CM

## 2022-09-20 DIAGNOSIS — I2510 Atherosclerotic heart disease of native coronary artery without angina pectoris: Secondary | ICD-10-CM

## 2022-10-03 ENCOUNTER — Other Ambulatory Visit (HOSPITAL_COMMUNITY): Payer: Medicare Other

## 2022-10-18 NOTE — Progress Notes (Signed)
Office Visit    Patient Name: Kathy Murphy Date of Encounter: 10/19/2022  Primary Care Provider:  Geoffry Paradise, MD Primary Cardiologist:  Thurmon Fair, MD  Chief Complaint    89 year old female with a history of CAD s/p PCI, DES x 2 overlapping-RCA in 2017, chronic heart failure with mildly reduced EF, mild dilation of aortic root, hypertension, hyperlipidemia, GERD, and breast cancer who presents for follow-up related to syncope.   Past Medical History    Past Medical History:  Diagnosis Date   Arthritis    Breast cancer (HCC)    Right   CAD (coronary artery disease)    Cancer (HCC)    thyroid   Chronic combined systolic and diastolic CHF (congestive heart failure) (HCC)    Dilated aortic root (HCC)    First degree AV block    GERD (gastroesophageal reflux disease)    GI bleed 1980   AFTER POLYP EXCISION   Heart disease, hypertensive    Hypercholesterolemia    Hypertension    Hypothyroidism    Incontinence of urine    Myalgia    Myocardial infarction (HCC)    Obesity    Pain in joints    Retinal detachment    Unsteady gait    UTI (urinary tract infection)    Vision loss of left eye    Vomiting    Past Surgical History:  Procedure Laterality Date   ABDOMINAL HYSTERECTOMY  1977   BREAST BIOPSY     BREAST LUMPECTOMY     x5   CARDIAC CATHETERIZATION N/A 02/08/2016   Procedure: Left Heart Cath and Coronary Angiography;  Surgeon: Lyn Records, MD;  Location: [facility] INVASIVE CV LAB;  Service: Cardiovascular;  Laterality: N/A;   CARDIAC CATHETERIZATION N/A 02/11/2016   Procedure: Coronary Stent Intervention;  Surgeon: Lyn Records, MD;  Location: [facility] INVASIVE CV LAB;  Service: Cardiovascular;  Laterality: N/A;   CATARACT EXTRACTION, BILATERAL     CERVICAL LAMINECTOMY     DENTAL SURGERY  1952   EVACUATION BREAST HEMATOMA Right 01/09/2017   Procedure: EVACUATION RIGHT MASTECTOMY FLUID COLLECTION;  Surgeon: Emelia Loron, MD;  Location: [facility] OR;  Service:  General;  Laterality: Right;   FOOT SURGERY     NECK SURGERY     THYROIDECTOMY  1957   TOTAL HIP ARTHROPLASTY  2012   left   TOTAL KNEE ARTHROPLASTY Right 05/18/2017   Procedure: RIGHT TOTAL KNEE ARTHROPLASTY;  Surgeon: Beverely Low, MD;  Location: [facility] OR;  Service: Orthopedics;  Laterality: Right;   TOTAL MASTECTOMY Right 11/27/2016   TOTAL MASTECTOMY Right 11/27/2016   Procedure: RIGHT TOTAL MASTECTOMY;  Surgeon: Emelia Loron, MD;  Location: [facility] OR;  Service: General;  Laterality: Right;    Allergies  Allergies  Allergen Reactions   Azithromycin     Other reaction(s): Unknown Other reaction(s): Unknown   Other Other (See Comments)    SOME OF THE "MYCINS"...UNKNOWN REACTION.   Adhesive [Tape] Rash   Latex Rash     Labs/Other Studies Reviewed    The following studies were reviewed today:  Cardiac Studies & Procedures   CARDIAC CATHETERIZATION  CARDIAC CATHETERIZATION 02/11/2016  Narrative  Mid RCA chronic total occlusion collateralized from the left coronary artery.  Successful overlapping stents from the distal RCA no the margin of the PDA back to the proximal RCA using two 38 mm long Synergy drug-eluting stents postdilated to 3.0 mm in diameter with 100% stenosis reduced to 0%. Grade 3 flow was noted.  Angio-Seal  was used for successful hemostasis.  RECOMMENDATIONS:   Discharge in a.m. on aspirin and Plavix.  Ambulate later this evening  Findings Coronary Findings Diagnostic  Dominance: Right  Left Anterior Descending  First Diagonal Branch Vessel is small in size.  Second Diagonal Branch Vessel is small in size.  Third Diagonal Branch Vessel is small in size.  Ramus Intermedius  Left Circumflex  First Obtuse Marginal Branch Vessel is small in size.  Second Obtuse Marginal Branch Vessel is small in size.  Right Coronary Artery Culprit lesion. The lesion is eccentric with left-to-right collateral flow. The lesion was not previously  treated.  Right Posterior Descending Artery  Right Posterior Atrioventricular Artery Vessel is small in size.  Second Right Posterolateral Branch Vessel is small in size.  Third Right Posterolateral Branch Vessel is small in size.  Intervention  Mid RCA lesion Angioplasty Lesion length: 20 mm. Lesion crossed with guidewire using a WIRE FIGHTER CROSSING. Pre-stent angioplasty was performed using a BALLOON [place] MOZEC 2.5X13. A STENT SYNERGY DES [id number] drug eluting stent was successfully placed, and overlaps previously placed stent. Stent strut is well apposed. Post-stent angioplasty was performed using a BALLOON [place] EUPHORA RX3.0X27. There is no pre-interventional antegrade distal flow (TIMI 0).  The post-interventional distal flow is normal (TIMI 3). The intervention was successful . No complications occurred at this lesion. Pressure wire/FFR was not performed on the lesion . IVUS was not performed on the lesion . There is a 0% residual stenosis post intervention.  Dist RCA lesion Angioplasty Lesion crossed with guidewire using a WIRE FIGHTER CROSSING. Pre-stent angioplasty was performed using a BALLOON [place] MOZEC 2.5X13. A STENT SYNERGY DES 2.5X38 drug eluting stent was successfully placed, and does not overlap previously placed stent. Stent strut is well apposed. Post-stent angioplasty was performed using a BALLOON [place] EUPHORA RX3.0X27. Maximum pressure: 14 atm. There is no pre-interventional antegrade distal flow (TIMI 0).  The post-interventional distal flow is normal (TIMI 3). The intervention was successful . No complications occurred at this lesion. Pressure wire/FFR was not performed on the lesion . IVUS was not performed on the lesion . There is a 0% residual stenosis post intervention.   CARDIAC CATHETERIZATION  CARDIAC CATHETERIZATION 02/08/2016  Narrative  Prox LAD to Mid LAD lesion, 50 %stenosed.   Significant coronary artery disease with subtotally occluded mid right  coronary collateralized from the left coronary artery (LAD and circumflex).  Severe ostial obstruction in the second diagonal, 75% stenosis in the first diagonal, 70% stenosis within a region of tortuosity in the ramus intermedius, and 50% narrowing in the proximal to mid LAD. The distal LAD is diffusely diseased without focal obstruction.  Low normal LV function with EF estimated to be 50%.  Failed PTCA of the mid right coronary due to poor guide catheter support.  RECOMMENDATION:  Begin dual antiplatelet therapy.  Re-attempt PCI of the right coronary from the femoral approach on 02/11/2016. Will discuss with CTO team as well.  Intensify anti-ischemic therapy.  Findings Coronary Findings Diagnostic  Dominance: Right  Left Anterior Descending  First Diagonal Branch Vessel is small in size.  Second Diagonal Branch Vessel is small in size.  Third Diagonal Branch Vessel is small in size.  Ramus Intermedius  Left Circumflex  First Obtuse Marginal Branch Vessel is small in size.  Second Obtuse Marginal Branch Vessel is small in size.  Right Coronary Artery Culprit lesion. The lesion is eccentric with left-to-right collateral flow. The lesion was not previously treated.  Right Posterior  Descending Artery  Right Posterior Atrioventricular Artery Vessel is small in size.  Second Right Posterolateral Branch Vessel is small in size.  Third Right Posterolateral Branch Vessel is small in size.  Intervention  No interventions have been documented.   STRESS TESTS  MYOCARDIAL PERFUSION IMAGING 12/30/2015  Narrative  The left ventricular ejection fraction is moderately decreased (30-44%).  Nuclear stress EF: 43%. The LV is moderately dilated.  There was no ST segment deviation noted during stress.  This is a low risk study. No ischemia and no evidence of infarctoin .   ECHOCARDIOGRAM  ECHOCARDIOGRAM COMPLETE 09/11/2022  Narrative ECHOCARDIOGRAM  REPORT    Patient Name:   Kathy Murphy [facility] Date of Exam: 09/11/2022 Medical Rec #:  [id number]      Height:       66.0 in Accession #:    [id number]     Weight:       172.6 lb Date of Birth:  Sep 01, 1927      BSA:          1.878 m Patient Age:    89 years       BP:           113/67 mmHg Patient Gender: F              HR:           64 bpm. Exam Location:  [address]  Procedure: 2D Echo, Cardiac Doppler, Color Doppler and 3D Echo  Indications:    Syncope R55  History:        Patient has prior history of Echocardiogram examinations, most recent 05/12/2021. CHF, CAD and Previous Myocardial Infarction; Risk Factors:Hypertension.  Sonographer:    Thurman Coyer RDCS Referring Phys: [phone] Gaylord Seydel C Noa Constante  IMPRESSIONS   1. Left ventricular ejection fraction, by estimation, is 55 to 60%. The left ventricle has normal function. The left ventricle has no regional wall motion abnormalities. Left ventricular diastolic parameters are consistent with Grade I diastolic dysfunction (impaired relaxation). 2. Right ventricular systolic function is normal. The right ventricular size is normal. There is normal pulmonary artery systolic pressure. 3. Left atrial size was moderately dilated. 4. The mitral valve is abnormal. Mild mitral valve regurgitation. No evidence of mitral stenosis. 5. Tricuspid valve regurgitation is moderate. 6. The aortic valve is tricuspid. Aortic valve regurgitation is mild. Aortic valve sclerosis/calcification is present, without any evidence of aortic stenosis. 7. Aortic dilatation noted. There is moderate dilatation of the aortic root, measuring 42 mm. There is dilatation of the ascending aorta, measuring 39 mm. 8. The inferior vena cava is normal in size with greater than 50% respiratory variability, suggesting right atrial pressure of 3 mmHg.  FINDINGS Left Ventricle: Left ventricular ejection fraction, by estimation, is 55 to 60%. The left ventricle has normal function. The  left ventricle has no regional wall motion abnormalities. The left ventricular internal cavity size was normal in size. There is no left ventricular hypertrophy. Left ventricular diastolic parameters are consistent with Grade I diastolic dysfunction (impaired relaxation).  Right Ventricle: The right ventricular size is normal. No increase in right ventricular wall thickness. Right ventricular systolic function is normal. There is normal pulmonary artery systolic pressure. The tricuspid regurgitant velocity is 2.50 m/s, and with an assumed right atrial pressure of 8 mmHg, the estimated right ventricular systolic pressure is 33.0 mmHg.  Left Atrium: Left atrial size was moderately dilated.  Right Atrium: Right atrial size was normal in size.  Pericardium: There is no evidence  of pericardial effusion.  Mitral Valve: The mitral valve is abnormal. There is mild thickening of the mitral valve leaflet(s). Mild mitral valve regurgitation. No evidence of mitral valve stenosis.  Tricuspid Valve: The tricuspid valve is normal in structure. Tricuspid valve regurgitation is moderate . No evidence of tricuspid stenosis.  Aortic Valve: The aortic valve is tricuspid. Aortic valve regurgitation is mild. Aortic valve sclerosis/calcification is present, without any evidence of aortic stenosis.  Pulmonic Valve: The pulmonic valve was normal in structure. Pulmonic valve regurgitation is mild. No evidence of pulmonic stenosis.  Aorta: Aortic dilatation noted. There is moderate dilatation of the aortic root, measuring 42 mm. There is dilatation of the ascending aorta, measuring 39 mm.  Venous: The inferior vena cava is normal in size with greater than 50% respiratory variability, suggesting right atrial pressure of 3 mmHg.  IAS/Shunts: No atrial level shunt detected by color flow Doppler.   LEFT VENTRICLE PLAX 2D LVIDd:         5.20 cm   Diastology LVIDs:         3.80 cm   LV e' medial:    3.00 cm/s LV PW:          1.10 cm   LV E/e' medial:  12.4 LV IVS:        1.10 cm   LV e' lateral:   5.11 cm/s LVOT diam:     2.30 cm   LV E/e' lateral: 7.3 LV SV:         64 LV SV Index:   34 LVOT Area:     4.15 cm  3D Volume EF: 3D EF:        61 % LV EDV:       196 ml LV ESV:       76 ml LV SV:        121 ml  RIGHT VENTRICLE RV Basal diam:  3.10 cm RV Mid diam:    2.80 cm RV S prime:     11.20 cm/s TAPSE (M-mode): 2.1 cm  LEFT ATRIUM             Index        RIGHT ATRIUM           Index LA diam:        4.40 cm 2.34 cm/m   RA Area:     16.00 cm LA Vol (A2C):   70.1 ml 37.32 ml/m  RA Volume:   37.90 ml  20.18 ml/m LA Vol (A4C):   44.5 ml 23.69 ml/m LA Biplane Vol: 57.2 ml 30.45 ml/m AORTIC VALVE LVOT Vmax:   64.40 cm/s LVOT Vmean:  42.000 cm/s LVOT VTI:    0.155 m  AORTA Ao Root diam: 4.20 cm Ao Asc diam:  3.90 cm  MITRAL VALVE                TRICUSPID VALVE MV Area (PHT): 2.83 cm     TR Peak grad:   25.0 mmHg MV Decel Time: 268 msec     TR Vmax:        250.00 cm/s MV E velocity: 37.20 cm/s MV A velocity: 104.00 cm/s  SHUNTS MV E/A ratio:  0.36         Systemic VTI:  0.16 m Systemic Diam: 2.30 cm  Charlton Haws MD Electronically signed by Charlton Haws MD Signature Date/Time: 09/11/2022/2:58:25 PM    Final    MONITORS  CARDIAC EVENT MONITOR 09/20/2022  Narrative   The  dominant rhythm is normal sinus rhythm with normal circadian variation   There are rare premature ventricular contractions.  There is a single 7 beat run of nonsustained ventricular tachycardia.   There is no significant tachyarrhythmia.  Specifically there is no atrial fibrillation.   There is no evidence of severe bradycardia, long pauses or high-grade atrioventricular block.  Mildly abnormal arrhythmia monitor.  There are rare PVCs and a single 7 beat run of nonsustained ventricular tachycardia.          Recent Labs: No results found for requested labs within last 365 days.  Recent Lipid Panel     Component Value Date/Time   CHOL 82 12/13/2020 0536   CHOL 116 04/16/2017 1037   TRIG 31 12/13/2020 0536   HDL 40 (L) 12/13/2020 0536   HDL 45 04/16/2017 1037   CHOLHDL 2.1 12/13/2020 0536   VLDL 6 12/13/2020 0536   LDLCALC 36 12/13/2020 0536   LDLCALC 57 04/16/2017 1037    History of Present Illness    89 year old female with the above past medical history including CAD s/p PCI, DES x 2 overlapping-RCA in 2017, chronic heart failure with mildly reduced EF, mild dilation of aortic root, hypertension, hyperlipidemia, GERD, and breast cancer.   Cardiac catheterization in 2017 revealed severe multivessel disease with subtotally occluded mid RCA followed by 2 to grade stenoses in the distal RCA with collaterals from LAD and left circumflex, 95% stenosis of ostial second diagonal, 75% stenosis of proximal first diagonal, 70% stenosis of the RI, 50% stenosis of the proximal to mid LAD followed by 70% stenosis of the distal LAD.  PCI was attempted at that time but PTCA failed due to for guide catheter support.  She returned to the Cath Lab and 02/2016 underwent successful PCI with DES x 2 overlapping-RCA.  Echocardiogram 01/31/2016 showed EF 40 to 45%, moderate diffuse hypokinesis, G1 DD, trivial AI.  She was hospitalized in 12/2020 in the setting of NSTEMI, UTI associated altered mental status.  Echocardiogram at that time showed EF 55 to 60%, normal wall motion. Cardiac catheterization was attempted, however, patient was not cooperative and procedure was aborted. She was hospitalized in 05/2021 in the setting of NSTEMI.  Repeat echocardiogram showed EF 50 to 55%, moderate hypokinesis of the basal inferior septal wall, and inferior walls, G1 DD.  Given prior cath findings, advanced age, the decision was made to treat medically.  She had a syncopal episode in 07/2022.  MRI of the brain was negative for acute process.  She was last seen in the office on 08/10/2018 form was stable from a cardiac standpoint.   She did note ongoing dizziness, denied any recurrent syncope. Orthostatics were negative. Repeat echocardiogram showed EF 55 to 60%, normally complete, no RWMA, G1 DD, normal RV, mild mitral valve regurgitation, mild aortic valve regurgitation, moderate dilation of the aortic root measuring 42 mm, mild dilation of the ascending aorta measuring 39 mm.  Carotid ultrasound showed 1 to 39% B ICA stenosis.  30-day event monitor showed predominantly sinus rhythm, one 7 beat run of NSVT, rare PVCs. She has ongoing intermittent dizziness, occasional tightness in her chest that is fleeting, she denies any significant palpitations, presyncope or syncope.    She presents today for follow-up accompanied by her friend.  Since her last visit she has been stable from a cardiac standpoint.  She has had ongoing intermittent dizziness, occasional tightness in her chest that is fleeting, she denies any significant palpitations, presyncope or syncope.  She does  note some generalized weakness, and states that she is now requiring a walker to ambulate.  She also notes progressive memory impairment.  Overall, it appears that her dizziness has been stable.   Home Medications    Current Outpatient Medications  Medication Sig Dispense Refill   acetaminophen (TYLENOL) 650 MG CR tablet Take 650 mg by mouth every 8 (eight) hours as needed for pain.     aspirin EC 81 MG tablet Take 81 mg by mouth daily.     Carboxymethylcellulose Sodium (REFRESH TEARS OP) Apply 1-2 drops to eye daily.     Cholecalciferol (VITAMIN D3) 125 MCG (5000 UT) CAPS Take 1 capsule by mouth daily.     diazepam (VALIUM) 2 MG tablet Take 1 tablet (2 mg total) by mouth every 6 (six) hours as needed for anxiety. 2 tablet 0   Diclofenac Sodium CR 100 MG 24 hr tablet Take 100 mg by oral route.     Glycerin-Polysorbate 80 (REFRESH DRY EYE THERAPY OP)      isosorbide mononitrate (IMDUR) 30 MG 24 hr tablet TAKE 1 TABLET DAILY 90 tablet 3   levothyroxine (SYNTHROID,  LEVOTHROID) 125 MCG tablet Take 125 mcg by mouth daily before breakfast.     metoprolol succinate (TOPROL-XL) 100 MG 24 hr tablet TAKE 1 TABLET DAILY 90 tablet 3   nitroGLYCERIN (NITROSTAT) 0.4 MG SL tablet Place 1 tablet (0.4 mg total) under the tongue every 5 (five) minutes x 3 doses as needed for chest pain. 30 tablet 12   pantoprazole (PROTONIX) 40 MG tablet Take 40 mg by mouth daily.     rosuvastatin (CRESTOR) 10 MG tablet Take 10 mg by mouth every morning.     No current facility-administered medications for this visit.     Review of Systems    She denies chest pain, dyspnea, pnd, orthopnea, n, v, syncope, edema, weight gain, or early satiety. All other systems reviewed and are otherwise negative except as noted above.   Physical Exam    VS:  BP (!) 142/67 (BP Location: Left Arm, Patient Position: Sitting, Cuff Size: Normal)   Pulse 67   Ht 5\' 8"  (1.727 m)   Wt 171 lb 9.6 oz (77.8 kg)   SpO2 96%   BMI 26.09 kg/m   GEN: Well nourished, well developed, in no acute distress. HEENT: normal. Neck: Supple, no JVD, carotid bruits, or masses. Cardiac: RRR, no murmurs, rubs, or gallops. No clubbing, cyanosis, edema.  Radials/DP/PT 2+ and equal bilaterally.  Respiratory:  Respirations regular and unlabored, clear to auscultation bilaterally. GI: Soft, nontender, nondistended, BS + x 4. MS: no deformity or atrophy. Skin: warm and dry, no rash. Neuro:  Strength and sensation are intact. Psych: Normal affect.  Accessory Clinical Findings    ECG personally reviewed by me today -    - no EKG in office today.   Lab Results  Component Value Date   WBC 5.9 05/13/2021   HGB 12.5 05/13/2021   HCT 39.0 05/13/2021   MCV 94.7 05/13/2021   PLT 127 (L) 05/13/2021   Lab Results  Component Value Date   CREATININE 0.99 05/13/2021   BUN 24 (H) 05/13/2021   NA 135 05/13/2021   K 3.4 (L) 05/13/2021   CL 102 05/13/2021   CO2 25 05/13/2021   Lab Results  Component Value Date   ALT 11  12/11/2020   AST 15 12/11/2020   ALKPHOS 83 12/11/2020   BILITOT 0.9 12/11/2020   Lab Results  Component Value Date  CHOL 82 12/13/2020   HDL 40 (L) 12/13/2020   LDLCALC 36 12/13/2020   TRIG 31 12/13/2020   CHOLHDL 2.1 12/13/2020    Lab Results  Component Value Date   HGBA1C 5.1 05/12/2021    Assessment & Plan    1. Syncope/dizziness She has had multiple syncopal episodes over the past year.  MRI was unremarkable.  Orthostatics were negative.  Echocardiogram showed EF 55 to 60%, normally complete, no RWMA, G1 DD, normal RV, mild mitral valve regurgitation, mild aortic valve regurgitation, moderate dilation of the aortic root measuring 42 mm, mild dilation of the ascending aorta measuring 39 mm.  Carotid ultrasound showed 1 to 39% B ICA stenosis.  30-day event monitor showed predominantly sinus rhythm, one 7 beat run of NSVT, rare PVCs. She has ongoing intermittent dizziness, occasional tightness in her chest that is fleeting, she denies any significant palpitations, presyncope or syncope.  Reviewed ED precautions, discussed possible loop recorder.  Patient is not interested at this time.  Encouraged adequate hydration, gradual position changes.   2. CAD: S/p PCI, DES x 2 overlapping-RCA in 2017.  She has noted occasional mild chest tightness that is fleeting.  No significant symptoms concerning for angina.  Recent echo reassuring.  Continue aspirin, metoprolol, Imdur, and Crestor as below.   3. Chronic heart failure with mildly reduced EF:  Most recent echo stable as above. Euvolemic and well compensated on exam. Continue metoprolol.   4. Mild dilation of aortic root: Most recent echo showed moderate dilation of the aortic root measuring 42 mm, mild dilation of the ascending aorta measuring 39 mm.  Stable.    5. Hypertension: BP well controlled. Continue current antihypertensive regimen.    6. Hyperlipidemia: LDL was 57 in 05/2021.  Monitored and managed per PCP.  She notes generalized  weakness, she is having to rely on a walker to ambulate.  Will trial brief statin holiday to see if this improves her lower extremity weakness.  If no improvement, resume Crestor.    7.  Memory impairment: She notices progressive memory impairment.  She states she has discussed this with her PCP but she feels that her symptoms have worsened recently.  I advised her to once again discuss this with her PCP.   8. Disposition: Follow-up as scheduled with Dr. Royann Shivers in 01/2023.   Joylene Grapes, NP 10/19/2022, 1:00 PM

## 2022-10-19 ENCOUNTER — Ambulatory Visit: Payer: Medicare Other | Attending: Nurse Practitioner | Admitting: Nurse Practitioner

## 2022-10-19 ENCOUNTER — Encounter: Payer: Self-pay | Admitting: Nurse Practitioner

## 2022-10-19 VITALS — BP 142/67 | HR 67 | Ht 68.0 in | Wt 171.6 lb

## 2022-10-19 DIAGNOSIS — I2510 Atherosclerotic heart disease of native coronary artery without angina pectoris: Secondary | ICD-10-CM | POA: Diagnosis not present

## 2022-10-19 DIAGNOSIS — R55 Syncope and collapse: Secondary | ICD-10-CM | POA: Diagnosis not present

## 2022-10-19 DIAGNOSIS — I10 Essential (primary) hypertension: Secondary | ICD-10-CM | POA: Diagnosis not present

## 2022-10-19 DIAGNOSIS — I77810 Thoracic aortic ectasia: Secondary | ICD-10-CM

## 2022-10-19 DIAGNOSIS — I5020 Unspecified systolic (congestive) heart failure: Secondary | ICD-10-CM

## 2022-10-19 DIAGNOSIS — E785 Hyperlipidemia, unspecified: Secondary | ICD-10-CM

## 2022-10-19 DIAGNOSIS — R42 Dizziness and giddiness: Secondary | ICD-10-CM

## 2022-10-19 DIAGNOSIS — I5022 Chronic systolic (congestive) heart failure: Secondary | ICD-10-CM | POA: Diagnosis not present

## 2022-10-19 NOTE — Patient Instructions (Signed)
Medication Instructions:  Hold Crestor for a couple weeks. If weakness doesn't improve, resume medication.  *If you need a refill on your cardiac medications before your next appointment, please call your pharmacy*   Lab Work: NONE ordered at this time of appointment     Testing/Procedures: NONE ordered at this time of appointment     Follow-Up: At [facility], you and your health needs are our priority.  As part of our continuing mission to provide you with exceptional heart care, we have created designated Provider Care Teams.  These Care Teams include your primary Cardiologist (physician) and Advanced Practice Providers (APPs -  Physician Assistants and Nurse Practitioners) who all work together to provide you with the care you need, when you need it.  We recommend signing up for the patient portal called "MyChart".  Sign up information is provided on this After Visit Summary.  MyChart is used to connect with patients for Virtual Visits (Telemedicine).  Patients are able to view lab/test results, encounter notes, upcoming appointments, etc.  Non-urgent messages can be sent to your provider as well.   To learn more about what you can do with MyChart, go to [email or web address].    Your next appointment:    Keep follow up   Provider:   Thurmon Fair, MD

## 2022-11-07 DIAGNOSIS — M25551 Pain in right hip: Secondary | ICD-10-CM | POA: Diagnosis not present

## 2022-11-07 DIAGNOSIS — R2689 Other abnormalities of gait and mobility: Secondary | ICD-10-CM | POA: Diagnosis not present

## 2022-11-07 DIAGNOSIS — R1031 Right lower quadrant pain: Secondary | ICD-10-CM | POA: Diagnosis not present

## 2022-11-07 DIAGNOSIS — N390 Urinary tract infection, site not specified: Secondary | ICD-10-CM | POA: Diagnosis not present

## 2022-11-28 DIAGNOSIS — I10 Essential (primary) hypertension: Secondary | ICD-10-CM | POA: Diagnosis not present

## 2022-11-28 DIAGNOSIS — E039 Hypothyroidism, unspecified: Secondary | ICD-10-CM | POA: Diagnosis not present

## 2022-11-28 DIAGNOSIS — E785 Hyperlipidemia, unspecified: Secondary | ICD-10-CM | POA: Diagnosis not present

## 2022-11-28 DIAGNOSIS — R7989 Other specified abnormal findings of blood chemistry: Secondary | ICD-10-CM | POA: Diagnosis not present

## 2022-12-04 DIAGNOSIS — Z853 Personal history of malignant neoplasm of breast: Secondary | ICD-10-CM | POA: Diagnosis not present

## 2022-12-04 DIAGNOSIS — I5042 Chronic combined systolic (congestive) and diastolic (congestive) heart failure: Secondary | ICD-10-CM | POA: Diagnosis not present

## 2022-12-04 DIAGNOSIS — I700 Atherosclerosis of aorta: Secondary | ICD-10-CM | POA: Diagnosis not present

## 2022-12-04 DIAGNOSIS — E785 Hyperlipidemia, unspecified: Secondary | ICD-10-CM | POA: Diagnosis not present

## 2022-12-04 DIAGNOSIS — E663 Overweight: Secondary | ICD-10-CM | POA: Diagnosis not present

## 2022-12-04 DIAGNOSIS — Z1339 Encounter for screening examination for other mental health and behavioral disorders: Secondary | ICD-10-CM | POA: Diagnosis not present

## 2022-12-04 DIAGNOSIS — Z23 Encounter for immunization: Secondary | ICD-10-CM | POA: Diagnosis not present

## 2022-12-04 DIAGNOSIS — E039 Hypothyroidism, unspecified: Secondary | ICD-10-CM | POA: Diagnosis not present

## 2022-12-04 DIAGNOSIS — Z0000 Encounter for general adult medical examination without abnormal findings: Secondary | ICD-10-CM | POA: Diagnosis not present

## 2022-12-04 DIAGNOSIS — R82998 Other abnormal findings in urine: Secondary | ICD-10-CM | POA: Diagnosis not present

## 2022-12-04 DIAGNOSIS — R2689 Other abnormalities of gait and mobility: Secondary | ICD-10-CM | POA: Diagnosis not present

## 2022-12-04 DIAGNOSIS — K219 Gastro-esophageal reflux disease without esophagitis: Secondary | ICD-10-CM | POA: Diagnosis not present

## 2022-12-04 DIAGNOSIS — I10 Essential (primary) hypertension: Secondary | ICD-10-CM | POA: Diagnosis not present

## 2022-12-04 DIAGNOSIS — Z1331 Encounter for screening for depression: Secondary | ICD-10-CM | POA: Diagnosis not present

## 2023-02-05 ENCOUNTER — Ambulatory Visit: Payer: Medicare Other | Attending: Cardiovascular Disease | Admitting: Cardiovascular Disease

## 2023-02-05 ENCOUNTER — Encounter: Payer: Self-pay | Admitting: Cardiovascular Disease

## 2023-02-05 VITALS — BP 139/70 | HR 62 | Ht 68.0 in | Wt 177.0 lb

## 2023-02-05 DIAGNOSIS — I10 Essential (primary) hypertension: Secondary | ICD-10-CM | POA: Diagnosis not present

## 2023-02-05 DIAGNOSIS — R4189 Other symptoms and signs involving cognitive functions and awareness: Secondary | ICD-10-CM

## 2023-02-05 DIAGNOSIS — E7800 Pure hypercholesterolemia, unspecified: Secondary | ICD-10-CM | POA: Diagnosis not present

## 2023-02-05 DIAGNOSIS — I77810 Thoracic aortic ectasia: Secondary | ICD-10-CM | POA: Diagnosis not present

## 2023-02-05 DIAGNOSIS — I25118 Atherosclerotic heart disease of native coronary artery with other forms of angina pectoris: Secondary | ICD-10-CM

## 2023-02-05 DIAGNOSIS — I5032 Chronic diastolic (congestive) heart failure: Secondary | ICD-10-CM | POA: Diagnosis not present

## 2023-02-05 NOTE — Progress Notes (Signed)
Cardiology Office Note    Date:  02/12/2023   ID:  Kathy Murphy, DOB [birth year], MRN [id number]  PCP:  Geoffry Paradise, MD  Cardiologist:   Thurmon Fair, MD   Chief Complaint  Patient presents with   Congestive Heart Failure          History of Present Illness:  Kathy Murphy is a 89 y.o. female returning in follow-up for CAD, roughly 7 years after placement of 2 overlapping drug-eluting stents in the subtotally occluded right coronary artery (Nov 2017, 2.5 x 38 mm Synergy) and chronic heart failure with mildly reduced ejection fraction (EF 50 to 55%).  She presented with non-STEMI in the setting of UTI in October 2022 but was unable to cooperate with cardiac catheterization.  She had another non-STEMI March 2023.  Additional medical problems include mild aortic root dilation (4.2 cm by echo, HTN, HLP, GERD, right breast cancer s/p lumpectomy.  She has not had any angina pectoris while taking combination long-acting nitrates and metoprolol succinate.  She becomes short of breath if she walks from one end of her home to the other, but this is not a new abnormality.  She denies orthopnea, PND and lower extremity edema.  In 2023 she had numerous episodes of syncope, but that she has had a better year.  She has not had any new syncopal events or falls this year.  Previous workup included negative carotid duplex ultrasound and her syncopal events seem to be largely due to orthostatic hypotension.  He has not had problems with palpitations.  She uses a walker.  Her memory has deteriorated further.  She has not had any chest pain or shortness of breath.  She has large varicose veins bilaterally, especially a large 1 on the left calf.  She has not had any ulcerations or bleeding.  At the time of the cardiac catheterization in 2016 (despite negative nuclear scan) showed subtotal occlusion of the RCA.  She also had high-grade stenoses in the diagonal artery, ramus intermedius branch, distal LAD  which were left for medical therapy.  The presence of widespread coronary lesions may explain the "false negative" perfusion pattern, but with depressed left ventricular systolic function (45%). Upper limit of normal LVEDP at cath.  Repeat echocardiogram in 2022 showed normal LV systolic function and wall motion, in March 2023 echo showed EF 50-55%.  Small ascending aortic aneurysm measured at 4.2 cm on echo.  Attempted repeat cardiac catheterization in October 2022 was unsuccessful due to her inability to cooperate.  In September 2018 she underwent lumpectomy for cancer of the right breast.  She did not receive radiation or chemotherapy.    She had right knee replacement in 2019.  She had a skin cancer removed from her left shoulder in December 2019.   Past Medical History:  Diagnosis Date   Arthritis    Breast cancer (HCC)    Right   CAD (coronary artery disease)    Cancer (HCC)    thyroid   Chronic combined systolic and diastolic CHF (congestive heart failure) (HCC)    Dilated aortic root (HCC)    First degree AV block    GERD (gastroesophageal reflux disease)    GI bleed 1980   AFTER POLYP EXCISION   Heart disease, hypertensive    Hypercholesterolemia    Hypertension    Hypothyroidism    Incontinence of urine    Myalgia    Myocardial infarction (HCC)    Obesity    Pain in  joints    Retinal detachment    Unsteady gait    UTI (urinary tract infection)    Vision loss of left eye    Vomiting     Past Surgical History:  Procedure Laterality Date   ABDOMINAL HYSTERECTOMY  1977   BREAST BIOPSY     BREAST LUMPECTOMY     x5   CARDIAC CATHETERIZATION N/A 02/08/2016   Procedure: Left Heart Cath and Coronary Angiography;  Surgeon: Lyn Records, MD;  Location: [facility] INVASIVE CV LAB;  Service: Cardiovascular;  Laterality: N/A;   CARDIAC CATHETERIZATION N/A 02/11/2016   Procedure: Coronary Stent Intervention;  Surgeon: Lyn Records, MD;  Location: [facility] INVASIVE CV LAB;  Service:  Cardiovascular;  Laterality: N/A;   CATARACT EXTRACTION, BILATERAL     CERVICAL LAMINECTOMY     DENTAL SURGERY  1952   EVACUATION BREAST HEMATOMA Right 01/09/2017   Procedure: EVACUATION RIGHT MASTECTOMY FLUID COLLECTION;  Surgeon: Emelia Loron, MD;  Location: [facility] OR;  Service: General;  Laterality: Right;   FOOT SURGERY     NECK SURGERY     THYROIDECTOMY  1957   TOTAL HIP ARTHROPLASTY  2012   left   TOTAL KNEE ARTHROPLASTY Right 05/18/2017   Procedure: RIGHT TOTAL KNEE ARTHROPLASTY;  Surgeon: Beverely Low, MD;  Location: [facility] OR;  Service: Orthopedics;  Laterality: Right;   TOTAL MASTECTOMY Right 11/27/2016   TOTAL MASTECTOMY Right 11/27/2016   Procedure: RIGHT TOTAL MASTECTOMY;  Surgeon: Emelia Loron, MD;  Location: [facility] OR;  Service: General;  Laterality: Right;    Current Medications: Outpatient Medications Prior to Visit  Medication Sig Dispense Refill   acetaminophen (TYLENOL) 650 MG CR tablet Take 650 mg by mouth every 8 (eight) hours as needed for pain.     aspirin EC 81 MG tablet Take 81 mg by mouth daily.     Carboxymethylcellulose Sodium (REFRESH TEARS OP) Apply 1-2 drops to eye daily.     Cholecalciferol (VITAMIN D3) 125 MCG (5000 UT) CAPS Take 1 capsule by mouth daily.     diazepam (VALIUM) 2 MG tablet Take 1 tablet (2 mg total) by mouth every 6 (six) hours as needed for anxiety. 2 tablet 0   Diclofenac Sodium CR 100 MG 24 hr tablet Take 100 mg by oral route.     Glycerin-Polysorbate 80 (REFRESH DRY EYE THERAPY OP)      isosorbide mononitrate (IMDUR) 30 MG 24 hr tablet TAKE 1 TABLET DAILY 90 tablet 3   levothyroxine (SYNTHROID, LEVOTHROID) 125 MCG tablet Take 125 mcg by mouth daily before breakfast.     metoprolol succinate (TOPROL-XL) 100 MG 24 hr tablet TAKE 1 TABLET DAILY 90 tablet 3   pantoprazole (PROTONIX) 40 MG tablet Take 40 mg by mouth daily.     rosuvastatin (CRESTOR) 10 MG tablet Take 10 mg by mouth every morning.     nitroGLYCERIN (NITROSTAT) 0.4 MG SL  tablet Place 1 tablet (0.4 mg total) under the tongue every 5 (five) minutes x 3 doses as needed for chest pain. (Patient not taking: Reported on 02/05/2023) 30 tablet 12   No facility-administered medications prior to visit.     Allergies:   Azithromycin, Other, Adhesive [tape], and Latex   Social History   Socioeconomic History   Marital status: Widowed    Spouse name: Not on file   Number of children: Not on file   Years of education: Not on file   Highest education level: Not on file  Occupational History   Not  on file  Tobacco Use   Smoking status: Never   Smokeless tobacco: Never  Vaping Use   Vaping status: Never Used  Substance and Sexual Activity   Alcohol use: No   Drug use: No   Sexual activity: Not on file  Other Topics Concern   Not on file  Social History Narrative   Widow   3 cars    Active with church and friends   Lived in home since 1960-03-08    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    Husband died in 03-08-2014   Social Determinants of Health   Financial Resource Strain: Low Risk  (06/28/2021)   Overall Financial Resource Strain (CARDIA)    Difficulty of Paying Living Expenses: Not hard at all  Food Insecurity: No Food Insecurity (07/28/2021)   Hunger Vital Sign    Worried About Running Out of Food in the Last Year: Never true    Ran Out of Food in the Last Year: Never true  Transportation Needs: No Transportation Needs (07/28/2021)   PRAPARE - Administrator, Civil Service (Medical): No    Lack of Transportation (Non-Medical): No  Physical Activity: Inactive (07/28/2021)   Exercise Vital Sign    Days of Exercise per Week: 0 days    Minutes of Exercise per Session: 0 min  Stress: No Stress Concern Present (06/28/2021)   [employer] of Occupational Health - Occupational Stress Questionnaire    Feeling of Stress : Only a little  Social Connections: Moderately Integrated (07/28/2021)   Social Connection and  Isolation Panel [NHANES]    Frequency of Communication with Friends and Family: Three times a week    Frequency of Social Gatherings with Friends and Family: Three times a week    Attends Religious Services: 1 to 4 times per year    Active Member of Clubs or Organizations: Yes    Attends Banker Meetings: 1 to 4 times per year    Marital Status: Widowed     Family History:  The patient's family history includes Heart disease in her mother.   ROS:   Please see the history of present illness.    ROS All other systems are reviewed and are negative.   PHYSICAL EXAM:   VS:  BP 139/70   Pulse 62   Ht 5\' 8"  (1.727 m)   Wt 177 lb (80.3 kg)   SpO2 96%   BMI 26.91 kg/m      General: Alert, oriented x3, no distress, appears younger than stated age Head: no evidence of trauma, PERRL, EOMI, no exophtalmos or lid lag, no myxedema, no xanthelasma; normal ears, nose and oropharynx Neck: normal jugular venous pulsations and no hepatojugular reflux; brisk carotid pulses without delay and no carotid bruits Chest: clear to auscultation, no signs of consolidation by percussion or palpation, normal fremitus, symmetrical and full respiratory excursions Cardiovascular: normal position and quality of the apical impulse, regular rhythm, normal first and second heart sounds, no murmurs, rubs or gallops Abdomen: no tenderness or distention, no masses by palpation, no abnormal pulsatility or arterial bruits, normal bowel sounds, no hepatosplenomegaly Extremities: No edema.  Large varicose veins especially in the left calf Neurological: grossly nonfocal Psych: Normal mood and affect    Wt Readings from Last 3 Encounters:  02/05/23 177 lb (80.3 kg)  10/19/22 171 lb 9.6 oz (77.8 kg)  08/10/22 172 lb 9.6 oz (78.3 kg)    Studies/Labs Reviewed:  Echo 09/11/2022   1. Left ventricular ejection fraction, by estimation, is 55 to 60%. The  left ventricle has normal function. The left ventricle  has no regional  wall motion abnormalities. Left ventricular diastolic parameters are  consistent with Grade I diastolic  dysfunction (impaired relaxation).   2. Right ventricular systolic function is normal. The right ventricular  size is normal. There is normal pulmonary artery systolic pressure.   3. Left atrial size was moderately dilated.   4. The mitral valve is abnormal. Mild mitral valve regurgitation. No  evidence of mitral stenosis.   5. Tricuspid valve regurgitation is moderate.   6. The aortic valve is tricuspid. Aortic valve regurgitation is mild.  Aortic valve sclerosis/calcification is present, without any evidence of  aortic stenosis.   7. Aortic dilatation noted. There is moderate dilatation of the aortic  root, measuring 42 mm. There is dilatation of the ascending aorta,  measuring 39 mm.   8. The inferior vena cava is normal in size with greater than 50%  respiratory variability, suggesting right atrial pressure of 3 mmHg.   EKG:   EKG Interpretation Date/Time:  Monday February 05 2023 13:30:28 EST Ventricular Rate:  62 PR Interval:  244 QRS Duration:  108 QT Interval:  422 QTC Calculation: 428 R Axis:   -30  Text Interpretation: Sinus rhythm with 1st degree A-V block Left axis deviation Left ventricular hypertrophy with repolarization abnormality ( R in aVL , Cornell product ) Cannot rule out Septal infarct , age undetermined When compared with ECG of 13-May-2021 06:44, Minimal criteria for Septal infarct are now Present T wave inversion no longer evident in Lateral leads Confirmed by Haillee Johann ([id number]) on 02/05/2023 1:59:12 PM       Lipid Panel     Component Value Date/Time   CHOL 82 12/13/2020 0536   CHOL 116 04/16/2017 1037   TRIG 31 12/13/2020 0536   HDL 40 (L) 12/13/2020 0536   HDL 45 04/16/2017 1037   CHOLHDL 2.1 12/13/2020 0536   VLDL 6 12/13/2020 0536   LDLCALC 36 12/13/2020 0536   LDLCALC 57 04/16/2017 1037    11/04/2019 Cholesterol  119, HDL 43, LDL 61, triglycerides 75 Potassium 3.9, normal liver function tests, TSH 0.38  05/19/2021 Cholesterol 111, HDL 40, LDL 57, triglycerides 68 Creatinine 0.99, potassium 3.9, hemoglobin A1c 5.1%, hemoglobin 12.5, TSH 0.14  11/28/2022 Cholesterol 128, HDL 49, LDL 64, triglycerides 73 Potassium 3.9, ALT 14, TSH 0.46  BMET    Component Value Date/Time   NA 135 05/13/2021 0058   NA 140 01/22/2017 0946   K 3.4 (L) 05/13/2021 0058   K 4.1 01/22/2017 0946   CL 102 05/13/2021 0058   CO2 25 05/13/2021 0058   CO2 29 01/22/2017 0946   GLUCOSE 102 (H) 05/13/2021 0058   GLUCOSE 101 01/22/2017 0946   BUN 24 (H) 05/13/2021 0058   BUN 17.9 01/22/2017 0946   CREATININE 0.99 05/13/2021 0058   CREATININE 0.8 01/22/2017 0946   CALCIUM 9.0 05/13/2021 0058   CALCIUM 9.1 01/22/2017 0946   GFRNONAA 54 (L) 05/13/2021 0058   GFRAA 57 (L) 01/01/2019 1241     ASSESSMENT:    1. Chronic diastolic heart failure (HCC)   2. Coronary artery disease of native artery of native heart with stable angina pectoris (HCC)   3. Hypercholesterolemia   4. Essential hypertension   5. Ascending aorta dilation (HCC)   6. Cognitive deficits        PLAN:  In order of  problems listed above:  CHF: Unchanged NYHA functional class II and clinically euvolemic without diuretics.  Normal LVEF in July 2024.   CAD: Asymptomatic on 2 antianginal medications as well as aspirin and statin.   Noninvasive studies unreliable for assessment with "false negative" nuclear stress test (possibly due to some degree of balanced defects?).  Not a good candidate for invasive procedures due to her inability to cooperate with worsening cognitive defects. HLP: All lipid parameters in desirable range.  Continue statin. HTN: Borderline elevated, but this is desirable since she has a tendency to develop orthostatic hypotension.  No changes made to medications.  Try to keep her blood pressure around 140/90 Asc Ao dilation:  Asymptomatic and mild. Serial studies not indicated as she is not a good candidate for prophylactic surgical repair.  Medication Adjustments/Labs and Tests Ordered: Current medicines are reviewed at length with the patient today.  Concerns regarding medicines are outlined above.  Medication changes, Labs and Tests ordered today are listed in the Patient Instructions below. Patient Instructions  Medication Instructions:  No changes *If you need a refill on your cardiac medications before your next appointment, please call your pharmacy*  Follow-Up: At [facility], you and your health needs are our priority.  As part of our continuing mission to provide you with exceptional heart care, we have created designated Provider Care Teams.  These Care Teams include your primary Cardiologist (physician) and Advanced Practice Providers (APPs -  Physician Assistants and Nurse Practitioners) who all work together to provide you with the care you need, when you need it.  We recommend signing up for the patient portal called "MyChart".  Sign up information is provided on this After Visit Summary.  MyChart is used to connect with patients for Virtual Visits (Telemedicine).  Patients are able to view lab/test results, encounter notes, upcoming appointments, etc.  Non-urgent messages can be sent to your provider as well.   To learn more about what you can do with MyChart, go to [email or web address].    Your next appointment:   6 months with Bernadene Person, NP  1 yr with Dr Royann Shivers     Signed, Thurmon Fair, MD  02/12/2023 5:20 PM    [facility] [address], [place], [place]  [address] Phone: [phone]; Fax: [phone]

## 2023-02-05 NOTE — Patient Instructions (Signed)
Medication Instructions:  No changes *If you need a refill on your cardiac medications before your next appointment, please call your pharmacy*  Follow-Up: At [facility], you and your health needs are our priority.  As part of our continuing mission to provide you with exceptional heart care, we have created designated Provider Care Teams.  These Care Teams include your primary Cardiologist (physician) and Advanced Practice Providers (APPs -  Physician Assistants and Nurse Practitioners) who all work together to provide you with the care you need, when you need it.  We recommend signing up for the patient portal called "MyChart".  Sign up information is provided on this After Visit Summary.  MyChart is used to connect with patients for Virtual Visits (Telemedicine).  Patients are able to view lab/test results, encounter notes, upcoming appointments, etc.  Non-urgent messages can be sent to your provider as well.   To learn more about what you can do with MyChart, go to [email or web address].    Your next appointment:   6 months with Bernadene Person, NP  1 yr with Dr Royann Shivers

## 2023-02-07 DIAGNOSIS — N390 Urinary tract infection, site not specified: Secondary | ICD-10-CM | POA: Diagnosis not present

## 2023-02-12 ENCOUNTER — Encounter: Payer: Self-pay | Admitting: Cardiovascular Disease

## 2023-02-12 DIAGNOSIS — L905 Scar conditions and fibrosis of skin: Secondary | ICD-10-CM | POA: Diagnosis not present

## 2023-02-12 DIAGNOSIS — D485 Neoplasm of uncertain behavior of skin: Secondary | ICD-10-CM | POA: Diagnosis not present

## 2023-02-12 DIAGNOSIS — Z85828 Personal history of other malignant neoplasm of skin: Secondary | ICD-10-CM | POA: Diagnosis not present

## 2023-02-12 DIAGNOSIS — L820 Inflamed seborrheic keratosis: Secondary | ICD-10-CM | POA: Diagnosis not present

## 2023-02-12 DIAGNOSIS — Z85820 Personal history of malignant melanoma of skin: Secondary | ICD-10-CM | POA: Diagnosis not present

## 2023-02-12 DIAGNOSIS — C44519 Basal cell carcinoma of skin of other part of trunk: Secondary | ICD-10-CM | POA: Diagnosis not present

## 2023-02-12 DIAGNOSIS — D2262 Melanocytic nevi of left upper limb, including shoulder: Secondary | ICD-10-CM | POA: Diagnosis not present

## 2023-02-12 DIAGNOSIS — D220 Melanocytic nevi of lip: Secondary | ICD-10-CM | POA: Diagnosis not present

## 2023-02-12 DIAGNOSIS — D2261 Melanocytic nevi of right upper limb, including shoulder: Secondary | ICD-10-CM | POA: Diagnosis not present

## 2023-02-12 DIAGNOSIS — L821 Other seborrheic keratosis: Secondary | ICD-10-CM | POA: Diagnosis not present

## 2023-03-30 ENCOUNTER — Emergency Department (HOSPITAL_BASED_OUTPATIENT_CLINIC_OR_DEPARTMENT_OTHER): Payer: Medicare Other

## 2023-03-30 ENCOUNTER — Encounter (HOSPITAL_BASED_OUTPATIENT_CLINIC_OR_DEPARTMENT_OTHER): Payer: Self-pay

## 2023-03-30 ENCOUNTER — Other Ambulatory Visit: Payer: Self-pay

## 2023-03-30 ENCOUNTER — Emergency Department (HOSPITAL_BASED_OUTPATIENT_CLINIC_OR_DEPARTMENT_OTHER)
Admission: EM | Admit: 2023-03-30 | Discharge: 2023-03-30 | Disposition: A | Discharge status: Home / Self Care | Payer: Medicare Other

## 2023-03-30 ENCOUNTER — Emergency Department (HOSPITAL_BASED_OUTPATIENT_CLINIC_OR_DEPARTMENT_OTHER): Payer: Medicare Other | Admitting: Radiology

## 2023-03-30 DIAGNOSIS — S0012XA Contusion of left eyelid and periocular area, initial encounter: Secondary | ICD-10-CM | POA: Diagnosis not present

## 2023-03-30 DIAGNOSIS — Z7982 Long term (current) use of aspirin: Secondary | ICD-10-CM | POA: Diagnosis not present

## 2023-03-30 DIAGNOSIS — S20212A Contusion of left front wall of thorax, initial encounter: Secondary | ICD-10-CM | POA: Diagnosis not present

## 2023-03-30 DIAGNOSIS — M419 Scoliosis, unspecified: Secondary | ICD-10-CM | POA: Diagnosis not present

## 2023-03-30 DIAGNOSIS — R0781 Pleurodynia: Secondary | ICD-10-CM | POA: Diagnosis not present

## 2023-03-30 DIAGNOSIS — Z91040 Latex allergy status: Secondary | ICD-10-CM | POA: Insufficient documentation

## 2023-03-30 DIAGNOSIS — S0512XA Contusion of eyeball and orbital tissues, left eye, initial encounter: Secondary | ICD-10-CM | POA: Diagnosis not present

## 2023-03-30 DIAGNOSIS — W010XXA Fall on same level from slipping, tripping and stumbling without subsequent striking against object, initial encounter: Secondary | ICD-10-CM | POA: Insufficient documentation

## 2023-03-30 DIAGNOSIS — I700 Atherosclerosis of aorta: Secondary | ICD-10-CM | POA: Diagnosis not present

## 2023-03-30 NOTE — ED Provider Notes (Signed)
[facility] EMERGENCY DEPARTMENT AT [facility] Provider Note   CSN: [id number] Arrival date & time: 03/30/23  1107     History  Chief Complaint  Patient presents with   Kathy Murphy    Kathy Murphy is a 89 y.o. female.  89 year old female presents emergency department today with left rib pain.  The patient states that she has had 2 falls in the past 3 days.  She states that she fell initially after losing her footing while trying to bring her groceries and then fell yesterday after slipping and losing her footing.  The patient fell on her left side.  She denies any abdominal pain, nausea, or vomiting.  She did hit the left side of her face a couple days ago.  She denies any significant headaches.  Denies any vision changes compared to her baseline.  She is not on any blood thinners.  She has been able to ambulate without any difficulty.   Fall       Home Medications Prior to Admission medications   Medication Sig Start Date End Date Taking? Authorizing Provider  acetaminophen (TYLENOL) 650 MG CR tablet Take 650 mg by mouth every 8 (eight) hours as needed for pain.    [provider]  aspirin EC 81 MG tablet Take 81 mg by mouth daily.    [provider]  Carboxymethylcellulose Sodium (REFRESH TEARS OP) Apply 1-2 drops to eye daily.    [provider]  Cholecalciferol (VITAMIN D3) 125 MCG (5000 UT) CAPS Take 1 capsule by mouth daily.    [provider]  diazepam (VALIUM) 2 MG tablet Take 1 tablet (2 mg total) by mouth every 6 (six) hours as needed for anxiety. 12/15/20   Kathlen Mody, MD  Diclofenac Sodium CR 100 MG 24 hr tablet Take 100 mg by oral route.    [provider]  Glycerin-Polysorbate 80 (REFRESH DRY EYE THERAPY OP)     [provider]  isosorbide mononitrate (IMDUR) 30 MG 24 hr tablet TAKE 1 TABLET DAILY 05/25/22   Croitoru, Mihai, MD  levothyroxine (SYNTHROID, LEVOTHROID) 125 MCG tablet Take 125 mcg by mouth daily  before breakfast.    [provider]  metoprolol succinate (TOPROL-XL) 100 MG 24 hr tablet TAKE 1 TABLET DAILY 04/06/22   Croitoru, Mihai, MD  nitroGLYCERIN (NITROSTAT) 0.4 MG SL tablet Place 1 tablet (0.4 mg total) under the tongue every 5 (five) minutes x 3 doses as needed for chest pain. Patient not taking: Reported on 02/05/2023 12/15/20   Kathlen Mody, MD  pantoprazole (PROTONIX) 40 MG tablet Take 40 mg by mouth daily.    [provider]  rosuvastatin (CRESTOR) 10 MG tablet Take 10 mg by mouth every morning.    [provider]      Allergies    Azithromycin, Other, Adhesive [tape], and Latex    Review of Systems   Review of Systems  Musculoskeletal:        Left-sided chest wall pain  All other systems reviewed and are negative.   Physical Exam Updated Vital Signs BP (!) 147/91 (BP Location: Left Arm)   Pulse 76   Temp 98.5 F (36.9 C)   Resp 17   SpO2 98%  Physical Exam Vitals and nursing note reviewed.   Gen: NAD Eyes: PERRL, EOMI, there is significant left periorbital ecchymosis and swelling noted HEENT: no oropharyngeal swelling Neck: trachea midline, no midline tenderness Resp: clear to auscultation bilaterally, tender over the lower ribs on the left  without any abdominal tenderness Card: RRR, no murmurs, rubs, or gallops Abd: nontender, nondistended Extremities: no calf tenderness, no edema Vascular: 2+ radial pulses bilaterally, 2+ DP pulses bilaterally Skin: no rashes Psyc: acting appropriately   ED Results / Procedures / Treatments   Labs (all labs ordered are listed, but only abnormal results are displayed) Labs Reviewed - No data to display  EKG None  Radiology DG Ribs Unilateral W/Chest Left Result Date: 03/30/2023 CLINICAL DATA:  Fall, left-sided rib pain EXAM: LEFT RIBS AND CHEST - 3+ VIEW COMPARISON:  05/11/2021 chest radiograph FINDINGS: No displaced fracture or other bone lesions are seen involving the ribs. Scoliosis.  No pneumothorax or pleural effusion. No focal pulmonary opacity. Normal cardiac and mediastinal contours. Aortic atherosclerosis. IMPRESSION: No displaced rib fracture. Please note that radiographs can be insensitive to nondisplaced rib fractures. Electronically Signed   By: Wiliam Ke M.D.   On: 03/30/2023 13:36   CT Maxillofacial Wo Contrast Result Date: 03/30/2023 CLINICAL DATA:  Left eye pain after multiple falls earlier this week EXAM: CT MAXILLOFACIAL WITHOUT CONTRAST TECHNIQUE: Multidetector CT imaging of the maxillofacial structures was performed. Multiplanar CT image reconstructions were also generated. RADIATION DOSE REDUCTION: This exam was performed according to the departmental dose-optimization program which includes automated exposure control, adjustment of the mA and/or kV according to patient size and/or use of iterative reconstruction technique. COMPARISON:  No prior CT maxillofacial available, correlation is made with 12/13/2020 CT head FINDINGS: Osseous: Advanced degenerative no fracture or mandibular dislocation. No destructive process. Changes in the bilateral temporomandibular joints, left greater than right. Orbits: No traumatic or inflammatory finding. Postsurgical changes in bilateral globes. Sinuses: Overall clear paranasal sinuses. The mastoids are well aerated. Soft tissues: Left periorbital hematoma, with additional edema in the left facial soft tissues. No evidence of foreign body. Limited intracranial: No significant or unexpected finding. IMPRESSION: 1. Left periorbital hematoma, with additional edema in the left facial soft tissues. No evidence of foreign body. 2. No acute facial bone fracture. Electronically Signed   By: Wiliam Ke M.D.   On: 03/30/2023 11:38    Procedures Procedures    Medications Ordered in ED Medications - No data to display  ED Course/ Medical Decision Making/ A&P                                 Medical Decision Making 89 year old female  presenting to the emergency department today after a fall with left-sided chest wall discomfort and left periorbital ecchymosis.  I will further evaluate the patient here with an x-ray to evaluate for refracture or pneumothorax.  Will obtain a CT scan of her head to eval for intracranial hemorrhage or significant injury.  The patient does not have any significant abdominal tenderness on exam so suspicion for intra-abdominal injury is low at this time.  I will reevaluate for ultimate disposition.  The patient's x-ray and CT scan are unremarkable.  She is discharged with return precautions.  Amount and/or Complexity of Data Reviewed Radiology: ordered.           Final Clinical Impression(s) / ED Diagnoses Final diagnoses:  Periorbital ecchymosis of left eye, initial encounter  Contusion of left chest wall, initial encounter    Rx / DC Orders ED Discharge Orders     None         Durwin Glaze, MD 03/30/23 1345

## 2023-03-30 NOTE — Discharge Instructions (Signed)
Your CT scan and x-ray today were reassuring.  Please take Tylenol for pain over the next few days.  Follow-up with your doctor.  Return to the ER for worsening symptoms.

## 2023-03-30 NOTE — ED Triage Notes (Signed)
Pt c/o L eye pain post mechanical fall x2 Wednesday & Thursday. Pt also c/o L sided rib pain. Pt denies blood thinners, A&O4 at time of triage.

## 2023-04-02 ENCOUNTER — Other Ambulatory Visit: Payer: Self-pay | Admitting: Cardiovascular Disease

## 2023-04-10 DIAGNOSIS — S20212D Contusion of left front wall of thorax, subsequent encounter: Secondary | ICD-10-CM | POA: Diagnosis not present

## 2023-04-10 DIAGNOSIS — I2510 Atherosclerotic heart disease of native coronary artery without angina pectoris: Secondary | ICD-10-CM | POA: Diagnosis not present

## 2023-04-10 DIAGNOSIS — R296 Repeated falls: Secondary | ICD-10-CM | POA: Diagnosis not present

## 2023-04-10 DIAGNOSIS — I10 Essential (primary) hypertension: Secondary | ICD-10-CM | POA: Diagnosis not present

## 2023-04-10 DIAGNOSIS — M79671 Pain in right foot: Secondary | ICD-10-CM | POA: Diagnosis not present

## 2023-04-10 DIAGNOSIS — R2689 Other abnormalities of gait and mobility: Secondary | ICD-10-CM | POA: Diagnosis not present

## 2023-04-10 DIAGNOSIS — S0012XD Contusion of left eyelid and periocular area, subsequent encounter: Secondary | ICD-10-CM | POA: Diagnosis not present

## 2023-04-23 DIAGNOSIS — R296 Repeated falls: Secondary | ICD-10-CM | POA: Diagnosis not present

## 2023-04-23 DIAGNOSIS — I10 Essential (primary) hypertension: Secondary | ICD-10-CM | POA: Diagnosis not present

## 2023-04-23 DIAGNOSIS — S20212D Contusion of left front wall of thorax, subsequent encounter: Secondary | ICD-10-CM | POA: Diagnosis not present

## 2023-04-23 DIAGNOSIS — R2689 Other abnormalities of gait and mobility: Secondary | ICD-10-CM | POA: Diagnosis not present

## 2023-04-23 DIAGNOSIS — T148XXA Other injury of unspecified body region, initial encounter: Secondary | ICD-10-CM | POA: Diagnosis not present

## 2023-04-23 DIAGNOSIS — I2510 Atherosclerotic heart disease of native coronary artery without angina pectoris: Secondary | ICD-10-CM | POA: Diagnosis not present

## 2023-04-23 DIAGNOSIS — M79671 Pain in right foot: Secondary | ICD-10-CM | POA: Diagnosis not present

## 2023-04-23 DIAGNOSIS — S0012XD Contusion of left eyelid and periocular area, subsequent encounter: Secondary | ICD-10-CM | POA: Diagnosis not present

## 2023-04-24 ENCOUNTER — Encounter (HOSPITAL_COMMUNITY): Payer: Self-pay

## 2023-04-24 ENCOUNTER — Inpatient Hospital Stay (HOSPITAL_COMMUNITY): Admission: RE | Admit: 2023-04-24 | Payer: Medicare Other | Source: Ambulatory Visit

## 2023-04-24 ENCOUNTER — Other Ambulatory Visit (HOSPITAL_COMMUNITY): Payer: Self-pay | Admitting: Nurse Practitioner

## 2023-04-24 ENCOUNTER — Ambulatory Visit (HOSPITAL_COMMUNITY)
Admission: RE | Admit: 2023-04-24 | Discharge: 2023-04-24 | Disposition: A | Discharge status: Home / Self Care | Payer: Medicare Other | Source: Ambulatory Visit | Attending: Cardiovascular Disease | Admitting: Cardiovascular Disease

## 2023-04-24 DIAGNOSIS — R600 Localized edema: Secondary | ICD-10-CM | POA: Diagnosis present

## 2023-05-30 ENCOUNTER — Other Ambulatory Visit: Payer: Self-pay | Admitting: Cardiovascular Disease

## 2023-06-04 DIAGNOSIS — I110 Hypertensive heart disease with heart failure: Secondary | ICD-10-CM | POA: Diagnosis not present

## 2023-06-04 DIAGNOSIS — I2510 Atherosclerotic heart disease of native coronary artery without angina pectoris: Secondary | ICD-10-CM | POA: Diagnosis not present

## 2023-06-04 DIAGNOSIS — R296 Repeated falls: Secondary | ICD-10-CM | POA: Diagnosis not present

## 2023-06-04 DIAGNOSIS — I700 Atherosclerosis of aorta: Secondary | ICD-10-CM | POA: Diagnosis not present

## 2023-06-04 DIAGNOSIS — E785 Hyperlipidemia, unspecified: Secondary | ICD-10-CM | POA: Diagnosis not present

## 2023-06-04 DIAGNOSIS — R2681 Unsteadiness on feet: Secondary | ICD-10-CM | POA: Diagnosis not present

## 2023-07-27 DIAGNOSIS — Z961 Presence of intraocular lens: Secondary | ICD-10-CM | POA: Diagnosis not present

## 2023-07-27 DIAGNOSIS — H524 Presbyopia: Secondary | ICD-10-CM | POA: Diagnosis not present

## 2023-07-27 DIAGNOSIS — H04123 Dry eye syndrome of bilateral lacrimal glands: Secondary | ICD-10-CM | POA: Diagnosis not present

## 2023-08-13 DIAGNOSIS — I110 Hypertensive heart disease with heart failure: Secondary | ICD-10-CM | POA: Diagnosis not present

## 2023-08-13 DIAGNOSIS — M6281 Muscle weakness (generalized): Secondary | ICD-10-CM | POA: Diagnosis not present

## 2023-08-13 DIAGNOSIS — C50411 Malignant neoplasm of upper-outer quadrant of right female breast: Secondary | ICD-10-CM | POA: Diagnosis not present

## 2023-08-13 DIAGNOSIS — I2510 Atherosclerotic heart disease of native coronary artery without angina pectoris: Secondary | ICD-10-CM | POA: Diagnosis not present

## 2023-08-13 DIAGNOSIS — R269 Unspecified abnormalities of gait and mobility: Secondary | ICD-10-CM | POA: Diagnosis not present

## 2023-08-13 DIAGNOSIS — E785 Hyperlipidemia, unspecified: Secondary | ICD-10-CM | POA: Diagnosis not present

## 2023-08-13 DIAGNOSIS — Z170 Estrogen receptor positive status [ER+]: Secondary | ICD-10-CM | POA: Diagnosis not present

## 2023-08-13 DIAGNOSIS — R440 Auditory hallucinations: Secondary | ICD-10-CM | POA: Diagnosis not present

## 2023-08-13 DIAGNOSIS — I214 Non-ST elevation (NSTEMI) myocardial infarction: Secondary | ICD-10-CM | POA: Diagnosis not present

## 2023-08-13 DIAGNOSIS — R296 Repeated falls: Secondary | ICD-10-CM | POA: Diagnosis not present

## 2023-08-13 DIAGNOSIS — R4189 Other symptoms and signs involving cognitive functions and awareness: Secondary | ICD-10-CM | POA: Diagnosis not present

## 2023-08-13 DIAGNOSIS — M159 Polyosteoarthritis, unspecified: Secondary | ICD-10-CM | POA: Diagnosis not present

## 2023-08-13 DIAGNOSIS — I5042 Chronic combined systolic (congestive) and diastolic (congestive) heart failure: Secondary | ICD-10-CM | POA: Diagnosis not present

## 2023-08-14 ENCOUNTER — Encounter: Payer: Self-pay | Admitting: Nurse Practitioner

## 2023-08-14 ENCOUNTER — Ambulatory Visit: Attending: Nurse Practitioner | Admitting: Nurse Practitioner

## 2023-08-14 VITALS — BP 124/60 | HR 61 | Ht 68.0 in | Wt 170.2 lb

## 2023-08-14 DIAGNOSIS — E7800 Pure hypercholesterolemia, unspecified: Secondary | ICD-10-CM | POA: Diagnosis not present

## 2023-08-14 DIAGNOSIS — I77810 Thoracic aortic ectasia: Secondary | ICD-10-CM | POA: Diagnosis not present

## 2023-08-14 DIAGNOSIS — I2510 Atherosclerotic heart disease of native coronary artery without angina pectoris: Secondary | ICD-10-CM | POA: Insufficient documentation

## 2023-08-14 DIAGNOSIS — R4189 Other symptoms and signs involving cognitive functions and awareness: Secondary | ICD-10-CM | POA: Diagnosis not present

## 2023-08-14 DIAGNOSIS — I5032 Chronic diastolic (congestive) heart failure: Secondary | ICD-10-CM | POA: Insufficient documentation

## 2023-08-14 DIAGNOSIS — I10 Essential (primary) hypertension: Secondary | ICD-10-CM | POA: Insufficient documentation

## 2023-08-14 MED ORDER — NITROGLYCERIN 0.4 MG SL SUBL: 0.4000 mg | SUBLINGUAL_TABLET | SUBLINGUAL | 5 refills | Status: AC | PRN

## 2023-08-14 MED ORDER — NITROGLYCERIN 0.4 MG SL SUBL: 0.4000 mg | SUBLINGUAL_TABLET | SUBLINGUAL | 12 refills | Status: DC | PRN

## 2023-08-14 NOTE — Progress Notes (Signed)
 Office Visit    Patient Name: Kathy Murphy Date of Encounter: 08/14/2023  Primary Care Provider:  Suan Elm, MD Primary Cardiologist:  Luana Rumple, MD  Chief Complaint    89 year old female with a history of CAD s/p PCI, DES x 2 overlapping-RCA in 2017, chronic heart failure with mildly reduced EF, mild dilation of aortic root, chronic dizziness, syncope, hypertension, hyperlipidemia, GERD, and breast cancer who presents for follow-up related to CAD and heart failure.  Past Medical History    Past Medical History:  Diagnosis Date   Arthritis    Breast cancer (HCC)    Right   CAD (coronary artery disease)    Cancer (HCC)    thyroid    Chronic combined systolic and diastolic CHF (congestive heart failure) (HCC)    Dilated aortic root (HCC)    First degree AV block    GERD (gastroesophageal reflux disease)    GI bleed 1980   AFTER POLYP EXCISION   Heart disease, hypertensive    Hypercholesterolemia    Hypertension    Hypothyroidism    Incontinence of urine    Myalgia    Myocardial infarction (HCC)    Obesity    Pain in joints    Retinal detachment    Unsteady gait    UTI (urinary tract infection)    Vision loss of left eye    Vomiting    Past Surgical History:  Procedure Laterality Date   ABDOMINAL HYSTERECTOMY  1977   BREAST BIOPSY     BREAST LUMPECTOMY     x5   CARDIAC CATHETERIZATION N/A 02/08/2016   Procedure: Left Heart Cath and Coronary Angiography;  Surgeon: Arty Binning, MD;  Location: [facility] INVASIVE CV LAB;  Service: Cardiovascular;  Laterality: N/A;   CARDIAC CATHETERIZATION N/A 02/11/2016   Procedure: Coronary Stent Intervention;  Surgeon: Arty Binning, MD;  Location: [facility] INVASIVE CV LAB;  Service: Cardiovascular;  Laterality: N/A;   CATARACT EXTRACTION, BILATERAL     CERVICAL LAMINECTOMY     DENTAL SURGERY  1952   EVACUATION BREAST HEMATOMA Right 01/09/2017   Procedure: EVACUATION RIGHT MASTECTOMY FLUID COLLECTION;  Surgeon: Enid Harry, MD;  Location: [facility] OR;  Service: General;  Laterality: Right;   FOOT SURGERY     NECK SURGERY     THYROIDECTOMY  1957   TOTAL HIP ARTHROPLASTY  2012   left   TOTAL KNEE ARTHROPLASTY Right 05/18/2017   Procedure: RIGHT TOTAL KNEE ARTHROPLASTY;  Surgeon: Winston Hawking, MD;  Location: [facility] OR;  Service: Orthopedics;  Laterality: Right;   TOTAL MASTECTOMY Right 11/27/2016   TOTAL MASTECTOMY Right 11/27/2016   Procedure: RIGHT TOTAL MASTECTOMY;  Surgeon: Enid Harry, MD;  Location: [facility] OR;  Service: General;  Laterality: Right;    Allergies  Allergies  Allergen Reactions   Azithromycin     Other reaction(s): Unknown Other reaction(s): Unknown   Other Other (See Comments)    SOME OF THE "MYCINS"...UNKNOWN REACTION.   Adhesive [Tape] Rash   Latex Rash     Labs/Other Studies Reviewed    The following studies were reviewed today:  Cardiac Studies & Procedures   ______________________________________________________________________________________________ CARDIAC CATHETERIZATION  CARDIAC CATHETERIZATION 02/11/2016  Conclusion  Mid RCA chronic total occlusion collateralized from the left coronary artery.  Successful overlapping stents from the distal RCA no the margin of the PDA back to the proximal RCA using two 38 mm long Synergy drug-eluting stents postdilated to 3.0 mm in diameter with 100% stenosis reduced to 0%. Grade  3 flow was noted.  Angio-Seal was used for successful hemostasis.  RECOMMENDATIONS:   Discharge in a.m. on aspirin  and Plavix .  Ambulate later this evening  Findings Coronary Findings Diagnostic  Dominance: Right  Left Anterior Descending  First Diagonal Branch Vessel is small in size.  Second Diagonal Branch Vessel is small in size.  Third Diagonal Branch Vessel is small in size.  Ramus Intermedius  Left Circumflex  First Obtuse Marginal Branch Vessel is small in size.  Second Obtuse Marginal Branch Vessel is small in  size.  Right Coronary Artery Culprit lesion. The lesion is eccentric with left-to-right collateral flow. The lesion was not previously treated.  Right Posterior Descending Artery  Right Posterior Atrioventricular Artery Vessel is small in size.  Second Right Posterolateral Branch Vessel is small in size.  Third Right Posterolateral Branch Vessel is small in size.  Intervention  Mid RCA lesion Angioplasty Lesion length: 20 mm. Lesion crossed with guidewire using a WIRE FIGHTER CROSSING. Pre-stent angioplasty was performed using a BALLOON [place] MOZEC 2.5X13. A STENT SYNERGY DES [id number] drug eluting stent was successfully placed, and overlaps previously placed stent. Stent strut is well apposed. Post-stent angioplasty was performed using a BALLOON [place] EUPHORA RX3.0X27. There is no pre-interventional antegrade distal flow (TIMI 0).  The post-interventional distal flow is normal (TIMI 3). The intervention was successful . No complications occurred at this lesion. Pressure wire/FFR was not performed on the lesion . IVUS was not performed on the lesion . There is a 0% residual stenosis post intervention.  Dist RCA lesion Angioplasty Lesion crossed with guidewire using a WIRE FIGHTER CROSSING. Pre-stent angioplasty was performed using a BALLOON [place] MOZEC 2.5X13. A STENT SYNERGY DES 2.5X38 drug eluting stent was successfully placed, and does not overlap previously placed stent. Stent strut is well apposed. Post-stent angioplasty was performed using a BALLOON [place] EUPHORA RX3.0X27. Maximum pressure: 14 atm. There is no pre-interventional antegrade distal flow (TIMI 0).  The post-interventional distal flow is normal (TIMI 3). The intervention was successful . No complications occurred at this lesion. Pressure wire/FFR was not performed on the lesion . IVUS was not performed on the lesion . There is a 0% residual stenosis post intervention.   CARDIAC CATHETERIZATION  CARDIAC CATHETERIZATION  02/08/2016  Conclusion  Prox LAD to Mid LAD lesion, 50 %stenosed.   Significant coronary artery disease with subtotally occluded mid right coronary collateralized from the left coronary artery (LAD and circumflex).  Severe ostial obstruction in the second diagonal, 75% stenosis in the first diagonal, 70% stenosis within a region of tortuosity in the ramus intermedius, and 50% narrowing in the proximal to mid LAD. The distal LAD is diffusely diseased without focal obstruction.  Low normal LV function with EF estimated to be 50%.  Failed PTCA of the mid right coronary due to poor guide catheter support.  RECOMMENDATION:  Begin dual antiplatelet therapy.  Re-attempt PCI of the right coronary from the femoral approach on 02/11/2016. Will discuss with CTO team as well.  Intensify anti-ischemic therapy.  Findings Coronary Findings Diagnostic  Dominance: Right  Left Anterior Descending  First Diagonal Branch Vessel is small in size.  Second Diagonal Branch Vessel is small in size.  Third Diagonal Branch Vessel is small in size.  Ramus Intermedius  Left Circumflex  First Obtuse Marginal Branch Vessel is small in size.  Second Obtuse Marginal Branch Vessel is small in size.  Right Coronary Artery Culprit lesion. The lesion is eccentric with left-to-right collateral flow. The lesion was  not previously treated.  Right Posterior Descending Artery  Right Posterior Atrioventricular Artery Vessel is small in size.  Second Right Posterolateral Branch Vessel is small in size.  Third Right Posterolateral Branch Vessel is small in size.  Intervention  No interventions have been documented.   STRESS TESTS  MYOCARDIAL PERFUSION IMAGING 12/30/2015  Narrative  The left ventricular ejection fraction is moderately decreased (30-44%).  Nuclear stress EF: 43%. The LV is moderately dilated.  There was no ST segment deviation noted during stress.  This is a low risk  study. No ischemia and no evidence of infarctoin .   ECHOCARDIOGRAM  ECHOCARDIOGRAM COMPLETE 09/11/2022  Narrative ECHOCARDIOGRAM REPORT    Patient Name:   Kathy Murphy [facility] Date of Exam: 09/11/2022 Medical Rec #:  [id number]      Height:       66.0 in Accession #:    [id number]     Weight:       172.6 lb Date of Birth:  1927-12-24      BSA:          1.878 m Patient Age:    89 years       BP:           113/67 mmHg Patient Gender: F              HR:           64 bpm. Exam Location:  [address]  Procedure: 2D Echo, Cardiac Doppler, Color Doppler and 3D Echo  Indications:    Syncope R55  History:        Patient has prior history of Echocardiogram examinations, most recent 05/12/2021. CHF, CAD and Previous Myocardial Infarction; Risk Factors:Hypertension.  Sonographer:    Joleen Navy RDCS Referring Phys: [phone] Ronne Stefanski C Mychael Smock  IMPRESSIONS   1. Left ventricular ejection fraction, by estimation, is 55 to 60%. The left ventricle has normal function. The left ventricle has no regional wall motion abnormalities. Left ventricular diastolic parameters are consistent with Grade I diastolic dysfunction (impaired relaxation). 2. Right ventricular systolic function is normal. The right ventricular size is normal. There is normal pulmonary artery systolic pressure. 3. Left atrial size was moderately dilated. 4. The mitral valve is abnormal. Mild mitral valve regurgitation. No evidence of mitral stenosis. 5. Tricuspid valve regurgitation is moderate. 6. The aortic valve is tricuspid. Aortic valve regurgitation is mild. Aortic valve sclerosis/calcification is present, without any evidence of aortic stenosis. 7. Aortic dilatation noted. There is moderate dilatation of the aortic root, measuring 42 mm. There is dilatation of the ascending aorta, measuring 39 mm. 8. The inferior vena cava is normal in size with greater than 50% respiratory variability, suggesting right atrial pressure of 3  mmHg.  FINDINGS Left Ventricle: Left ventricular ejection fraction, by estimation, is 55 to 60%. The left ventricle has normal function. The left ventricle has no regional wall motion abnormalities. The left ventricular internal cavity size was normal in size. There is no left ventricular hypertrophy. Left ventricular diastolic parameters are consistent with Grade I diastolic dysfunction (impaired relaxation).  Right Ventricle: The right ventricular size is normal. No increase in right ventricular wall thickness. Right ventricular systolic function is normal. There is normal pulmonary artery systolic pressure. The tricuspid regurgitant velocity is 2.50 m/s, and with an assumed right atrial pressure of 8 mmHg, the estimated right ventricular systolic pressure is 33.0 mmHg.  Left Atrium: Left atrial size was moderately dilated.  Right Atrium: Right atrial size was normal in size.  Pericardium: There is no evidence of pericardial effusion.  Mitral Valve: The mitral valve is abnormal. There is mild thickening of the mitral valve leaflet(s). Mild mitral valve regurgitation. No evidence of mitral valve stenosis.  Tricuspid Valve: The tricuspid valve is normal in structure. Tricuspid valve regurgitation is moderate . No evidence of tricuspid stenosis.  Aortic Valve: The aortic valve is tricuspid. Aortic valve regurgitation is mild. Aortic valve sclerosis/calcification is present, without any evidence of aortic stenosis.  Pulmonic Valve: The pulmonic valve was normal in structure. Pulmonic valve regurgitation is mild. No evidence of pulmonic stenosis.  Aorta: Aortic dilatation noted. There is moderate dilatation of the aortic root, measuring 42 mm. There is dilatation of the ascending aorta, measuring 39 mm.  Venous: The inferior vena cava is normal in size with greater than 50% respiratory variability, suggesting right atrial pressure of 3 mmHg.  IAS/Shunts: No atrial level shunt detected by  color flow Doppler.   LEFT VENTRICLE PLAX 2D LVIDd:         5.20 cm   Diastology LVIDs:         3.80 cm   LV e' medial:    3.00 cm/s LV PW:         1.10 cm   LV E/e' medial:  12.4 LV IVS:        1.10 cm   LV e' lateral:   5.11 cm/s LVOT diam:     2.30 cm   LV E/e' lateral: 7.3 LV SV:         64 LV SV Index:   34 LVOT Area:     4.15 cm  3D Volume EF: 3D EF:        61 % LV EDV:       196 ml LV ESV:       76 ml LV SV:        121 ml  RIGHT VENTRICLE RV Basal diam:  3.10 cm RV Mid diam:    2.80 cm RV S prime:     11.20 cm/s TAPSE (M-mode): 2.1 cm  LEFT ATRIUM             Index        RIGHT ATRIUM           Index LA diam:        4.40 cm 2.34 cm/m   RA Area:     16.00 cm LA Vol (A2C):   70.1 ml 37.32 ml/m  RA Volume:   37.90 ml  20.18 ml/m LA Vol (A4C):   44.5 ml 23.69 ml/m LA Biplane Vol: 57.2 ml 30.45 ml/m AORTIC VALVE LVOT Vmax:   64.40 cm/s LVOT Vmean:  42.000 cm/s LVOT VTI:    0.155 m  AORTA Ao Root diam: 4.20 cm Ao Asc diam:  3.90 cm  MITRAL VALVE                TRICUSPID VALVE MV Area (PHT): 2.83 cm     TR Peak grad:   25.0 mmHg MV Decel Time: 268 msec     TR Vmax:        250.00 cm/s MV E velocity: 37.20 cm/s MV A velocity: 104.00 cm/s  SHUNTS MV E/A ratio:  0.36         Systemic VTI:  0.16 m Systemic Diam: 2.30 cm  Janelle Mediate MD Electronically signed by Janelle Mediate MD Signature Date/Time: 09/11/2022/2:58:25 PM    Final    MONITORS  CARDIAC EVENT MONITOR 09/20/2022  Narrative   The dominant rhythm is normal sinus rhythm with normal circadian variation   There are rare premature ventricular contractions.  There is a single 7 beat run of nonsustained ventricular tachycardia.   There is no significant tachyarrhythmia.  Specifically there is no atrial fibrillation.   There is no evidence of severe bradycardia, long pauses or high-grade atrioventricular block.  Mildly abnormal arrhythmia monitor.  There are rare PVCs and a single 7 beat run of  nonsustained ventricular tachycardia.       ______________________________________________________________________________________________     Recent Labs: No results found for requested labs within last 365 days.  Recent Lipid Panel    Component Value Date/Time   CHOL 82 12/13/2020 0536   CHOL 116 04/16/2017 1037   TRIG 31 12/13/2020 0536   HDL 40 (L) 12/13/2020 0536   HDL 45 04/16/2017 1037   CHOLHDL 2.1 12/13/2020 0536   VLDL 6 12/13/2020 0536   LDLCALC 36 12/13/2020 0536   LDLCALC 57 04/16/2017 1037    History of Present Illness    89 year old female with the above past medical history including CAD s/p PCI, DES x 2 overlapping-RCA in 2017, chronic heart failure with mildly reduced EF, mild dilation of aortic root, chronic dizziness, syncope, hypertension, hyperlipidemia, GERD, and breast cancer.   Cardiac catheterization in 2017 revealed severe multivessel disease with subtotally occluded mid RCA followed by 2 to grade stenoses in the distal RCA with collaterals from LAD and left circumflex, 95% stenosis of ostial second diagonal, 75% stenosis of proximal first diagonal, 70% stenosis of the RI, 50% stenosis of the proximal to mid LAD followed by 70% stenosis of the distal LAD.  PCI was attempted at that time but PTCA failed due to for guide catheter support.  She returned to the Cath Lab and 02/2016 underwent successful PCI with DES x 2 overlapping-RCA.  Echocardiogram 01/31/2016 showed EF 40 to 45%, moderate diffuse hypokinesis, G1 DD, trivial AI.  She was hospitalized in 12/2020 in the setting of NSTEMI, UTI associated altered mental status.  Echocardiogram at that time showed EF 55 to 60%, normal wall motion. Cardiac catheterization was attempted, however, patient was not cooperative and procedure was aborted. She was hospitalized in 05/2021 in the setting of NSTEMI.  Repeat echocardiogram showed EF 50 to 55%, moderate hypokinesis of the basal inferior septal wall, and inferior  walls, G1 DD.  Given prior cath findings, advanced age, the decision was made to treat medically.  She had a syncopal episode in 07/2022.  MRI of the brain was negative for acute process.  Repeat echocardiogram in 09/2022 showed EF 55 to 60%, normally complete, no RWMA, G1 DD, normal RV, mild mitral valve regurgitation, mild aortic valve regurgitation, moderate dilation of the aortic root measuring 42 mm, mild dilation of the ascending aorta measuring 39 mm.  Carotid ultrasound showed 1 to 39% B ICA stenosis. 30-day event monitor showed predominantly sinus rhythm, one 7 beat run of NSVT, rare PVCs. She was last seen in the office on 02/05/2023 and was stable from a cardiac standpoint.  She denied symptoms concerning for angina.  BP was borderline elevated.  Escalation of antihypertensive regimen was deferred in the setting of orthostatic hypotension.  He was evaluated in the ED in January 2025 after a mechanical fall.  Imaging was unremarkable for acute injury to the left periorbital hematoma with evidence of edema in the left facial soft tissues.  She was discharged home in stable condition.  She presents today for follow-up  accompanied by her friend.  Since her last visit she has been stable from a cardiac standpoint.  She has dependent nonpitting bilateral lower extremity edema, improved with elevation.  She has mild shortness of breath with exertion, unchanged from prior visits, she denies any other symptoms concerning for angina.  BP has been stable. Recent falls have occurred in the setting of loss of balance.  She denies dizziness, presyncope, syncope. She does wear a life alert.    Home Medications    Current Outpatient Medications  Medication Sig Dispense Refill   acetaminophen  (TYLENOL ) 650 MG CR tablet Take 650 mg by mouth every 8 (eight) hours as needed for pain.     aspirin  EC 81 MG tablet Take 81 mg by mouth daily.     Carboxymethylcellulose Sodium (REFRESH TEARS OP) Apply 1-2 drops to eye  daily.     Cholecalciferol  (VITAMIN D3) 125 MCG (5000 UT) CAPS Take 1 capsule by mouth daily.     diazepam  (VALIUM ) 2 MG tablet Take 1 tablet (2 mg total) by mouth every 6 (six) hours as needed for anxiety. 2 tablet 0   diclofenac  (CATAFLAM) 50 MG tablet Take 50 mg by mouth every morning. Taking 100 mg daily     Diclofenac  Sodium CR 100 MG 24 hr tablet Take 100 mg by oral route.     Glycerin-Polysorbate 80 (REFRESH DRY EYE THERAPY OP)      isosorbide  mononitrate (IMDUR ) 30 MG 24 hr tablet Take 1 tablet (30 mg total) by mouth daily. 90 tablet 2   levothyroxine  (SYNTHROID , LEVOTHROID) 125 MCG tablet Take 125 mcg by mouth daily before breakfast.     metoprolol  succinate (TOPROL -XL) 100 MG 24 hr tablet TAKE 1 TABLET DAILY 90 tablet 3   pantoprazole  (PROTONIX ) 40 MG tablet Take 40 mg by mouth daily.     rosuvastatin  (CRESTOR ) 10 MG tablet Take 10 mg by mouth every morning.     nitroGLYCERIN  (NITROSTAT ) 0.4 MG SL tablet Place 1 tablet (0.4 mg total) under the tongue every 5 (five) minutes x 3 doses as needed for chest pain. 30 tablet 5   No current facility-administered medications for this visit.     Review of Systems    She denies chest pain, palpitations, pnd, orthopnea, n, v, dizziness, syncope, weight gain, or early satiety. All other systems reviewed and are otherwise negative except as noted above.   Physical Exam    VS:  BP 124/60 (BP Location: Left Arm, Patient Position: Sitting, Cuff Size: Normal)   Pulse 61   Ht 5\' 8"  (1.727 m)   Wt 170 lb 3.2 oz (77.2 kg)   SpO2 96%   BMI 25.88 kg/m  GEN: Well nourished, well developed, in no acute distress. HEENT: normal. Neck: Supple, no JVD, carotid bruits, or masses. Cardiac: RRR, no murmurs, rubs, or gallops. No clubbing, cyanosis, trace nonpitting bilateral lower extremity edema.  Radials/DP/PT 2+ and equal bilaterally.  Respiratory:  Respirations regular and unlabored, clear to auscultation bilaterally. GI: Soft, nontender,  nondistended, BS + x 4. MS: no deformity or atrophy. Skin: warm and dry, no rash. Neuro:  Strength and sensation are intact. Psych: Normal affect.  Accessory Clinical Findings    ECG personally reviewed by me today -    - no EKG in office today.   Lab Results  Component Value Date   WBC 5.9 05/13/2021   HGB 12.5 05/13/2021   HCT 39.0 05/13/2021   MCV 94.7 05/13/2021   PLT 127 (L) 05/13/2021  Lab Results  Component Value Date   CREATININE 0.99 05/13/2021   BUN 24 (H) 05/13/2021   NA 135 05/13/2021   K 3.4 (L) 05/13/2021   CL 102 05/13/2021   CO2 25 05/13/2021   Lab Results  Component Value Date   ALT 11 12/11/2020   AST 15 12/11/2020   ALKPHOS 83 12/11/2020   BILITOT 0.9 12/11/2020   Lab Results  Component Value Date   CHOL 82 12/13/2020   HDL 40 (L) 12/13/2020   LDLCALC 36 12/13/2020   TRIG 31 12/13/2020   CHOLHDL 2.1 12/13/2020    Lab Results  Component Value Date   HGBA1C 5.1 05/12/2021    Assessment & Plan    1. CAD: S/p PCI, DES x 2 overlapping-RCA in 2017.  She has stable mild dyspnea on exertion, unchanged from prior visits, otherwise, stable with no anginal symptoms. No indication for ischemic evaluation.  Noninvasive studies have been unreliable for assessment with "false negative" nuclear stress test.  Per Dr. Leola Raisin prior notes, not a good candidate for invasive procedures due to inability to cooperate with worsening cognitive defects.  Continue aspirin , metoprolol , Imdur , and Crestor .   2. Chronic heart failure with mildly reduced EF:  Most recent echo in 09/2022 showed EF 55 to 60%, normally complete, no RWMA, G1 DD, normal RV, mild mitral valve regurgitation, mild aortic valve regurgitation, moderate dilation of the aortic root measuring 42 mm, mild dilation of the ascending aorta measuring 39 mm.  She has stable dependent nonpitting bilateral lower extremity edema, otherwise euvolemic and well compensated on exam.  No indication for loop diuretic  at this time.  Continue metoprolol .   3. History of syncope/orthostatic dizziness: She has a history of syncope.  MRI was unremarkable.  Orthostatics were negative.  Echocardiogram stable as above.  Carotid ultrasound showed 1 to 39% B ICA stenosis.  30-day event monitor showed predominantly sinus rhythm, one 7 beat run of NSVT, rare PVCs.  She denies any recurrent presyncope or syncope, denies any significant dizziness.  Overall stable.  Encouraged adequate hydration, gradual position changes.   4. Mild dilation of aortic root: Most recent echo showed moderate dilation of the aortic root measuring 42 mm, mild dilation of the ascending aorta measuring 39 mm. Stable.  Repeat study not indicated as she is not a good candidate for surgical repair.   5. Hypertension: BP well controlled. Continue current antihypertensive regimen.    6. Hyperlipidemia: LDL was 64 in 11/2022.  Monitored and managed per PCP.  Continue Crestor .     7.  Memory impairment: Stable, monitored per PCP.  8. Disposition: Follow-up in 6 months.         Jude Norton, NP 08/14/2023, 4:21 PM

## 2023-08-14 NOTE — Patient Instructions (Signed)
 Medication Instructions:  Your physician recommends that you continue on your current medications as directed. Please refer to the Current Medication list given to you today.  *If you need a refill on your cardiac medications before your next appointment, please call your pharmacy*  Lab Work: NONE ordered at this time of appointment   Testing/Procedures: NONE ordered at this time of appointment   Follow-Up: At [facility], you and your health needs are our priority.  As part of our continuing mission to provide you with exceptional heart care, our providers are all part of one team.  This team includes your primary Cardiologist (physician) and Advanced Practice Providers or APPs (Physician Assistants and Nurse Practitioners) who all work together to provide you with the care you need, when you need it.  Your next appointment:   6 month(s)  Provider:   Luana Rumple, MD    We recommend signing up for the patient portal called "MyChart".  Sign up information is provided on this After Visit Summary.  MyChart is used to connect with patients for Virtual Visits (Telemedicine).  Patients are able to view lab/test results, encounter notes, upcoming appointments, etc.  Non-urgent messages can be sent to your provider as well.   To learn more about what you can do with MyChart, go to [email or web address].

## 2023-08-16 DIAGNOSIS — R269 Unspecified abnormalities of gait and mobility: Secondary | ICD-10-CM | POA: Diagnosis not present

## 2023-08-16 DIAGNOSIS — M6281 Muscle weakness (generalized): Secondary | ICD-10-CM | POA: Diagnosis not present

## 2023-08-16 DIAGNOSIS — R296 Repeated falls: Secondary | ICD-10-CM | POA: Diagnosis not present

## 2023-08-23 DIAGNOSIS — L905 Scar conditions and fibrosis of skin: Secondary | ICD-10-CM | POA: Diagnosis not present

## 2023-08-23 DIAGNOSIS — D2239 Melanocytic nevi of other parts of face: Secondary | ICD-10-CM | POA: Diagnosis not present

## 2023-08-23 DIAGNOSIS — L821 Other seborrheic keratosis: Secondary | ICD-10-CM | POA: Diagnosis not present

## 2023-08-23 DIAGNOSIS — D224 Melanocytic nevi of scalp and neck: Secondary | ICD-10-CM | POA: Diagnosis not present

## 2023-08-23 DIAGNOSIS — Z85828 Personal history of other malignant neoplasm of skin: Secondary | ICD-10-CM | POA: Diagnosis not present

## 2023-08-23 DIAGNOSIS — Z85820 Personal history of malignant melanoma of skin: Secondary | ICD-10-CM | POA: Diagnosis not present

## 2023-08-23 DIAGNOSIS — D2262 Melanocytic nevi of left upper limb, including shoulder: Secondary | ICD-10-CM | POA: Diagnosis not present

## 2023-08-28 DIAGNOSIS — M6281 Muscle weakness (generalized): Secondary | ICD-10-CM | POA: Diagnosis not present

## 2023-08-28 DIAGNOSIS — R296 Repeated falls: Secondary | ICD-10-CM | POA: Diagnosis not present

## 2023-08-28 DIAGNOSIS — R269 Unspecified abnormalities of gait and mobility: Secondary | ICD-10-CM | POA: Diagnosis not present

## 2023-09-04 DIAGNOSIS — R296 Repeated falls: Secondary | ICD-10-CM | POA: Diagnosis not present

## 2023-09-04 DIAGNOSIS — R2689 Other abnormalities of gait and mobility: Secondary | ICD-10-CM | POA: Diagnosis not present

## 2023-09-04 DIAGNOSIS — E871 Hypo-osmolality and hyponatremia: Secondary | ICD-10-CM | POA: Diagnosis not present

## 2023-09-04 DIAGNOSIS — N179 Acute kidney failure, unspecified: Secondary | ICD-10-CM | POA: Diagnosis not present

## 2023-09-04 DIAGNOSIS — R440 Auditory hallucinations: Secondary | ICD-10-CM | POA: Diagnosis not present

## 2023-09-04 DIAGNOSIS — N3090 Cystitis, unspecified without hematuria: Secondary | ICD-10-CM | POA: Diagnosis not present

## 2023-09-04 DIAGNOSIS — I10 Essential (primary) hypertension: Secondary | ICD-10-CM | POA: Diagnosis not present

## 2023-09-04 DIAGNOSIS — D72825 Bandemia: Secondary | ICD-10-CM | POA: Diagnosis not present

## 2023-09-04 DIAGNOSIS — N390 Urinary tract infection, site not specified: Secondary | ICD-10-CM | POA: Diagnosis not present

## 2023-09-04 DIAGNOSIS — R4189 Other symptoms and signs involving cognitive functions and awareness: Secondary | ICD-10-CM | POA: Diagnosis not present

## 2023-09-07 DIAGNOSIS — D72825 Bandemia: Secondary | ICD-10-CM | POA: Diagnosis not present

## 2023-09-07 DIAGNOSIS — N179 Acute kidney failure, unspecified: Secondary | ICD-10-CM | POA: Diagnosis not present

## 2023-09-07 DIAGNOSIS — E871 Hypo-osmolality and hyponatremia: Secondary | ICD-10-CM | POA: Diagnosis not present

## 2023-09-07 DIAGNOSIS — N3090 Cystitis, unspecified without hematuria: Secondary | ICD-10-CM | POA: Diagnosis not present

## 2023-09-07 DIAGNOSIS — I10 Essential (primary) hypertension: Secondary | ICD-10-CM | POA: Diagnosis not present

## 2023-09-07 DIAGNOSIS — R2689 Other abnormalities of gait and mobility: Secondary | ICD-10-CM | POA: Diagnosis not present

## 2023-09-07 DIAGNOSIS — R4189 Other symptoms and signs involving cognitive functions and awareness: Secondary | ICD-10-CM | POA: Diagnosis not present

## 2023-09-07 DIAGNOSIS — R296 Repeated falls: Secondary | ICD-10-CM | POA: Diagnosis not present

## 2023-09-07 DIAGNOSIS — R440 Auditory hallucinations: Secondary | ICD-10-CM | POA: Diagnosis not present

## 2023-09-25 DIAGNOSIS — M6281 Muscle weakness (generalized): Secondary | ICD-10-CM | POA: Diagnosis not present

## 2023-09-25 DIAGNOSIS — R269 Unspecified abnormalities of gait and mobility: Secondary | ICD-10-CM | POA: Diagnosis not present

## 2023-09-25 DIAGNOSIS — R296 Repeated falls: Secondary | ICD-10-CM | POA: Diagnosis not present

## 2023-09-27 DIAGNOSIS — R269 Unspecified abnormalities of gait and mobility: Secondary | ICD-10-CM | POA: Diagnosis not present

## 2023-09-27 DIAGNOSIS — M6281 Muscle weakness (generalized): Secondary | ICD-10-CM | POA: Diagnosis not present

## 2023-09-27 DIAGNOSIS — R296 Repeated falls: Secondary | ICD-10-CM | POA: Diagnosis not present

## 2023-10-04 DIAGNOSIS — M6281 Muscle weakness (generalized): Secondary | ICD-10-CM | POA: Diagnosis not present

## 2023-10-04 DIAGNOSIS — R269 Unspecified abnormalities of gait and mobility: Secondary | ICD-10-CM | POA: Diagnosis not present

## 2023-10-04 DIAGNOSIS — R296 Repeated falls: Secondary | ICD-10-CM | POA: Diagnosis not present

## 2023-10-09 DIAGNOSIS — R296 Repeated falls: Secondary | ICD-10-CM | POA: Diagnosis not present

## 2023-10-09 DIAGNOSIS — R269 Unspecified abnormalities of gait and mobility: Secondary | ICD-10-CM | POA: Diagnosis not present

## 2023-10-09 DIAGNOSIS — M6281 Muscle weakness (generalized): Secondary | ICD-10-CM | POA: Diagnosis not present

## 2023-10-23 DIAGNOSIS — R269 Unspecified abnormalities of gait and mobility: Secondary | ICD-10-CM | POA: Diagnosis not present

## 2023-10-23 DIAGNOSIS — M6281 Muscle weakness (generalized): Secondary | ICD-10-CM | POA: Diagnosis not present

## 2023-10-23 DIAGNOSIS — R296 Repeated falls: Secondary | ICD-10-CM | POA: Diagnosis not present

## 2023-10-25 DIAGNOSIS — R296 Repeated falls: Secondary | ICD-10-CM | POA: Diagnosis not present

## 2023-10-25 DIAGNOSIS — M6281 Muscle weakness (generalized): Secondary | ICD-10-CM | POA: Diagnosis not present

## 2023-10-25 DIAGNOSIS — R269 Unspecified abnormalities of gait and mobility: Secondary | ICD-10-CM | POA: Diagnosis not present

## 2023-11-05 DIAGNOSIS — N179 Acute kidney failure, unspecified: Secondary | ICD-10-CM | POA: Diagnosis not present

## 2023-11-05 DIAGNOSIS — D72825 Bandemia: Secondary | ICD-10-CM | POA: Diagnosis not present

## 2023-11-05 DIAGNOSIS — R440 Auditory hallucinations: Secondary | ICD-10-CM | POA: Diagnosis not present

## 2023-11-05 DIAGNOSIS — R2689 Other abnormalities of gait and mobility: Secondary | ICD-10-CM | POA: Diagnosis not present

## 2023-11-05 DIAGNOSIS — N3090 Cystitis, unspecified without hematuria: Secondary | ICD-10-CM | POA: Diagnosis not present

## 2023-11-05 DIAGNOSIS — R4189 Other symptoms and signs involving cognitive functions and awareness: Secondary | ICD-10-CM | POA: Diagnosis not present

## 2023-11-05 DIAGNOSIS — I10 Essential (primary) hypertension: Secondary | ICD-10-CM | POA: Diagnosis not present

## 2023-11-05 DIAGNOSIS — R296 Repeated falls: Secondary | ICD-10-CM | POA: Diagnosis not present

## 2023-11-05 DIAGNOSIS — Z1389 Encounter for screening for other disorder: Secondary | ICD-10-CM | POA: Diagnosis not present

## 2023-11-05 DIAGNOSIS — N390 Urinary tract infection, site not specified: Secondary | ICD-10-CM | POA: Diagnosis not present

## 2023-11-05 DIAGNOSIS — E871 Hypo-osmolality and hyponatremia: Secondary | ICD-10-CM | POA: Diagnosis not present

## 2023-11-07 ENCOUNTER — Other Ambulatory Visit: Payer: Self-pay | Admitting: Nurse Practitioner

## 2023-11-07 DIAGNOSIS — R440 Auditory hallucinations: Secondary | ICD-10-CM

## 2023-11-18 ENCOUNTER — Ambulatory Visit
Admission: RE | Admit: 2023-11-18 | Discharge: 2023-11-18 | Disposition: A | Discharge status: Home / Self Care | Source: Ambulatory Visit | Attending: Nurse Practitioner | Admitting: Nurse Practitioner

## 2023-11-18 DIAGNOSIS — R410 Disorientation, unspecified: Secondary | ICD-10-CM | POA: Diagnosis not present

## 2023-11-18 DIAGNOSIS — R519 Headache, unspecified: Secondary | ICD-10-CM | POA: Diagnosis not present

## 2023-11-18 DIAGNOSIS — R440 Auditory hallucinations: Secondary | ICD-10-CM

## 2023-11-18 DIAGNOSIS — G9389 Other specified disorders of brain: Secondary | ICD-10-CM | POA: Diagnosis not present

## 2023-12-03 DIAGNOSIS — E785 Hyperlipidemia, unspecified: Secondary | ICD-10-CM | POA: Diagnosis not present

## 2023-12-03 DIAGNOSIS — I5042 Chronic combined systolic (congestive) and diastolic (congestive) heart failure: Secondary | ICD-10-CM | POA: Diagnosis not present

## 2023-12-03 DIAGNOSIS — I110 Hypertensive heart disease with heart failure: Secondary | ICD-10-CM | POA: Diagnosis not present

## 2023-12-03 DIAGNOSIS — E039 Hypothyroidism, unspecified: Secondary | ICD-10-CM | POA: Diagnosis not present

## 2023-12-10 DIAGNOSIS — G308 Other Alzheimer's disease: Secondary | ICD-10-CM | POA: Diagnosis not present

## 2023-12-10 DIAGNOSIS — Z1331 Encounter for screening for depression: Secondary | ICD-10-CM | POA: Diagnosis not present

## 2023-12-10 DIAGNOSIS — M159 Polyosteoarthritis, unspecified: Secondary | ICD-10-CM | POA: Diagnosis not present

## 2023-12-10 DIAGNOSIS — I10 Essential (primary) hypertension: Secondary | ICD-10-CM | POA: Diagnosis not present

## 2023-12-10 DIAGNOSIS — E039 Hypothyroidism, unspecified: Secondary | ICD-10-CM | POA: Diagnosis not present

## 2023-12-10 DIAGNOSIS — I25118 Atherosclerotic heart disease of native coronary artery with other forms of angina pectoris: Secondary | ICD-10-CM | POA: Diagnosis not present

## 2023-12-10 DIAGNOSIS — Z1339 Encounter for screening examination for other mental health and behavioral disorders: Secondary | ICD-10-CM | POA: Diagnosis not present

## 2023-12-10 DIAGNOSIS — E785 Hyperlipidemia, unspecified: Secondary | ICD-10-CM | POA: Diagnosis not present

## 2023-12-10 DIAGNOSIS — Z0000 Encounter for general adult medical examination without abnormal findings: Secondary | ICD-10-CM | POA: Diagnosis not present

## 2023-12-10 DIAGNOSIS — Z23 Encounter for immunization: Secondary | ICD-10-CM | POA: Diagnosis not present

## 2023-12-10 DIAGNOSIS — R2681 Unsteadiness on feet: Secondary | ICD-10-CM | POA: Diagnosis not present

## 2023-12-10 DIAGNOSIS — F02A0 Dementia in other diseases classified elsewhere, mild, without behavioral disturbance, psychotic disturbance, mood disturbance, and anxiety: Secondary | ICD-10-CM | POA: Diagnosis not present

## 2024-01-15 DIAGNOSIS — R350 Frequency of micturition: Secondary | ICD-10-CM | POA: Diagnosis not present

## 2024-01-15 DIAGNOSIS — R413 Other amnesia: Secondary | ICD-10-CM | POA: Diagnosis not present

## 2024-01-15 DIAGNOSIS — R339 Retention of urine, unspecified: Secondary | ICD-10-CM | POA: Diagnosis not present

## 2024-01-15 DIAGNOSIS — N390 Urinary tract infection, site not specified: Secondary | ICD-10-CM | POA: Diagnosis not present

## 2024-02-25 ENCOUNTER — Other Ambulatory Visit: Payer: Self-pay | Admitting: Cardiovascular Disease

## 2024-03-27 ENCOUNTER — Other Ambulatory Visit: Payer: Self-pay | Admitting: Cardiovascular Disease
